# Patient Record
Sex: Male | Born: 1940 | Race: Black or African American | Hispanic: No | Marital: Married | Smoking: Former smoker
Health system: Southern US, Community
[De-identification: ages and names within clinical notes are randomized; demographics above are authoritative.]

## PROBLEM LIST (undated history)

## (undated) ENCOUNTER — Emergency Department (HOSPITAL_COMMUNITY): Admission: EM | Payer: PPO | Source: Home / Self Care

## (undated) DIAGNOSIS — I10 Essential (primary) hypertension: Secondary | ICD-10-CM

## (undated) DIAGNOSIS — E119 Type 2 diabetes mellitus without complications: Secondary | ICD-10-CM

## (undated) DIAGNOSIS — I671 Cerebral aneurysm, nonruptured: Secondary | ICD-10-CM

## (undated) DIAGNOSIS — I509 Heart failure, unspecified: Secondary | ICD-10-CM

## (undated) DIAGNOSIS — F329 Major depressive disorder, single episode, unspecified: Secondary | ICD-10-CM

## (undated) DIAGNOSIS — IMO0002 Reserved for concepts with insufficient information to code with codable children: Secondary | ICD-10-CM

## (undated) DIAGNOSIS — G459 Transient cerebral ischemic attack, unspecified: Secondary | ICD-10-CM

## (undated) DIAGNOSIS — I639 Cerebral infarction, unspecified: Secondary | ICD-10-CM

## (undated) DIAGNOSIS — M1990 Unspecified osteoarthritis, unspecified site: Secondary | ICD-10-CM

## (undated) DIAGNOSIS — G4730 Sleep apnea, unspecified: Secondary | ICD-10-CM

## (undated) DIAGNOSIS — E785 Hyperlipidemia, unspecified: Secondary | ICD-10-CM

## (undated) DIAGNOSIS — F32A Depression, unspecified: Secondary | ICD-10-CM

## (undated) HISTORY — DX: Reserved for concepts with insufficient information to code with codable children: IMO0002

## (undated) HISTORY — DX: Major depressive disorder, single episode, unspecified: F32.9

## (undated) HISTORY — DX: Depression, unspecified: F32.A

## (undated) HISTORY — DX: Cerebral aneurysm, nonruptured: I67.1

## (undated) HISTORY — DX: Hyperlipidemia, unspecified: E78.5

## (undated) HISTORY — DX: Heart failure, unspecified: I50.9

## (undated) HISTORY — PX: KNEE ARTHROSCOPY: SUR90

## (undated) HISTORY — PX: OTHER SURGICAL HISTORY: SHX169

## (undated) HISTORY — PX: SHOULDER ARTHROSCOPY W/ ROTATOR CUFF REPAIR: SHX2400

## (undated) HISTORY — DX: Transient cerebral ischemic attack, unspecified: G45.9

## (undated) HISTORY — DX: Unspecified osteoarthritis, unspecified site: M19.90

---

## 2001-04-22 ENCOUNTER — Ambulatory Visit (HOSPITAL_COMMUNITY): Admission: RE | Admit: 2001-04-22 | Discharge: 2001-04-22 | Payer: Self-pay | Admitting: Internal Medicine

## 2002-11-17 ENCOUNTER — Ambulatory Visit (HOSPITAL_COMMUNITY): Admission: RE | Admit: 2002-11-17 | Discharge: 2002-11-17 | Payer: Self-pay | Admitting: Internal Medicine

## 2005-04-13 ENCOUNTER — Emergency Department (HOSPITAL_COMMUNITY): Admission: EM | Admit: 2005-04-13 | Discharge: 2005-04-13 | Payer: Self-pay | Admitting: Emergency Medicine

## 2005-06-07 ENCOUNTER — Ambulatory Visit: Payer: Self-pay | Admitting: Internal Medicine

## 2005-06-08 ENCOUNTER — Ambulatory Visit: Payer: Self-pay | Admitting: Cardiology

## 2005-06-11 ENCOUNTER — Ambulatory Visit: Payer: Self-pay | Admitting: Internal Medicine

## 2005-06-22 ENCOUNTER — Ambulatory Visit: Payer: Self-pay

## 2005-07-06 ENCOUNTER — Ambulatory Visit: Payer: Self-pay | Admitting: Internal Medicine

## 2005-07-13 ENCOUNTER — Ambulatory Visit: Payer: Self-pay | Admitting: Cardiology

## 2005-07-20 ENCOUNTER — Ambulatory Visit: Payer: Self-pay | Admitting: Pulmonary Disease

## 2005-07-27 ENCOUNTER — Encounter: Admission: RE | Admit: 2005-07-27 | Discharge: 2005-07-27 | Payer: Self-pay | Admitting: Internal Medicine

## 2005-07-29 ENCOUNTER — Ambulatory Visit (HOSPITAL_BASED_OUTPATIENT_CLINIC_OR_DEPARTMENT_OTHER): Admission: RE | Admit: 2005-07-29 | Discharge: 2005-07-29 | Payer: Self-pay | Admitting: Pulmonary Disease

## 2005-07-31 ENCOUNTER — Ambulatory Visit: Payer: Self-pay | Admitting: Pulmonary Disease

## 2005-08-03 ENCOUNTER — Ambulatory Visit: Payer: Self-pay | Admitting: Internal Medicine

## 2005-08-06 ENCOUNTER — Ambulatory Visit: Payer: Self-pay | Admitting: Pulmonary Disease

## 2005-08-14 ENCOUNTER — Ambulatory Visit: Payer: Self-pay | Admitting: Internal Medicine

## 2005-08-14 ENCOUNTER — Ambulatory Visit (HOSPITAL_COMMUNITY): Admission: RE | Admit: 2005-08-14 | Discharge: 2005-08-14 | Payer: Self-pay | Admitting: Internal Medicine

## 2005-08-17 ENCOUNTER — Ambulatory Visit (HOSPITAL_BASED_OUTPATIENT_CLINIC_OR_DEPARTMENT_OTHER): Admission: RE | Admit: 2005-08-17 | Discharge: 2005-08-17 | Payer: Self-pay | Admitting: Pulmonary Disease

## 2005-09-21 ENCOUNTER — Ambulatory Visit: Payer: Self-pay | Admitting: Internal Medicine

## 2005-09-24 ENCOUNTER — Ambulatory Visit: Payer: Self-pay | Admitting: Pulmonary Disease

## 2005-10-19 ENCOUNTER — Ambulatory Visit: Payer: Self-pay | Admitting: Internal Medicine

## 2005-11-09 ENCOUNTER — Ambulatory Visit: Payer: Self-pay | Admitting: Pulmonary Disease

## 2005-11-09 ENCOUNTER — Ambulatory Visit: Payer: Self-pay | Admitting: Internal Medicine

## 2005-11-16 ENCOUNTER — Ambulatory Visit: Payer: Self-pay | Admitting: Internal Medicine

## 2006-01-02 ENCOUNTER — Ambulatory Visit: Payer: Self-pay | Admitting: Endocrinology

## 2006-01-04 ENCOUNTER — Ambulatory Visit (HOSPITAL_COMMUNITY): Admission: RE | Admit: 2006-01-04 | Discharge: 2006-01-04 | Payer: Self-pay | Admitting: Endocrinology

## 2006-01-10 ENCOUNTER — Emergency Department (HOSPITAL_COMMUNITY): Admission: EM | Admit: 2006-01-10 | Discharge: 2006-01-11 | Payer: Self-pay | Admitting: Emergency Medicine

## 2006-01-22 ENCOUNTER — Ambulatory Visit: Payer: Self-pay | Admitting: Internal Medicine

## 2006-02-12 ENCOUNTER — Ambulatory Visit: Payer: Self-pay | Admitting: Internal Medicine

## 2006-02-22 ENCOUNTER — Ambulatory Visit: Payer: Self-pay | Admitting: Pulmonary Disease

## 2006-04-16 ENCOUNTER — Ambulatory Visit: Payer: Self-pay | Admitting: Internal Medicine

## 2006-04-16 ENCOUNTER — Inpatient Hospital Stay (HOSPITAL_COMMUNITY): Admission: AD | Admit: 2006-04-16 | Discharge: 2006-04-19 | Payer: Self-pay | Admitting: Internal Medicine

## 2006-04-18 ENCOUNTER — Encounter (INDEPENDENT_AMBULATORY_CARE_PROVIDER_SITE_OTHER): Payer: Self-pay | Admitting: *Deleted

## 2006-04-22 ENCOUNTER — Emergency Department (HOSPITAL_COMMUNITY): Admission: EM | Admit: 2006-04-22 | Discharge: 2006-04-22 | Payer: Self-pay | Admitting: Emergency Medicine

## 2006-04-25 ENCOUNTER — Ambulatory Visit: Payer: Self-pay | Admitting: Hospitalist

## 2006-05-01 ENCOUNTER — Ambulatory Visit: Payer: Self-pay | Admitting: Internal Medicine

## 2006-05-23 ENCOUNTER — Ambulatory Visit: Payer: Self-pay | Admitting: Internal Medicine

## 2006-06-11 ENCOUNTER — Ambulatory Visit: Payer: Self-pay | Admitting: Hospitalist

## 2006-06-11 ENCOUNTER — Ambulatory Visit (HOSPITAL_COMMUNITY): Admission: RE | Admit: 2006-06-11 | Discharge: 2006-06-11 | Payer: Self-pay | Admitting: Hospitalist

## 2006-06-21 ENCOUNTER — Ambulatory Visit: Payer: Self-pay | Admitting: Hospitalist

## 2006-07-05 ENCOUNTER — Ambulatory Visit (HOSPITAL_COMMUNITY): Admission: RE | Admit: 2006-07-05 | Discharge: 2006-07-05 | Payer: Self-pay | Admitting: Internal Medicine

## 2006-07-16 ENCOUNTER — Ambulatory Visit: Payer: Self-pay | Admitting: Internal Medicine

## 2006-07-19 ENCOUNTER — Ambulatory Visit (HOSPITAL_COMMUNITY): Admission: RE | Admit: 2006-07-19 | Discharge: 2006-07-19 | Payer: Self-pay | Admitting: Internal Medicine

## 2006-07-23 ENCOUNTER — Ambulatory Visit: Payer: Self-pay | Admitting: Internal Medicine

## 2006-08-28 ENCOUNTER — Ambulatory Visit: Payer: Self-pay | Admitting: Hospitalist

## 2006-08-28 ENCOUNTER — Encounter (INDEPENDENT_AMBULATORY_CARE_PROVIDER_SITE_OTHER): Payer: Self-pay | Admitting: Internal Medicine

## 2006-08-28 LAB — CONVERTED CEMR LAB
BUN: 13 mg/dL (ref 6–23)
Calcium: 9.6 mg/dL (ref 8.4–10.5)
Creatinine, Ser: 1.08 mg/dL (ref 0.40–1.50)
Glucose, Bld: 183 mg/dL — ABNORMAL HIGH (ref 70–99)
Potassium: 4.3 meq/L (ref 3.5–5.3)

## 2006-09-02 ENCOUNTER — Encounter (INDEPENDENT_AMBULATORY_CARE_PROVIDER_SITE_OTHER): Payer: Self-pay | Admitting: Internal Medicine

## 2006-09-23 DIAGNOSIS — M545 Low back pain, unspecified: Secondary | ICD-10-CM | POA: Insufficient documentation

## 2006-09-23 DIAGNOSIS — E1149 Type 2 diabetes mellitus with other diabetic neurological complication: Secondary | ICD-10-CM | POA: Insufficient documentation

## 2006-09-23 DIAGNOSIS — I10 Essential (primary) hypertension: Secondary | ICD-10-CM

## 2006-09-30 ENCOUNTER — Ambulatory Visit: Payer: Self-pay | Admitting: Internal Medicine

## 2006-10-01 ENCOUNTER — Telehealth (INDEPENDENT_AMBULATORY_CARE_PROVIDER_SITE_OTHER): Payer: Self-pay | Admitting: Internal Medicine

## 2006-10-14 ENCOUNTER — Encounter (INDEPENDENT_AMBULATORY_CARE_PROVIDER_SITE_OTHER): Payer: Self-pay | Admitting: Internal Medicine

## 2006-10-14 ENCOUNTER — Ambulatory Visit: Payer: Self-pay | Admitting: Internal Medicine

## 2006-10-14 LAB — CONVERTED CEMR LAB
Calcium: 9.4 mg/dL (ref 8.4–10.5)
Glucose, Bld: 201 mg/dL — ABNORMAL HIGH (ref 70–99)
Potassium: 4.4 meq/L (ref 3.5–5.3)
Sodium: 146 meq/L — ABNORMAL HIGH (ref 135–145)

## 2006-10-16 DIAGNOSIS — E269 Hyperaldosteronism, unspecified: Secondary | ICD-10-CM | POA: Insufficient documentation

## 2006-10-16 DIAGNOSIS — G4733 Obstructive sleep apnea (adult) (pediatric): Secondary | ICD-10-CM

## 2006-10-28 ENCOUNTER — Ambulatory Visit: Payer: Self-pay | Admitting: Internal Medicine

## 2006-10-28 DIAGNOSIS — K219 Gastro-esophageal reflux disease without esophagitis: Secondary | ICD-10-CM

## 2006-12-02 ENCOUNTER — Encounter (INDEPENDENT_AMBULATORY_CARE_PROVIDER_SITE_OTHER): Payer: Self-pay | Admitting: Internal Medicine

## 2006-12-02 ENCOUNTER — Emergency Department (HOSPITAL_COMMUNITY): Admission: EM | Admit: 2006-12-02 | Discharge: 2006-12-02 | Payer: Self-pay | Admitting: Emergency Medicine

## 2007-01-14 ENCOUNTER — Encounter (INDEPENDENT_AMBULATORY_CARE_PROVIDER_SITE_OTHER): Payer: Self-pay | Admitting: Internal Medicine

## 2007-01-14 ENCOUNTER — Ambulatory Visit: Payer: Self-pay | Admitting: Internal Medicine

## 2007-01-14 DIAGNOSIS — R609 Edema, unspecified: Secondary | ICD-10-CM

## 2007-01-14 LAB — CONVERTED CEMR LAB
Blood Glucose, Fingerstick: 155
Hgb A1c MFr Bld: 6.9 %

## 2007-01-21 ENCOUNTER — Encounter (INDEPENDENT_AMBULATORY_CARE_PROVIDER_SITE_OTHER): Payer: Self-pay | Admitting: Internal Medicine

## 2007-01-21 LAB — CONVERTED CEMR LAB
AST: 12 units/L (ref 0–37)
Albumin: 4.2 g/dL (ref 3.5–5.2)
BUN: 20 mg/dL (ref 6–23)
Basophils Relative: 1 % (ref 0–1)
CO2: 27 meq/L (ref 19–32)
Calcium: 9.5 mg/dL (ref 8.4–10.5)
Chloride: 104 meq/L (ref 96–112)
Creatinine, Ser: 1.12 mg/dL (ref 0.40–1.50)
Hemoglobin, Urine: NEGATIVE
Hemoglobin: 13.4 g/dL (ref 13.0–17.0)
Ketones, ur: NEGATIVE mg/dL
Lymphocytes Relative: 37 % (ref 12–46)
Lymphs Abs: 2.6 10*3/uL (ref 0.7–3.3)
Monocytes Absolute: 0.6 10*3/uL (ref 0.2–0.7)
Monocytes Relative: 8 % (ref 3–11)
Neutro Abs: 3.7 10*3/uL (ref 1.7–7.7)
Neutrophils Relative %: 52 % (ref 43–77)
PSA: 2.21 ng/mL (ref 0.10–4.00)
Potassium: 4.2 meq/L (ref 3.5–5.3)
Protein, ur: NEGATIVE mg/dL
RBC: 5.5 M/uL (ref 4.22–5.81)
TSH: 2.137 microintl units/mL (ref 0.350–5.50)
Urine Glucose: NEGATIVE mg/dL
WBC: 7.1 10*3/uL (ref 4.0–10.5)
pH: 6 (ref 5.0–8.0)

## 2007-01-30 ENCOUNTER — Encounter (INDEPENDENT_AMBULATORY_CARE_PROVIDER_SITE_OTHER): Payer: Self-pay | Admitting: Internal Medicine

## 2007-01-30 ENCOUNTER — Ambulatory Visit (HOSPITAL_COMMUNITY): Admission: RE | Admit: 2007-01-30 | Discharge: 2007-01-30 | Payer: Self-pay | Admitting: Internal Medicine

## 2007-01-30 ENCOUNTER — Ambulatory Visit: Payer: Self-pay | Admitting: Internal Medicine

## 2007-01-30 DIAGNOSIS — R5383 Other fatigue: Secondary | ICD-10-CM

## 2007-01-30 DIAGNOSIS — R5381 Other malaise: Secondary | ICD-10-CM | POA: Insufficient documentation

## 2007-01-31 LAB — CONVERTED CEMR LAB
Creatinine, Urine: 185.5 mg/dL
Microalb Creat Ratio: 33.9 mg/g — ABNORMAL HIGH (ref 0.0–30.0)

## 2007-02-04 ENCOUNTER — Encounter: Admission: RE | Admit: 2007-02-04 | Discharge: 2007-02-14 | Payer: Self-pay | Admitting: Internal Medicine

## 2007-02-21 ENCOUNTER — Encounter (INDEPENDENT_AMBULATORY_CARE_PROVIDER_SITE_OTHER): Payer: Self-pay | Admitting: Internal Medicine

## 2007-02-21 ENCOUNTER — Ambulatory Visit: Payer: Self-pay | Admitting: Internal Medicine

## 2007-03-19 ENCOUNTER — Telehealth (INDEPENDENT_AMBULATORY_CARE_PROVIDER_SITE_OTHER): Payer: Self-pay | Admitting: Pharmacy Technician

## 2007-03-28 ENCOUNTER — Ambulatory Visit: Payer: Self-pay | Admitting: Internal Medicine

## 2007-03-28 DIAGNOSIS — B353 Tinea pedis: Secondary | ICD-10-CM | POA: Insufficient documentation

## 2007-03-28 LAB — CONVERTED CEMR LAB
HDL: 33 mg/dL
LDL Cholesterol: 73 mg/dL
Triglyceride fasting, serum: 133 mg/dL

## 2007-04-08 ENCOUNTER — Encounter: Payer: Self-pay | Admitting: Licensed Clinical Social Worker

## 2007-05-06 ENCOUNTER — Telehealth (INDEPENDENT_AMBULATORY_CARE_PROVIDER_SITE_OTHER): Payer: Self-pay | Admitting: Pharmacy Technician

## 2007-05-29 ENCOUNTER — Ambulatory Visit: Payer: Self-pay | Admitting: Internal Medicine

## 2007-06-06 ENCOUNTER — Ambulatory Visit (HOSPITAL_COMMUNITY): Admission: RE | Admit: 2007-06-06 | Discharge: 2007-06-06 | Payer: Self-pay | Admitting: Internal Medicine

## 2007-06-06 ENCOUNTER — Encounter (INDEPENDENT_AMBULATORY_CARE_PROVIDER_SITE_OTHER): Payer: Self-pay | Admitting: Internal Medicine

## 2007-06-06 ENCOUNTER — Ambulatory Visit: Payer: Self-pay | Admitting: Vascular Surgery

## 2007-06-13 ENCOUNTER — Ambulatory Visit: Payer: Self-pay | Admitting: Internal Medicine

## 2007-06-13 ENCOUNTER — Encounter (INDEPENDENT_AMBULATORY_CARE_PROVIDER_SITE_OTHER): Payer: Self-pay | Admitting: *Deleted

## 2007-06-13 LAB — CONVERTED CEMR LAB
BUN: 15 mg/dL (ref 6–23)
CO2: 29 meq/L (ref 19–32)
Chloride: 104 meq/L (ref 96–112)
Glucose, Bld: 188 mg/dL — ABNORMAL HIGH (ref 70–99)
Potassium: 4.2 meq/L (ref 3.5–5.3)
Sodium: 143 meq/L (ref 135–145)

## 2007-06-20 ENCOUNTER — Ambulatory Visit: Payer: Self-pay | Admitting: Internal Medicine

## 2007-06-23 ENCOUNTER — Telehealth (INDEPENDENT_AMBULATORY_CARE_PROVIDER_SITE_OTHER): Payer: Self-pay | Admitting: Pharmacy Technician

## 2007-07-07 ENCOUNTER — Telehealth (INDEPENDENT_AMBULATORY_CARE_PROVIDER_SITE_OTHER): Payer: Self-pay | Admitting: Pharmacy Technician

## 2007-07-11 ENCOUNTER — Encounter (INDEPENDENT_AMBULATORY_CARE_PROVIDER_SITE_OTHER): Payer: Self-pay | Admitting: Internal Medicine

## 2007-07-11 ENCOUNTER — Telehealth (INDEPENDENT_AMBULATORY_CARE_PROVIDER_SITE_OTHER): Payer: Self-pay | Admitting: Internal Medicine

## 2007-07-11 ENCOUNTER — Ambulatory Visit: Payer: Self-pay | Admitting: Internal Medicine

## 2007-07-11 LAB — CONVERTED CEMR LAB
BUN: 19 mg/dL (ref 6–23)
CO2: 31 meq/L (ref 19–32)
Calcium: 9.1 mg/dL (ref 8.4–10.5)
Creatinine, Ser: 1.31 mg/dL (ref 0.40–1.50)
Glucose, Bld: 164 mg/dL — ABNORMAL HIGH (ref 70–99)

## 2007-07-18 ENCOUNTER — Ambulatory Visit: Payer: Self-pay | Admitting: *Deleted

## 2007-07-18 ENCOUNTER — Encounter (INDEPENDENT_AMBULATORY_CARE_PROVIDER_SITE_OTHER): Payer: Self-pay | Admitting: Infectious Diseases

## 2007-07-18 DIAGNOSIS — R42 Dizziness and giddiness: Secondary | ICD-10-CM

## 2007-07-19 ENCOUNTER — Encounter: Payer: Self-pay | Admitting: *Deleted

## 2007-07-24 ENCOUNTER — Encounter (INDEPENDENT_AMBULATORY_CARE_PROVIDER_SITE_OTHER): Payer: Self-pay | Admitting: Infectious Diseases

## 2007-07-24 LAB — CONVERTED CEMR LAB
BUN: 24 mg/dL — ABNORMAL HIGH (ref 6–23)
Basophils Absolute: 0 10*3/uL (ref 0.0–0.1)
Basophils Relative: 1 % (ref 0–1)
Eosinophils Absolute: 0.2 10*3/uL (ref 0.0–0.7)
MCHC: 29.3 g/dL — ABNORMAL LOW (ref 30.0–36.0)
MCV: 86.1 fL (ref 78.0–100.0)
Monocytes Relative: 8 % (ref 3–11)
Neutrophils Relative %: 55 % (ref 43–77)
Platelets: 244 10*3/uL (ref 150–400)
Potassium: 4.8 meq/L (ref 3.5–5.3)
RBC: 5.75 M/uL (ref 4.22–5.81)
RDW: 15.4 % — ABNORMAL HIGH (ref 11.5–14.0)

## 2007-07-25 ENCOUNTER — Encounter: Admission: RE | Admit: 2007-07-25 | Discharge: 2007-07-25 | Payer: Self-pay | Admitting: Infectious Diseases

## 2007-07-25 ENCOUNTER — Ambulatory Visit: Payer: Self-pay | Admitting: Infectious Diseases

## 2007-07-25 ENCOUNTER — Encounter (INDEPENDENT_AMBULATORY_CARE_PROVIDER_SITE_OTHER): Payer: Self-pay | Admitting: Internal Medicine

## 2007-07-25 ENCOUNTER — Ambulatory Visit: Payer: Self-pay | Admitting: Cardiology

## 2007-07-25 ENCOUNTER — Inpatient Hospital Stay (HOSPITAL_COMMUNITY): Admission: RE | Admit: 2007-07-25 | Discharge: 2007-07-28 | Payer: Self-pay | Admitting: Infectious Diseases

## 2007-07-25 ENCOUNTER — Ambulatory Visit: Payer: Self-pay | Admitting: Internal Medicine

## 2007-07-28 ENCOUNTER — Ambulatory Visit (HOSPITAL_COMMUNITY): Admission: RE | Admit: 2007-07-28 | Discharge: 2007-07-28 | Payer: Self-pay | Admitting: Infectious Diseases

## 2007-07-28 ENCOUNTER — Encounter (INDEPENDENT_AMBULATORY_CARE_PROVIDER_SITE_OTHER): Payer: Self-pay | Admitting: Internal Medicine

## 2007-08-11 ENCOUNTER — Encounter: Admission: RE | Admit: 2007-08-11 | Discharge: 2007-09-09 | Payer: Self-pay | Admitting: Internal Medicine

## 2007-08-13 ENCOUNTER — Ambulatory Visit: Payer: Self-pay | Admitting: Infectious Diseases

## 2007-08-13 LAB — CONVERTED CEMR LAB: Blood Glucose, Fingerstick: 330

## 2007-08-22 ENCOUNTER — Encounter (INDEPENDENT_AMBULATORY_CARE_PROVIDER_SITE_OTHER): Payer: Self-pay | Admitting: Infectious Diseases

## 2007-08-22 ENCOUNTER — Ambulatory Visit: Payer: Self-pay | Admitting: *Deleted

## 2007-08-27 LAB — CONVERTED CEMR LAB
CO2: 30 meq/L (ref 19–32)
Calcium: 9.6 mg/dL (ref 8.4–10.5)
Sodium: 141 meq/L (ref 135–145)

## 2007-09-05 ENCOUNTER — Encounter (INDEPENDENT_AMBULATORY_CARE_PROVIDER_SITE_OTHER): Payer: Self-pay | Admitting: Internal Medicine

## 2007-09-05 ENCOUNTER — Ambulatory Visit: Payer: Self-pay | Admitting: Internal Medicine

## 2007-09-07 LAB — CONVERTED CEMR LAB
Microalb Creat Ratio: 8.1 mg/g (ref 0.0–30.0)
Microalb, Ur: 0.72 mg/dL (ref 0.00–1.89)

## 2007-09-09 LAB — CONVERTED CEMR LAB: OCCULT 1: NEGATIVE

## 2007-09-17 ENCOUNTER — Telehealth (INDEPENDENT_AMBULATORY_CARE_PROVIDER_SITE_OTHER): Payer: Self-pay | Admitting: Internal Medicine

## 2007-09-19 ENCOUNTER — Encounter (INDEPENDENT_AMBULATORY_CARE_PROVIDER_SITE_OTHER): Payer: Self-pay | Admitting: Internal Medicine

## 2007-09-19 ENCOUNTER — Ambulatory Visit: Payer: Self-pay | Admitting: Hospitalist

## 2007-09-20 LAB — CONVERTED CEMR LAB
BUN: 25 mg/dL — ABNORMAL HIGH (ref 6–23)
CO2: 27 meq/L (ref 19–32)
Chloride: 104 meq/L (ref 96–112)
Creatinine, Ser: 1.5 mg/dL (ref 0.40–1.50)

## 2007-10-16 DIAGNOSIS — I639 Cerebral infarction, unspecified: Secondary | ICD-10-CM

## 2007-10-16 HISTORY — DX: Cerebral infarction, unspecified: I63.9

## 2007-11-03 ENCOUNTER — Encounter (INDEPENDENT_AMBULATORY_CARE_PROVIDER_SITE_OTHER): Payer: Self-pay | Admitting: Internal Medicine

## 2007-11-06 ENCOUNTER — Telehealth (INDEPENDENT_AMBULATORY_CARE_PROVIDER_SITE_OTHER): Payer: Self-pay | Admitting: Pharmacy Technician

## 2007-11-10 ENCOUNTER — Telehealth (INDEPENDENT_AMBULATORY_CARE_PROVIDER_SITE_OTHER): Payer: Self-pay | Admitting: Internal Medicine

## 2007-12-09 ENCOUNTER — Telehealth (INDEPENDENT_AMBULATORY_CARE_PROVIDER_SITE_OTHER): Payer: Self-pay | Admitting: Internal Medicine

## 2008-01-02 ENCOUNTER — Encounter (INDEPENDENT_AMBULATORY_CARE_PROVIDER_SITE_OTHER): Payer: Self-pay | Admitting: Internal Medicine

## 2008-01-09 ENCOUNTER — Encounter (INDEPENDENT_AMBULATORY_CARE_PROVIDER_SITE_OTHER): Payer: Self-pay | Admitting: Internal Medicine

## 2008-01-09 ENCOUNTER — Ambulatory Visit: Payer: Self-pay | Admitting: Hospitalist

## 2008-01-16 ENCOUNTER — Telehealth (INDEPENDENT_AMBULATORY_CARE_PROVIDER_SITE_OTHER): Payer: Self-pay | Admitting: Internal Medicine

## 2008-01-31 ENCOUNTER — Emergency Department (HOSPITAL_COMMUNITY): Admission: EM | Admit: 2008-01-31 | Discharge: 2008-02-01 | Payer: Self-pay | Admitting: Emergency Medicine

## 2008-02-10 ENCOUNTER — Ambulatory Visit: Payer: Self-pay | Admitting: Internal Medicine

## 2008-02-10 ENCOUNTER — Encounter (INDEPENDENT_AMBULATORY_CARE_PROVIDER_SITE_OTHER): Payer: Self-pay | Admitting: Internal Medicine

## 2008-02-10 LAB — CONVERTED CEMR LAB: Blood Glucose, Fingerstick: 119

## 2008-02-12 DIAGNOSIS — E785 Hyperlipidemia, unspecified: Secondary | ICD-10-CM

## 2008-02-12 DIAGNOSIS — E1169 Type 2 diabetes mellitus with other specified complication: Secondary | ICD-10-CM

## 2008-02-12 LAB — CONVERTED CEMR LAB
ALT: 20 U/L
AST: 13 U/L
Albumin: 4.1 g/dL
Alkaline Phosphatase: 61 U/L
BUN: 18 mg/dL
CO2: 28 meq/L
Calcium: 9.5 mg/dL
Chloride: 107 meq/L
Cholesterol: 181 mg/dL
Creatinine, Ser: 1.33 mg/dL
Glucose, Bld: 114 mg/dL — ABNORMAL HIGH
HCT: 43.1 %
HDL: 32 mg/dL — ABNORMAL LOW
Hemoglobin: 12.8 g/dL — ABNORMAL LOW
LDL Cholesterol: 125 mg/dL — ABNORMAL HIGH
MCHC: 29.7 g/dL — ABNORMAL LOW
MCV: 86.9 fL
Platelets: 259 10*3/uL
Potassium: 4.9 meq/L
RBC: 4.96 M/uL
RDW: 15.3 %
Sodium: 146 meq/L — ABNORMAL HIGH
Total Bilirubin: 0.5 mg/dL
Total CHOL/HDL Ratio: 5.7
Total Protein: 7.4 g/dL
Triglycerides: 122 mg/dL
VLDL: 24 mg/dL
WBC: 6.9 10*3/uL

## 2008-02-13 ENCOUNTER — Telehealth (INDEPENDENT_AMBULATORY_CARE_PROVIDER_SITE_OTHER): Payer: Self-pay | Admitting: *Deleted

## 2008-02-13 ENCOUNTER — Encounter (INDEPENDENT_AMBULATORY_CARE_PROVIDER_SITE_OTHER): Payer: Self-pay | Admitting: Internal Medicine

## 2008-03-02 ENCOUNTER — Telehealth (INDEPENDENT_AMBULATORY_CARE_PROVIDER_SITE_OTHER): Payer: Self-pay | Admitting: Internal Medicine

## 2008-03-12 ENCOUNTER — Ambulatory Visit: Payer: Self-pay | Admitting: *Deleted

## 2008-03-12 ENCOUNTER — Encounter (INDEPENDENT_AMBULATORY_CARE_PROVIDER_SITE_OTHER): Payer: Self-pay | Admitting: Internal Medicine

## 2008-03-20 ENCOUNTER — Ambulatory Visit: Payer: Self-pay | Admitting: Hospitalist

## 2008-03-20 ENCOUNTER — Inpatient Hospital Stay (HOSPITAL_COMMUNITY): Admission: EM | Admit: 2008-03-20 | Discharge: 2008-03-25 | Payer: Self-pay | Admitting: Emergency Medicine

## 2008-03-22 ENCOUNTER — Encounter (INDEPENDENT_AMBULATORY_CARE_PROVIDER_SITE_OTHER): Payer: Self-pay | Admitting: Emergency Medicine

## 2008-03-22 ENCOUNTER — Encounter (INDEPENDENT_AMBULATORY_CARE_PROVIDER_SITE_OTHER): Payer: Self-pay | Admitting: Internal Medicine

## 2008-03-22 ENCOUNTER — Ambulatory Visit: Payer: Self-pay | Admitting: Surgery

## 2008-03-22 ENCOUNTER — Encounter (INDEPENDENT_AMBULATORY_CARE_PROVIDER_SITE_OTHER): Payer: Self-pay | Admitting: Hospitalist

## 2008-04-01 ENCOUNTER — Encounter: Payer: Self-pay | Admitting: Internal Medicine

## 2008-04-01 ENCOUNTER — Ambulatory Visit: Payer: Self-pay | Admitting: Internal Medicine

## 2008-04-01 LAB — CONVERTED CEMR LAB
BUN: 24 mg/dL — ABNORMAL HIGH (ref 6–23)
Chloride: 103 meq/L (ref 96–112)
Hgb A1c MFr Bld: 7.7 %
Potassium: 4.3 meq/L (ref 3.5–5.3)

## 2008-04-02 ENCOUNTER — Encounter (INDEPENDENT_AMBULATORY_CARE_PROVIDER_SITE_OTHER): Payer: Self-pay | Admitting: Internal Medicine

## 2008-04-06 ENCOUNTER — Encounter (INDEPENDENT_AMBULATORY_CARE_PROVIDER_SITE_OTHER): Payer: Self-pay | Admitting: Internal Medicine

## 2008-04-23 ENCOUNTER — Ambulatory Visit (HOSPITAL_COMMUNITY): Admission: RE | Admit: 2008-04-23 | Discharge: 2008-04-23 | Payer: Self-pay | Admitting: Neurosurgery

## 2008-05-12 ENCOUNTER — Encounter (INDEPENDENT_AMBULATORY_CARE_PROVIDER_SITE_OTHER): Payer: Self-pay | Admitting: Internal Medicine

## 2008-05-15 DIAGNOSIS — I671 Cerebral aneurysm, nonruptured: Secondary | ICD-10-CM

## 2008-05-15 HISTORY — PX: CRANIOTOMY: SHX93

## 2008-05-15 HISTORY — DX: Cerebral aneurysm, nonruptured: I67.1

## 2008-05-27 ENCOUNTER — Inpatient Hospital Stay (HOSPITAL_COMMUNITY): Admission: RE | Admit: 2008-05-27 | Discharge: 2008-06-03 | Payer: Self-pay | Admitting: Neurosurgery

## 2008-06-02 ENCOUNTER — Ambulatory Visit: Payer: Self-pay | Admitting: Physical Medicine & Rehabilitation

## 2008-06-03 ENCOUNTER — Inpatient Hospital Stay (HOSPITAL_COMMUNITY)
Admission: RE | Admit: 2008-06-03 | Discharge: 2008-06-12 | Payer: Self-pay | Admitting: Physical Medicine & Rehabilitation

## 2008-06-12 ENCOUNTER — Inpatient Hospital Stay (HOSPITAL_COMMUNITY)
Admission: EM | Admit: 2008-06-12 | Discharge: 2008-06-18 | Payer: Self-pay | Admitting: Physical Medicine & Rehabilitation

## 2008-06-12 ENCOUNTER — Ambulatory Visit: Payer: Self-pay | Admitting: Infectious Diseases

## 2008-06-22 ENCOUNTER — Inpatient Hospital Stay (HOSPITAL_COMMUNITY): Admission: EM | Admit: 2008-06-22 | Discharge: 2008-06-25 | Payer: Self-pay | Admitting: Emergency Medicine

## 2008-08-02 ENCOUNTER — Encounter (INDEPENDENT_AMBULATORY_CARE_PROVIDER_SITE_OTHER): Payer: Self-pay | Admitting: Internal Medicine

## 2008-08-02 ENCOUNTER — Encounter: Admission: RE | Admit: 2008-08-02 | Discharge: 2008-08-02 | Payer: Self-pay | Admitting: Neurosurgery

## 2008-08-18 ENCOUNTER — Encounter (INDEPENDENT_AMBULATORY_CARE_PROVIDER_SITE_OTHER): Payer: Self-pay | Admitting: Internal Medicine

## 2008-08-18 ENCOUNTER — Ambulatory Visit: Payer: Self-pay | Admitting: Infectious Diseases

## 2008-08-18 DIAGNOSIS — I6350 Cerebral infarction due to unspecified occlusion or stenosis of unspecified cerebral artery: Secondary | ICD-10-CM | POA: Insufficient documentation

## 2008-08-18 LAB — CONVERTED CEMR LAB: Blood Glucose, Fingerstick: 332

## 2008-08-19 LAB — CONVERTED CEMR LAB
BUN: 18 mg/dL (ref 6–23)
Basophils Absolute: 0.1 10*3/uL (ref 0.0–0.1)
CO2: 29 meq/L (ref 19–32)
Calcium: 9.3 mg/dL (ref 8.4–10.5)
Creatinine, Ser: 1.12 mg/dL (ref 0.40–1.50)
Eosinophils Relative: 3 % (ref 0–5)
Glucose, Bld: 355 mg/dL — ABNORMAL HIGH (ref 70–99)
HCT: 41 % (ref 39.0–52.0)
Hemoglobin: 12.3 g/dL — ABNORMAL LOW (ref 13.0–17.0)
Lymphocytes Relative: 29 % (ref 12–46)
Lymphs Abs: 2.3 10*3/uL (ref 0.7–4.0)
Monocytes Absolute: 0.6 10*3/uL (ref 0.1–1.0)
Monocytes Relative: 8 % (ref 3–12)
RBC: 4.91 M/uL (ref 4.22–5.81)
RDW: 15.7 % — ABNORMAL HIGH (ref 11.5–15.5)
Total Bilirubin: 0.5 mg/dL (ref 0.3–1.2)
Vit D, 1,25-Dihydroxy: 12 — ABNORMAL LOW (ref 30–89)

## 2008-09-01 ENCOUNTER — Ambulatory Visit: Payer: Self-pay | Admitting: *Deleted

## 2008-09-01 ENCOUNTER — Encounter (INDEPENDENT_AMBULATORY_CARE_PROVIDER_SITE_OTHER): Payer: Self-pay | Admitting: Internal Medicine

## 2008-09-17 ENCOUNTER — Encounter (INDEPENDENT_AMBULATORY_CARE_PROVIDER_SITE_OTHER): Payer: Self-pay | Admitting: Internal Medicine

## 2008-10-22 ENCOUNTER — Encounter (INDEPENDENT_AMBULATORY_CARE_PROVIDER_SITE_OTHER): Payer: Self-pay | Admitting: Internal Medicine

## 2008-11-09 ENCOUNTER — Ambulatory Visit: Payer: Self-pay | Admitting: Infectious Disease

## 2008-11-09 ENCOUNTER — Encounter (INDEPENDENT_AMBULATORY_CARE_PROVIDER_SITE_OTHER): Payer: Self-pay | Admitting: Internal Medicine

## 2008-11-09 DIAGNOSIS — L989 Disorder of the skin and subcutaneous tissue, unspecified: Secondary | ICD-10-CM | POA: Insufficient documentation

## 2008-11-09 DIAGNOSIS — K5900 Constipation, unspecified: Secondary | ICD-10-CM | POA: Insufficient documentation

## 2008-11-09 LAB — CONVERTED CEMR LAB: Hgb A1c MFr Bld: 9.7 %

## 2008-11-18 ENCOUNTER — Encounter (INDEPENDENT_AMBULATORY_CARE_PROVIDER_SITE_OTHER): Payer: Self-pay | Admitting: Internal Medicine

## 2008-11-29 ENCOUNTER — Encounter (INDEPENDENT_AMBULATORY_CARE_PROVIDER_SITE_OTHER): Payer: Self-pay | Admitting: Internal Medicine

## 2008-11-29 ENCOUNTER — Ambulatory Visit: Payer: Self-pay | Admitting: *Deleted

## 2008-11-29 LAB — CONVERTED CEMR LAB
Chloride: 107 meq/L (ref 96–112)
Potassium: 4.3 meq/L (ref 3.5–5.3)

## 2008-12-28 ENCOUNTER — Ambulatory Visit: Payer: Self-pay | Admitting: Internal Medicine

## 2008-12-28 ENCOUNTER — Encounter (INDEPENDENT_AMBULATORY_CARE_PROVIDER_SITE_OTHER): Payer: Self-pay | Admitting: Internal Medicine

## 2008-12-28 LAB — CONVERTED CEMR LAB: Blood Glucose, Fingerstick: 199

## 2008-12-29 LAB — CONVERTED CEMR LAB
ALT: 13 units/L (ref 0–53)
Albumin: 4 g/dL (ref 3.5–5.2)
Alkaline Phosphatase: 58 units/L (ref 39–117)
CO2: 29 meq/L (ref 19–32)
Cholesterol: 112 mg/dL (ref 0–200)
Glucose, Bld: 140 mg/dL — ABNORMAL HIGH (ref 70–99)
LDL Cholesterol: 58 mg/dL (ref 0–99)
Potassium: 4.7 meq/L (ref 3.5–5.3)
Sodium: 143 meq/L (ref 135–145)
Total Protein: 7.2 g/dL (ref 6.0–8.3)
Triglycerides: 99 mg/dL (ref <150)
VLDL: 20 mg/dL (ref 0–40)

## 2008-12-31 ENCOUNTER — Encounter (INDEPENDENT_AMBULATORY_CARE_PROVIDER_SITE_OTHER): Payer: Self-pay | Admitting: Internal Medicine

## 2009-01-11 ENCOUNTER — Inpatient Hospital Stay (HOSPITAL_COMMUNITY): Admission: EM | Admit: 2009-01-11 | Discharge: 2009-01-13 | Payer: Self-pay | Admitting: Emergency Medicine

## 2009-01-11 ENCOUNTER — Ambulatory Visit: Payer: Self-pay | Admitting: Cardiovascular Disease

## 2009-01-11 ENCOUNTER — Ambulatory Visit: Payer: Self-pay | Admitting: Internal Medicine

## 2009-01-11 ENCOUNTER — Encounter: Payer: Self-pay | Admitting: Internal Medicine

## 2009-01-11 DIAGNOSIS — R079 Chest pain, unspecified: Secondary | ICD-10-CM

## 2009-01-13 ENCOUNTER — Encounter (INDEPENDENT_AMBULATORY_CARE_PROVIDER_SITE_OTHER): Payer: Self-pay | Admitting: *Deleted

## 2009-02-01 ENCOUNTER — Ambulatory Visit: Payer: Self-pay | Admitting: Infectious Disease

## 2009-02-01 ENCOUNTER — Encounter (INDEPENDENT_AMBULATORY_CARE_PROVIDER_SITE_OTHER): Payer: Self-pay | Admitting: Internal Medicine

## 2009-02-01 LAB — CONVERTED CEMR LAB
BUN: 21 mg/dL (ref 6–23)
CO2: 29 meq/L (ref 19–32)
GC Probe Amp, Urine: NEGATIVE
Glucose, Bld: 89 mg/dL (ref 70–99)
Hgb A1c MFr Bld: 7.4 %
Potassium: 4.9 meq/L (ref 3.5–5.3)
Sodium: 145 meq/L (ref 135–145)

## 2009-02-02 ENCOUNTER — Encounter (INDEPENDENT_AMBULATORY_CARE_PROVIDER_SITE_OTHER): Payer: Self-pay | Admitting: Internal Medicine

## 2009-03-01 ENCOUNTER — Telehealth (INDEPENDENT_AMBULATORY_CARE_PROVIDER_SITE_OTHER): Payer: Self-pay | Admitting: Internal Medicine

## 2009-03-02 ENCOUNTER — Encounter (INDEPENDENT_AMBULATORY_CARE_PROVIDER_SITE_OTHER): Payer: Self-pay | Admitting: Internal Medicine

## 2009-03-03 ENCOUNTER — Encounter (INDEPENDENT_AMBULATORY_CARE_PROVIDER_SITE_OTHER): Payer: Self-pay | Admitting: Internal Medicine

## 2009-03-10 ENCOUNTER — Ambulatory Visit: Payer: Self-pay | Admitting: *Deleted

## 2009-03-10 ENCOUNTER — Inpatient Hospital Stay (HOSPITAL_COMMUNITY): Admission: RE | Admit: 2009-03-10 | Discharge: 2009-03-11 | Payer: Self-pay | Admitting: *Deleted

## 2009-03-10 ENCOUNTER — Encounter (INDEPENDENT_AMBULATORY_CARE_PROVIDER_SITE_OTHER): Payer: Self-pay | Admitting: Internal Medicine

## 2009-03-10 ENCOUNTER — Ambulatory Visit: Payer: Self-pay | Admitting: Cardiology

## 2009-03-10 ENCOUNTER — Ambulatory Visit: Payer: Self-pay | Admitting: Internal Medicine

## 2009-03-10 LAB — CONVERTED CEMR LAB
ALT: 21 units/L (ref 0–53)
AST: 19 units/L (ref 0–37)
Alkaline Phosphatase: 72 units/L (ref 39–117)
Basophils Absolute: 0 10*3/uL (ref 0.0–0.1)
Blood Glucose, Fingerstick: 200
Eosinophils Relative: 3 % (ref 0–5)
GFR calc non Af Amer: 56 mL/min — ABNORMAL LOW (ref >60)
HCT: 40.5 % (ref 39.0–52.0)
Hemoglobin: 12.9 g/dL — ABNORMAL LOW (ref 13.0–17.0)
Lymphocytes Relative: 25 % (ref 12–46)
MCHC: 31.8 g/dL (ref 30.0–36.0)
MCV: 80.8 fL (ref 78.0–100.0)
Monocytes Absolute: 0.7 10*3/uL (ref 0.1–1.0)
RDW: 17 % — ABNORMAL HIGH (ref 11.5–15.5)
Sodium: 146 meq/L — ABNORMAL HIGH (ref 135–145)
Total Bilirubin: 1 mg/dL (ref 0.3–1.2)
Total Protein: 6.8 g/dL (ref 6.0–8.3)

## 2009-03-11 ENCOUNTER — Encounter: Payer: Self-pay | Admitting: Internal Medicine

## 2009-03-11 ENCOUNTER — Encounter (INDEPENDENT_AMBULATORY_CARE_PROVIDER_SITE_OTHER): Payer: Self-pay | Admitting: *Deleted

## 2009-03-11 ENCOUNTER — Encounter (INDEPENDENT_AMBULATORY_CARE_PROVIDER_SITE_OTHER): Payer: Self-pay | Admitting: Internal Medicine

## 2009-03-11 ENCOUNTER — Ambulatory Visit: Payer: Self-pay | Admitting: *Deleted

## 2009-03-21 ENCOUNTER — Ambulatory Visit: Payer: Self-pay | Admitting: Internal Medicine

## 2009-03-21 ENCOUNTER — Encounter (INDEPENDENT_AMBULATORY_CARE_PROVIDER_SITE_OTHER): Payer: Self-pay | Admitting: Internal Medicine

## 2009-03-21 LAB — CONVERTED CEMR LAB
BUN: 20 mg/dL (ref 6–23)
Blood Glucose, Fingerstick: 75
Chloride: 103 meq/L (ref 96–112)
GFR calc non Af Amer: 53 mL/min — ABNORMAL LOW (ref >60)
Hgb A1c MFr Bld: 8.4 %
Potassium: 4.4 meq/L (ref 3.5–5.3)

## 2009-04-05 ENCOUNTER — Ambulatory Visit: Payer: Self-pay | Admitting: Internal Medicine

## 2009-04-05 ENCOUNTER — Encounter (INDEPENDENT_AMBULATORY_CARE_PROVIDER_SITE_OTHER): Payer: Self-pay | Admitting: Internal Medicine

## 2009-04-05 LAB — CONVERTED CEMR LAB: Blood Glucose, Fingerstick: 252

## 2009-04-06 LAB — CONVERTED CEMR LAB
Chloride: 103 meq/L (ref 96–112)
GFR calc non Af Amer: 49 mL/min — ABNORMAL LOW (ref >60)
Potassium: 4.5 meq/L (ref 3.5–5.3)

## 2009-04-19 ENCOUNTER — Encounter: Payer: Self-pay | Admitting: Internal Medicine

## 2009-04-19 ENCOUNTER — Ambulatory Visit: Payer: Self-pay | Admitting: Internal Medicine

## 2009-04-19 LAB — CONVERTED CEMR LAB: Hgb A1c MFr Bld: 8.7 %

## 2009-04-20 LAB — CONVERTED CEMR LAB
ALT: 26 units/L (ref 0–53)
AST: 15 units/L (ref 0–37)
Albumin: 3.9 g/dL (ref 3.5–5.2)
Calcium: 9.9 mg/dL (ref 8.4–10.5)
Chloride: 104 meq/L (ref 96–112)
Free T4: 1.06 ng/dL (ref 0.80–1.80)
Potassium: 4.7 meq/L (ref 3.5–5.3)
TSH: 1.257 microintl units/mL (ref 0.350–4.500)

## 2009-05-04 ENCOUNTER — Telehealth: Payer: Self-pay | Admitting: Internal Medicine

## 2009-06-27 ENCOUNTER — Ambulatory Visit: Payer: Self-pay | Admitting: Infectious Diseases

## 2009-06-27 LAB — CONVERTED CEMR LAB: PSA: 2.95 ng/mL (ref 0.10–4.00)

## 2009-07-08 ENCOUNTER — Encounter: Payer: Self-pay | Admitting: Internal Medicine

## 2009-07-23 ENCOUNTER — Ambulatory Visit: Payer: Self-pay | Admitting: Internal Medicine

## 2009-07-23 ENCOUNTER — Encounter: Payer: Self-pay | Admitting: Internal Medicine

## 2009-07-23 ENCOUNTER — Inpatient Hospital Stay (HOSPITAL_COMMUNITY): Admission: EM | Admit: 2009-07-23 | Discharge: 2009-07-26 | Payer: Self-pay | Admitting: Emergency Medicine

## 2009-08-09 ENCOUNTER — Ambulatory Visit: Payer: Self-pay | Admitting: Internal Medicine

## 2009-08-09 LAB — CONVERTED CEMR LAB: Blood Glucose, Fingerstick: 182

## 2009-08-10 ENCOUNTER — Encounter (INDEPENDENT_AMBULATORY_CARE_PROVIDER_SITE_OTHER): Payer: Self-pay | Admitting: Internal Medicine

## 2009-08-10 LAB — CONVERTED CEMR LAB
Albumin: 4.1 g/dL (ref 3.5–5.2)
CO2: 34 meq/L — ABNORMAL HIGH (ref 19–32)
Creatinine, Ser: 1.44 mg/dL (ref 0.40–1.50)
Glucose, Bld: 179 mg/dL — ABNORMAL HIGH (ref 70–99)
Sodium: 143 meq/L (ref 135–145)
Total Bilirubin: 0.6 mg/dL (ref 0.3–1.2)

## 2009-09-06 ENCOUNTER — Encounter (INDEPENDENT_AMBULATORY_CARE_PROVIDER_SITE_OTHER): Payer: Self-pay | Admitting: Dermatology

## 2009-09-06 ENCOUNTER — Ambulatory Visit: Payer: Self-pay | Admitting: Internal Medicine

## 2009-09-27 ENCOUNTER — Encounter: Payer: Self-pay | Admitting: Internal Medicine

## 2009-09-27 ENCOUNTER — Ambulatory Visit: Payer: Self-pay | Admitting: Infectious Diseases

## 2009-09-27 LAB — CONVERTED CEMR LAB: Pro B Natriuretic peptide (BNP): <30 pg/mL (ref 0.0–100.0)

## 2009-10-06 ENCOUNTER — Encounter: Payer: Self-pay | Admitting: Internal Medicine

## 2009-10-06 ENCOUNTER — Ambulatory Visit: Payer: Self-pay | Admitting: Internal Medicine

## 2009-10-20 ENCOUNTER — Encounter (INDEPENDENT_AMBULATORY_CARE_PROVIDER_SITE_OTHER): Payer: Self-pay | Admitting: Dermatology

## 2009-10-20 ENCOUNTER — Ambulatory Visit: Payer: Self-pay | Admitting: Internal Medicine

## 2009-10-20 LAB — CONVERTED CEMR LAB: Blood Glucose, Fingerstick: 187

## 2009-10-21 LAB — CONVERTED CEMR LAB
BUN: 28 mg/dL — ABNORMAL HIGH (ref 6–23)
CO2: 36 meq/L — ABNORMAL HIGH (ref 19–32)
Chloride: 100 meq/L (ref 96–112)
Potassium: 4.2 meq/L (ref 3.5–5.3)

## 2009-10-28 ENCOUNTER — Ambulatory Visit: Payer: Self-pay | Admitting: Internal Medicine

## 2009-10-29 ENCOUNTER — Encounter (INDEPENDENT_AMBULATORY_CARE_PROVIDER_SITE_OTHER): Payer: Self-pay | Admitting: Dermatology

## 2009-10-30 LAB — CONVERTED CEMR LAB
BUN: 26 mg/dL — ABNORMAL HIGH (ref 6–23)
CO2: 29 meq/L (ref 19–32)
Glucose, Bld: 102 mg/dL — ABNORMAL HIGH (ref 70–99)
Potassium: 4.1 meq/L (ref 3.5–5.3)
Sodium: 148 meq/L — ABNORMAL HIGH (ref 135–145)

## 2009-11-03 ENCOUNTER — Emergency Department (HOSPITAL_COMMUNITY): Admission: EM | Admit: 2009-11-03 | Discharge: 2009-11-03 | Payer: Self-pay | Admitting: Emergency Medicine

## 2009-11-21 ENCOUNTER — Telehealth (INDEPENDENT_AMBULATORY_CARE_PROVIDER_SITE_OTHER): Payer: Self-pay | Admitting: Dermatology

## 2009-12-06 ENCOUNTER — Telehealth: Payer: Self-pay | Admitting: Internal Medicine

## 2009-12-16 ENCOUNTER — Ambulatory Visit: Payer: Self-pay | Admitting: Internal Medicine

## 2009-12-18 LAB — CONVERTED CEMR LAB
BUN: 23 mg/dL (ref 6–23)
CO2: 38 meq/L — ABNORMAL HIGH (ref 19–32)
Chloride: 100 meq/L (ref 96–112)
Glucose, Bld: 89 mg/dL (ref 70–99)
Potassium: 4.3 meq/L (ref 3.5–5.3)

## 2009-12-30 ENCOUNTER — Encounter: Payer: Self-pay | Admitting: Internal Medicine

## 2010-01-06 ENCOUNTER — Ambulatory Visit (HOSPITAL_COMMUNITY): Admission: RE | Admit: 2010-01-06 | Discharge: 2010-01-06 | Payer: Self-pay | Admitting: Internal Medicine

## 2010-01-06 ENCOUNTER — Ambulatory Visit: Payer: Self-pay | Admitting: Internal Medicine

## 2010-01-06 DIAGNOSIS — M542 Cervicalgia: Secondary | ICD-10-CM | POA: Insufficient documentation

## 2010-01-06 LAB — CONVERTED CEMR LAB
BUN: 23 mg/dL (ref 6–23)
CO2: 28 meq/L (ref 19–32)
Calcium: 9 mg/dL (ref 8.4–10.5)
Creatinine, Ser: 1.26 mg/dL (ref 0.40–1.50)
Glucose, Bld: 178 mg/dL — ABNORMAL HIGH (ref 70–99)

## 2010-01-13 ENCOUNTER — Ambulatory Visit: Payer: Self-pay | Admitting: Internal Medicine

## 2010-01-13 DIAGNOSIS — L03119 Cellulitis of unspecified part of limb: Secondary | ICD-10-CM

## 2010-01-13 DIAGNOSIS — L02419 Cutaneous abscess of limb, unspecified: Secondary | ICD-10-CM

## 2010-01-15 LAB — CONVERTED CEMR LAB
BUN: 25 mg/dL — ABNORMAL HIGH (ref 6–23)
Calcium: 9.2 mg/dL (ref 8.4–10.5)
Chloride: 97 meq/L (ref 96–112)
Creatinine, Ser: 1.53 mg/dL — ABNORMAL HIGH (ref 0.40–1.50)

## 2010-01-20 ENCOUNTER — Ambulatory Visit: Payer: Self-pay | Admitting: Internal Medicine

## 2010-02-16 ENCOUNTER — Ambulatory Visit: Payer: Self-pay | Admitting: Internal Medicine

## 2010-02-17 LAB — CONVERTED CEMR LAB
BUN: 35 mg/dL — ABNORMAL HIGH (ref 6–23)
Potassium: 4.3 meq/L (ref 3.5–5.3)
Sodium: 143 meq/L (ref 135–145)

## 2010-02-22 IMAGING — CR DG CHEST 1V PORT
1 series · 1 of 1 positions shown · non-contrast
Comparison: 03/20/2008

CLINICAL DATA: Central line placement

PORTABLE CHEST - 1 VIEW

[view not recorded]
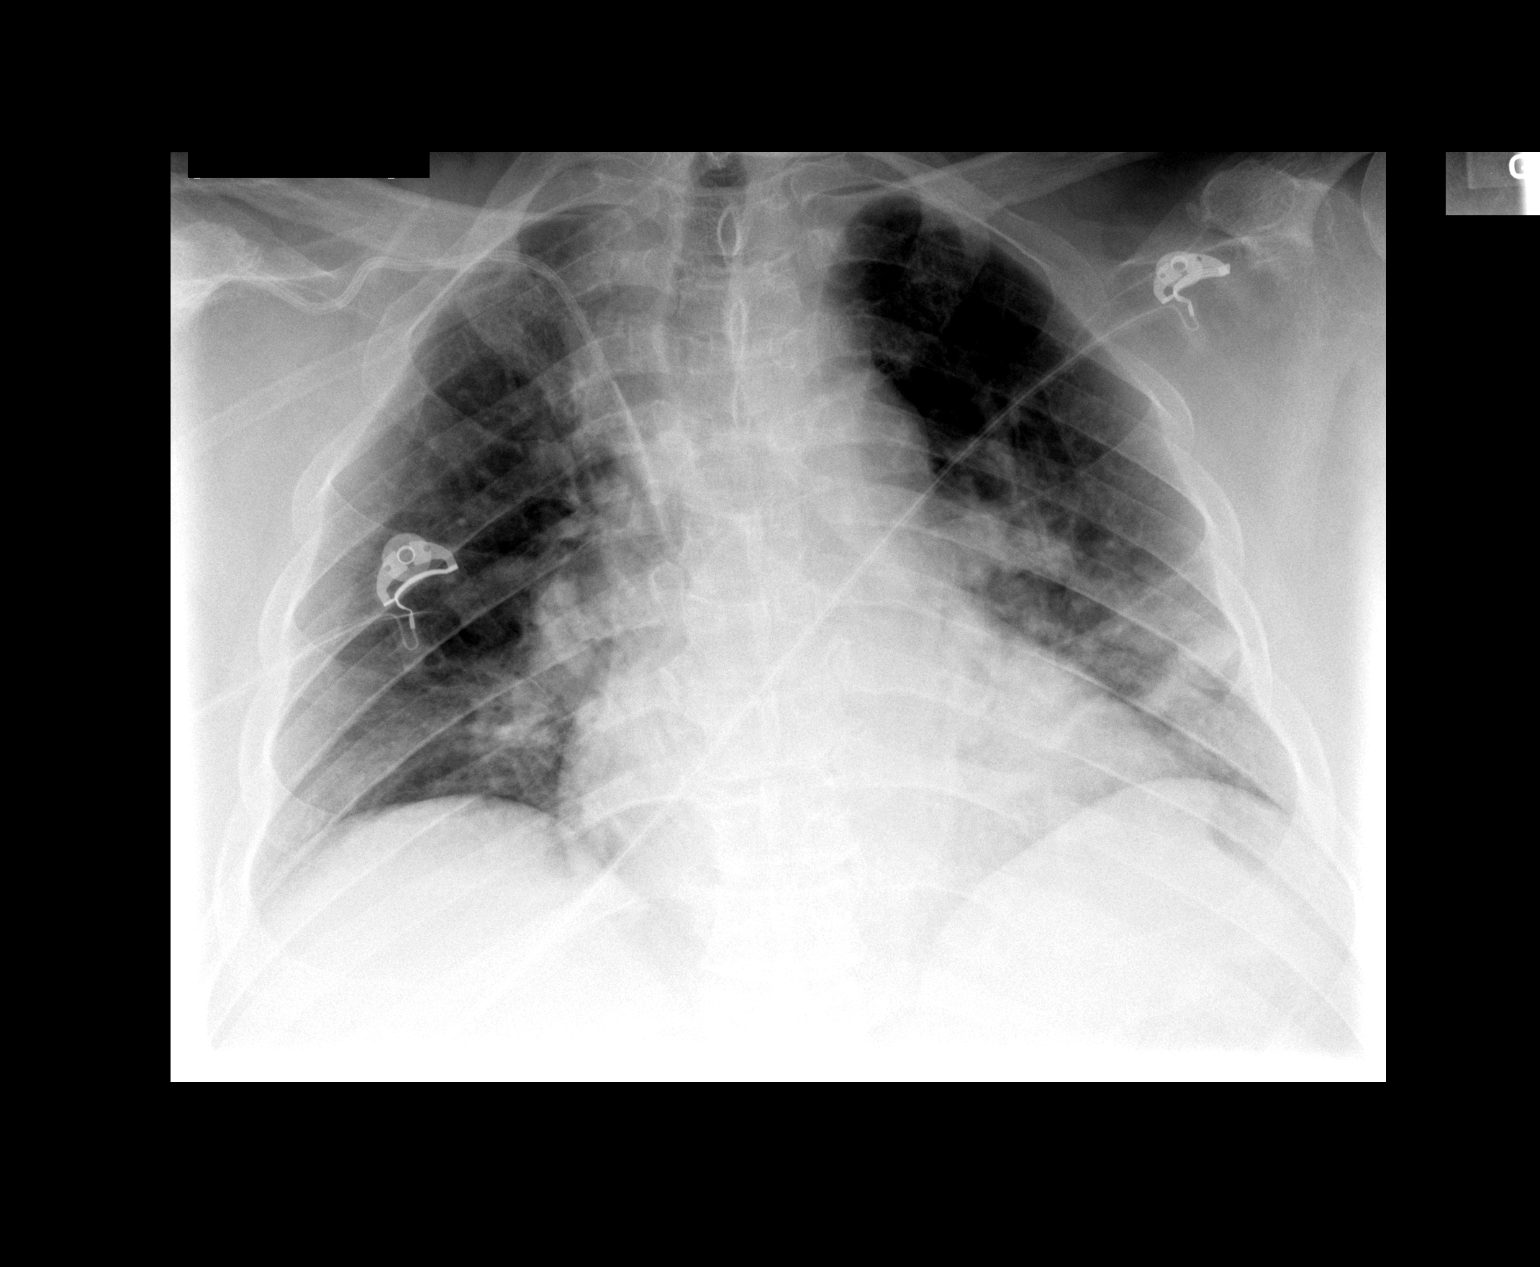

[1 of 1 positions shown; findings below may reference images not displayed]

FINDINGS: Central venous catheter been inserted via right
subclavian vein approach.  The catheter tip is in the mid SVC.  No
pneumothorax.  Chronic bilateral peribronchial thickening.
Moderate pulmonary vascular congestion.  Subsegmental atelectasis
left lower lung zone.
IMPRESSION: Specifically the CVC is in the mid SVC region.  No pneumothorax.

## 2010-02-27 ENCOUNTER — Telehealth: Payer: Self-pay | Admitting: Internal Medicine

## 2010-06-14 ENCOUNTER — Ambulatory Visit: Payer: Self-pay | Admitting: Internal Medicine

## 2010-06-14 DIAGNOSIS — IMO0002 Reserved for concepts with insufficient information to code with codable children: Secondary | ICD-10-CM

## 2010-06-14 HISTORY — DX: Reserved for concepts with insufficient information to code with codable children: IMO0002

## 2010-06-14 LAB — CONVERTED CEMR LAB
CO2: 33 meq/L — ABNORMAL HIGH (ref 19–32)
Calcium: 9.6 mg/dL (ref 8.4–10.5)
Eosinophils Relative: 3 % (ref 0–5)
Glucose, Bld: 284 mg/dL — ABNORMAL HIGH (ref 70–99)
HCT: 46.4 % (ref 39.0–52.0)
Hgb A1c MFr Bld: 8.1 %
LDL Cholesterol: 67 mg/dL (ref 0–99)
Lymphocytes Relative: 37 % (ref 12–46)
Lymphs Abs: 2.1 10*3/uL (ref 0.7–4.0)
Platelets: 175 10*3/uL (ref 150–400)
Potassium: 3.8 meq/L (ref 3.5–5.3)
Sodium: 144 meq/L (ref 135–145)
Total CHOL/HDL Ratio: 3.3
VLDL: 19 mg/dL (ref 0–40)
WBC: 5.8 10*3/uL (ref 4.0–10.5)

## 2010-07-12 ENCOUNTER — Ambulatory Visit: Payer: Self-pay | Admitting: Infectious Diseases

## 2010-07-12 ENCOUNTER — Inpatient Hospital Stay (HOSPITAL_COMMUNITY): Admission: EM | Admit: 2010-07-12 | Discharge: 2010-07-15 | Payer: Self-pay | Admitting: Emergency Medicine

## 2010-07-15 ENCOUNTER — Encounter: Payer: Self-pay | Admitting: Internal Medicine

## 2010-07-15 HISTORY — PX: CARDIAC CATHETERIZATION: SHX172

## 2010-07-22 ENCOUNTER — Encounter: Payer: Self-pay | Admitting: Internal Medicine

## 2010-08-04 ENCOUNTER — Ambulatory Visit: Payer: Self-pay | Admitting: Internal Medicine

## 2010-09-06 ENCOUNTER — Encounter: Payer: Self-pay | Admitting: Infectious Diseases

## 2010-10-11 ENCOUNTER — Telehealth: Payer: Self-pay | Admitting: Internal Medicine

## 2010-10-26 ENCOUNTER — Encounter: Payer: Self-pay | Admitting: Internal Medicine

## 2010-11-05 ENCOUNTER — Encounter: Payer: Self-pay | Admitting: Infectious Diseases

## 2010-11-14 NOTE — Progress Notes (Signed)
Summary: needs prescription for diabetes supplies/dmr  Phone Note Outgoing Call   Call placed by: Jamison Neighbor RD,CDE,  December 06, 2009 9:07 AM Summary of Call: follow-up to late December diabetes visit for elevated blood sugars/a1c. insulin regimen without change. spoke with wife who feels that his blood sugars are better  low 100s before dinner and most meals and that he doe snot need a change in insulin regimen. Healso is not intereted in chaing to the [employer]. Note that he now has Medicare and needs prescription for them to coer testing supplies- have asked mrs Nodal to have [place] test before nad 2 hours after meals for 2-3 days prior to next visit. she asks prescriptions to be sent to [facility] on [address]     New/Updated Medications: INSULIN SYRINGE 31G X 5/16" 1 ML  MISC (INSULIN SYRINGE-NEEDLE U-100) use to inject insulin three times  daily LANCETS   MISC (LANCETS) use to test blood sugar three to four times daily RELION ULTIMA TEST   STRP (GLUCOSE BLOOD) use to test blood sugar three to four times daily Prescriptions: RELION ULTIMA TEST   STRP (GLUCOSE BLOOD) use to test blood sugar three to four times daily  #150 x 10   Entered by:   Jamison Neighbor RD,CDE   Authorized by:   Clerance Lav MD   Signed by:   Clerance Lav MD on 12/06/2009   Method used:   Electronically to        [facility] [address] [phone]* (retail)       [address]       [place], [place]  [address]       Ph: [phone]       Fax: [phone]   RxID:   [id number] LANCETS   MISC (LANCETS) use to test blood sugar three to four times daily  #150 x 10   Entered by:   Jamison Neighbor RD,CDE   Authorized by:   Clerance Lav MD   Signed by:   Clerance Lav MD on 12/06/2009   Method used:   Electronically to        [facility] [address] [phone]* (retail)       [address]       [place], [place]  [address]       Ph: [phone]       Fax: [phone]   RxID:   [id number] INSULIN SYRINGE 31G X  5/16" 1 ML  MISC (INSULIN SYRINGE-NEEDLE U-100) use to inject insulin three times  daily  #100 x 11   Entered by:   Jamison Neighbor RD,CDE   Authorized by:   Clerance Lav MD   Signed by:   Clerance Lav MD on 12/06/2009   Method used:   Electronically to        [facility] [phone]* (retail)       [address]       [place], [place]  [address]       Ph: [phone]       Fax: [phone]   RxID:   [id number]
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
## 2010-11-14 NOTE — Progress Notes (Signed)
Summary: p[hone/gg  Phone Note Call from Patient   Caller: Spouse Summary of Call: Received call from pt's wife stating she needs a referral to a Physical Therapist - Alvira Monday at  [facility] [facility] [address] [phone] ) to wrap her husbands legs.  Please advise     Fax # for Zella Ball is [phone]  Pt # [phone] Initial call taken by: Merrie Roof RN,  November 21, 2009 11:29 AM  Follow-up for Phone Call        I have placed order. Please fax whatever is needed to make referral.      Appended Document: p[hone/gg referral faxed to Physical Therapist

## 2010-11-14 NOTE — Assessment & Plan Note (Signed)
Summary: 1WK F/U/SHAH/VS   Vital Signs:  Patient profile:   70 year old male Height:      65 inches (165.10 cm) Weight:      262.2 pounds (120.05 kg) BMI:     44.11 Temp:     97.9 degrees F (36.61 degrees C) oral Pulse rate:   90 / minute BP sitting:   165 / 61  (right arm) Cuff size:   large  Vitals Entered By: Theotis Barrio NT II (October 28, 2009 4:15 PM) CC: FOLLOW UP ON SWELLING OF LEGS   /  WALKS WITH THE ASSIST OF A CANE Is Patient Diabetic? Yes Did you bring your meter with you today? Yes Pain Assessment Patient in pain? no      Nutritional Status BMI of > 30 = obese CBG Result 124  Have you ever been in a relationship where you felt threatened, hurt or afraid?No   Does patient need assistance? Functional Status Self care Ambulation Normal Comments FOLLOW UP  ON SWELLING OF LEGS  /  WALKS WITH THE ASSIST OF A CANE   Primary Care Provider:  Clerance Lav MD  CC:  FOLLOW UP ON SWELLING OF LEGS   /  WALKS WITH THE ASSIST OF A CANE.  History of Present Illness: 70 yo man who presents for 1 week fu of LE edema:  Patient was seen 09/27/09 by Dr. Sherryll Burger with some SOB and worsening LE edema. At time he had a normal BNP and had his dose of Lasix increased from 80/day to 120. Then seen 10-20-2009 by me and I increased Lasix to 160/day and encouraged leg elevation and even ace bandage wraps if possible.  Of note, he has a history of chest pain in the past several months that has been evaluated fully.  He  had a heart cath 07/2009 [results showed Minimal nonobstructive coronary artery disease with 40% ostial narrowing in the circumflex artery, minimal irregularities in the LAD, and a normal right coronary artery with normal LV function]. He says that after this cath he started to have LE swelling after that.  He also had an echo in May 2010 that showed: The cavity size was normal. Wall thickness was      increased in a pattern of mild LVH. The estimated ejection  fraction was 65%. Wall motion was normal; there were no regional      wall motion abnormalities. Of note, he has had several normal echos in the past, some showing mild diastolic dysfunction.   He is not having any chest pain.  He has compression stockings at home but has not been using them. He says that he has the biggest size but that he cannot fit them over his leg with the increase in swelling.   has been elevating his legs for 3-4 hours during the day. He lies flat at night. He has seen no improvement in his swelling.   No pain in his legs. He is ambulating "OK" but does have some chronic knee pain.   Has some increased SOB with walking recently. Can walk from the exam room to the waiting room without getting SOB but says that a few months ago he could do much better than this.   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) 10)  Dizziness  (ICD-780.4) 11)  Claudication  (ICD-443.9) 12)  Tinea Pedis  (ICD-110.4) 13)  Loss, Hearing Nos  (ICD-389.9) 14)  Spondylosis, Lumbosacral  (ICD-721.3) 15)  Fatigue  (ICD-780.79) 16)  Abnormal Electrocardiogram  (ICD-794.31) 17)  Dependent Edema, Legs, Bilateral  (ICD-782.3) 18)  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 Take 1 Tablet By Mouth Three Times A Day 2)  Aspirin 81 Mg Chew (Aspirin) .... Take On Tablet By Mouth Once Daily 3)  Lantus 100 Unit/ml Soln (Insulin Glargine) .... Administer 56 Units in The Morning With Breakfast 4)  Eplerenone 50 Mg Tabs (Eplerenone) .... Take One Tab By Mouth Twice Daily 5)  Coreg 12.5 Mg Tabs (Carvedilol) .... Take 1 Pill By Mouth Two Times A Day. 6)  Insulin Syringe 31g X 5/16" 1 Ml  Misc (Insulin Syringe-Needle U-100) .... Use To Inject Insulin Twice Daily 7)  Lancets   Misc (Lancets) .... Use To Test Blood Glucose Twice Daily 8)  Relion Ultima Test   Strp (Glucose Blood) .... Use To Test Blood Glucose Twice Daily 9)  Simvastatin 40 Mg Tabs (Simvastatin) .... Take 1 Tablet By Mouth Once A Day 10)  Novolog 100 Unit/ml Soln (Insulin Aspart) .... Inject Using Moderate Supplemental Scale 15 Minutes Before Meals.(101-150=2 Units,151-200=3units,201-250=6units,251-300=9units,301-350=12units 11)  Terazosin Hcl 5 Mg Caps (Terazosin Hcl) .... Take 1 Tab By Mouth At Bedtime 12)  Oyster Shell Calcium/d 500-400 Mg-Unit Tabs (Calcium-Vitamin D) .... Take 1 Tablet By Mouth Once A Day 13)  Senokot S 8.6-50 Mg Tabs (Sennosides-Docusate Sodium) .... Take One Tab By Mouth Once Daily, and If No Bm in 48 Hours, Can Increase To Two Times A Day, Until Bm 14)  Lisinopril 20 Mg Tabs (Lisinopril) .... Take 1 Tablet By Mouth Once A Day 15)  Furosemide 80 Mg Tabs (Furosemide) .... Take 2 Pills By Mouth Daily. 16)  Norvasc 10 Mg Tabs (Amlodipine Besylate) .... Take 1 Pill By Mouth Daily.  Allergies (verified): 1)  ! * Avandia 2)  ! * Actos  Review of Systems        The patient complains of weight gain, dyspnea on exertion, and peripheral edema.  The patient denies anorexia, fever, weight loss, vision loss, decreased hearing, hoarseness, chest pain, syncope, prolonged cough, headaches, hemoptysis, abdominal pain, melena, hematochezia, severe indigestion/heartburn, hematuria, incontinence, genital sores, muscle weakness, transient blindness, difficulty walking, depression, abnormal bleeding, and enlarged lymph nodes.    Physical Exam  General:  alert, well-developed, well-nourished, and well-hydrated.  Obese. Head:  normocephalic, atraumatic, and no abnormalities observed.   Eyes:  vision grossly  intact, pupils equal, pupils round, and pupils reactive to light.   Ears:  R ear normal and L ear normal.   Nose:  no external deformity.   Lungs:  normal respiratory effort, no accessory muscle use,. No wheezes. Mild bibasilar crackles that were not present on exam 1 week ago. Heart:  normal rate, regular rhythm, no murmur, no gallop, and no rub.   Abdomen:  soft, non-tender, and normal bowel sounds.  Obese. No hepatosplenomegaly appreciated.  Extremities:  Patient has +++++Le edema on both sides. He also has some small excoriations in the skin where he has scratched  the skin. These are not draining or inflammed looking.  Neurologic:  alert & oriented X3.   Skin:   He  has some small excoriations in the skin where he has scratched the skin. These are not draining or inflammed looking.  Psych:  Oriented X3, memory intact for recent and remote, normally interactive, good eye contact, not anxious appearing, and not depressed appearing.     Impression & Recommendations:  Problem # 1:  DEPENDENT EDEMA, LEGS, BILATERAL (ICD-782.3) I have discussed this case with Dr. Josem Kaufmann. It appears that simply increasing the dose of Lasix is not working. He is elevating his legs, but he still has the edema whenever he gets up and walks around. I have decreased his Lasix back to his  normal dose of 80/day. Given his findings of edema, uncontrolled HTN, crackles in his lungs, and SOB, one could be concerned for a cardiac etiology, but his heart has been evaluated fully recently. He does have some diastolic dysfunction which might cause his findings, and I have increased his coreg from 12.5-->25. I also think that non-compliance with CPAP could explain these findings, and he does tell me that he sleeps at least 3 hours a day without wearing the CPAP. He does wear it when he makes it to bed, but he often sleeps several hours before this time without wearing it. I have stressed CPAP compliance as hypoxia and pulmonary vasoconstriction could lead to a constellation of findings such as his.  I also think that obesity and venous insufficiency are of central important in this case. He needs to lose weight, and he also needs to get his edema reduced to the point where he can wear his compression stockings on a daily basis. This may take weekly leg wrappings, but I am not sure where this can be performed in [place]. For now I have encouraged him to keep his legs elevated as much as possible since he spends much of his day at home and really can keep them elevated. If he can get the swelling reduced enough to wear his compression stockings he needs to wear them daily.   His updated medication list for this problem includes:    Furosemide 80 Mg Tabs (Furosemide) .Marland Kitchen... Take 1 tablet by mouth once a day  Orders: T-Basic Metabolic Panel [id number])  Problem # 2:  SLEEP APNEA, OBSTRUCTIVE, MILD (ICD-327.23) See discussion above. I have stressed CPAP compliance.   Problem # 3:  HYPERTENSION (ICD-401.9) This patient's BP is still too high. This might be explained by CPAP non-compliance. He also have some mild diastolic dysfunction on past echos, and I have increased his Coreg from 12.5-->25. Will check again at followup.  The following medications were removed from the medication list:     Coreg 12.5 Mg Tabs (Carvedilol) .Marland Kitchen... Take 1 pill by mouth two times a day. His updated medication list for this  problem includes:    Eplerenone 50 Mg Tabs (Eplerenone) .Marland Kitchen... Take one tab by mouth twice daily    Terazosin Hcl 5 Mg Caps (Terazosin hcl) .Marland Kitchen... Take 1 tab by mouth at bedtime    Lisinopril 20 Mg Tabs (Lisinopril) .Marland Kitchen... Take 1 tablet by mouth once a day    Furosemide 80 Mg Tabs (Furosemide) .Marland Kitchen... Take 1 tablet by mouth once a day    Norvasc 10 Mg Tabs (Amlodipine besylate) .Marland Kitchen... Take 1 pill by mouth daily.    Carvedilol 25 Mg Tabs (Carvedilol) .Marland Kitchen... Please take one tab by mouth twice daily.  Orders: T-Basic Metabolic Panel [id number])  Problem # 4:  CONSTIPATION (ICD-564.00) This patient says that he had a BM yesterday but that at times he can go 2-3 days without having one. I have encouraged using OTC miralax.  His updated medication list for this problem includes:    Senokot S 8.6-50 Mg Tabs (Sennosides-docusate sodium) .Marland Kitchen... Take one tab by mouth once daily, and if no bm in 48 hours, can increase to two times a day, until bm    Miralax Powd (Polyethylene glycol 3350) .Marland Kitchen... Please use as package directs as needed for constipation.  Complete Medication List: 1)  Neurontin 600 Mg Tabs (Gabapentin) .... Take 1 tablet by mouth three times a day 2)  Aspirin 81 Mg Chew (Aspirin) .... Take on tablet by mouth once daily 3)  Lantus 100 Unit/ml Soln (Insulin glargine) .... Administer 56 units in the morning with breakfast 4)  Eplerenone 50 Mg Tabs (Eplerenone) .... Take one tab by mouth twice daily 5)  Insulin Syringe 31g X 5/16" 1 Ml Misc (Insulin syringe-needle u-100) .... Use to inject insulin twice daily 6)  Lancets Misc (Lancets) .... Use to test blood glucose twice daily 7)  Relion Ultima Test Strp (Glucose blood) .... Use to test blood glucose twice daily 8)  Simvastatin 40 Mg Tabs (Simvastatin) .... Take 1 tablet by mouth once a day 9)  Novolog 100 Unit/ml Soln (Insulin  aspart) .... Inject using moderate supplemental scale 15 minutes before meals.(101-150=2 units,151-200=3units,201-250=6units,251-300=9units,301-350=12units 10)  Terazosin Hcl 5 Mg Caps (Terazosin hcl) .... Take 1 tab by mouth at bedtime 11)  Oyster Shell Calcium/d 500-400 Mg-unit Tabs (Calcium-vitamin d) .... Take 1 tablet by mouth once a day 12)  Senokot S 8.6-50 Mg Tabs (Sennosides-docusate sodium) .... Take one tab by mouth once daily, and if no bm in 48 hours, can increase to two times a day, until bm 13)  Lisinopril 20 Mg Tabs (Lisinopril) .... Take 1 tablet by mouth once a day 14)  Furosemide 80 Mg Tabs (Furosemide) .... Take 1 tablet by mouth once a day 15)  Norvasc 10 Mg Tabs (Amlodipine besylate) .... Take 1 pill by mouth daily. 16)  Miralax Powd (Polyethylene glycol 3350) .... Please use as package directs as needed for constipation. 17)  Carvedilol 25 Mg Tabs (Carvedilol) .... Please take one tab by mouth twice daily.  Patient Instructions: 1)  Please return to clinic in 3-4 weeks to monitor your symptoms. 2)  Please wear your CPAP any and every time you sleep. 3)  Please note that i have reduced your Lasix back to 80MG  once daily. 4)  I have also increased your carvedilol to 25MG  twice daily.  Process Orders Check Orders Results:     [facility]: Check successful Tests Sent for requisitioning (October 29, 2009 7:48 AM):     10/28/2009: [facility] -- T-Basic Metabolic Panel [id number] (signed)  Prescriptions: CARVEDILOL 25 MG TABS (CARVEDILOL) Please take one tab by mouth twice daily.  #62 x 3   Entered and Authorized by:   Aris Lot MD   Signed by:   Aris Lot MD on 10/28/2009   Method used:   Print then Give to Patient   RxID:   [id number]  Process Orders Check Orders Results:     [facility]: Check successful Tests Sent for requisitioning (October 29, 2009 7:48 AM):     10/28/2009: [facility] -- T-Basic Metabolic Panel [phone] (signed)    Prevention & Chronic Care Immunizations   Influenza vaccine: Fluvax MCR  (08/09/2009)    Tetanus booster: Not documented   Td booster deferral: Refused  (06/27/2009)    Pneumococcal vaccine: Not documented   Pneumococcal vaccine due: 06/15/2013    H. zoster vaccine: Not documented  Colorectal Screening   Hemoccult: Not documented   Hemoccult action/deferral: Ordered  (06/27/2009)    Colonoscopy: Not documented   Colonoscopy action/deferral: Deferred  (06/27/2009)  Other Screening   PSA: 2.95  (06/27/2009)   PSA action/deferral: Discussed-PSA requested  (06/27/2009)   Smoking status: quit  (10/28/2009)  Diabetes Mellitus   HgbA1C: 8.2  (09/27/2009)   HgbA1C action/deferral: Ordered  (06/27/2009)    Eye exam: called Dr. Hans Eden office for clarification:  Mild non-proliferative diabetic retinopathy.     (09/17/2008)   Eye exam due: 09/2009    Foot exam: yes  (03/10/2009)   High risk foot: No  (01/09/2008)   Foot care education: Not documented   Foot exam due: 06/15/2008    Urine microalbumin/creatinine ratio: 8.3  (08/10/2009)  Lipids   Total Cholesterol: 128  (04/19/2009)   LDL: 67  (04/19/2009)   LDL Direct: Not documented   HDL: 34  (04/19/2009)   Triglycerides: 136  (04/19/2009)    SGOT (AST): 18  (08/10/2009)   SGPT (ALT): 27  (08/10/2009)   Alkaline phosphatase: 60  (08/10/2009)   Total bilirubin: 0.6  (08/10/2009)  Hypertension   Last Blood Pressure: 165 / 61  (10/28/2009)   Serum creatinine: 1.51  (10/20/2009)   Serum potassium 4.2  (10/20/2009)  Self-Management Support :   Personal Goals (by the next clinic visit) :     Personal A1C goal: 7  (06/27/2009)     Personal blood pressure goal: 140/90  (06/27/2009)     Personal LDL goal: 70  (08/09/2009)    Patient will work on the following items until the next clinic visit to reach self-care goals:     Medications and  monitoring: take my medicines every day, check my blood sugar, bring all of my medications to every visit, examine my feet every day  (10/28/2009)     Eating: drink diet soda or water instead of juice or soda, eat more vegetables, use fresh or frozen vegetables, eat foods that are low in salt, eat baked foods instead of fried foods, eat fruit for snacks and desserts, limit or avoid alcohol  (10/28/2009)     Activity: take a 30 minute walk every day  (09/27/2009)    Diabetes self-management support: Written self-care plan  (09/27/2009)   Last diabetes self-management training by diabetes educator: 10/06/2009   Last medical nutrition therapy: 03/12/2008    Hypertension self-management support: Written self-care plan  (09/27/2009)    Lipid self-management support: Written self-care plan  (09/27/2009)
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
## 2010-11-14 NOTE — Letter (Signed)
Summary: TWO WEEK GLUCOSE  TWO WEEK GLUCOSE   Imported By: Margie Billet 08/16/2010 15:20:24  _____________________________________________________________________  External Attachment:    Type:   Image     Comment:   External Document
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
## 2010-11-14 NOTE — Assessment & Plan Note (Signed)
Summary: 2WK F/U/SHAH/VS   Vital Signs:  Patient profile:   70 year old male Height:      65 inches (165.10 cm) Weight:      264.1 pounds (117.77 kg) BMI:     43.27 Temp:     97.6 degrees F (36.44 degrees C) oral Pulse rate:   73 / minute BP sitting:   152 / 70  (left arm) Cuff size:   large  Vitals Entered By: Theotis Barrio NT II (October 20, 2009 9:30 AM)  Nutrition Counseling: Patient's BMI is greater than 25 and therefore counseled on weight management options. CC: MEDICATION REFILL /CBG ;   / check left leg Is Patient Diabetic? Yes Did you bring your meter with you today? MENo Pain Assessment Patient in pain? yes     Location: LEFT LEG Intensity:  67 Type: aching Onset of pain  CHROINIC CBG Result 187  Have you ever been in a relationship where you felt threatened, hurt or afraid?No   Does patient need assistance? Functional Status Self care Ambulation Normal, Wheelchair Comments MEDICATION REFILL  / CHECK LEFT LEG.   Primary Care Provider:  Clerance Lav MD  CC:  MEDICATION REFILL /CBG ;   / check left leg.  History of Present Illness: 70 yo man with PMH as listed below who presents for 2 week fu of LE edema:  Patient was seen 09/27/09 by Dr. Sherryll Burger with some SOB and worsening LE edema. At time he had a normal BNP and had his dose of Lasix increased from 80/day to 120/day. He says that the edema in unchanged although he is less SOB.   Of note, he has a history of chest pain in the past several months that has been evaluated fully.  He  had a heart cath 07/2009 [results showed Minimal nonobstructive coronary artery disease with 40% ostial narrowing in the circumflex artery, minimal irregularities in the LAD, and a normal right coronary artery with normal LV function]. He says that after this cath he started to have LE swelling after that.  He also had an echo in May 2010 that showed: The cavity size was normal. Wall thickness was      increased in a pattern of  mild LVH. The estimated ejection      fraction was 65%. Wall motion was normal; there were no regional      wall motion abnormalities. Of note, he has had several normal echos in the past.   He is not having any chest pain. His complaint is really the LE edema.   He says that he is up moving around a lot and that he does not elevate his legs much but that when he wakes up in the morning after lying flat the swelling has gone down some but returns when he gets up.   He has compression stockings at home but has not been using them. He says that he has the biggest size but that he cannot fit them over his leg with the increase in swelling.   His wife tells me that he does not elevate his legs and that he eve sleep with his legs down.    Preventive Screening-Counseling & Management  Alcohol-Tobacco     Alcohol drinks/day: 0     Smoking Status: quit     Year Quit: 1980  Caffeine-Diet-Exercise     Does Patient Exercise: no     Type of exercise: IN HOUSE BIKE     Times/week: 2  Problems Prior to Update: 1)  Dyspnea On Exertion  (ICD-786.09) 2)  Organic Impotence  (ICD-607.84) 3)  Sexual Activity, High Risk  (ICD-V69.2) 4)  Chest Pain, Acute  (ICD-786.50) 5)  Skin Lesion  (ICD-709.9) 6)  Constipation  (ICD-564.00) 7)  Vitamin D Deficiency  (ICD-268.9) 8)  Memory Loss  (ICD-780.93) 9)  Cva  (ICD-434.91) 10)  Dizziness  (ICD-780.4) 11)  Claudication  (ICD-443.9) 12)  Tinea Pedis  (ICD-110.4) 13)  Loss, Hearing Nos  (ICD-389.9) 14)  Spondylosis, Lumbosacral  (ICD-721.3) 15)  Fatigue  (ICD-780.79) 16)  Abnormal Electrocardiogram  (ICD-794.31) 17)  Dependent Edema, Legs, Bilateral  (ICD-782.3) 18)  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 * Avandia 2)  ! * Actos  Review of Systems       see HPI regarding edema. His only other complaint is some small excoriations on his LEs where he has knicked his skin.   Physical Exam  General:  alert, well-developed, well-nourished, and well-hydrated.   Head:  normocephalic and atraumatic.   Ears:  R ear normal and L ear normal.   Nose:  no external deformity.   Neck:  supple.   Lungs:  normal respiratory effort, no accessory muscle use, normal breath sounds, no crackles, and no wheezes.   Heart:  normal rate, regular rhythm, no murmur, and no gallop.   Abdomen:  soft, non-tender, and normal bowel sounds.   Extremities:  Patient has +++++Le edema on both sides. He also has some small excoriations in the skin where he has scratched  the skin. These are not draining or inflammed looking.  Neurologic:  alert & oriented X3.   Skin:   He  has some small excoriations in the skin where he has scratched the skin. These are not draining or inflammed looking.  Psych:  Oriented X3, memory intact for recent and remote, normally interactive, good eye contact, not anxious appearing, and not depressed appearing.     Impression & Recommendations:  Problem # 1:  DEPENDENT EDEMA, LEGS, BILATERAL (ICD-782.3) Given that this patient has had his heart evaluated with cath as well as echo that were for the most part normal over the last few months and that he is having no active cardiac complaints, I think that his LE edema is due to venous isufficiency rather than his heart. His BNP was normal at last visit a few weeks ago. Of note, his renal function has remained stable over the last several months.  I have discussed that the goal needs to be to get the edema down enough to  where he can wear the compression stockings daily. For now his legs are too big to fit into them. I tell him that the most effective way to do this would be to send him to get his legs wrapped weekly for a few weeks until they are small enough to fit into the stockings. For now he will elect to have his Lasix increased, keep his legs elevated as much as possible, and put his compression stockings  on if at all possible. We also discussed that he can do some home wrappings from the feet to the knees with ACE bandages if he is willing. I also mentioned that weight loss would most likely greatly help his condition. I will check Bmet today and bring him back in 1 week to evaluate symptoms as well as Bmet again as I have increased Lasix dose to 80MG  two times a day.   **Bmet shows that his renal function is essentially at baseline. Will need to monitor closely. He has a fu with me 10-28-2009 at which time another Bmet will be checked.   His updated medication list for this problem includes:    Furosemide 80 Mg Tabs (Furosemide) .Marland Kitchen... Take 2 pills by mouth daily.  Orders: T-Basic Metabolic Panel [phone])  Problem # 2:  HYPERTENSION (ICD-401.9) BP elevated today. Am increased Lasix dose for LE edema so will make no other changes to BP regimen today.  His updated medication list for this problem includes:    Eplerenone 50 Mg Tabs (Eplerenone) .Marland Kitchen... Take one tab by mouth twice daily    Coreg 12.5 Mg Tabs (Carvedilol) .Marland Kitchen... Take 1 pill by mouth two times a day.    Terazosin Hcl 5 Mg Caps (Terazosin hcl) .Marland Kitchen... Take 1 tab by mouth at bedtime    Lisinopril 20 Mg Tabs (Lisinopril) .Marland Kitchen... Take 1 tablet by mouth once a day    Furosemide 80 Mg Tabs (Furosemide) .Marland Kitchen... Take 2 pills by mouth daily.    Norvasc 10 Mg Tabs (Amlodipine besylate) .Marland Kitchen... Take 1 pill by mouth daily.  Complete Medication List: 1)  Neurontin 600 Mg Tabs (Gabapentin) .... Take 1 tablet by mouth three times a day 2)  Aspirin 81 Mg Chew  (Aspirin) .... Take on tablet by mouth once daily 3)  Lantus 100 Unit/ml Soln (Insulin glargine) .... Administer 56 units in the morning with breakfast 4)  Eplerenone 50 Mg Tabs (Eplerenone) .... Take one tab by mouth twice daily 5)  Coreg 12.5 Mg Tabs (Carvedilol) .... Take 1 pill by mouth two times a day. 6)  Insulin Syringe 31g X 5/16" 1 Ml Misc (Insulin syringe-needle u-100) .... Use to inject insulin twice daily 7)  Lancets Misc (Lancets) .... Use to test blood glucose twice daily 8)  Relion Ultima Test Strp (Glucose blood) .... Use to test blood glucose twice daily 9)  Simvastatin 40 Mg Tabs (Simvastatin) .... Take 1 tablet by mouth once a day 10)  Novolog 100 Unit/ml Soln (Insulin aspart) .... Inject using moderate supplemental scale 15 minutes before meals.(101-150=2 units,151-200=3units,201-250=6units,251-300=9units,301-350=12units 11)  Terazosin Hcl 5 Mg Caps (Terazosin hcl) .... Take 1 tab by mouth at bedtime 12)  Oyster Shell Calcium/d 500-400 Mg-unit Tabs (Calcium-vitamin d) .... Take 1 tablet by mouth once a day 13)  Senokot S 8.6-50 Mg Tabs (Sennosides-docusate sodium) .... Take one tab by mouth once daily, and if no bm in 48 hours, can increase to two times a day, until bm 14)  Lisinopril 20 Mg Tabs (Lisinopril) .... Take 1 tablet by mouth once a day 15)  Furosemide 80 Mg Tabs (Furosemide) .... Take 2 pills by mouth daily. 16)  Norvasc 10 Mg Tabs (Amlodipine besylate) .... Take 1 pill by mouth daily.  Other Orders: Capillary Blood Glucose/CBG ([id number])  Patient Instructions: 1)  Please schedule a followup appointment in 1 week to recheck your swelling and your labs.  2)  I will call you with your lab results.  3)  Please note that  I have increased your Lasix dose to 2 pills daily. Please keep your legs elevated and you may even try to wrap them with ACE bandages for a few hours a day. Please try to sleep with your legs elevated up. If at all possible please put your compression  stockings on.  Prescriptions: FUROSEMIDE 80 MG TABS (FUROSEMIDE) take 2 pills by mouth daily.  #60 x 1   Entered and Authorized by:   Aris Lot MD   Signed by:   Aris Lot MD on 10/20/2009   Method used:   Faxed to ...       [facility] [address] [phone]* (retail)       [address]       [place], [place]  [address]       Ph: [phone]       Fax: [phone]   RxID:   [id number]   Process Orders Check Orders Results:     [facility]: Check successful Tests Sent for requisitioning (October 21, 2009 9:57 AM):     10/20/2009: [facility] -- T-Basic Metabolic Panel [phone] (signed)
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
## 2010-11-14 NOTE — Progress Notes (Signed)
Summary: REfill/gh  Phone Note Refill Request Message from:  Fax from Pharmacy on Feb 27, 2010 5:14 PM  Refills Requested: Medication #1:  FUROSEMIDE 80 MG TABS Take 1 tablet by once daily   Last Refilled: 01/06/2010  Medication #2:  NORVASC 10 MG TABS take 1 pill by mouth daily.   Last Refilled: 01/12/2010  Method Requested: Electronic Initial call taken by: Angelina Ok RN,  Feb 27, 2010 5:15 PM  Follow-up for Phone Call       Follow-up by: Clerance Lav MD,  Mar 02, 2010 9:31 AM    Prescriptions: NORVASC 10 MG TABS (AMLODIPINE BESYLATE) take 1 pill by mouth daily.  #30 x 6   Entered and Authorized by:   Clerance Lav MD   Signed by:   Clerance Lav MD on 03/02/2010   Method used:   Electronically to        [facility] [phone]* (retail)       [address]       [place], [place]  [address]       Ph: [phone]       Fax: [phone]   RxID:   [id number] FUROSEMIDE 80 MG TABS (FUROSEMIDE) Take 1 tablet by once daily  #30 x 6   Entered and Authorized by:   Clerance Lav MD   Signed by:   Clerance Lav MD on 03/02/2010   Method used:   Electronically to        [facility] [phone]* (retail)       [address]       [place], [place]  [address]       Ph: [phone]       Fax: [phone]   RxID:   [id number]

## 2010-11-14 NOTE — Letter (Signed)
Summary: [facility]  [facility]   Imported By: Florinda Marker 01/04/2010 14:46:06  _____________________________________________________________________  External Attachment:    Type:   Image     Comment:   External Document  Appended Document: [facility] pt did not go for rehab appt.

## 2010-11-14 NOTE — Miscellaneous (Signed)
Summary: [facility]: Orders  [facility]: Orders   Imported By: Florinda Marker 09/13/2010 16:33:59  _____________________________________________________________________  External Attachment:    Type:   Image     Comment:   External Document
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
## 2010-11-16 NOTE — Consult Note (Signed)
Summary: [facility]   [facility]   Imported By: Margie Billet 11/01/2010 11:20:42  _____________________________________________________________________  External Attachment:    Type:   Image     Comment:   External Document  Appended Document: [facility]   Prior Medications: NEURONTIN 600 MG  TABS (GABAPENTIN) Take 1 tablet by mouth three times a day LANTUS 100 UNIT/ML SOLN (INSULIN GLARGINE) Administer 60 units in the evening with dinner EPLERENONE 50 MG TABS (EPLERENONE) Take one tab by mouth twice daily INSULIN SYRINGE 31G X 5/16" 1 ML  MISC (INSULIN SYRINGE-NEEDLE U-100) use to inject insulin three times  daily LANCETS   MISC (LANCETS) use to test blood sugar three to four times daily RELION ULTIMA TEST   STRP (GLUCOSE BLOOD) use to test blood sugar three to four times daily SIMVASTATIN 40 MG TABS (SIMVASTATIN) Take 1 tablet by mouth once a day TERAZOSIN HCL 5 MG CAPS (TERAZOSIN HCL) Take 1 tab by mouth at bedtime OYSTER SHELL CALCIUM/D 500-400 MG-UNIT TABS (CALCIUM-VITAMIN D) Take 1 tablet by mouth once a day SENOKOT S 8.6-50 MG TABS (SENNOSIDES-DOCUSATE SODIUM) Take one tab by mouth once daily, and if no bm in 48 hours, can increase to two times a day, until bm LISINOPRIL 20 MG TABS (LISINOPRIL) Take 1 tablet by mouth once a day FUROSEMIDE 80 MG TABS (FUROSEMIDE) Take 1 tablet by once daily NORVASC 10 MG TABS (AMLODIPINE BESYLATE) take 1 pill by mouth daily. MIRALAX  POWD (POLYETHYLENE GLYCOL 3350) Please use as package directs as needed for constipation. COREG 12.5 MG TABS (CARVEDILOL) Take 1 tablet by mouth two times a day TRAMADOL HCL 50 MG TABS (TRAMADOL HCL) Take two tablets every six to eight hours as needed Current Allergies: ! * AVANDIA ! * ACTOS   Clinical Lists Changes  Medications: Removed medication of ASPIRIN 81 MG CHEW (ASPIRIN) Take on tablet by mouth once daily Added new medication of PLAVIX 75 MG TABS (CLOPIDOGREL  BISULFATE) Take 1 tablet by mouth once a day

## 2010-11-16 NOTE — Progress Notes (Signed)
Summary: Refill/gh  Phone Note Refill Request Message from:  Fax from Pharmacy on October 11, 2010 9:47 AM  Refills Requested: Medication #1:  LISINOPRIL 20 MG TABS Take 1 tablet by mouth once a day   Last Refilled: 09/06/2010  Method Requested: Electronic Initial call taken by: Angelina Ok RN,  October 11, 2010 9:47 AM  Follow-up for Phone Call       Follow-up by: Clerance Lav MD,  October 12, 2010 9:08 AM    Prescriptions: LISINOPRIL 20 MG TABS (LISINOPRIL) Take 1 tablet by mouth once a day  #31 x 2   Entered and Authorized by:   Clerance Lav MD   Signed by:   Clerance Lav MD on 10/12/2010   Method used:   Electronically to        [facility] [phone]* (retail)       [address]       [place], [place]  [address]       Ph: [phone]       Fax: [phone]   RxID:   [id number]

## 2010-12-08 ENCOUNTER — Other Ambulatory Visit: Payer: Self-pay | Admitting: *Deleted

## 2010-12-08 MED ORDER — TERAZOSIN HCL 5 MG PO CAPS: 5.0000 mg | ORAL_CAPSULE | Freq: Every day | ORAL | Status: DC

## 2010-12-28 LAB — CBC
HCT: 41.4 % (ref 39.0–52.0)
HCT: 44.8 % (ref 39.0–52.0)
HCT: 47.2 % (ref 39.0–52.0)
Hemoglobin: 13.1 g/dL (ref 13.0–17.0)
Hemoglobin: 13.8 g/dL (ref 13.0–17.0)
MCH: 25.8 pg — ABNORMAL LOW (ref 26.0–34.0)
MCH: 26.3 pg (ref 26.0–34.0)
MCHC: 31.6 g/dL (ref 30.0–36.0)
MCV: 83.1 fL (ref 78.0–100.0)
MCV: 83.8 fL (ref 78.0–100.0)
Platelets: 127 10*3/uL — ABNORMAL LOW (ref 150–400)
Platelets: 142 10*3/uL — ABNORMAL LOW (ref 150–400)
RBC: 4.98 MIL/uL (ref 4.22–5.81)
RBC: 5.11 MIL/uL (ref 4.22–5.81)
RBC: 5.69 MIL/uL (ref 4.22–5.81)
RDW: 15.3 % (ref 11.5–15.5)
RDW: 15.5 % (ref 11.5–15.5)
WBC: 5.3 10*3/uL (ref 4.0–10.5)
WBC: 5.3 10*3/uL (ref 4.0–10.5)
WBC: 6.1 10*3/uL (ref 4.0–10.5)

## 2010-12-28 LAB — CARDIAC PANEL(CRET KIN+CKTOT+MB+TROPI)
CK, MB: 2 ng/mL (ref 0.3–4.0)
CK, MB: 2.5 ng/mL (ref 0.3–4.0)
Relative Index: 1.1 (ref 0.0–2.5)
Total CK: 177 U/L (ref 7–232)
Troponin I: 0.01 ng/mL (ref 0.00–0.06)
Troponin I: 0.02 ng/mL (ref 0.00–0.06)

## 2010-12-28 LAB — COMPREHENSIVE METABOLIC PANEL
AST: 31 U/L (ref 0–37)
BUN: 23 mg/dL (ref 6–23)
CO2: 29 mEq/L (ref 19–32)
Calcium: 9 mg/dL (ref 8.4–10.5)
Chloride: 106 mEq/L (ref 96–112)
Creatinine, Ser: 1.19 mg/dL (ref 0.4–1.5)
GFR calc Af Amer: >60 mL/min (ref >60)
GFR calc non Af Amer: >60 mL/min (ref >60)
Glucose, Bld: 157 mg/dL — ABNORMAL HIGH (ref 70–99)
Total Bilirubin: 1.3 mg/dL — ABNORMAL HIGH (ref 0.3–1.2)

## 2010-12-28 LAB — DIFFERENTIAL
Basophils Absolute: 0.1 10*3/uL (ref 0.0–0.1)
Eosinophils Absolute: 0.3 10*3/uL (ref 0.0–0.7)
Eosinophils Relative: 5 % (ref 0–5)
Lymphocytes Relative: 34 % (ref 12–46)
Lymphs Abs: 1.8 10*3/uL (ref 0.7–4.0)
Neutrophils Relative %: 49 % (ref 43–77)

## 2010-12-28 LAB — POCT CARDIAC MARKERS
Myoglobin, poc: 94.9 ng/mL (ref 12–200)
Troponin i, poc: <0.05 ng/mL (ref 0.00–0.09)

## 2010-12-28 LAB — BASIC METABOLIC PANEL
BUN: 22 mg/dL (ref 6–23)
BUN: 33 mg/dL — ABNORMAL HIGH (ref 6–23)
Calcium: 9.3 mg/dL (ref 8.4–10.5)
Chloride: 105 mEq/L (ref 96–112)
Creatinine, Ser: 1.41 mg/dL (ref 0.4–1.5)
GFR calc Af Amer: >60 mL/min (ref >60)
GFR calc Af Amer: >60 mL/min (ref >60)
GFR calc non Af Amer: 51 mL/min — ABNORMAL LOW (ref >60)
GFR calc non Af Amer: 52 mL/min — ABNORMAL LOW (ref >60)
Glucose, Bld: 105 mg/dL — ABNORMAL HIGH (ref 70–99)
Potassium: 4.2 mEq/L (ref 3.5–5.1)
Potassium: 4.3 mEq/L (ref 3.5–5.1)
Sodium: 140 mEq/L (ref 135–145)

## 2010-12-28 LAB — GLUCOSE, CAPILLARY
Glucose-Capillary: 115 mg/dL — ABNORMAL HIGH (ref 70–99)
Glucose-Capillary: 153 mg/dL — ABNORMAL HIGH (ref 70–99)
Glucose-Capillary: 170 mg/dL — ABNORMAL HIGH (ref 70–99)
Glucose-Capillary: 56 mg/dL — ABNORMAL LOW (ref 70–99)
Glucose-Capillary: 68 mg/dL — ABNORMAL LOW (ref 70–99)
Glucose-Capillary: 72 mg/dL (ref 70–99)

## 2010-12-28 LAB — LIPID PANEL
Cholesterol: 117 mg/dL (ref 0–200)
Triglycerides: 87 mg/dL (ref <150)

## 2010-12-28 LAB — TSH: TSH: 1.548 u[IU]/mL (ref 0.350–4.500)

## 2010-12-31 LAB — GLUCOSE, CAPILLARY: Glucose-Capillary: 124 mg/dL — ABNORMAL HIGH (ref 70–99)

## 2011-01-07 ENCOUNTER — Emergency Department (HOSPITAL_COMMUNITY)
Admission: EM | Admit: 2011-01-07 | Discharge: 2011-01-07 | Disposition: A | Discharge status: Home / Self Care | Payer: Medicare Other | Attending: Emergency Medicine | Admitting: Emergency Medicine

## 2011-01-07 DIAGNOSIS — I4460 Unspecified fascicular block: Secondary | ICD-10-CM | POA: Insufficient documentation

## 2011-01-07 DIAGNOSIS — Z8673 Personal history of transient ischemic attack (TIA), and cerebral infarction without residual deficits: Secondary | ICD-10-CM | POA: Insufficient documentation

## 2011-01-07 DIAGNOSIS — E1169 Type 2 diabetes mellitus with other specified complication: Secondary | ICD-10-CM | POA: Insufficient documentation

## 2011-01-07 DIAGNOSIS — Z794 Long term (current) use of insulin: Secondary | ICD-10-CM | POA: Insufficient documentation

## 2011-01-07 DIAGNOSIS — I509 Heart failure, unspecified: Secondary | ICD-10-CM | POA: Insufficient documentation

## 2011-01-07 DIAGNOSIS — I10 Essential (primary) hypertension: Secondary | ICD-10-CM | POA: Insufficient documentation

## 2011-01-07 DIAGNOSIS — R42 Dizziness and giddiness: Secondary | ICD-10-CM | POA: Insufficient documentation

## 2011-01-07 LAB — GLUCOSE, CAPILLARY
Glucose-Capillary: 108 mg/dL — ABNORMAL HIGH (ref 70–99)
Glucose-Capillary: 87 mg/dL (ref 70–99)
Glucose-Capillary: 130 mg/dL — ABNORMAL HIGH (ref 70–99)

## 2011-01-18 ENCOUNTER — Encounter: Payer: Self-pay | Admitting: Internal Medicine

## 2011-01-18 LAB — GLUCOSE, CAPILLARY
Glucose-Capillary: 104 mg/dL — ABNORMAL HIGH (ref 70–99)
Glucose-Capillary: 107 mg/dL — ABNORMAL HIGH (ref 70–99)
Glucose-Capillary: 113 mg/dL — ABNORMAL HIGH (ref 70–99)
Glucose-Capillary: 135 mg/dL — ABNORMAL HIGH (ref 70–99)
Glucose-Capillary: 138 mg/dL — ABNORMAL HIGH (ref 70–99)
Glucose-Capillary: 176 mg/dL — ABNORMAL HIGH (ref 70–99)
Glucose-Capillary: 199 mg/dL — ABNORMAL HIGH (ref 70–99)
Glucose-Capillary: 83 mg/dL (ref 70–99)

## 2011-01-18 LAB — COMPREHENSIVE METABOLIC PANEL
ALT: 27 U/L (ref 0–53)
Alkaline Phosphatase: 54 U/L (ref 39–117)
BUN: 23 mg/dL (ref 6–23)
CO2: 29 mEq/L (ref 19–32)
Chloride: 109 mEq/L (ref 96–112)
Glucose, Bld: 309 mg/dL — ABNORMAL HIGH (ref 70–99)
Potassium: 4.4 mEq/L (ref 3.5–5.1)
Sodium: 144 mEq/L (ref 135–145)
Total Bilirubin: 0.8 mg/dL (ref 0.3–1.2)

## 2011-01-18 LAB — CBC
HCT: 42.8 % (ref 39.0–52.0)
HCT: 42.8 % (ref 39.0–52.0)
HCT: 43.2 % (ref 39.0–52.0)
Hemoglobin: 13 g/dL (ref 13.0–17.0)
Hemoglobin: 13.8 g/dL (ref 13.0–17.0)
Hemoglobin: 14 g/dL (ref 13.0–17.0)
MCHC: 32 g/dL (ref 30.0–36.0)
MCHC: 32.4 g/dL (ref 30.0–36.0)
MCHC: 32.5 g/dL (ref 30.0–36.0)
MCV: 81.9 fL (ref 78.0–100.0)
MCV: 82 fL (ref 78.0–100.0)
MCV: 83 fL (ref 78.0–100.0)
Platelets: 172 10*3/uL (ref 150–400)
RBC: 4.84 MIL/uL (ref 4.22–5.81)
RBC: 5.1 MIL/uL (ref 4.22–5.81)
RBC: 5.23 MIL/uL (ref 4.22–5.81)
RBC: 5.23 MIL/uL (ref 4.22–5.81)
RBC: 5.26 MIL/uL (ref 4.22–5.81)
WBC: 6.3 10*3/uL (ref 4.0–10.5)
WBC: 6.4 10*3/uL (ref 4.0–10.5)

## 2011-01-18 LAB — BASIC METABOLIC PANEL
BUN: 21 mg/dL (ref 6–23)
BUN: 24 mg/dL — ABNORMAL HIGH (ref 6–23)
CO2: 32 mEq/L (ref 19–32)
CO2: 33 mEq/L — ABNORMAL HIGH (ref 19–32)
Calcium: 8.8 mg/dL (ref 8.4–10.5)
Calcium: 8.8 mg/dL (ref 8.4–10.5)
Calcium: 9.3 mg/dL (ref 8.4–10.5)
Chloride: 103 mEq/L (ref 96–112)
Chloride: 105 mEq/L (ref 96–112)
Creatinine, Ser: 1.36 mg/dL (ref 0.4–1.5)
Creatinine, Ser: 1.37 mg/dL (ref 0.4–1.5)
Creatinine, Ser: 1.43 mg/dL (ref 0.4–1.5)
GFR calc Af Amer: 60 mL/min — ABNORMAL LOW (ref >60)
GFR calc Af Amer: >60 mL/min (ref >60)
GFR calc Af Amer: >60 mL/min (ref >60)
GFR calc non Af Amer: 52 mL/min — ABNORMAL LOW (ref >60)
Potassium: 4.1 mEq/L (ref 3.5–5.1)
Sodium: 138 mEq/L (ref 135–145)
Sodium: 139 mEq/L (ref 135–145)

## 2011-01-18 LAB — POCT CARDIAC MARKERS
CKMB, poc: 2.4 ng/mL (ref 1.0–8.0)
Myoglobin, poc: 231 ng/mL (ref 12–200)
Troponin i, poc: <0.05 ng/mL (ref 0.00–0.09)
Troponin i, poc: <0.05 ng/mL (ref 0.00–0.09)

## 2011-01-18 LAB — DIFFERENTIAL
Basophils Relative: 0 % (ref 0–1)
Eosinophils Absolute: 0.2 10*3/uL (ref 0.0–0.7)
Eosinophils Relative: 3 % (ref 0–5)
Monocytes Relative: 8 % (ref 3–12)
Neutrophils Relative %: 64 % (ref 43–77)

## 2011-01-18 LAB — PROTIME-INR: INR: 1.06 (ref 0.00–1.49)

## 2011-01-18 LAB — CARDIAC PANEL(CRET KIN+CKTOT+MB+TROPI)
CK, MB: 3.4 ng/mL (ref 0.3–4.0)
Relative Index: 0.5 (ref 0.0–2.5)
Troponin I: 0.02 ng/mL (ref 0.00–0.06)

## 2011-01-18 LAB — LIPID PANEL
Cholesterol: 113 mg/dL (ref 0–200)
LDL Cholesterol: 43 mg/dL (ref 0–99)

## 2011-01-18 LAB — CK TOTAL AND CKMB (NOT AT ARMC): CK, MB: 3.5 ng/mL (ref 0.3–4.0)

## 2011-01-19 LAB — GLUCOSE, CAPILLARY: Glucose-Capillary: 277 mg/dL — ABNORMAL HIGH (ref 70–99)

## 2011-01-21 LAB — GLUCOSE, CAPILLARY: Glucose-Capillary: 296 mg/dL — ABNORMAL HIGH (ref 70–99)

## 2011-01-22 LAB — GLUCOSE, CAPILLARY
Glucose-Capillary: 252 mg/dL — ABNORMAL HIGH (ref 70–99)
Glucose-Capillary: 75 mg/dL (ref 70–99)

## 2011-01-23 LAB — GLUCOSE, CAPILLARY: Glucose-Capillary: 142 mg/dL — ABNORMAL HIGH (ref 70–99)

## 2011-01-23 LAB — URINALYSIS, MICROSCOPIC ONLY
Bilirubin Urine: NEGATIVE
Nitrite: NEGATIVE
Specific Gravity, Urine: 1.017 (ref 1.005–1.030)
Urobilinogen, UA: 0.2 mg/dL (ref 0.0–1.0)

## 2011-01-23 LAB — DRUGS OF ABUSE SCREEN W/O ALC, ROUTINE URINE
Benzodiazepines.: NEGATIVE
Creatinine,U: 146.7 mg/dL
Marijuana Metabolite: NEGATIVE
Opiate Screen, Urine: NEGATIVE
Propoxyphene: NEGATIVE

## 2011-01-23 LAB — PROTIME-INR
INR: 1.1 (ref 0.00–1.49)
Prothrombin Time: 14.1 seconds (ref 11.6–15.2)

## 2011-01-23 LAB — CARDIAC PANEL(CRET KIN+CKTOT+MB+TROPI)
CK, MB: 3.2 ng/mL (ref 0.3–4.0)
Relative Index: 1 (ref 0.0–2.5)
Relative Index: 1 (ref 0.0–2.5)
Relative Index: 1 (ref 0.0–2.5)
Total CK: 332 U/L — ABNORMAL HIGH (ref 7–232)

## 2011-01-23 LAB — APTT: aPTT: 24 seconds (ref 24–37)

## 2011-01-23 LAB — MAGNESIUM: Magnesium: 2.2 mg/dL (ref 1.5–2.5)

## 2011-01-24 LAB — CARDIAC PANEL(CRET KIN+CKTOT+MB+TROPI)
CK, MB: 2.4 ng/mL (ref 0.3–4.0)
Total CK: 767 U/L — ABNORMAL HIGH (ref 7–232)
Troponin I: <0.01 ng/mL (ref 0.00–0.06)

## 2011-01-24 LAB — BASIC METABOLIC PANEL
BUN: 14 mg/dL (ref 6–23)
Calcium: 8.6 mg/dL (ref 8.4–10.5)
Creatinine, Ser: 1.18 mg/dL (ref 0.4–1.5)
GFR calc non Af Amer: >60 mL/min (ref >60)
Potassium: 4.4 mEq/L (ref 3.5–5.1)

## 2011-01-24 LAB — CBC
HCT: 38.5 % — ABNORMAL LOW (ref 39.0–52.0)
Platelets: 175 10*3/uL (ref 150–400)
WBC: 5.8 10*3/uL (ref 4.0–10.5)

## 2011-01-24 LAB — GLUCOSE, CAPILLARY
Glucose-Capillary: 105 mg/dL — ABNORMAL HIGH (ref 70–99)
Glucose-Capillary: 72 mg/dL (ref 70–99)

## 2011-01-25 LAB — COMPREHENSIVE METABOLIC PANEL
ALT: 16 U/L (ref 0–53)
AST: 21 U/L (ref 0–37)
Albumin: 3.5 g/dL (ref 3.5–5.2)
Alkaline Phosphatase: 62 U/L (ref 39–117)
GFR calc Af Amer: >60 mL/min (ref >60)
Glucose, Bld: 223 mg/dL — ABNORMAL HIGH (ref 70–99)
Potassium: 4.3 mEq/L (ref 3.5–5.1)
Sodium: 139 mEq/L (ref 135–145)
Total Protein: 6.5 g/dL (ref 6.0–8.3)

## 2011-01-25 LAB — POCT I-STAT, CHEM 8
Chloride: 108 mEq/L (ref 96–112)
Creatinine, Ser: 1.3 mg/dL (ref 0.4–1.5)
Glucose, Bld: 120 mg/dL — ABNORMAL HIGH (ref 70–99)
HCT: 43 % (ref 39.0–52.0)
Potassium: 4.3 mEq/L (ref 3.5–5.1)
Sodium: 144 mEq/L (ref 135–145)

## 2011-01-25 LAB — DIFFERENTIAL
Eosinophils Absolute: 0.2 10*3/uL (ref 0.0–0.7)
Eosinophils Relative: 3 % (ref 0–5)
Lymphocytes Relative: 33 % (ref 12–46)
Lymphs Abs: 2.1 10*3/uL (ref 0.7–4.0)
Monocytes Relative: 9 % (ref 3–12)

## 2011-01-25 LAB — URINALYSIS, ROUTINE W REFLEX MICROSCOPIC
Bilirubin Urine: NEGATIVE
Hgb urine dipstick: NEGATIVE
Ketones, ur: NEGATIVE mg/dL
Nitrite: NEGATIVE
Protein, ur: NEGATIVE mg/dL
Specific Gravity, Urine: 1.039 — ABNORMAL HIGH (ref 1.005–1.030)
Urobilinogen, UA: 0.2 mg/dL (ref 0.0–1.0)

## 2011-01-25 LAB — RAPID URINE DRUG SCREEN, HOSP PERFORMED
Barbiturates: NOT DETECTED
Opiates: NOT DETECTED

## 2011-01-25 LAB — POCT CARDIAC MARKERS
CKMB, poc: 1.4 ng/mL (ref 1.0–8.0)
Myoglobin, poc: 127 ng/mL (ref 12–200)
Troponin i, poc: <0.05 ng/mL (ref 0.00–0.09)

## 2011-01-25 LAB — CBC
HCT: 39.8 % (ref 39.0–52.0)
MCHC: 32.3 g/dL (ref 30.0–36.0)
MCHC: 32.4 g/dL (ref 30.0–36.0)
MCV: 79.5 fL (ref 78.0–100.0)
MCV: 80 fL (ref 78.0–100.0)
Platelets: 158 10*3/uL (ref 150–400)
Platelets: 175 10*3/uL (ref 150–400)
RDW: 18.8 % — ABNORMAL HIGH (ref 11.5–15.5)
RDW: 19.1 % — ABNORMAL HIGH (ref 11.5–15.5)

## 2011-01-25 LAB — GLUCOSE, CAPILLARY
Glucose-Capillary: 135 mg/dL — ABNORMAL HIGH (ref 70–99)
Glucose-Capillary: 169 mg/dL — ABNORMAL HIGH (ref 70–99)
Glucose-Capillary: 199 mg/dL — ABNORMAL HIGH (ref 70–99)
Glucose-Capillary: 81 mg/dL (ref 70–99)
Glucose-Capillary: 98 mg/dL (ref 70–99)

## 2011-01-25 LAB — CARDIAC PANEL(CRET KIN+CKTOT+MB+TROPI)
CK, MB: 2.7 ng/mL (ref 0.3–4.0)
CK, MB: 3 ng/mL (ref 0.3–4.0)
Relative Index: 0.3 (ref 0.0–2.5)
Relative Index: 0.4 (ref 0.0–2.5)

## 2011-01-25 LAB — BASIC METABOLIC PANEL
BUN: 13 mg/dL (ref 6–23)
CO2: 31 mEq/L (ref 19–32)
Chloride: 107 mEq/L (ref 96–112)
Creatinine, Ser: 1.11 mg/dL (ref 0.4–1.5)
Glucose, Bld: 95 mg/dL (ref 70–99)

## 2011-01-25 LAB — BRAIN NATRIURETIC PEPTIDE: Pro B Natriuretic peptide (BNP): <30 pg/mL (ref 0.0–100.0)

## 2011-01-25 LAB — CK TOTAL AND CKMB (NOT AT ARMC)
Relative Index: 0.7 (ref 0.0–2.5)
Total CK: 331 U/L — ABNORMAL HIGH (ref 7–232)

## 2011-01-25 LAB — PROTIME-INR: INR: 1.2 (ref 0.00–1.49)

## 2011-01-26 ENCOUNTER — Other Ambulatory Visit: Payer: Self-pay | Admitting: Internal Medicine

## 2011-02-27 NOTE — Discharge Summary (Signed)
[name], [name]             ACCOUNT NO.:  [id number]   MEDICAL RECORD NO.:  [id number]          PATIENT TYPE:  INP   LOCATION:  3728                         FACILITY:  [facility]   PHYSICIAN:  Fransisco Hertz, M.D.  DATE OF BIRTH:  1940/12/05   DATE OF ADMISSION:  06/22/2008  DATE OF DISCHARGE:  06/25/2008                               DISCHARGE SUMMARY   DISCHARGE DIAGNOSES:  1. Mental status changes in the setting of fever and leukocytosis.  2. Right basal ganglion stroke.  3. 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  4. Type 2 diabetes.  5. Obstructive sleep apnea on CPAP.  6. History of bilateral cerebrovascular accident.  7. Hyperaldosteronism.  8. Mild congestive heart failure.  9. Diastolic dysfunction.  10.Hyperlipidemia.  11.Cerebral neuropathy.  12.Benign prostatic hypertrophy.   DISCHARGE MEDICATIONS:  1. NovoLog sliding scale.  2. Neurontin 600 mg 3 times daily.  3. Lantus insulin 15 units at 10, 3 and 9.  4. Spironolactone 25 mg twice daily.  5. Zocor 20 mg at nighttime.  6. Albuterol.  7. Protonix 20 mg 1 time daily.  8. Lisinopril 20 mg by mouth once daily.  9. Norvasc 5 mg p.o. daily.  10.Metoprolol 100 mg p.o. twice daily.   DISPOSITION FOLLOWUP:  The patient is being discharged to [facility]  placement.   DISCHARGE CONDITION:  Stable.  The patient will be seen at [facility] by Dr. Noel Gerold July 21, 2008 at 1:50 p.m.   PROCEDURE:  1. Chest x-ray; Impression:  Some interval decrease in left infrahilar      atelectasis or infiltrate.  2. CT head of the head without contrast; Impression:  No significant      acute abnormality.  3. Lumbar puncture with result of colorless CSF, clear clarity,      supernatant not indicated.  White blood cell count 14, red blood      cell count 13, segmented neutrophils 2, lymphocytes 90,      monocyte/macrophages in spinal fluid 8, eosinophils 0, other  cells      0.  Spinal fluid glucose 4, total protein 55.  Second chest x-ray      showed minimal bibasilar atelectasis and infiltrate seen.  No      consolidation is evident.   CONSULTATIONS:  Pharmacology.   HISTORY OF PRESENT ILLNESS:  A 70 year old man with history of diabetes,  multiple CVAs with recent right basal ganglion infarct and subarachnoid  hemorrhage on May 22, 2008 and recently discharged to [facility].  He is present with his wife.  The story is that he had bouts of  being incoherent and confused, believing he was somewhere else.  This  occurred while he was in the hospital as well, with these changes being  intermittent.  This morning he fell out of bed, and was agitated and  confused, he has also been awake.  He has not been coughing and he did  not have a Foley or a cath placement.  He had a temperature  of 101 when  he arrived to [facility] and had been having temperature there.  He  currently denies a headache and states he feels okay.  He believes he  had a bug bite on his right thigh recently.   PHYSICAL EXAMINATION:  VITAL SIGNS:  Temperature 100.1, blood pressure  131/65, pulse 92, oxygen saturation 95 on room air.  GENERAL:  Mildly uncomfortable but in no acute distress.  EYES:  EOMI, vision grossly intact.  NECK:  No JVD.  No carotid bruits.  RESPIRATION:  Mild crackles, right lower lung, otherwise clear to  auscultation, bilateral with good air entry.  CARDIOVASCULAR:  S1 and S2 positive.  No murmurs, rubs or gallops.  Regular, rate and rhythm.  GI:  Bowel sounds positive, soft and depressible.  No tenderness.  No  distention.  EXTREMITIES:  No edema.  GU:  No suprapubic pain or CVA tenderness.  SKIN:  Warm and dry.  LYMPH:  No lymphadenopathy.  NEURO:  Alert, active, oriented x4.  Cranial nerves II-III-XII intact.  PSYCH:  Appropriate.   ADMISSION LABS:  White blood cells 17.2, red blood cells 3.99,  hemoglobin 10.6, hematocrit 32.3, MCV  81.8, MCHC 32.8, RDW 16.4,  platelet count 346.  Neutrophils 80, lymphocytes 9, monocytes 10,  eosinophils 0, basophil 1, neutrophil absolute 13.8, lymphocyte absolute  1.5, monocyte absolute 1.7, eosinophil absolute 0.1, basophil absolute  0.1.  Glucose 159, sodium 140, potassium 4.4, chloride 106, CO2 25, BUN  20, creatinine 1.4, GFR 60, total bilirubin 0.8, alkaline phosphatase  88, AST 17, ALT 23, total protein 6.9, albumin blood 2.7, calcium 8.9,  creatinine kinase 129.  CK-MB 1.2, relative index 0.9, troponin 0.01,  troponin 1POC less than 0.05, CK-MB COC 1.4, myoglobin POC 358, thyroid  stimulating hormone 1.085.  Urinalysis:  Urine color yellow, appearance  cloudy, specific gravity 1.028, pH 6.0, glucose negative, bilirubin  small, ketones negative, small blood, protein 100, urobilinogen 1.0,  nitrate negative, leukocytes small, squamous epithelium, few hyaline  casts, white blood cells 3 to 6, bacteria few, other - mucus present.  VDRL in CSF nonreactive.  HIV HSV-1 DNA not detected. HSV-2 DNA not  detected. No organism growth on culture of cerebrospinal fluid.  Legionella antigen urine negative.  Blood culture no growth.  Urine  culture no growth.   HOSPITAL COURSE:  1. Acute mental status  in the presence of fever and leukocytes:      Acute mental status changes seemed to wax and wane during the first      day of admission.  On the seond day of admission, the patient was      alert, active and oriented x3.  We had multiple conversations and      the patient seemed to be coherent.  CT of the head was done to rule      out any new strokes.  CT of the head did not show any acute      abnormalities.  Not clear if presentation of acute mental status is      due to chronic changes due to multiple strokes.  Cardiac enzymes      were negative x3.  2. Fever and leukocytosis:  After giving the patient Tylenol, the      patient was afebrile throughout his course in the hospital.   Chest      x-ray showed possibilities of infiltrates.  Patient was given      antibiotics for the first couple of days in the hospital  and then      taken off antibiotics after infections were ruled out.  All studies      to rule out any negatives or any fever were inconclusive to      etiology of the fever.  The patient's course was stable throughout      his hospital stay.  He was closely monitored with telemetry and      there were no acute abnormalities found.  3. Hypertension:  Throughout the course of the hospital stay, the      patient's systolic blood pressure was at goal of 140 to 160 due to      the recent CVA.  His Metoprolol was held because of a new      bifascicular block, but continued upon discharge after all studies      have shown that beta-blocker usage would not cause any harm to the      patient.  4. Diabetes:  The patient was on NovoLog and Lantus.  The patient's      glucose levels were somewhat elevated throughout hospital stay.      Insulin was titrated at the lower glucose levels.  The patient will      be discharged on NovoLog and Lantus and he will see his primary      care physician for any further titration of insulin.  5. Hyperaldosteronism:  The patient was continued throughout hospital      stay with Spironolactone.  6. Recent CVA:  Due to his recent right basal ganglion, no      anticoagulation was used in hospital stay.   DISCHARGE LABS/VITALS:  Glucose 188. Vitals:  Temperature 98.6, pulse  78, respirations 18, blood pressure 167/82.  Oxygen saturation 97% on  room air.      Danne Harbor, MD  Electronically Signed      Fransisco Hertz, M.D.  Electronically Signed    RV/MEDQ  D:  06/25/2008  T:  06/25/2008  Job:  [id number]   cc:   Fransisco Hertz, M.D.

## 2011-02-27 NOTE — Consult Note (Signed)
[name], [name]             ACCOUNT NO.:  [id number]   MEDICAL RECORD NO.:  [id number]          PATIENT TYPE:  INP   LOCATION:  2915                         FACILITY:  [facility]   PHYSICIAN:  Melvyn Novas, M.D.  DATE OF BIRTH:  April 05, 1941   DATE OF CONSULTATION:  06/14/2008  DATE OF DISCHARGE:                                 CONSULTATION   REASON FOR CONSULTATION:  Stroke.   HISTORY OF PRESENT ILLNESS:  This is a 70 year old left American male  who recently underwent a right craniotomy clipping of right MCA  aneurysm.  Upon date of which he was to be discharged, the 27 of August,  the patient had a change in mental status, he is now bradycardic and  hypotensive. The CT scan was immediately taken which showed that he had  a new hypoattenuation of the right basal ganglia consistent with new  infarct.  The patient was then brought to the critical care unit and neurology  stroke service was consulted.  The patient, in addition to having a new infarct on CT, showed new  scattered small subarachnoid hemorrhages throughout the high right  frontal and parietal convexities.  Upon interview, the patient was  sitting in a chair.  He was alert but desoriented, appeared fidgety ,  restless and non- sense talkative.   PAST MEDICAL HISTORY:  1. Hypertension.  2. Diabetes.  3. Hyperlipidemia.  4. Past cerebrovascular accident.  5. OSA.  6. Decreased hearing.  7. Right rotator cuff surgery.  8. Left knee arthroscopy.   MEDICATIONS:  The patient is on Neurontin and aspirin, Lantus, Tiazac,  Aldactone, Lasix, Zocor, lisinopril, and metoprolol.   ALLERGIES:  AVANDIA AND ACTOS.   FAMILY HISTORY:  Positive for diabetes and CVA.   SOCIAL HISTORY:  The patient does not smoke nor drink.   REVIEW OF SYSTEMS:  A complete 12 point review of systems was covered  with the patient.  The patient states positive for weakness, cough,  shortness of breath, tremor, joint pains and stiffness,  arthralgia,  hypertension, diabetes, decreased hearing.   PHYSICAL EXAM:  This is a pleasant 67-year african American male.  Blood  pressure 168/84, pulse 109, respiratory rate 12.  ACTIVITY:  The patient is up as tolerated .  MENTAL STATUS:  The patient is alert.  He is oriented to time and place.  However, the patient was very confused with naming.  He has anomia with  multiple objects.  The patient was not able to name a pen, but he was  able to name what a pen is used for. He was unable to name a watch  calling the watch a pen.  When asked what you do with a watch, the  patient states that you do it.  The patient could not name a sink or  what you do with a sink.  The patient was unable to name a coin.  He was able to carry out two-  step  motor commands.  However, throughout carrying out the command, the  patient was extremely fidgety and could not sit still.    The patient was  unable to carry out serial seven calculation. Was  unable to any add a quarter a dime, and a nickel and a penny, but was  initially able to recall three objects.  However, 15 minutes later, the patient was unable to recall those same  three objects.   The patient had very little attention span.  CRANIAL NERVES:  Pupils are equal, round, reactive to light and  accommodating for approximately 5 to 3 mm bilaterally.  Conjugate gaze.  Extraocular muscles intact.  Visual fields were intact.  Face had a  slight droop on the right. Tongue was midline.  Uvula was midline.  Sensation- the patient states that there is decreased sensation over the  right portion of his forehead.  However, he did have a significant  amount of fluid underneath the dermis secondary to surgery which could  have obscured his sensation.  Shoulder shrug and head tilt are within normal limits.  COORDINATION:  Finger-nose- he was dysmetric on the left, normal on the  right.  Heel-to-shin was normal bilaterally.  Fine finger movements,  left  was slower than right.  The patient did order right around left.  GAIT:  The patient has tendency to fall to the right. He was unable to  hold posture. With eyes closed he tended to fall backwards and had  significant shuffling gait.  It took two people to hold the patient up  as the patient would fall without assistance.  Motor overall strength  however is 5/5 with good bulk and tone. With bilateral hand held out  straight, the patient did show a small tremor.  Deep tendon reflexes  bilateral downgoing toes,  1+ in the Achilles and 2+ in the patella, 2+  triceps and 1+ right brachial radialis,  1+ biceps.  Drift- The patient  has no drift bilaterally with the upper extremity and lower extremities.  Sensation- The patient has decreased sensation of the left leg. However,  he was equal to pin prick, light touch, vibration over bilateral hands,  bilateral upper extremities, chest and shoulder. As stated, V2 and V3  were equal bilaterally over face. However, V1 was decreased over the  right side of his face secondary to the fluid retention underneath the  dermis.  PULMONARY:  Clear to auscultation bilaterally.  No wheezing or rhonchi.  CARDIOVASCULAR:  S1-S2, regular rate and rhythm.  He did have a flow  murmur secondary to being tachycardiac.  NECK:  Negative bruits,  positively supple.   LABORATORY DATA:  Blood glucose was 114.  Sodium 148, potassium of 4.4,  chloride 109, bicarb 32, BUN 19, creatinine is 1.43.  White blood cell  count was 10.2. Hemoglobin is 10.1, hematocrit 31.5, platelets 200.  RDW  is 17.2.   IMAGING:  CT scan  of the head showed a new hypoattenuating in the right  basal ganglia section consistent with new ischaemic infarct.   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  Aphasia, non fluent versus delirium.   RECOMMENDATIONS:  1. No aspirin, clopidogrel or Aggrenox for  the next 4 to 6 weeks and      do not start any these medications until after a CT scan to show no      new subarachnoid hemorrhage sleep.  2. Keep systolic blood pressure between 160 and 180.  3. Physical therapy, occupational therapy, speech therapy and  respiratory therapy.  4. Very tight control of lipids and blood glucose as it has been      proven tight control over these parameters will prove a better      outcome.  At this point in time, the stroke team will follow this      patient.     ______________________________  Maryruth Bun, PA      Melvyn Novas, M.D.  Electronically Signed    DS/MEDQ  D:  06/14/2008  T:  06/14/2008  Job:  [id number]   cc:   Mick Sell, MD

## 2011-02-27 NOTE — Discharge Summary (Signed)
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  He has a history of cerebrovascular  disease and a long history of hypertension, diabetes, both for over 30  years.  He had a TIA in June and was found to have this aneurysm.  We  discussed the natural history of unruptured cerebral aneurysms with the  patient and because of concerns of difficulties with control of his  blood pressure, he felt that he would like to go ahead with surgical  obliteration of the aneurysm and the patient was admitted for craniotomy  and clipping of the aneurysm.  Further details of his history included  in his admission history and physical examination.   His exam at the time of admission showed a heavyset black male in no  acute distress.  General examination was, otherwise, unremarkable.  Neurologic examination showed he was awake and alert.  He was oriented  and follow commands.  He had good strength and sensation.   HOSPITAL COURSE:  The patient was admitted, underwent a right pterional  craniotomy and clipping of the aneurysm.  Postoperatively, he was awake  and alert with some mild disorientation.  He is restarted on a diet, and  his lines were discontinued.  The patient had been started on some  sedation and became less responsive.  The sedation was discontinued and  his sensorium cleared.  Physical therapy and occupational therapy  consultations were obtained and we requested a physical medicine  rehabilitation  consultation.  He continued to steadily improve each day  and he was felt to be a good candidate for inpatient rehabilitation, and  therefore, the patient was transferred to the inpatient rehabilitation  unit on the seventh postoperative day.  His incision was healing well.  He had been making good progress with therapies and it is felt to be a  good time to advance his therapies further.   DISCHARGE DIAGNOSES:  Right middle cerebral artery aneurysm, status post  clipping via right pterional craniotomy, longstanding history of  hypertension and diabetes, as well as a history of cerebrovascular  disease.      Hewitt Shorts, M.D.  Electronically Signed     RWN/MEDQ  D:  07/08/2008  T:  07/08/2008  Job:  [id number]

## 2011-02-27 NOTE — H&P (Signed)
NAMEJAHIR, [name]             ACCOUNT NO.:  [id number]   MEDICAL RECORD NO.:  [id number]          PATIENT TYPE:  INP   LOCATION:  3110                         FACILITY:  [facility]   PHYSICIAN:  Hewitt Shorts, M.D.DATE OF BIRTH:  03-Nov-1940   DATE OF ADMISSION:  05/27/2008  DATE OF DISCHARGE:                              HISTORY & PHYSICAL   HISTORY OF PRESENT ILLNESS:  The patient is a 70 year old right-handed  black male who is found to have an incidental asymptomatic unruptured  right middle cerebral artery aneurysm.  He has a history of  cerebrovascular disease as well as a long history of hypertension and  diabetes, both for over 30 years.  He experienced a TIA in June and  underwent evaluation with MRI and MRA.  It is suspected that he might  have a cerebral aneurysm and was evaluated further with a CT angiogram,  which confirmed the presence of a right middle cerebral artery aneurysm  and we subsequently consulted on the patient, obtained a 4-vessel  cerebral arteriogram which showed a solitary right middle cerebral  artery aneurysm and the patient is admitted now for craniotomy for  clipping of the aneurysm.   PAST MEDICAL HISTORY:  1. Long history of hypertension for over 30 years.  2. Long history of diabetes for over 30 years.  3. History of a stroke in 2007.  4. No history of myocardial infarction, cancer, peptic ulcer disease,      or lung disease.   PREVIOUS SURGERIES:  Right rotator cuff surgery 30 years ago, and left  knee arthroscopy 20 years ago.   He denies allergy to medications.   CURRENT MEDICATIONS:  1. Neurontin 600 mg t.i.d.  2. Aspirin 81 mg daily.  3. Lantus 40 units with breakfast and 40 units at bedtime.  4. Colace 100 mg b.i.d.  5. Tiazac (diltiazem ER 360 mg daily).  6. Spironolactone 25 mg b.i.d.  7. Simethicone 1 tablet q.i.d. p.r.n. for gas.  8. Metoprolol 100 mg b.i.d.  9. Lisinopril 40 mg daily.  10.Zocor 20 mg daily.  11.Lasix 40  mg daily.   FAMILY HISTORY:  Parents have passed away, mother from a stroke, father  from pneumonia.  His family history is well of hypertension and  diabetes.   SOCIAL HISTORY:  The patient is married and retired.  He sold and  serviced Scientist, research (life sciences).  He smoked beginning at age 70, but quit  around age 4.  He does not drink alcoholic beverages.  He denies  history of substance abuse.   REVIEW OF SYSTEMS:  Notable for those described in the history of  present illness and past medical history, was otherwise unremarkable.   PHYSICAL EXAMINATION:  GENERAL:  The patient is a heavy set black male  in no acute distress.  VITAL SIGNS:  Temperature 98.5, pulse 52, respiratory rate 20, blood  pressure 171/74, height 5 feet 6 inches, and weight 113 kg.  LUNGS:  Clear to auscultation.  He has symmetric respiratory excursion.  HEART:  Regular rate and rhythm.  Normal S1 and S2.  There is no murmur.  EXTREMITIES:  No clubbing, cyanosis, or edema.  NEUROLOGIC:  The patient is awake, alert, fully oriented.  His speech is  fluent, he has good comprehension.  Cranial nerves show pupils are  equal, round, and reactive to light and are about 3 mm bilaterally.  Extraocular movements are intact.  Facial movement is symmetrical.  Motor examination shows no drift in the upper extremities and strength  is 5/5 in the upper and lower extremities.  Sensation is intact to  pinprick to the upper and lower extremities.  Reflexes are absent in the  biceps, brachioradialis, triceps, minimal in the quadriceps, and absent  in the gastrocnemius.  They are symmetrical bilaterally.  Toes are  downgoing bilaterally.  He has a normal gait and stance.   IMPRESSION:  A 70 year old man with a significant comorbidities of long-  standing hypertension, diabetes for over 30 years each.  His  hypertension and diabetes have been somewhat poorly controlled.  He has  been found to have an incidental asymptomatic unruptured  right middle  cerebral artery aneurysm.   PLAN:  I spoke with the patient and his wife at length about the nature  of his lesion and its natural history of spontaenous rupture and the  associated morbidity and mortality that can be associated with that.  We  have discussed options for treatment including endovascular treatment as  well as craniotomy for clipping of the aneurysm.  We discussed the  nature of surgery and its risk including risk of infection, bleeding,  stroke including paralysis, coma, and death, and anesthetic risk of  myocardial infarction, stroke, pneumonia, and death.   After discussing all this, the patient would like to go head with  craniotomy for clipping of the aneurysm and is admitted for such.      Hewitt Shorts, M.D.  Electronically Signed     RWN/MEDQ  D:  05/27/2008  T:  05/28/2008  Job:  [id number]

## 2011-02-27 NOTE — Op Note (Signed)
NAMEKEANEN, DOHSE             ACCOUNT NO.:  [id number]   MEDICAL RECORD NO.:  [id number]          PATIENT TYPE:  INP   LOCATION:  3110                         FACILITY:  [facility]   PHYSICIAN:  Hewitt Shorts, M.D.DATE OF BIRTH:  06-19-1941   DATE OF PROCEDURE:  05/27/2008  DATE OF DISCHARGE:                               OPERATIVE REPORT   PREOPERATIVE DIAGNOSIS:  Right middle cerebral artery aneurysm.   POSTOPERATIVE DIAGNOSIS:  Right middle cerebral artery aneurysm.   PROCEDURE:  Right pterional craniotomy and clipping of aneurysm with  microdissection.   SURGEON:  Hewitt Shorts, MD   ASSISTANT:  1. Nelia Shi. Webb Silversmith, RN  2. Coletta Memos, MD   ANESTHESIA:  General endotracheal.   INDICATIONS:  The patient is a 70 year old man who as a part of workup  for a suspected TIA earlier this year was found to have a right middle  cerebral artery aneurysm.  The lesion is felt to be unruptured and  asymptomatic; however, he has significant history of hypertension which  at times is not well controlled as well as a longstanding history of  diabetes and decision was made to proceed with craniotomy and clipping  of the aneurysm.   PROCEDURE:  The patient was brought to the operating room, placed under  general endotracheal anesthesia.  The patient was placed on 3-pin  [place] head holder and a roll was placed beneath the right shoulder  and the head was gently turned to the left.  The right frontotemporal  scalp was shaved and then prepped with Betadine soap and solution and  draped in sterile fashion.  A curvilinear right pterional incision was  made.  The line of the incision was infiltrated with local anesthetic  without epinephrine.  Raney clips were applied to the scalp edges to  maintain hemostasis.  The scalp was refracted anteriorly and inferiorly.  The temporalis fascia was incised superiorly and anteriorly, and then  the temporalis muscle was reflected  posteroinferiorly and the pterional  region exposed.  Three burr holes were made using the X-Max drill.  The  dura was dissected from the overlying scalp and then the burr holes were  connected with the craniotome attachment and the bone flap elevated.  The edges of the bone were waxed as needed to maintain hemostasis and  then with magnification, we dissected the dura from the wing of the  sphenoid and we were able to progressively remove this sphenoid to  improve our subsequent intracranial exposure.  There is a moderate tear  of the dura from the craniotome because of its adherence to the skull  and later dural graft was placed over that dural defect.  The dura was  further opened, so that we can hinge the dura towards the pterion and  the right frontal and temporal lobes were exposed and identified.  The  operating microscope was draped and the Budde halo self-retaining  retractor system was setup.  The operating microscope was brought into  the field to provide additional navigation, illumination, and  visualization and the dissection was performed using microdissection and  microsurgical technique.  We identified the right sylvian fissure and  carefully with microdissection gained to open the arachnoid and  dissected into the sylvian fissure and we were able to open it from a  distal to proximal extent.  We then identified 2 large middle cerebral  artery branches and we followed those down into the depth of the fissure  and exposed the aneurysm.  We were able to continue the dissection  freeing up arachnoid adhesions between the aneurysm and the frontal lobe  and eventually we were able to dissect down and expose the neck of the  aneurysm and the 2 branching arteries and then subsequently we were able  to identify the apparent middle cerebral artery.  The aneurysm itself  was significantly arthrosclerotic with small, soft tics coming off it,  but the major portion of the aneurysm was  filled with atheroma.  We  could see blood swirling within the soft portion of the aneurysm and we  then spent a good bit of time planning the clipping of the aneurysm.  We  tried a number of different clips including a right angle 7.5-mm clip as  well as 2 gently curved 9-mm clips.  In the end, the 7.5-mm right angle  clip was applied across the neck of the aneurysm.  We used the  intracranial Doppler to confirm flow in the distal vessels and to  confirm the lack of flow within the dome of the aneurysm.  We then  punctured the dome of the aneurysm with a 25-gauge butterfly.  We were  able to aspirate the remaining blood contents, the aneurysm collapsed  and did not refill.  It was felt that the aneurysm had been obliterated  by the clipping and that distal flow had been maintained.  We then  irrigated the intracranial cavity.  The cottonoid patties which had been  applied throughout the procedure to protect the brain surfaces, were  gently irrigated and then lifted off the brain surface.  Good hemostasis  was established and confirmed and then we began to proceed with closure.  The dura was closed with feasible interrupted 4-0 Nurolon sutures,  however there was a dural defect left by the craniotome during the  initial exposure.  We used a piece of Duraguard which was laid over that  defect.  The intracranial cavity was filled with saline and then the  bone flap was replaced and secured to the skull with Lorenz cranial  plates.  We used 2 square plates and 1 burr hole cover.  They were  secured with 4-mm self-drilling screws.  We then approximated the  temporalis fascia, both across its superior and anterior limbs of the  fascial opening with 2-0 Vicryl sutures and then the galea  was closed  with interrupted inverted 2-0 undyed Vicryl sutures and the skin was  closed with surgical staples.  Wound was dressed with Adaptic and  sterile gauze.  The head was wrapped with a Kling and a  Kerlix.  Procedure was tolerated well.  The estimated blood loss was 350 mL.  Sponge and needle were correct.  Following surgery,  the patient was  reversed from the anesthetic, extubated, and transferred to the recovery  room for further care where he remained somnolent, but began to follow  commands.      Hewitt Shorts, M.D.  Electronically Signed     RWN/MEDQ  D:  05/27/2008  T:  05/28/2008  Job:  [id number]

## 2011-02-27 NOTE — Discharge Summary (Signed)
NAMEORVA, GWALTNEY             ACCOUNT NO.:  [id number]   MEDICAL RECORD NO.:  [id number]          PATIENT TYPE:  INP   LOCATION:  4705                         FACILITY:  [facility]   PHYSICIAN:  Alvester Morin, M.D.  DATE OF BIRTH:  12-26-1940   DATE OF ADMISSION:  07/25/2007  DATE OF DISCHARGE:  07/28/2007                               DISCHARGE SUMMARY   DISCHARGE DIAGNOSES:  1. Gait instability most likely secondary to chronic microvascular      ischemic changes, extensive, including in the brain and cerebellum.  2. Hypertension uncontrolled.  3. 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  4. Obstructive sleep apnea.  5. Diabetes mellitus type 2 with peripheral neuropathy.  6. Hyperaldosteronism primary.  7. Obesity.   DISCHARGE MEDICATIONS:  1. Neurontin 600 mg p.o. t.i.d.  2. Aspirin 81 mg p.o. daily.  3. Lantus 80 mg subcu q.h.s.  4. Colace 100 mg p.o. b.i.d.  5. Diltiazem ER 350 mg p.o. daily.  6. Eplerenone 50 mg p.o. b.i.d.  7. Simethicone q.i.d. p.r.n.   DISCHARGE DISPOSITION:  The patient was discharged to home, will be  ambulating with a single prong cane as instructed by the physical  therapist.  He will also prepare himself a tub seat and will go to the  [facility] outpatient rehab center for vestibular rehabilitation.  His  first appointment will be on October 27 at 10:45 in the morning.  He  will then follow up with his primary care physician, Dr. Silvestre Mesi in the  Outpatient Clinic on October 29 at 9:45 a.m.  No changes were made in  his medications during hospitalization.   PROCEDURES:  1. MRI/MRA of the head and neck:  This showed premature atrophy with      extensive chronic microvascular ischemic change affecting the brain      stem and cerebellum as well as periventricular and subcortical      white matter.  No acute stroke, and it also showed a tiny foci of      chronic hemorrhage in both thalami, likely hypertensive related.    There are also two separate aneurysms at the right MCA      trifurcation, one measuring 3 mm and the other measuring 4 mm, both      unruptured.  There was no significant proximal stenosis.  There was      also only MRA bilateral tandem stenosis of the junction of the      right and left cervical ICAs.  However, carotid Doppler was      negative for carotid stenosis.  2. Myoview.  There was a calculated EF of 54% with end-diastolic      volume of 102 mm and end-systolic volume of 47 ml, and there was a      fixed thinning of the inferior wall extending to the apex which was      consistent with either diaphragmatic attenuation artifact versus      __________ of the inferior wall.   ADMITTING HISTORY AND PHYSICAL:  Mr. Fish is a 70 year old African  American man with a past medical  history significant for the above-  listed problems who was admitted from the outpatient clinic on October  10 for complaint of dizziness when going from lying to standing.  The  history was obtained with him and his wife and was very variable, but it  seemed that the dizziness was mostly present when walking for at least  one year and may have been increased for the past few weeks.  He says it  does not feel like the room is standing, and does not exactly feel  lightheaded, and he feels like he is drunk.  He occasionally stumbles  when walking, although he denies any falls.  He denies chest pain,  headache, change in vision or loss of consciousness.  He was also  evaluated for the same problem one week prior in the outpatient clinic  for dizziness at which time his Hytrin was discontinued, and his Tiazac  was increased.   PHYSICAL EXAMINATION:  VITAL SIGNS:  Temperature 98.4, blood pressure  178/79, heart rate 90, respiratory 12, saturation of oxygen 91% on room  air.  GENERAL:  Was in no acute distress sitting in bed.  HEENT:  Pupils equal and reactive to light and accommodation with no  icterus or  conjunctival injection.  His oropharynx was clear, pink and  moist.  NECK:  Supple without lymphadenopathy or thyromegaly.  There was  no carotid bruits.  LUNGS:  He had distant breath sounds, but it was clear to auscultation  bilaterally.  HEART:  Distant heart sounds secondary to body habitus, but grossly  normal S1 and S2, unable to appreciate any murmurs, rubs or gallops.  ABDOMEN:  Soft, nontender, nondistended.  He had positive bowel sounds  and was obese.  He had trace edema of bilateral lower extremities.  NEUROLOGICAL:  He was alert and oriented x3.  His cranial nerves II-XII  were intact.  Strength was 5/5 in all extremities.  His sensation was  intact to light touch and pinprick.  He had a somewhat unsteady gait  with a broad-based gait.  His deep tendon reflexes were symmetric.  Finger-to-nose and rapid alternating movements were normal.  He had no  facial droop.  He did have slow speech but no slurring.   LABORATORY DATA:  WBC 6, hemoglobin 14.4, platelets 221.  Sodium 140,  potassium 4.6, chloride 100, CO2 35, BUN 19, creatinine 1.39, glucose  263, calcium 9.6.  Cardiac enzymes were negative.   HOSPITAL COURSE:  #1 - DIZZINESS/UNSTEADY GAIT.  It was very difficult  to differentiate if this was an acute versus subacute versus chronic  problems.  As stated above, the history was very variable.  He was  evaluated by occupational therapy who felt that he would benefit from  vestibular rehabilitation and was also evaluated by physical therapy who  felt that he did have a broad-based gait, but was otherwise fairly  stable, especially if ambulating with a cane.  We evaluated his symptoms  with the vitamin B12 level that was normal at 481 and RPR that was  nonreactive.  His hemoglobin A1c was elevated at 9.9, and he did have  some documented peripheral neuropathy in the outpatient clinic, although  this was not very significant on are limited exam in the hospital.  A  TSH was  normal at 1.719, and he was not orthostatic.  Radiology:  We did  get an MRI and MRA of the head and neck, which, as stated above, he did  have premature atrophy with  extensive chronic microvascular ischemic  changes, including the brain stem and cerebellum which could very well  be the cause of his symptoms.  He will be discharged to home with his  wife, and will be ambulating with a cane most times.  Will go to the  [facility] outpatient rehab center for vestibular rehabilitation, and  will follow up with his primary care physician, Dr. Silvestre Mesi at the end of  the month.  He was made aware of the diagnosis and of the treatment for  this which is good control of his diabetes, hypertension and  cholesterol.  These are outpatient issues that should be addressed by  his PCP.   #2 - EKG CHANGES.  During the hospitalization we got an EKG on Mr.  Saine which showed a new onset right bundle branch block which was  different when compared to previous one in the outpatient clinic.  He  denied any chest pain, shortness of breath, but given his risk factors,  we did repeat another EKG and cycled cardiac enzymes.  His enzymes were  normal throughout, but his EKG reverted back to normal with no right  bundle branch block.  We called Cardiology for their opinion given his  risk factors, and they did a Myoview of his heart which showed possible  inferior wall ischemia.  diaphragmatic attenuation artifact.  Otherwise  the Myoview was fairly normal with a EF of 54%. They opted not to pursue  further with any imaging catheterization.  The patient will continue his  aspirin at home.   #3 - DIABETES MELLITUS TYPE 2.  This is still not well controlled and  will be addressed as an outpatient.  In the meanwhile, he will continue  on 80 units q.h.s. of Lantus insulin.   #4 - HYPERTENSION.  His blood pressure during hospitalization stayed  elevated around the 160s to the 180s systolic.  He is currently on   hydrochlorothiazide, Tiazac and Inspra which is a Eplerenone.  This  regimen can be addressed as an outpatient, but I would consider adding  an ACE inhibitor or angiotensin receptor blocker as he is diabetic, if  this has not been tried in the past.  He will follow up with his PCP for  this issue.   DISCHARGE LABORATORIES AND VITALS:  Temperature 97.1, heart rate 85,  respiratory rate 20, blood pressure 178/97, saturation 92% on room air.  WBC 6.3, hemoglobin 14.2, platelet count 212.  Sodium 141, potassium 3.4  which was repleted before discharge, chloride 103, CO2 30, glucose 66,  BUN 21, creatinine 1.2, calcium 9.2.      Dellia Beckwith, M.D.  Electronically Signed      Alvester Morin, M.D.  Electronically Signed    VD/MEDQ  D:  07/28/2007  T:  07/29/2007  Job:  [id number]   cc:   Ronda Fairly, M.D.  [facility]

## 2011-02-27 NOTE — Discharge Summary (Signed)
NAMEBRITTAIN, HOSIE             ACCOUNT NO.:  [id number]   MEDICAL RECORD NO.:  [id number]          PATIENT TYPE:  INP   LOCATION:  3037                         FACILITY:  [facility]   PHYSICIAN:  Eliseo Gum, M.D.   DATE OF BIRTH:  May 04, 1941   DATE OF ADMISSION:  03/20/2008  DATE OF DISCHARGE:  03/25/2008                               DISCHARGE SUMMARY   DISCHARGE DIAGNOSES:  1. 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  2. Spinal stenosis at C3-C4 level and C4-C5 level.  3. Uncontrolled hypertension.  4. Type 2 diabetes.  5. Hyperlipidemia.  6. Obstructive sleep apnea on CPAP.  7. Gastroesophageal reflux disease.  8. Peripheral vascular disease.  9. Benign prostatic hypertrophy.  10.History of hyperaldosteronism.  11.Congestive heart failure with mild diastolic dysfunction.  12.History of bilateral thalamic cerebrovascular accidents.   DISCHARGE MEDICATIONS:  1. Lasix 40 mg p.o. daily.  2. Aspirin 81 mg p.o. daily.  3. Spironolactone 25 mg p.o. b.i.d.  4. Lantus 40 units subcu b.i.d.  5. Metoprolol 50 mg p.o. b.i.d.  6. Neurontin 600 mg p.o. t.i.d.  7. Cardizem 360 mg p.o. daily.  8. Zocor 20 mg p.o. nightly.  9. Lisinopril 40 mg p.o. daily.   DISPOSITION AND FOLLOWUP:  1. The patient is to follow up at the [facility]      with Dr. Onalee Hua.  The most prominent issue is to follow up at the      outpatient clinic will be to further improve blood pressure control      and diabetes control.  We also need to make sure at that time that      the patient has secured a followup appointment with Dr. Newell Coral.  2. The patient is also to follow up in 2 weeks with Dr. Newell Coral to      discuss potential surgical treatment of his right MCA aneurysm.   PROCEDURES:  Procedures performed during this hospitalization include;  1. CT head on March 20, 2008, consistent with no acute intracranial  abnormality, stable mild chronic vessel disease, and an old left      thalamic lacunar infarct.  2. MRI.  MRI of the head and neck on March 20, 2008, with no acute      intracranial findings but 2 separate right MCA trifurcation      aneurysms 3-mm and 5-mm, unchanged in size from October 2008.  3. The patient had a CT angiogram of the head on March 24, 2008,      consistent with an 8.5-mm right MCA bifurcation aneurysm.      Otherwise, unremarkable CTA of the circle of Willis.  4. The patient had a 2-D echocardiogram on March 22, 2008, with normal      left ventricular sytolic function and EF of 65%-70%.  No left      ventricular wall motion abnormalities.  Mildly increased left      ventricular wall thickness.   HISTORY OF PRESENT ILLNESS:  For full details, please refer to the  chart.  But in brief, [name] is a 70 year old African American man  who presented with left-sided face, arm, and leg numbness.  He had  fallen asleep around at 2:30 a.m. the night before, awoke around 5:30  with the numbness, and was seen in the ED around 10 in the morning.  His  wife reports some mild slurring of speech and slight left facial droop  soon after the patient awoke.  By the time he was seen in the ED, his  speech had returned to normal and he was only experiencing slight  numbness of his left foot.   LABORATORY DATA:  Upon admission, sodium 141, potassium 3.8, chloride  106, bicarb 27, BUN 19, creatinine 1.5 with a glucose 128.  WBC 7.9,  hemoglobin 13.1, hematocrit of 41.1, and platelets of 196.  Point of  care cardiac enzymes were negative.  EKG showed sinus rhythm with  nonspecific ST-T changes.   HOSPITAL COURSE:  1. His left-sided face, arm, and leg numbness.  Based on his many      significant risk factors for vascular disease including      uncontrolled hypertension, type 2 diabetes, hyperlipidemia, and      prior history of stroke with rare importance of posterolateral CVA,      he  had a CT head that showed no acute intracranial processes and an      MRI of the head and neck which showed no signs of acute ishemic      stroke.  However, the MRA of his head and neck revealed few small      right MCA aneurysms and it was felt that the recent left-sided      numbness could have been a TIA as a result of the emboli from these      aneurysms.  Neurosurgery suggested obtaining a CT angiogram of the      head to confirm the presence of these aneurysms.  This confirmed an      8.5-mm right MCA aneurysm.  Dr. Newell Coral from neurosurgery has been      contacted regarding these findings, and he has recommended      outpatient followup in his office.  2. Uncontrolled hypertension.  Initially with a blood pressure of      208/86.  His systolics throughout the hospitalization remained in      the 160s.  His home antihypertensives were continued and his      lisinopril dose was increased to 40 daily.  A trial of      hydrochlorothiazide was also started but it was subsequently      stopped secondary to rise in creatinine and finally Lasix 40 mg was      added to his regimen.  3. Reminder of his problems were not active throughout this      hospitalization.   DISCHARGE VITAL SIGNS:  Temperature 98.7, heart rate 78, blood pressure  systolic 137-190/diastolic is 04-54, and O2 sats of 95% on room air.   LABORATORY ON DAY OF DISCHARGE:  Sodium 142, potassium 3.9, chloride  104, bicarb 31, glucose 115, BUN 17, and creatinine 1.24.  WBC 7.6,  hemoglobin 12.9, and platelets of 229.      Peggye Pitt, M.D.   Electronically Signed     ______________________________  Eliseo Gum, M.D.    EH/MEDQ  D:  04/05/2008  T:  04/06/2008  Job:  [id number]   cc:   Hewitt Shorts, M.D.  Mariea Stable, MD  Christiane Ha  Noel Gerold, M.D.

## 2011-02-27 NOTE — Consult Note (Signed)
NAMEMARIUSZ, [name]             ACCOUNT NO.:  [id number]   MEDICAL RECORD NO.:  [id number]          PATIENT TYPE:  INP   LOCATION:  4703                         FACILITY:  [facility]   PHYSICIAN:  Verne Carrow, MDDATE OF BIRTH:  1940-12-13   DATE OF CONSULTATION:  01/12/2009  DATE OF DISCHARGE:                                 CONSULTATION   CONSULTING PHYSICIAN:  Steele Berg Phifer, MD   REASON FOR CONSULTATION:  Chest pain.   HISTORY OF PRESENT ILLNESS:  [name] is a pleasant 71 year old  African American male with multiple medical problems including diabetes  mellitus, hypertension, hyperlipidemia, obstructive sleep apnea, BPH,  primary hyperaldosteronism, prior aneurysm status post clipping in  August 2009, and bilateral CVAs in August 2009 following the aneurysm  repair, who presented to the emergency department in the early morning  hours of January 11, 2009, with complaints of 10/10 substernal chest  discomfort.  He describes this as an aching-type pain that did not  radiate. however, was associated with shortness of breath and  diaphoresis and was not relieved until he was given a sublingual  nitroglycerin in the emergency department.  He has had no recurrence of  his pain here in the hospital.  His CK level has been elevated; however,  his CK-MB fraction is not elevated and his troponin has been negative  x3.  His EKG showed no evidence of ischemia.   PAST MEDICAL HISTORY:  1. Diabetes mellitus.  2. Hypertension.  3. Primary hyperaldosteronism.  4. BPH.  5. Obstructive sleep apnea.  6. Hyperlipidemia.  7. Bilateral CVA in August 2009.  8. Questionable peripheral vascular disease.  9. Aneurysm status post clipping in August 2009.  10.Rotator cuff repair.  11.Knee surgery.   ALLERGIES:  1. ACTOS.  2. AVANDIA.   CURRENT MEDICATIONS:  1. Aspirin 81 mg once daily.  2. Coreg 12.5 mg twice daily.  3. Clonidine 0.2 mg twice daily.  4. Neurontin 600 mg 3 times  daily.  5. Lantus as directed.  6. Lisinopril 40 mg once daily.  7. Protonix 40 mg once daily.  8. Zocor 40 mg once daily.  9. Aldactone 25 mg twice daily.  10.Terazosin 5 mg once daily.  11.Senna once daily.   SOCIAL HISTORY:  The patient is married and lives here in [place].  He is a former smoker, but stopped smoking 25-30 years ago.  He denies  use of alcohol or illicit drugs.   FAMILY HISTORY:  The patient tells me he has no family history of  coronary artery disease.  His mother apparently died from a stroke.   REVIEW OF SYSTEMS:  As stated in the history present illness and is  otherwise negative.  The only complaint the patient currently has is  that of lower back pain.   The patient did have a prior ischemic workup in October 2008 at which  time, a Myoview scan was performed here at [facility] and  showed an ejection fraction of 54% with inferior wall thinning and no  evidence of ischemia.   PHYSICAL EXAMINATION:  VITAL SIGNS:  Temperature 97.7, pulse  70 and  regular, respirations 12 and unlabored, blood pressure 155/80, and  saturating 95% on room air.  GENERAL:  He is a pleasant, middle-aged, African American male in no  acute distress.  He is alert and oriented x3.  SKIN:  Warm and dry.  NEURO:  No focal deficits.  PSYCHIATRIC:  Mood and affect are appropriate.  HEENT:  Normal.  NECK:  No JVD.  No carotid bruits.  No thyromegaly.  No lymphadenopathy.  LUNGS:  Clear to auscultation bilaterally without wheezes, rhonchi, or  crackles noted.  CARDIOVASCULAR:  Regular rate and rhythm.  ABDOMEN:  Soft and nontender.  Bowel sounds are present.  EXTREMITIES:  Bilateral 1+ lower extremity edema that is nonpitting.   DIAGNOSTIC STUDIES:  1. Laboratory values show a potassium of 3.9, creatinine 1.1,      hemoglobin 11.7, hematocrit 36.1, platelets 158.  BNP less than 30.      Hemoglobin A1c 8.7.  D-dimer 0.68.  Most recent cardiac enzyme      profile with CK  of 97, CK-MB 3.0, and troponin less than 0.01.  2. CT angiogram of the chest negative for pulmonary embolism.  3. A 12-lead EKG shows sinus bradycardia with right bundle-branch      block and old inferior infarction.   ASSESSMENT AND PLAN:  This is a pleasant 70 year old African American  male with multiple cardiac risk factors including diabetes mellitus,  hypertension, hyperlipidemia, obesity, sedentary lifestyle, and former  tobacco abuse, who is admitted with complaints of chest pain.  He is  ruled out for myocardial infarction with 3 negative troponins.  His CK  is elevated, however, this does not appear to be from a cardiac  etiology.  The CK-MB fraction is normal.  His EKG has shown no ischemic  changes.  He has had no recurrent pains while here in the hospital.  Chest pain.  The patient has a high likelihood of coronary artery  disease but given the recent clipping of a brain aneurysm within the  last 12 months, I would prefer to have Neurosurgery evaluate him prior  to left heart catheterization in case we need to perform an angioplasty  and use Plavix, heparin, and Angiomax.  This will put this patient at a  significantly increased bleeding risk with his recent neurosurgical  procedure.  To risk stratify him, I would recommend with starting with  an adenosine Myoview stress test tomorrow.  If his stress test shows  ischemia, then we could confer with Neurosurgery and then proceed with a  left heart catheterization with possible angioplasty.  If there is no evidence of ischemia, then I would not recommend a  further cardiac workup, we would just recommend aggressive risk factor  modification.  I have spoken to Dr. Harvie Junior about this plan.  I will go  ahead and make the patient n.p.o. at midnight and will order a stress  test.  I will continue to follow with you.      Verne Carrow, MD  Electronically Signed     CM/MEDQ  D:  01/12/2009  T:  01/13/2009  Job:   [id number]

## 2011-02-27 NOTE — H&P (Signed)
[name], [name]             ACCOUNT NO.:  [id number]   MEDICAL RECORD NO.:  [id number]          PATIENT TYPE:  IPS   LOCATION:  4031                         FACILITY:  [facility]   PHYSICIAN:  Ellwood Dense, M.D.   DATE OF BIRTH:  [birth year]   DATE OF ADMISSION:  06/03/2008  DATE OF DISCHARGE:                              HISTORY & PHYSICAL   PRIMARY CARE PHYSICIAN:  [facility].   NEUROSURGEON:  Hewitt Shorts, M.D.   HISTORY OF PRESENT ILLNESS:  Mr. Corrales is a 70 year old African  American male with history of prior stroke along with insulin-dependent  diabetes mellitus and hypertension.   The patient was admitted on May 27, 2008, with a history of a recent  TIA in June 2009.  MRI studies showed an incidental finding of a right  middle cerebral artery aneurysm.  The patient was admitted for planned  surgery and underwent a right craniotomy for clipping of the aneurysm  with microdissection on May 27, 2008, by Dr. Newell Coral.  His staples  were removed on June 02, 2008.  He has had some occasional confusion,  with requirement for bilateral wrist restraints.  Morphine drip was  discontinued on May 31, 2008, secondary to confusion.  He completed a  Decadron taper.  Blood sugars have been monitored with Lantus insulin  use.  Blood pressure has been controlled with Cardizem, Aldactone,  Lopressor, lisinopril and Lasix.   The patient was evaluated by the rehabilitation physicians and felt to  be an appropriate candidate for inpatient rehabilitation.   REVIEW OF SYSTEMS:  Positive for lumbago.   PAST MEDICAL HISTORY:  1. History of prior stroke.  2. Insulin-dependent diabetes mellitus.  3. Hypertension.  4. Dyslipidemia.  5. Obstructive sleep apnea, with use of CPAP at home.  6. History of hearing loss.  7. History of right rotator cuff surgery.  8. Prior left knee arthroscopy.   FAMILY HISTORY:  Positive for stroke and diabetes  mellitus.   SOCIAL HISTORY:  The patient is married and retired.  His wife can  assist as needed.  He lives in a one-level home with 3 steps to enter.  He has remote tobacco usage history and denies alcohol usage.   FUNCTIONAL HISTORY PRIOR TO ADMISSION:  Independent using a cane.   ALLERGIES:  AVANDIA /ACTOS.   MEDICATIONS PRIOR TO ADMISSION:  1. Neurontin 600 mg t.i.d.  2. Aspirin 81 mg daily.  3. Lantus insulin 40 units subcu b.i.d.  4. Tiazac 360 mg daily.  5. Aldactone 25 mg b.i.d.  6. Lasix 40 mg daily.  7. Zocor 20 mg nightly.  8. Lisinopril 40 mg daily.  9. Metoprolol 100 mg b.i.d.   LABORATORY:  Recent hemoglobin was 10.8 with hematocrit of 33.0,  platelet count of 159,000 and white count of 16.5 on steroids.  Recent  sodium is 141, potassium 4.0, chloride 104, bicarbonate 32, BUN 24, and  creatinine 1.21.  Recent CBGs have been 126, 134, and 152 with steroid  taper.  The patient's recent hemoglobin A1c was elevated at 9.3.   PHYSICAL EXAMINATION:  GENERAL:  Reasonably  well-appearing, middle-aged  adult male seated up in a chair in mild to no acute discomfort.  VITAL SIGNS:  Blood pressure 128/70, with a pulse of 80, respiratory  rate 18, and temperature 98.0.  HEENT:  Well-healing cranial wound with partial scalp shaving on the  right side, with staples removed without drainage.  CARDIOVASCULAR:  Regular rate and rhythm, S1 and S2 without murmurs.  ABDOMEN:  Soft, obese, and nontender with positive bowel sounds.  LUNGS:  Clear to auscultation bilaterally with decreased breath sounds  at the bases.  NEUROLOGIC:  Moderately alert and oriented x1 with inability to name  place or time.  Extraocular muscles were questionably intact.  Tongue  was in the midline.  Facial symmetry was intact.  EXTREMITIES:  Bilateral upper extremity exam showed 3+ to 4- over 5  strength throughout.  Bulk and tone were normal.  Reflexes were 2+ and  symmetrical.  Sensation was intact to  light touch throughout the  bilateral upper extremities.  Lower extremity exam showed 3+ to 4- over  5 strength throughout.  Bulk and tone were normal.  Reflexes were 2+ and  symmetrical.  Sensation was intact to light touch throughout the  bilateral lower extremities.   DIAGNOSES:  1. Status post right middle cerebral artery aneurysm, with craniotomy      and clipping on May 27, 2008 by Dr. Newell Coral.  2. History of prior stroke/transient ischemic attack.  3. Hypertension, on multiple medications.  4. Insulin-dependent diabetes mellitus.  5. Dyslipidemia, on Zocor.  6. Obstructive sleep apnea, with no current continuous positive airway      pressure.   Presently, the patient has deficits in ADLs, transfers, and ambulation  along with higher-level cognition related to the above-noted aneurysm  and clipping.   The patient will be admitted to receive collaborative/ interdisciplinary  care between the physiatrist, rehab nursing staff, and therapy team.  The patient's level of medical complexity and substantial therapy needs  in context of medical necessity cannot be provided at a lesser intensity  of care.   PLAN:  1. The physiatrist will provide 24-hour management of medical needs as      well as oversight of the therapy plan/treatment and provide      guidance as appropriate regarding the interaction of the two.      Twenty-four-hour rehab nursing will assist in management of bowel      and bladder and medication administration and help integrate      therapy concepts, techniques, and education.  2. Physical Therapy will assess and treat for range of motion,      strengthening, bed mobility, transfers, pre-gait training, gait      training, and equipment evaluation.  3. Occupational Therapy will assess and treat for range of motion,      strengthening, ADLs, cognitive/perceptual training, splinting, and      equipment evaluation.  4. Rehab nursing for skin care and wound care  and bowel and bladder      training as necessary.  5. Case Management Social Work will assess and treat for psychosocial      issues and discharge planning.  6. Speech Therapy will evaluate for higher-level cognition along with      evaluation of swallow and oral motor exercises along with vital      stimulation as necessary.  7. Team conferences will be held weekly to establish goals, assess      progress, and determine barriers to discharge.   ESTIMATED LENGTH  OF STAY:  Eighteen to twenty-five days.   GOALS:  Standby assist to min assist ADLs, transfers, and ambulation.   PROGNOSIS:  Fair.           ______________________________  Ellwood Dense, M.D.     DC/MEDQ  D:  06/03/2008  T:  06/04/2008  Job:  [id number]

## 2011-02-27 NOTE — Discharge Summary (Signed)
NAMEEASTER, SCHINKE             ACCOUNT NO.:  [id number]   MEDICAL RECORD NO.:  [id number]          PATIENT TYPE:  INP   LOCATION:  4703                         FACILITY:  [facility]   PHYSICIAN:  Alvester Morin, M.D.  DATE OF BIRTH:  1941/02/14   DATE OF ADMISSION:  01/11/2009  DATE OF DISCHARGE:  01/13/2009                               DISCHARGE SUMMARY   DISCHARGE DIAGNOSES:  1. Chest pain in a patient with multiple coronary risk factors,      evaluated by Cardiology, Myoview done, which was negative for      ischemia and cardiac enzymes negative x3, negative chest CTA.  2. History of bilateral cerebrovascular accidents with prior clipping      of middle cerebral artery aneurysm.  3. Diabetes mellitus, type 2, with a hemoglobin A1c of 8.7, blood      sugars were well controlled during this hospitalization.  4. Hypertension, with a discharge blood pressure of 150/68.  5. Hyperlipidemia.  6. Benign prostatic hypertrophy.  7. Primary hyperaldosteronism  8. Mild diastolic dysfunction.  9. Chronic lower extremity edema.  10.Obstructive sleep apnea.  11.Peripheral neuropathy.   DISCHARGE MEDICATIONS:  1. P.o. Neurontin 600 mg t.i.d.  2. Subcutaneous Lantus 52 units q.a.m.  3. P.o. spironolactone 25 mg b.i.d.  4. P.o. Coreg 12.5 mg b.i.d.  5. P.o. lisinopril 40 mg daily.  6. P.o. terazosin 5 mg nightly.  7. P.o. Catapres 0.3 mg b.i.d., please note that this is an increase      in dosage from his home medication, which was 0.2 mg b.i.d.  8. Senokot p.r.n.  9. P.o. aspirin 81 mg daily.   CONDITION AT DISCHARGE:  The patient was stable and chest pain free.  The patient had a Myoview, which showed no evidence of myocardial  ischemia.  The patient had 3 sets of cardiac enzymes, all of which were  negative for myocardial ischemia and the patient had been evaluated by  the cardiologist, Dr. Clifton James.  I spoke with the [facility] nurse  practitioner, and she said that the patient was  suitable for discharge  following the negative myoview.  The patient will follow up with Dr.  Valetta Close of the Outpatient Clinic on February 01, 2009, at 1:50 p.m.  At the followup visit, please assess the patient for recurrent chest  pain.  It is also very important to risk reduce the patient for cardiac  disease.  Please try to maximize the patient's hypertension and diabetic  control.   PROCEDURES:  1. Adenosine Myoview on January 13, 2009, with the following impression:      Normal examination without evidence of pharmacologically induced      myocardial ischemia.  The calculated left ventricular ejection      fraction was 65%.  2. Chest x-ray, heart size upper limits of normal, no congestive heart      failure or active disease.  3. A CT angiogram of the chest with the impression as follows:      Technically adequate study without pulmonary embolus.   CONSULTATIONS:  Verne Carrow, MD of [facility].   ADMISSION  HISTORY AND PHYSICAL:  The patient is a 70 year old gentleman  with multiple chronic medical problems including difficulty to manage  type 2 diabetes, refractory hypertension, and in August 2009 clipping of  an MCA aneurysm with a resulting CVA and minimal deficits.  He comes  into the ED overnight complaining of 10/10 pounding substernal chest  pain with no radiation that awoke him up from sleep.  He was diaphoretic  and short of breath.  No nausea, vomiting, and diarrhea and no preceding  illness.  Pain is relieved with 1 dose of nitroglycerin in the ED.  No  reflux symptoms.  No pleuritic chest pain.  No recent travel or sick  contacts.   PHYSICAL EXAMINATION:  ADMISSION VITAL SIGNS:  Temperature of 97.6,  pulse of 72, blood pressure of 170/83.  GENERAL:  Alert and well developed, pleasant, on the stretcher, in no  apparent distress with his wife by his side.  HEENT:  Head, normocephalic and atraumatic.  Mouth, fair dentition.  LUNGS:  Normal  respiratory effort.  Normal breath sounds.  No crackles.  No wheezes.  HEART:  Normal rate, regular rhythm.  No murmur or distant heart sounds.  No murmurs, rubs, or gallops.  ABDOMEN:  Soft, nontender, normal bowel sounds.  No distension.  No  masses.  MUSCULOSKELETAL:  He moves all extremities.  EXTREMITIES:  A 2+ pitting edema in the bilateral lower extremities.  NEUROLOGIC:  Alert and oriented x3.  Cranial nerves II through XII  intact.  SKIN:  Turgor normal.  Color normal.  No rashes.  PSYCH:  Oriented x3.  Memory intact.   ADMISSION LABORATORY DATA:  Total CK of 331, CK-MB of 2.3, troponin of  less than 0.01.  UA negative.  Magnesium 2.4.  White blood cell count  6.3, hemoglobin 12.8, platelets of 175.   Initial BMET was as follows, sodium 141, potassium 3.9, chloride of 107,  bicarb 31, glucose 95, BUN of 13, creatinine of 1.11.   Initial D-dimer 0.68.   HOSPITAL COURSE:  1. Acute onset of chest pain:  The patient has multiple coronary risk      factors including uncontrolled diabetes, uncontrolled hypertension,      hyperlipidemia, and tobacco abuse history.  The patient had an      elevated D-dimer on admission of 0.68, and for this reason, a chest      CTA was performed, which was negative for pulmonary emboli.  The      patient had 3 sets of cardiac enzymes, all of which were negative      for myocardial ischemia.  The patient had an EKG that was negative      for any acute ischemic changes, although there were signs of old      inferior Q waves.  Dr. Clifton James of [facility] was consulted      and evaluated the patient.  He decided upon an adenosine Myoview to      risk stratify the patient.  The results of the adenosine Myoview      are as above, notable for no evidence of myocardial ischemia.      Given that the patient's chest pain was short-lived, he had ruled      out for an acute coronary event and had no signs of ischemia on      either EKG or myocardial  Myoview, and the patient had ruled out for      pulmonary emboli, he was fit for discharge.  Again,  the patient has      multiple coronary risk factors and needs to be medically managed as      best as possible to limit the patient's risk for future coronary or      cerebrovascular events.  The patient will follow up with Dr. Noel Gerold      in the outpatient setting for further medical management.  2. Hypertension:  The patient initially presented with a blood      pressure of 170/83 and remained elevated throughout the course of      his hospitalization with a peak of 189/74.  His blood pressure on      discharge was 150/68, and his home dose of clonidine was increased      from 0.2 mg b.i.d. to 0.3 mg b.i.d.  The rest of his medications      were kept the same, and he was discharged on these medications      including Coreg, lisinopril, Aldactone.  3. Diabetes mellitus, type 2:  The patient has a long-standing history      of diabetes, and his initial A1c was 8.7.  Since the patient was      made n.p.o., his home dose of Lantus was decreased from 52 units to      40 units nightly, and his blood sugars remained very well      controlled during his hospitalization.  He was returned to his home      dose of Lantus 52 units and will follow up in the Outpatient      Clinic.  4. Prior cerebrovascular accidents along with right middle cerebral      artery aneurysm clipping:  Given the question of whether the      patient could receive anticoagulants, we attempted to contact Dr.      Newell Coral of Neurosurgery who performed the patient's clipping.  An      associate of his, Dr. Channing Mutters, was spoken with and said that it is safe      to use any of these anticoagulants if the patient needed to undergo      heart catheterization.  However, given the patient's negative      Myoview, catheterization was not indicated.  The patient did not      have any focal neurologic signs or symptoms during his       hospitalization.  5. Mildly elevated CK:  The patient presented initially with a CK of      about 300 and it rose to a peak of about 900 and was trending down      during his hospitalization with a discharge CK of 758.  His other      cardiac enzymes were all negative including repeated negative      troponins, and given that his CK was never above 1000 and was      trending down, the possibility of rhabdomyolysis or other tissue      damage was very low.  He was informed to remain well hydrated and      to follow up in the clinic if he has any pain or discomfort in his      muscles.   DISCHARGE LABORATORY DATA:  Sodium 139, potassium 4.4, chloride of 103,  bicarb of 33, glucose 97, BUN of 14, creatinine of 1.18.  White blood  cell count 5.8, hemoglobin of 12.6, platelets of 175.   PENDING LABORATORY DATA:  There were no pending labs at this  time.      Linward Foster, MD  Electronically Signed      Alvester Morin, M.D.  Electronically Signed    LW/MEDQ  D:  01/13/2009  T:  01/14/2009  Job:  [id number]   cc:   Valetta Close, M.D.  Verne Carrow, MD

## 2011-02-27 NOTE — Discharge Summary (Signed)
[name], [name]             ACCOUNT NO.:  [id number]   MEDICAL RECORD NO.:  [id number]          PATIENT TYPE:  INP   LOCATION:  2019                         FACILITY:  [facility]   PHYSICIAN:  Fransisco Hertz, M.D.  DATE OF BIRTH:  1941-04-26   DATE OF ADMISSION:  06/12/2008  DATE OF DISCHARGE:  06/18/2008                               DISCHARGE SUMMARY   DISCHARGE DIAGNOSES:  1. Right ischemic basal ganglia stroke.  2. Right middle cerebral artery aneurysm, status post craniotomy and      clipping on August 13th.  3. Type 2 diabetes mellitus.  4. Obstructive sleep apnea, on CPAP.  5. History of bilateral thalamic cerebrovascular accidents.  6. Hyperaldosteronism.  7. Mild congestive heart failure.  8. Diastolic dysfunction.  9. Hyperlipidemia.  10.Peripheral neuropathy.  11.Benign prostatic hypertrophy.   DISCHARGE MEDICATIONS:  1. NovoLog sliding scale.  2. Neurontin 600 mg 3 times daily.  3. Lantus insulin 15 units subcutaneously at night.  4. Aldactone 25 mg twice daily.  5. Zocor 20 mg at nighttime.  6. Albuterol.  7. Protonix 40 mg 2 times daily.  8. Lisinopril 20 mg by mouth once daily.  9. Norvasc 5 mg by mouth once daily.  10.Metoprolol 100 mg twice daily.   DISPOSITION/FOLLOWUP:  Patient is being discharged to SNF placement.   CONDITION ON DISCHARGE:  Right ischemic basal ganglia stroke.  Patient  should continue aspirin in two weeks after CT of the head is done.   PROCEDURES:  1. CT of the head without contrast was done on June 13, 2008.  Impression:  1. New small amount of scattered subarachnoid blood over the high      right frontoparietal convexities.  2. New hypoattenuation of the right basal ganglia, consistent with new      infarct.  3. Postoperative changes of right frontal craniotomy, appeared stable.   CONSULTATIONS:  1. Neurology.  2. Speech therapy.  3. Physical therapy/occupational therapy.   HISTORY OF PRESENT ILLNESS:  Patient was on  inpatient rehab services  when he started to have increased mental status changes and hypotension,  bradycardia, acutely lethargic, hypoxic.  At approximately 3:45 p.m.,  teaching service was called for acute management.  Upon arrival of the  teaching staff, the patient was less hypotensive and more arousable.  The patient had not received any medications just prior to the event but  was on high doses of Diltiazem and metoprolol.  At this time, the  patient was admitted to the step-down unit.   LABORATORY ON ADMISSION:  The pH 7.316, pO2 83.3, pCO2 51.4, bicarbonate  25.5, TCO2 27.1, oxygen saturation 95.9. WBC 11.1, Hgb 10.9, Hct 34.4,  Plat 222, PT 13.6, INR 1.0, PTT 22, Na 138, K 6.1, Cl 105, Co2 28, Glu  192, BUN 54, Cr 3.17   HOSPITAL COURSE:  1. Acute mental status changes/bradycardia/hypotension/hypoxia: Most      likely due to new Right ischemic basoganglia infarct by CT of the      head.  Blood pressure medications were held as well as Neurontin,      Vicodin, spironolactone, and  Zocor.  Myocardial infarction was      ruled out with serial cardiac enzymes X 3, which were negative.      Infections where ruled out; Chest xray which did not show any      infiltrates, and blood cultures did not grow any organisms,      Urinalysis did not show any signs of infection. Electrolyte      abnormalities were also ruled out for causes of AMS with Bmet which      showed an increase in K which was corrected, and coagulation      studies where done.  Neurology, speech pathology, physical therapy,      and occupational therapy were consulted.  The Neurologist      recommended to hold off on Aggrenox treatment at this point.      Patient responded well to medical management. The patient's      systolic blood pressure was kept within 160-180.  The patient's      neurological status improved slowly day by day.  By the time of      discharge, the patient's physical dexterity had improved.   Strength      was 5/5.  He was alert, active, and oriented x3, but he continued      to have right facial droop, and decreased attention span. Patient      was clinically stable at the time of discharge. Will continue to      monitor on an outpatient basis.  2. Type 2 diabetes:  The patient's blood glucose ranged from 97-211.      The first days of admission, the patient was controlled with Lantus      47 units but the patient decreased eating, and Lantus was titrated      to maintain glucose levels to 90-130 fasting and less than 200      after eating.  As the patient progressed in hospital stay and      started to eat more, Lantus was titrated as needed.  3. Uncontrolled hypertension:  Patient's systolic blood pressure was      kept from 160-180 during the course of his hospital stay.  He was      started on Diltiazem 100 mg daily, which helped to keep systolic      blood pressures in ranges desired.  4. Anemia:  Patient was admitted with hemoglobin of 10. Hemoglobin did      not decrease further during admission. Patient did not have any      active bleeding. Would continue to monitor on an outpatient basis.  5. Hypernatremia:  On the second day of admission, patient showed to      have a sodium level of 147.  Hypernatremia resolved after slow      hydration with normal saline at 150 cc/hr.  By the third day of      admission, the patient's hypernatremia had resolved.   DISCHARGE LABS/VITALS:  Temperature 97.3, pulse 93, respirations 18,  blood pressure 160/70.  O2 saturation 93% on room air.   Glucose 219, sodium 139, potassium 3.9, chloride 107, CO2 26, BUN 15,  creatinine 1.37.  GFR 52.  Her non-American GFR for African American was  greater than 60.  Total bilirubin 0.7, alkaline phosphatase 70, AST 12,  ALT 18, total protein 6.4.  Albumin blood 2.9, calcium 9.   Pending labs:  Anemia panel.      Henry Harbor, MD  Electronically Signed  Fransisco Hertz,  M.D.  Electronically Signed    RV/MEDQ  D:  06/18/2008  T:  06/18/2008  Job:  [id number]

## 2011-02-27 NOTE — Discharge Summary (Signed)
NAMEELOHIM, BRUNE             ACCOUNT NO.:  [id number]   MEDICAL RECORD NO.:  [id number]          PATIENT TYPE:  INP   LOCATION:  2019                         FACILITY:  [facility]   PHYSICIAN:  Fransisco Hertz, M.D.  DATE OF BIRTH:  Jun 22, 1941   DATE OF ADMISSION:  06/12/2008  DATE OF DISCHARGE:                               DISCHARGE SUMMARY   INCOMPLETE OR CANCELED DICTATION   DISCHARGE DIAGNOSES:  1. AMS, hypotension, bradycardia.  2. Right basal ganglia stroke.  3. Hypertension.  4. Diabetes type 2.  5. Obstructive sleep apnea on CPAP.  6. Recent cerebrovascular accident.  7. Hyperaldosteronism.  8. Mild congestive heart failure, diastolic.  9. Hyperlipidemia.  10.Peripheral neuropathy.  11.Benign prostatic hypertrophy.  12.Acute renal failure.      Danne Harbor, MD       Fransisco Hertz, M.D.     RV/MEDQ  D:  06/18/2008  T:  06/18/2008  Job:  [id number]

## 2011-02-27 NOTE — Discharge Summary (Signed)
[name], [name]             ACCOUNT NO.:  [id number]   MEDICAL RECORD NO.:  [id number]          PATIENT TYPE:  INP   LOCATION:  4702                         FACILITY:  [facility]   PHYSICIAN:  Alvester Morin, M.D.  DATE OF BIRTH:  Nov 25, 1940   DATE OF ADMISSION:  03/10/2009  DATE OF DISCHARGE:  03/11/2009                               DISCHARGE SUMMARY   DISCHARGE DIAGNOSES:  1. Chronic lower extremity edema.  2. Hypertension.  3. Primary hyperaldosteronism.  4. Type 2 diabetes.  5. Obstructive sleep apnea.  6. Hyperlipidemia.  7. Peripheral neuropathy.   DISCHARGE MEDICATIONS:  1. Eplerenone 50 mg p.o. b.i.d.  2. Neurontin 600 mg p.o. t.i.d.  3. Aspirin 81 mg p.o. daily.  4. Lantus 52 units subcutaneously in the morning.  5. Coreg 25 mg p.o. b.i.d.  6. Simvastatin 40 mg p.o. daily.  7. Terazosin 5 mg p.o. daily.  8. Calcium vitamin D 500-400 mg p.o. tablet once daily.  9. Clonidine 0.3 mg p.o. b.i.d.  10.Lasix 40 mg p.o. daily.   DISPOSITION AND FOLLOWUP:  The patient will be discharged to [facility] in stable and improved condition.  He is to follow up  with Dr. Valetta Close in the [facility] on June 7 at  1:50 p.m. at which time he will need a basic metabolic panel to assess  his electrolyte status as well as a blood pressure check to assess  whether the recent increase in eplerenone has helped combat his chronic  hypertension.  Please feel free to adjust his eplerenone and Lasix dose  as indicated.   PROCEDURES PERFORMED:  1. Chest x-ray on Mar 10, 2009, shows no active disease.  2. Lower extremity Dopplers performed on Mar 11, 2009, shows no      evidence of acute DVT on preliminary report.   CONSULTATIONS:  None.   BRIEF ADMITTING HISTORY AND PHYSICAL:  The patient is a 70 year old  African American male with history of hypertension, primary  hyperaldosteronism, diabetes, hyperlipidemia, and chronic lower  extremity  swelling who presents to the [facility] with  complaint of increased weight gain and lower extremity swelling.  The  patient believes his lower extremity edema is worse than normal.  The  patient was recently started on Lasix and changed from spironolactone to  eplerenone because of painful gynecomastia.  The patient also describes  increased shortness of breath on exertion.  The patient states he feels  wounded after performing significant exertion such as weed digging  around his yard, but he does, however, denies chest pain, diaphoresis,  PND, and orthopnea.  Of mention, the patient was admitted to the [facility] on March 30 for chest pain and the workup revealed  it to be noncardiac chest pain.  Part of that workup included a Myoview,  which was entirely normal as well as a CT angiogram, which was normal.  The patient also had his cardiac enzymes cycled x3 and they were all  found to be within normal limit.   PHYSICAL EXAMINATION:  VITAL SIGNS:  Temperature  97.9, blood pressure  182/82, pulse 87, oxygen saturation 94% on room air.  GENERAL:  The patient is in no acute distress.  HEENT:  Eyes:  Pupils equal, round, and reactive to light.  Extraocular  movements intact.  Mouth:  Clear oropharynx.  LUNGS:  Faint bibasilar crackles.  The patient is able to speak in full  sentences; however, he desaturated to 89% on room air with ambulation in  the hallway in the [facility].  HEART:  Regular rate and rhythm.  No murmurs, rubs, or gallops.  ABDOMEN:  Obese, soft, nontender, nondistended.  Positive bowel sounds.  EXTREMITIES:  Bilateral 2+ pitting lower extremity edema.  No cords  palpated.  No evidence of lower extremity trauma.  Good distal pulses.  NEUROLOGIC:  Nonfocal.  PSYCH:  Appropriate.   LABORATORY DATA:  Sodium 146, potassium 4.0, chloride 105, bicarb 36,  BUN 17, creatinine 1.29, glucose 298, white blood cell count 6.6,  hemoglobin  12.9, platelets 192.  BNP of 79.  Initial set of cardiac  enzymes is negative.   HOSPITAL COURSE:  1. Chronic lower extremity swelling.  The patient was admitted to      [facility] evaluation of his lower extremity      edema.  In the [facility] outpatient clinic, there was some concern      for a 12-pound weight gain within the last month.  When examining      the clinic record, the patient's weight on day of admission was 248      pounds where as 1 month prior was 236 pounds; however, when      examining his weight in March, February, and January 2010, his      weights were in line with that of his weight at admission.  In      March 2010, weight was 245; in February 2010, weight was 248; in      January 2010, weight was 245 and these corresponded nicely to his      weight of 248.  On examining the patient's clinical chart, there is      mention of chronic lower extremity edema, although the patient did      have lower extremity edema noted on time of exam.  This was      something that was not new clinically, however, the primary team      attempted to diurese the patient overnight and also obtained a      lower extremity Doppler to evaluate for DVT.  The preliminary      report on the lower extremity Doppler was negative for any acute      clot.  In order to help combat the patient's hypertension, the      primary team felt appropriate to increase dose of eplerenone from      25 mg p.o. daily to 50 mg p.o. daily.  He will maintain his      additional blood pressure regimens as well as his Lasix 40 mg      daily.  He will need to be evaluated in the clinic for further      titration of this medication.  2. Dyspnea.  It was reported the patient had oxygen desaturation from      the mid 90s now to 89% on room air in the clinic with ambulation.      He was admitted and placed on O2 nasal cannula and maintained good  saturations throughout his entire  hospitalization.  As mentioned, a      lower extremity Doppler was obtained to evaluate for any evidence      of deep vein thrombosis, but the preliminary report was negative as      well.  The patient was not tachycardic throughout his      hospitalization.  He was not tachypneic throughout his entire      hospitalization.  Of mention, the patient had a Myoview on January 13, 2009, which showed no ischemia, ejection fraction of 65%.  In light      of this new Myoview, no further cardiac workup was done; however,      the patient did have cardiac enzymes drawn, which were found to be      within normal limits and did have serial EKGs, which showed no new      changes.  In light of his stable respiratory status throughout his      entire hospitalization, he will not be discharged home on any new      respiratory medications, only the increase in eplerenone.   DISCHARGE VITAL SIGNS:  Temperature 98.0, blood pressure 144/84, pulse  67, respirations 20, saturating 96% on room air.   DISCHARGE LABORATORY DATA:  Troponin 0.02.  White blood cell count 6.6,  hemoglobin 12.9, platelets 192.  Sodium 146, potassium 4.0, chloride  105, bicarb 36, BUN 17, creatinine 1.29, and glucose 298.   The patient was discharged home in stable and improved condition.      Genia Del, MD  Electronically Signed      Alvester Morin, M.D.  Electronically Signed    ZF/MEDQ  D:  03/14/2009  T:  03/14/2009  Job:  [id number]   cc:   Valetta Close, M.D.

## 2011-02-27 NOTE — Procedures (Signed)
EEG NUMBER:   CLINICAL HISTORY:  A 70 year old male admitted for loss of  consciousness, fever, acute mental status, hypertension, previous stroke  in June, and MCA aneurysm in August 2009, and also had diabetes,  bilateral thalamic CVA, hypoaldosteronemia, diastolic cardiac function,  and peripheral neuropathy.   CURRENT MEDICATION:  Tylenol, vancomycin, Zosyn, Lantus, Protonix,  Aldactone, Zocor, NovoLog, doxycycline, Zovirax, Haldol.   TECHNICAL COMMENT:  An 18-channel EEG was performed based on standard  international 10-20 system.  Total recording time 25.7 minutes, 17th  channel dedicated to EKG demonstrated normal sinus rhythm of 19 Hz.   This is a technically difficult study due to frequent motion artifact,  and electrode artifact.   Upon awakening, the posterior background activity was diffusely low  amplitude, 9 Hz reactive to eye opening and closure.  There was no  epileptiform discharge recorded.  Photic stimulation and  hyperventilation were not performed.  The patient was not able to  achieve sleep during recording.   CONCLUSION:  This is a normal yet technically difficult study due to  artifact.  There was no epileptiform discharge recorded.      Levert Feinstein, MD  Electronically Signed     [id number]  D:  06/23/2008 21:06:05  T:  06/24/2008 03:47:10  Job #:  [id number]

## 2011-02-27 NOTE — Discharge Summary (Signed)
[name], [name]             ACCOUNT NO.:  [id number]   MEDICAL RECORD NO.:  [id number]          PATIENT TYPE:  IPS   LOCATION:  4031                         FACILITY:  [facility]   PHYSICIAN:  Ellwood Dense, M.D.   DATE OF BIRTH:  Jan 19, 1941   DATE OF ADMISSION:  06/03/2008  DATE OF DISCHARGE:  06/12/2008                               DISCHARGE SUMMARY   DISPOSITION:  To Acute Care Services secondary to hypotension.   DISCHARGE DIAGNOSES:  1. 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  2. History of prior stroke with transient ischemic attack.  3. Hypertension.  4. Insulin-dependent diabetes mellitus.  5. Hyperlipidemia.  6. Obstructive sleep apnea.   This 70 year old African American male with history of stroke along with  insulin-dependent diabetes mellitus admitted on May 27, 2008, with  history of recent transient ischemic attack in June 2009.  MRI showed  incidental findings of a right middle cerebral artery aneurysm.  He was  admitted, planned for surgical and underwent right craniotomy, clipping  of aneurysm on May 27, 2008, per Dr. Newell Coral.  He had occasional  confusion with requirement for bilateral wrist restraints.  Morphine  drip was discontinued on May 31, 2008, secondary to confusion.  He  completed a Decadron taper.  Blood pressures were monitored on Cardizem,  Aldactone, Lopressor, lisinopril, and Lasix.  He was admitted for  comprehensive rehab program.   PAST MEDICAL HISTORY:  See discharge diagnoses.   SOCIAL HISTORY:  He is married, retired.  Wife can assist as needed.  They live in a one-level home with 3 steps to entry.  He has a remote  tobacco use.  Denies alcohol.   FUNCTIONAL HISTORY PRIOR TO ADMISSION:  Independent.   MEDICATIONS PRIOR TO ADMISSION:  1. Neurontin 600 mg three times daily.  2. Aspirin 81 mg daily.  3. Lantus insulin 40 units twice daily.  4. Tiazac 360 mg  daily.  5. Aldactone 25 mg twice daily.  6. Lasix 40 mg daily.  7. Zocor 20 mg at night time.  8. Lisinopril 40 mg daily.  9. Metoprolol 100 mg twice daily.   ALLERGIES:  AVANDIA and ACTOS.   PHYSICAL EXAMINATION:  GENERAL:  Well-appearing black male, in no acute  distress.  Well-healed craniotomy site.  CARDIOVASCULAR:  Regular rate and rhythm.  ABDOMEN:  Soft and nontender.  Good bowel sounds.  LUNGS:  Clear to auscultation.  The patient was moderately alert and  oriented x1 with inability to name place and time.   HOSPITAL COURSE:  The patient was admitted to Inpatient Rehab Services  with therapies initiated on a 3-hour daily basis consisting of physical  therapy, occupational therapy, speech therapy, and a 24-hour  rehabilitation nursing.  The following issues were addressed during the  patient's rehabilitation stay.  Pertaining to Mr. Lafalce's right  middle cerebral artery aneurysm clipping, surgical site healing nicely.  No signs of infection.  He did have restraints in place for safety  secondary to decreased awareness of deficits.  Latest CT of the head on  June 10, 2008, showed decreasing size of right subdural collection.  He did have a mild increase with subcutaneous scalp collection, which  Dr. Newell Coral was made aware of and monitored.  He was attending his  therapies as advised with planned discharge home on __________.  Family  teaching was to be initiated.  His diet was slowly advanced.  Blood  pressures were monitored closely.  Noted on June 12, 2008, the patient  with some increased altered mental status changes and hypotension.  Around 5:20 p.m., with systolic 85 and diastolic of 45.  Rapid Response  Team was consulted.  Blood pressure meds were held.  He was placed on  gentle hydration.   Follow up chemistries showed a potassium of 6.6.  No visible homolysis.  BUN 54 and creatinine of 3.17.  Gentle hydration was ongoing.  His  potassium steadily improved  to 4.3 and monitored.  Creatinine improved  to 2.07, as he had been transferred off rehab to Acute Care Service for  ongoing monitor per hospital staff.  He was becoming more alert.  He  would be readmitted back to the Inpatient Rehab Services once medically  stable and to resume Inpatient Rehab Services with ultimate goal to be  discharged to home.       Mariam Dollar, P.A.    ______________________________  Ellwood Dense, M.D.    DA/MEDQ  D:  06/14/2008  T:  06/14/2008  Job:  [id number]   cc:   Hewitt Shorts, M.D.  Ellwood Dense, M.D.

## 2011-02-27 NOTE — Consult Note (Signed)
[name], [name]             ACCOUNT NO.:  [id number]   MEDICAL RECORD NO.:  [id number]          PATIENT TYPE:  INP   LOCATION:  3037                         FACILITY:  [facility]   PHYSICIAN:  Darnelle Bos, MDDATE OF BIRTH:  07-Jun-1941   DATE OF CONSULTATION:  03/20/2008  DATE OF DISCHARGE:                                 CONSULTATION   REASON FOR CONSULTATION:  TIA, with abnormal MRI showing right MCA  aneurysm.   HISTORY AND FINDINGS:  [name] is a 70 year old right-handed  African American male who came in to the hospital today because of left-  sided numbness.   [name] woke up in the morning with left-sided face, arm, and leg  numbness.  He says that he had some neck pain intermittently since  yesterday and today.  He denies any weakness, but the family says that  he had some drooping of upper and lower face with difficulty of speech.  Later when he arrived at the hospital, he noted some dizziness and  unsteadiness in his gait.  He denies any chest pain, shortness of  breath, or palpitation.  Denies any arm or leg weakness.  Denies any  bowel or bladder problems.  Denies any vertigo.  On arrival in the [facility] Emergency Room, he symptoms almost disappeared, but he still  currently says that he has some intermittent areas of numbness in the  left side especially on the face.   He denies any symptomatology yesterday night.  He has had some  intermittent episodes of elevated blood pressure, but the last time he  checked was a week ago when the systolic within 150s.  Of note when the  patient arrived to the hospital in the emergency room, his blood  pressure systolic was in the 200s.   PAST MEDICAL HISTORY:  1. Diabetes mellitus.  2. Hypertension.  3. History of bilateral thalamic hemorrhagic CVA, slightly very      hypertensive.  4. Hyperlipidemia.  5. Congestive heart failure with mild systolic dysfunction.  6. History of lumbosacral degenerative  disk disease with intermittent      low back pain.  7. Obstructive sleep apnea on CPAP.  8. Gastroesophageal reflux disease.  9. Cataract.  10.History of claudication.  11.History of hearing loss.  12.History of dizziness.   PAST SURGICAL HISTORY:  Right rotator cuff and left knee surgery.   SOCIAL HISTORY:  The patient is currently retired.  Does not smoke or  drink any alcohol or use any drug.   ALLERGIES:  AVANDIA and ACTOS.   MEDICATIONS AT HOME:  He is on;  1. Aspirin 81 mg once a day.  2. Colace 100 mg twice a day.  3. Glucophage 500 mg twice a day.  4. Inspra 50 mg twice a day.  5. Lantus 40 units twice a day.  6. Lisinopril 20 mg once a day.  7. Metoprolol 50 mg twice a day.  8. Neurontin 600 mg 3 times a day.  9. Simethicone 40 mg 4 times a day as required.  10.Tiazac 360 mg once a day.  11.Zocor 20 mg once  a day.   FAMILY HISTORY:  History of cerebrovascular disease, diabetes, and  hypertension.   REVIEW OF SYSTEMS:  All systems were reviewed. The pertinent positives  include intermittent numbness in the hands and feet, intermittent  unsteadiness, and gait without any vertigo.  Rest of the review of  systems was unremarkable.   PHYSICAL EXAMINATION:  VITAL SIGNS:  His latest vitals showed blood  pressure of 196/93, temperature 98, pulse 68, respirations 16, and he is  saturating 95% on room air.  Again on arrival in to the hospital, his  blood pressure was 208/86 with the pulse of 78.  GENERAL:  On examination, [name] is a pleasant gentleman, in no  acute distress.  HEENT:  Normocephalic and atraumatic.  NECK:  Supple.  No carotid bruits.  CARDIOVASCULAR:  Regular rhythmic rate.  LUNGS:  Clear to air entry.  EXTREMITIES:  Edema 2+, no cyanosis or clubbing.  NEUROLOGIC:  The patient is awake, alert, and oriented.  No dysarthria  or aphasia.  Normal naming and reputation.   Cranial nerves II through XII were intact except for mild left droop,  but no  eye closure or weakness.  No jaw closure or weakness.  No facial  asymmetry other than the mild droop, which the family thinks is new.  Facial sensory evaluation revealed mild decrease pinprick in V1, V2, and  V3 distribution without any change to temperature upon vibration.  He  has decreased hearing with the use of hearing aids.  Tongue was midline  without any weakness or atrophy.  Bilateral elevation was asymmetric.  Neck, normal bulk and tone.  Evaluation of neck muscles was limited due  to pain, but no weakness was appreciated.  Strength was 5/5 proximally  and distally in arms and the legs.  Neck sensory evaluation revealed  normal sensation to pinprick except for patchy decrease in pinprick on  the left side in the mid forearm and the thorax region and some patches  in the left leg, which were nondermatomal in distribution.  Vibration  was mildly decreased and pinprick was mildly decreased in stocking  distribution.  Normal position.  Normal finger-to-nose testing and knee-  to-heel testing.  Gait was mildly hesitant, but no ataxia.  The patient  sways, but does not fall making it Romberg's negative.   The MRI of the brain was reviewed.  No acute infarct was seen, but he  does have evidence of old bilateral thalamic hemorrhages, chronic  hemorrhages, probably hypertensive, also has evidence of moderate-to-  severe atrophy and advanced chronic microvascular periventricular white  matter changes.  On the MRA, two aneurysms were noted in the right MCA  measuring at 4 mm and 3 mm and these were noted to be unchanged from the  exam in October 2008.  MRA of the neck was unremarkable.   LABORATORY DATA:  Labs that were performed today were reviewed.  CK 180,  CK-MB 2, and troponin 0.01.  CMP showed a sodium 139, potassium 3.9,  chloride 104, bicarb 29, glucose 205, BUN 15, and creatinine 1.2.  Liver  function test was within normal limits.  INR was 0.9 and PTT was 26.  CBC showed  hemoglobin 13.1, hematocrit 41, white count 7.9, and  platelets 196.   IMPRESSION:  Left-sided numbness including face, arm, and leg involving  upper and lower face, which was transient, which predominantly resolved,  but he does notes some persistent symptoms with negative MRI.  The  etiology of this could  be hypertensive related versus transient ischemic  attack versus a high cervical spine disease, which can sometimes cause  symptoms including the face.  The MRI being negative makes it unlikely  to be a stroke.  The question was raised whether these can be related to  the middle cerebral artery aneurysms and maybe an embolic phenomenon  from the aneurysm.  If it is hemisensory deficit middle, it will  localized to the thalamus, which is predominantly supplied by the  posterior circulation making it unlikely that this is from the middle  cerebral artery aneurysm.  Again, these aneurysms are extremely small,  which would make it unlikely to cause any symptoms too.  So, at this  time, the most likely cause of all these episodes is hypertensive with  worsening of symptomatology due to prior cerebrovascular accidents in  bilateral thalamus.   So, our recommendation will include:  1. Continuing the aspirin for secondary stroke prophylaxis.  2. Continuing risk factor management as he is noted to be hypertensive      and better control of the blood pressure along with his diabetes.  3. Regarding the aneurysms, I think at this time the best approach      would be conservative with observation as the suspicion that these      aneurysms responsible for the symptoms are low, but if he has      recurrent symptoms, then may be consider re-evaluating him.  4. We can also consider MRI of the cervical spine as he does have      history of degenerative disk disease, especially in his back and he      has been complaining of intermittent neck pain and there is some      report that the symptoms got  worse with movement of the neck.      Again, as mentioned above high cervical spine lesions can sometimes      affect the trigeminal nucleus and COG sensory deficits including      the face though it is very rare.  5. The plan was explained to the patient.  Neurology service will      follow this patient in the morning to see if any further      recommendations are needed.      Darnelle Bos, MD  Electronically Signed     RB/MEDQ  D:  03/20/2008  T:  03/21/2008  Job:  [id number]

## 2011-02-27 NOTE — Consult Note (Signed)
NAMEQUAVION, BOULE             ACCOUNT NO.:  [id number]   MEDICAL RECORD NO.:  [id number]          PATIENT TYPE:  INP   LOCATION:  4705                         FACILITY:  [facility]   PHYSICIAN:  Luis Abed, MD, FACCDATE OF BIRTH:  1941/07/29   DATE OF CONSULTATION:  07/26/2007  DATE OF DISCHARGE:                                 CONSULTATION   Mr. Ander is admitted to [facility] with some dizziness and he is  having a complete workup.  It is noted that his EKG is abnormal and we  are asked to see him.  The patient has been evaluated in the past for an  abnormal EKG.  In 2006 he had inferolateral ST changes.  At that time he  had an echo showing LVH.  He had normal systolic LV function.  He also  had a Myoview scan which showed no significant ischemia.  Historically,  the patient has hypertension.  He also has severe obstructive sleep  apnea.  He is followed by Dr. Craige Cotta.  He also has restrictive pulmonary  findings that may be related to his obesity.  Additional problems  include diabetes.   This admission, the first EKG shows right bundle branch block.  This is  a change from the past.  However, further EKG shows that he has an  intermittent right bundle branch block.  His EKG when his QRS is narrow  looks very similar to his prior EKG, with inferolateral ST changes.  He  also has Q-waves in leads 3 and F and these have been seen in the past.   The patient also has some shortness of breath and he has had this over  time.   The patient's dizziness may be worse at this time, but he has had this  problem chronically.  I doubt a cardiac basis for this at this time.   PAST MEDICAL HISTORY:  Allergies:  No known drug allergies.  Medications:  1. Neurontin 600 3 times a day.  2. Aspirin 81.  3. Lantus 80 at night.  4. Colace.  5. Long-acting Diltiazem 360 mg.  6. Simethicone  7. ?Tableranone?   SOCIAL HISTORY:  The patient is married.  He lives in [place].  He  smoked  in the past and quit 20 or 30 years ago.  He does not drink  alcohol or use other drugs.   FAMILY HISTORY:  There is history of vascular disease but no history of  MI's at a young age.   REVIEW OF SYSTEMS:  Today he is stable in the room.  He has had some  fatigue but feels okay today.  He has had only rare shortness of breath.  He is not having any GI or GU symptoms.  He complains of dizziness and  this is outlined in the workup by the other teams.  Otherwise, his  review of systems is negative.   PHYSICAL EXAMINATION:  Blood pressure is 160/70 with a pulse of 82.  Temperature is 97.9.  O2 saturation on room air is 94%.  The patient is  oriented to person, time and place.  Affect is normal.  He is  significantly overweight.  HEENT:  Reveals no xanthelasma.  He has normal extraocular motion.  NECK:  There are no carotid bruits.  There is no jugular venous  distention.  LUNGS:  Clear.  Respiratory effort is not labored.  CARDIAC:  Reveals S1 with S2.  There are no clicks or significant  murmurs.  ABDOMEN:  Obese.  He does have normal bowel sounds.  EXTREMITIES:  The patient does have trace or 1+ peripheral edema.   EKGs reveal that 1 tracing shows right bundle branch block.  The other  tracing shows tracing with a narrow QRS similar to the past, as outlined  above.  In this, there are inferolateral ST changes that is compatible  with his hypertensive disease.  He also has Q-waves in leads 3 and F.  This is not well explained but has been seen in the past when his LF  function was normal.   LABORATORY DATA:  Hemoglobin is 14, BUN is 20, with a creatinine of  1.35.  His troponins this admission have been normal.  TSH is normal.  At this time I do not see the chest x-ray report.   IMPRESSION:  1. History of hypertensive cardiac disease.  2. Severe obstructive sleep apnea.  Also history of restrictive      pulmonary findings.  The patient has been on C-PAP and he has been       followed at times by the pulmonary doctors in the [place] team.  It      may be helpful to have early followup with them.  The last note      available to me from them is in January of 2007.  3. Diabetes.  4. Intermittent right bundle branch block.  There is no reason to work      this up any further.  5. History of moderate left ventricular hypertrophy.  Hypertension      control of course will be quite important.  6. Dizziness.  The current evaluation is ongoing.  I do not think that      this is on a cardiac basis.  7. Abnormal EKG.  The current EKG reveals a narrow QRS which is      similar to the tracing in 2007.  There are inferolateral ST changes      compatible with left ventricular hypertrophy.  Decreased R-waves in      leads 3 and F have been seen in the past.  8. Question of mild volume overload.  There is mild edema at this      time.  I do not have a chest x-ray report at this time.  I wonder      if mild diuresis might help him overall.  I have not pushed for      this as I do not want to do anything that could increase the      possibility of dizziness.   SUGGESTIONS:  1. 2D echo.  This has already been ordered.  If wall motion remains      normal and if his Myoview shows no ischemia, I would not work up      his cardiac status any further.  2. Adenosine Myoview at [facility], I have ordered.  3. Consider Lasix to see if his shortness of breath improves at all      with mild diuresis, if he remains stable from this relative to his      dizziness.  4. Optimize C-PAP and consider further C-PAP compliance and pulmonary      followup.  5. Find the patient's chest x-ray.   We will be happy to follow the patient with you.  The patient has seen  Dr. Simona Huh as an outpatient and he can be followed by Dr. Diona Browner  if other cardiac followup is needed.      Luis Abed, MD, [facility]  Electronically Signed     JDK/MEDQ  D:  07/26/2007  T:  07/26/2007  Job:  [id number]    cc:   Alvester Morin, M.D.  Jonelle Sidle, MD  Coralyn Helling, MD

## 2011-03-02 NOTE — Discharge Summary (Signed)
[name], [name]             ACCOUNT NO.:  [id number]   MEDICAL RECORD NO.:  [id number]          PATIENT TYPE:  INP   LOCATION:  4704                         FACILITY:  [facility]   PHYSICIAN:  Madaline Guthrie, M.D.    DATE OF BIRTH:  05/23/1941   DATE OF ADMISSION:  04/16/2006  DATE OF DISCHARGE:  04/19/2006                                 DISCHARGE SUMMARY   DATE OF ADMISSION:  April 16, 2006   DATE OF DISCHARGE:  April 19, 2006   DISCHARGE DIAGNOSES:  1. Severe undercontrolled hypertension with no evidence for hypertensive      crisis.  2. Obstructive sleep apnea.  3. Type 2 diabetes mellitus.  4. Diabetic neuropathy.  5. Congestive heart failure with mild diastolic dysfunction, ejection      fraction 65% to 70% with left atrial dilation.   DISCHARGE MEDICATIONS:  1. Clonidine 0.3 mg b.i.d.  2. Lisinopril 40 mg daily.  3. Metoprolol 50 mg b.i.d.  4. Neurontin 300 mg t.i.d.  5. Metformin 850 mg b.i.d.  6. Lantus 100 units nightly.  7. Zocor 40 mg daily.  8. Aspirin 81 mg daily.  9. CPAP machine with 20 cm of water A-flex mode nightly.   HOSPITAL FOLLOWUP APPOINTMENTS:  Dr. Sherlon Handing on April 25, 2006, at 2 p.m.   CONSULTATIONS:  None.   PROCEDURES:  2D echocardiogram demonstrating mild diastolic dysfunction with  ejection fraction 65% to 70% with LV thickness and left atrial dilation.   HISTORY OF PRESENTING ILLNESS:  Mr.  [name] is a 70 year old African  American gentleman with diabetes x20 years, hypertension for 15 years, and  sleep apnea for 9 months, who presented to the outpatient clinic with labile  blood pressures, the last one recorded at 244/110 two days prior to  admission.  He denied chest pain, abdominal pain, urinary symptoms, but did  endorse a headache the day of admission, which had essentially resolved.  He  denied any visual changes, no dizziness, no syncope, no weakness, no slurred  speech, but did endorse shortness of breath on exertion for 2 months  with  chronic bilateral lower extremity edema.  Of note, the patient was on a  horrendous amount of medications to include.   HOME MEDICATIONS:  1. Clonidine 0.3 mg b.i.d.  2. Carvedilol 25 mg b.i.d.  3. Atacand 32 mg daily.  4. Amlodipine 10 mg daily.  5. Hydralazine 25 mg t.i.d.  6. Lasix 40 mg b.i.d.  7. Lisinopril 20 mg daily.  8. Aspirin 81 mg daily.  9. Metformin 850 mg t.i.d.  10.Starlix 120 mg q.a.c.  11.Lantus 70 units daily.  12.Crestor 10 mg daily.  13.Gabapentin 300 mg t.i.d.  14.Lamisil 250 mg as needed.  15.Nasonex 2 sprays q. nostril.  16.Benicar HCT 1 tablet daily.   ADMISSION PHYSICAL EXAM:  VITAL SIGNS:  Temperature 98.2, blood pressure  187/83.  Repeat manual blood pressures demonstrated in the right arm 175/93,  and on the left arm 160/84.  Pulse 74, respirations 91% on room air with a  weight of 245 pounds.  GENERAL:  The patient was obese, alert, oriented,  in no acute distress.  HEENT:  Essentially unremarkable.  There was no adenopathy or thyromegaly.  LUNGS:  Clear to auscultation bilaterally with no appreciable rhonchi or  wheezes.  CARDIOVASCULAR:  Showed a regular rate and rhythm with a grade 2/6 systolic  ejection murmur.  ABDOMEN:  Soft, nontender, nondistended, with no hepatosplenomegaly.  EXTREMITIES:  Showed 2+ bilateral edema to the knees with some crepitus, but  good pulses distally.   The rest of exam essentially unremarkable.   HOSPITAL COURSE:  Problem #1:  Severely uncontrolled hypertension.  Given  the tremendous amount of medications this patient has had, there is a  question whether noncompliance is an issue, although the patient's wife  states that she was giving him medications appropriately.  The patient, in  the past, has had workup to include MRI/MRA, which did not demonstrate any  evidence for renal artery stenosis, but had not gone through a complete  endocrine workup.  The patient was gradually started on clonidine, Coreg,   valsartan, and hydralazine, and had tremendous decrease to his blood  pressure.  Over the course of the next few days, his wife brought in  medications, which she states she was giving the patient and was noted to  have a difference in terms of medications we had listed versus what they had  at home.   In short, we did not feel the patient needed an angiotension receptor  blocker and an ACE-inhibitor, and favored the less expensive of the two.  Throughout this hospitalization, the patient was eventually titrated, such  that good blood pressure was obtained with clonidine and lisinopril and  metoprolol.  The patient's headache was resolved after discontinuing  hydralazine.  During this admission, repeat echocardiogram was obtained to  evaluate for any wall motion abnormality.  When this was not noted a  curbside consult was made to [place] [facility] to evaluate the patient's last stress  test, which was performed as an outpatient.   In short, the patient had a stress test performed on June 22, 2005,  which demonstrated normal perfusion and an LV function of 56%, which was  negative for ischemia.  Followup cardiology appointment on July 13, 2005, demonstrated basic risk factor modification for the patient and  suspect for the patient's T wave inversions to his inferior leads in the  inferolateral leads, which was on a resting EKG, to be likely repolarization  due to LVH and hypertension.  They did confirm that he did have diastolic  dysfunction at this time.  At no time did the patient have any ST-T wave  changes during this admission, nor did he complain of any chest pain.  Eventually the patient was discharged in stable condition with followup  appointments in the outpatient clinic with no need for repeat stress testing  or catheterization at this time.  Problem #2:  Obstructive sleep apnea.  The patient did not tolerate CPAP  settings at home.  He was retried on CPAP during this  admission and  thoroughly enjoyed the settings which were provided for him during this  admission.  As such, he as advised to continue at these settings as an  outpatient with followup in pulmonology and the [facility].   Problem #3:  Neuropathy.  The patient was continued on Neurontin during this  admission and adequately responded to this.   Problem #4:  Diabetes.  Hemoglobin A1c was 0.1% on this admission.  Eventually, he was taken off Starlix, continued on Lantus, and  advised to  continue taking metformin as an outpatient and will likely require the use  of sulfonylurea, but basically had excellent control of his blood sugar  after adequate titration of his Lantus.   DISCHARGE CONDITION:  Stable.   DISCHARGE LABORATORY STUDIES PERTINENT TO ADMISSION:  Hemoglobin A1c 9.1%,  creatinine 1.4, BUN 18 (baseline creatinine of 1.2 to 1.3 per patient),  hemoglobin 13.7, hematocrit 42.6.  Lipid profile, total cholesterol 159, LDL  102, triglycerides 137, HDL 30.  Protein creatinine ratio was 0.05 which  essentially was unremarkable.      Coralie Carpen, M.D.  Electronically Signed      Madaline Guthrie, M.D.  Electronically Signed    FR/MEDQ  D:  04/24/2006  T:  04/24/2006  Job:  [id number]

## 2011-03-02 NOTE — Procedures (Signed)
NAMEPHILLP, [name]             ACCOUNT NO.:  [id number]   MEDICAL RECORD NO.:  [id number]          PATIENT TYPE:  OUT   LOCATION:  SLEEP CENTER                 FACILITY:  [facility]   PHYSICIAN:  Coralyn Helling, M.D.      DATE OF BIRTH:  Dec 12, 1940   DATE OF STUDY:  08/17/2005                              NOCTURNAL POLYSOMNOGRAM   INDICATION FOR THE STUDY:  This is an individual who has excessive daytime  sleepiness and he has undergone an overnight polysomnogram July 29, 2005,  which showed evidence for a severe obstructive sleep apnea with an  apnea/hypopnea index of 86.4 and oxygen saturation nadir of 66%.   EPWORTH SLEEPINESS SCORE:  13   MEDICATIONS:  Aspirin, Starlix, clonidine, metformin, Coreg, Lantus,  Benicar, and Norvasc.   SLEEP ARCHITECTURE:  Total recording time was 441.5 minutes. Total sleep  time was 281.5 minutes for a sleep efficiency of 64%. The patient was  observed in REM sleep but no slow wave sleep was observed. Sleep latency was  40 minutes, which was prolonged. REM latency was 82 minutes, which was  within normal limits. The patient was observed in both the supine and  nonsupine position.   RESPIRATORY DATA:  The patient was titrated from CPAP pressure setting of 5  to 15 cmH2O. At lower pressure settings he had persistent episodes of apneas  which were obstructive in nature. However, at higher pressure settings, he  had both persistence of both apneas which were both central and obstructive  in nature, and significant sleep disruption as evidenced by increase in  arousals. Additionally, he had persistence of his hypoxemia.   CARDIAC DATA:  He had normal sinus rhythm.   MOVEMENT/PARASOMNIA:  The periodic limb movement index was 0.6.   IMPRESSIONS/RECOMMENDATIONS:  This is a suboptimal CPAP titration study.  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  I  would recommend starting the patient on CPAP of 13 cmH2O and monitoring his  clinical response, and then based on analysis of this, he could be  recommended for auto CPAP titration or in-lab repeat CPAP titration as well  as doing overnight oximetry. Additionally, if it is determined that he  should require higher pressure settings, then BiPAP titration may need to be  considered as well.     Coralyn Helling, M.D.  Diplomat, Biomedical engineer of Sleep Medicine  Electronically Signed    VS/MEDQ  D:  08/28/2005 15:59:32  T:  08/29/2005 08:54:08  Job:  [id number]

## 2011-03-02 NOTE — Procedures (Signed)
[name], [name]             ACCOUNT NO.:  [id number]   MEDICAL RECORD NO.:  [id number]          PATIENT TYPE:  OUT   LOCATION:  SLEEP CENTER                 FACILITY:  [facility]   PHYSICIAN:  Coralyn Helling, M.D.      DATE OF BIRTH:  December 20, 1940   DATE OF STUDY:  07/29/2005                              NOCTURNAL POLYSOMNOGRAM   PROCEDURE:  Nocturnal polysomnogram.   OPERATOR:  Coralyn Helling, M.D.   INDICATIONS FOR PROCEDURE:  This is an overnight polysomnogram conducted on  a patient who has daytime fatigue, snoring and witnessed apnea.  His Epworth  score is 18/24.   MEDICATIONS:  1.  Starlix.  2.  Aspirin.  3.  Clonidine.  4.  Metformin.  5.  Lasix.  6.  Coreg.  7.  Lantus insulin.  8.  Crestor.  9.  Benicar.  10. Norvasc.   SLEEP ARCHITECTURE:  Total test time was 415 minutes.  Total sleep time was  203 minutes.  Sleep efficiency was 49%.  The patient was observed in all  stages of sleep.  Sleep onset was 109.5 minutes, which was prolonged.  REM  latency was 92 minutes after sleep onset.  The patient was observed in  predominantly the non-supine position.   RESPIRATORY DATA:  The apnea/hypopnea index was 86.8.  The majority of these  events were obstructive in nature.  The apnea/hypopnea index in REM was  102.9; however, there was a reduced amount of REM and therefore this value  may be artificially elevated.   OXYGEN DATA:  The patient spent 67.4 minutes of test time with an oxygen  saturation between 91%and 100%, 333.5 minutes, with an oxygen saturation  between 81% and 90%, and 13.2 minutes with an oxygen saturation between 71%  and 80%.  The oxygen saturation Nadir was 66%.  The mean oxygen saturation  during sleep was 85%.   CARDIAC DATA:  The patient had a normal sinus rhythm with sinus bradycardia.  His heart rate ranged from 44 to 100 beats per minute while awake and 50 to  92 beats per minute in non-REM sleep and 58 to 94 beats per minute in REM  sleep.   Movement parasomnia.  The periocular movement index was 2.7 and the  periocular movement  index with arousal was 0.3.   IMPRESSION:  This study demonstrates severe obstructive sleep apnea as shown  by an apnea/hypopnea index of 86.4 with an oxygen saturation Nadir of 66%.  The patient did not meet split night protocol, due to the prolonged sleep  onset.   RECOMMENDATIONS:  The patient should be counseled in regards to diet,  exercise and weight reduction.  Additionally, he should be counseled with  regards to avoiding alcohol and sedatives and driving precautions should be  discussed with the patient.  He additionally should be referred back to the  sleep lab for a CPAP titration study.      Coralyn Helling, M.D.  Diplomat, Biomedical engineer of Sleep Medicine  Electronically Signed     VS/MEDQ  D:  07/31/2005 11:46:46  T:  07/31/2005 13:12:04  Job:  [id number]

## 2011-03-06 ENCOUNTER — Other Ambulatory Visit (INDEPENDENT_AMBULATORY_CARE_PROVIDER_SITE_OTHER): Payer: Medicare Other | Admitting: Internal Medicine

## 2011-03-06 DIAGNOSIS — I10 Essential (primary) hypertension: Secondary | ICD-10-CM

## 2011-03-07 NOTE — Telephone Encounter (Signed)
I cannot see that he is on lisinopril.

## 2011-03-08 NOTE — Telephone Encounter (Signed)
I will send Dr Sherryll Burger a message.

## 2011-03-08 NOTE — Telephone Encounter (Signed)
His med list in EPIC seems to be incomplete.  On the other hand, his problem list has every problem known to medical science.  Please ask Dr. Sherryll Burger to update both lists.  Patient may need labs.

## 2011-03-16 ENCOUNTER — Encounter: Payer: Self-pay | Admitting: Internal Medicine

## 2011-03-16 ENCOUNTER — Ambulatory Visit (INDEPENDENT_AMBULATORY_CARE_PROVIDER_SITE_OTHER): Payer: Medicare Other | Admitting: Internal Medicine

## 2011-03-16 DIAGNOSIS — K5900 Constipation, unspecified: Secondary | ICD-10-CM

## 2011-03-16 DIAGNOSIS — R609 Edema, unspecified: Secondary | ICD-10-CM

## 2011-03-16 DIAGNOSIS — E785 Hyperlipidemia, unspecified: Secondary | ICD-10-CM

## 2011-03-16 DIAGNOSIS — E119 Type 2 diabetes mellitus without complications: Secondary | ICD-10-CM

## 2011-03-16 DIAGNOSIS — I6350 Cerebral infarction due to unspecified occlusion or stenosis of unspecified cerebral artery: Secondary | ICD-10-CM

## 2011-03-16 DIAGNOSIS — I10 Essential (primary) hypertension: Secondary | ICD-10-CM

## 2011-03-16 LAB — POCT GLYCOSYLATED HEMOGLOBIN (HGB A1C): Hemoglobin A1C: 6.7

## 2011-03-16 LAB — GLUCOSE, CAPILLARY: Glucose-Capillary: 93 mg/dL (ref 70–99)

## 2011-03-16 NOTE — Patient Instructions (Signed)
Return in 2 weeks. Reduce the dose of insulin to 45 units for now. Call us if any sugars <70. Have regular, small amount frequent meals through out the day.  Measure your sugars early before breakfast, at lunch and dinner.

## 2011-03-17 LAB — BASIC METABOLIC PANEL WITH GFR
Chloride: 103 mEq/L (ref 96–112)
Creat: 1.35 mg/dL (ref 0.50–1.35)
GFR, Est African American: >60 mL/min (ref >60)
GFR, Est Non African American: 53 mL/min — ABNORMAL LOW (ref >60)
Potassium: 4.2 mEq/L (ref 3.5–5.3)

## 2011-03-20 MED ORDER — CLOPIDOGREL BISULFATE 75 MG PO TABS: 75.0000 mg | ORAL_TABLET | Freq: Every day | ORAL | Status: DC

## 2011-03-20 NOTE — Assessment & Plan Note (Signed)
LDL at target, continue zocor

## 2011-03-20 NOTE — Assessment & Plan Note (Signed)
Decreased his lantus insulin to 45 units from 60. His sliding scale has been discontinued in past, due to hypoglycemia. I am concerned about worsening of renal function, leading to longer/sustained insulin effect. I will recheck BMET. Asked them to keep log and call if any hypoglycemia is evident. We talked at length about how to control diet and avoid hypoglycemia.

## 2011-03-20 NOTE — Progress Notes (Signed)
  Subjective:    Patient ID: [name], male    DOB: June 19, 1941, 70 y.o.   MRN: [id number]  HPI 70 years old male with PMH of DM, HTN, hyperaldosteronism, CVA, presents for follow up. Patient is here with his wife as usual. Wife reports that he was found down in April at home on Sunday when she returned from [place]. The paramedics were called and her CBG was only 30. Patient recalls not eating lunch that day. They have brought glucometer with them which records only last seven days and we are unable to download full statement. Also the time and date were not set, so it is hard to interpret. But again there are episodes of CBG <70 in last week.   Patient reports feeling fine. His diet intake is not regular, and his schedule is erratic. He is retired and stays home but often changes his schedule and skips meals while other times eats bags of potato chips and fried chicken according to wife.    Review of Systems  Constitutional: Negative for fever, chills, activity change and appetite change.  HENT: Negative for nosebleeds, facial swelling, neck pain and tinnitus.   Eyes: Negative for pain, discharge and visual disturbance.  Respiratory: Negative for cough, chest tightness and shortness of breath.   Cardiovascular: Negative for chest pain and palpitations.  Gastrointestinal: Negative for nausea, vomiting, abdominal pain, blood in stool and abdominal distention.  Skin: Negative for rash.  Neurological: Negative for dizziness, seizures, weakness and headaches.  Psychiatric/Behavioral: Negative for suicidal ideas, confusion and agitation.       Objective:   Physical Exam  Constitutional: He is oriented to person, place, and time. He appears well-developed and well-nourished.  HENT:  Head: Normocephalic and atraumatic.  Right Ear: External ear normal.  Left Ear: External ear normal.  Eyes: Conjunctivae and EOM are normal. Pupils are equal, round, and reactive to light. Right eye exhibits  no discharge. Left eye exhibits no discharge.  Neck: Normal range of motion. Neck supple. No thyromegaly present.  Cardiovascular: Normal rate and regular rhythm.   No murmur heard. Pulmonary/Chest: Effort normal and breath sounds normal. No respiratory distress. He has no wheezes. He has no rales.  Abdominal: Soft. Bowel sounds are normal. He exhibits no distension and no mass. There is no tenderness. There is no rebound and no guarding.  Musculoskeletal: Normal range of motion.       Right knee: He exhibits swelling.       Left knee: He exhibits swelling and effusion.       Lumbar back: He exhibits tenderness.       Uses caine to mobilize  Neurological: He is alert and oriented to person, place, and time. He has normal reflexes. No cranial nerve deficit. Coordination normal.  Skin: No rash noted. He is not diaphoretic. No erythema.  Psychiatric: He has a normal mood and affect. His behavior is normal. Judgment and thought content normal.          Assessment & Plan:

## 2011-03-20 NOTE — Assessment & Plan Note (Addendum)
No evidence of new event. I will continue to monitor. He is on plavix

## 2011-03-20 NOTE — Assessment & Plan Note (Signed)
Stable. No change in medicines.

## 2011-03-20 NOTE — Assessment & Plan Note (Signed)
BP elevated, mainly systolic, he is already on coreg, Norvasc, lasix, lisinopril and terazosin. I will tolerate this for now and not add more medications.

## 2011-03-27 ENCOUNTER — Other Ambulatory Visit: Payer: Self-pay | Admitting: Internal Medicine

## 2011-04-02 ENCOUNTER — Ambulatory Visit (INDEPENDENT_AMBULATORY_CARE_PROVIDER_SITE_OTHER): Payer: Medicare Other | Admitting: Internal Medicine

## 2011-04-02 ENCOUNTER — Other Ambulatory Visit: Payer: Self-pay | Admitting: Internal Medicine

## 2011-04-02 ENCOUNTER — Encounter: Payer: Self-pay | Admitting: Internal Medicine

## 2011-04-02 DIAGNOSIS — I6350 Cerebral infarction due to unspecified occlusion or stenosis of unspecified cerebral artery: Secondary | ICD-10-CM

## 2011-04-02 DIAGNOSIS — R609 Edema, unspecified: Secondary | ICD-10-CM

## 2011-04-02 DIAGNOSIS — K5900 Constipation, unspecified: Secondary | ICD-10-CM

## 2011-04-02 DIAGNOSIS — E119 Type 2 diabetes mellitus without complications: Secondary | ICD-10-CM

## 2011-04-02 DIAGNOSIS — I10 Essential (primary) hypertension: Secondary | ICD-10-CM

## 2011-04-02 DIAGNOSIS — G5601 Carpal tunnel syndrome, right upper limb: Secondary | ICD-10-CM | POA: Insufficient documentation

## 2011-04-02 DIAGNOSIS — G5600 Carpal tunnel syndrome, unspecified upper limb: Secondary | ICD-10-CM

## 2011-04-02 DIAGNOSIS — E785 Hyperlipidemia, unspecified: Secondary | ICD-10-CM

## 2011-04-02 MED ORDER — POLYETHYLENE GLYCOL 3350 17 G PO PACK: 17.0000 g | PACK | Freq: Every day | ORAL | Status: DC

## 2011-04-02 MED ORDER — LISINOPRIL 20 MG PO TABS: 20.0000 mg | ORAL_TABLET | Freq: Every day | ORAL | Status: DC

## 2011-04-02 MED ORDER — SENNOSIDES 8.6 MG PO TABS: 1.0000 | ORAL_TABLET | Freq: Every day | ORAL | Status: DC

## 2011-04-02 MED ORDER — CLOPIDOGREL BISULFATE 75 MG PO TABS: 75.0000 mg | ORAL_TABLET | Freq: Every day | ORAL | Status: DC

## 2011-04-02 MED ORDER — TERAZOSIN HCL 5 MG PO CAPS: 5.0000 mg | ORAL_CAPSULE | Freq: Every day | ORAL | Status: DC

## 2011-04-02 MED ORDER — CARVEDILOL 25 MG PO TABS: 25.0000 mg | ORAL_TABLET | Freq: Two times a day (BID) | ORAL | Status: DC

## 2011-04-02 MED ORDER — FUROSEMIDE 80 MG PO TABS: 80.0000 mg | ORAL_TABLET | Freq: Every day | ORAL | Status: DC

## 2011-04-02 MED ORDER — SIMVASTATIN 40 MG PO TABS: 40.0000 mg | ORAL_TABLET | Freq: Every day | ORAL | Status: DC

## 2011-04-02 MED ORDER — GABAPENTIN 600 MG PO TABS: 600.0000 mg | ORAL_TABLET | Freq: Three times a day (TID) | ORAL | Status: DC

## 2011-04-02 MED ORDER — AMLODIPINE BESYLATE 10 MG PO TABS: 10.0000 mg | ORAL_TABLET | Freq: Every day | ORAL | Status: DC

## 2011-04-02 MED ORDER — EPLERENONE 50 MG PO TABS: 50.0000 mg | ORAL_TABLET | Freq: Two times a day (BID) | ORAL | Status: DC

## 2011-04-02 MED ORDER — INSULIN GLARGINE 100 UNIT/ML ~~LOC~~ SOLN: 60.0000 [IU] | Freq: Every day | SUBCUTANEOUS | Status: DC

## 2011-04-02 NOTE — Progress Notes (Signed)
  Subjective:    Patient ID: [name], male    DOB: 1941-02-08, 70 y.o.   MRN: [id number]  Diabetes Pertinent negatives for hypoglycemia include no confusion, dizziness, headaches or seizures. Pertinent negatives for diabetes include no chest pain and no weakness.   71 years old male with PMH of DM, HTN, hyperaldosteronism, CVA, presents for follow up. Patient is here with his wife as usual. Patient was last seen in clinic with episodes of hypoglycemia and loss of consciousness. I had decreased his lantus dose by 15 units. He has done well on new dose and no episodes of CBG <70 in last week. He does have increasing swelling of the legs.   He still skips meals while other times eats bags of potato chips and fried chicken according to wife.    Review of Systems  Constitutional: Negative for fever, chills, activity change and appetite change.  HENT: Negative for nosebleeds, facial swelling, neck pain and tinnitus.   Eyes: Negative for pain, discharge and visual disturbance.  Respiratory: Negative for cough, chest tightness and shortness of breath.   Cardiovascular: Negative for chest pain and palpitations.  Gastrointestinal: Negative for nausea, vomiting, abdominal pain, blood in stool and abdominal distention.  Skin: Negative for rash.  Neurological: Negative for dizziness, seizures, weakness and headaches.  Psychiatric/Behavioral: Negative for suicidal ideas, confusion and agitation.       Objective:   Physical Exam  Constitutional: He is oriented to person, place, and time. He appears well-developed and well-nourished.  HENT:  Head: Normocephalic and atraumatic.  Right Ear: External ear normal.  Left Ear: External ear normal.  Eyes: Conjunctivae and EOM are normal. Pupils are equal, round, and reactive to light. Right eye exhibits no discharge. Left eye exhibits no discharge.  Neck: Normal range of motion. Neck supple. No thyromegaly present.  Cardiovascular: Normal rate and  regular rhythm.   No murmur heard. Pulmonary/Chest: Effort normal and breath sounds normal. No respiratory distress. He has no wheezes. He has no rales.  Abdominal: Soft. Bowel sounds are normal. He exhibits no distension and no mass. There is no tenderness. There is no rebound and no guarding.  Musculoskeletal: Normal range of motion.       Right knee: He exhibits swelling.       Left knee: He exhibits swelling and effusion.       Lumbar back: He exhibits tenderness.       Uses caine to mobilize  Neurological: He is alert and oriented to person, place, and time. He has normal reflexes. No cranial nerve deficit. Coordination normal.  Skin: No rash noted. He is not diaphoretic. No erythema.  Psychiatric: He has a normal mood and affect. His behavior is normal. Judgment and thought content normal.          Assessment & Plan:

## 2011-04-02 NOTE — Assessment & Plan Note (Signed)
Once again emphasized the need of elevating his leg while watching TV and staying at home. He does not appear to have any ulcers in his late right now. In past he has required wrapping at times. No further therapy needed for now. But may have to go up on lasix if his swelling persists.

## 2011-04-02 NOTE — Progress Notes (Signed)
Podiatry appt scheduled at [facility] [phone] on [id number] for 04/11/11 at 9am.  Pt given appt card in hand.

## 2011-04-02 NOTE — Assessment & Plan Note (Signed)
Report symptoms of pain, tingling and numbness in right medial 2 fingers and a half of the third finger. No thenar or dystrophy. Likely secondary to volume overload. Conservative management as of right now.

## 2011-04-02 NOTE — Assessment & Plan Note (Signed)
His blood pressure continues to be well controlled no changes in her medications.

## 2011-04-02 NOTE — Patient Instructions (Signed)
Return in one month. Continue to monitor your sugars No changes in your medications.

## 2011-04-02 NOTE — Assessment & Plan Note (Signed)
He presents his sugar ranging from 90-200. Given that he recent hypoglycemic event, i will not change his medication for right now.

## 2011-05-01 DIAGNOSIS — Z1211 Encounter for screening for malignant neoplasm of colon: Secondary | ICD-10-CM

## 2011-05-03 ENCOUNTER — Other Ambulatory Visit (INDEPENDENT_AMBULATORY_CARE_PROVIDER_SITE_OTHER): Payer: Medicare Other

## 2011-05-03 DIAGNOSIS — Z1211 Encounter for screening for malignant neoplasm of colon: Secondary | ICD-10-CM

## 2011-05-03 LAB — HEMOCCULT GUIAC POC 1CARD (OFFICE)
Card #2 Fecal Occult Blod, POC: NEGATIVE
Card #3 Fecal Occult Blood, POC: NEGATIVE
Fecal Occult Blood, POC: NEGATIVE

## 2011-06-20 ENCOUNTER — Encounter: Payer: Self-pay | Admitting: Internal Medicine

## 2011-06-20 ENCOUNTER — Ambulatory Visit (INDEPENDENT_AMBULATORY_CARE_PROVIDER_SITE_OTHER): Payer: Medicare Other | Admitting: Internal Medicine

## 2011-06-20 DIAGNOSIS — Z23 Encounter for immunization: Secondary | ICD-10-CM

## 2011-06-20 DIAGNOSIS — G5601 Carpal tunnel syndrome, right upper limb: Secondary | ICD-10-CM

## 2011-06-20 DIAGNOSIS — R609 Edema, unspecified: Secondary | ICD-10-CM

## 2011-06-20 DIAGNOSIS — I10 Essential (primary) hypertension: Secondary | ICD-10-CM

## 2011-06-20 DIAGNOSIS — K5900 Constipation, unspecified: Secondary | ICD-10-CM

## 2011-06-20 DIAGNOSIS — G5600 Carpal tunnel syndrome, unspecified upper limb: Secondary | ICD-10-CM

## 2011-06-20 DIAGNOSIS — Z0000 Encounter for general adult medical examination without abnormal findings: Secondary | ICD-10-CM | POA: Insufficient documentation

## 2011-06-20 DIAGNOSIS — E119 Type 2 diabetes mellitus without complications: Secondary | ICD-10-CM

## 2011-06-20 MED ORDER — GABAPENTIN 600 MG PO TABS: 600.0000 mg | ORAL_TABLET | Freq: Three times a day (TID) | ORAL | Status: DC

## 2011-06-20 MED ORDER — POLYETHYLENE GLYCOL 3350 17 G PO PACK: 17.0000 g | PACK | Freq: Every day | ORAL | Status: DC

## 2011-06-20 MED ORDER — INSULIN GLARGINE 100 UNIT/ML ~~LOC~~ SOLN: 45.0000 [IU] | Freq: Every day | SUBCUTANEOUS | Status: DC

## 2011-06-20 MED ORDER — FUROSEMIDE 40 MG PO TABS: ORAL_TABLET | ORAL | Status: DC

## 2011-06-20 MED ORDER — SENNOSIDES 8.6 MG PO TABS: 1.0000 | ORAL_TABLET | Freq: Every day | ORAL | Status: DC

## 2011-06-20 MED ORDER — ZOSTER VACCINE LIVE 19400 UNT/0.65ML ~~LOC~~ SOLR: 0.6500 mL | Freq: Once | SUBCUTANEOUS | Status: DC

## 2011-06-20 NOTE — Patient Instructions (Signed)
Your blood pressure was high today, probably because you did not take your blood pressure pills this morning, and maybe because your wife hasn't been available to help organize your blood pressure medications for you -please have your daughter or another family member help you organize your medications -it is important to take your blood pressure medications daily -we are increasing your Lasix to 80 mg in the morning, 40 mg at night, which will also help with your leg swelling  Today you received a flu shot, your tetanus vaccine, and a prescription for the shingles vaccine  Please return for a follow-up visit in 1-2 weeks, so we can evaluate your leg swelling, and re-check your blood pressure.

## 2011-06-20 NOTE — Assessment & Plan Note (Signed)
Patient notes that his LE edema is at baseline, though it is 2+ pitting bilaterally.  Patient reports some success with compression stockings in the past, and believes he still has a pair at home -patient encouraged to wear compression stockings, and given a prescription for a new pair if he cannot find his old pair -patient encouraged to elevate his legs when sitting at home during the day -lasix increased from 80 qday to 80 qam plus 40 qpm, may continue to increase as needed for symptomatic relief, though will proceed cautiously given baseline Cr around 1.4

## 2011-06-20 NOTE — Assessment & Plan Note (Signed)
Hb A1c = 8.6 today, unchanged from prior, with no further episodes of hypoglycemia -continue Lantus 45 U qday

## 2011-06-20 NOTE — Assessment & Plan Note (Signed)
-  flu shot administered -patient given prescription for zostavax -tdap vaccine administered -patient had FOBT negative in 04/2011, declines colonoscopy at this time.

## 2011-06-20 NOTE — Assessment & Plan Note (Signed)
Previously well-managed with a wrist splint, which patient believes he still has a home.  Symptoms have started to recur for the past few weeks -patient encouraged to wear a wrist splint, particularly at night.  Patient given a prescription for a new wrist splint, and informed to ensure that the splint had a hard support device incorporated into it

## 2011-06-20 NOTE — Assessment & Plan Note (Signed)
BP elevated today, but likely due to not taking BP pills this morning, and not properly taking his medications for the last few weeks while his wife has been sick.  His wife is now back at the house, and will resume managing his medications for him -in the setting of elevated BP and LE edema, lasix was increased from 80 qday to 80 qam, 40 qpm -patient to return to clinic in about 1 week for BP re-check, and to re-check bmet, given lasix increase in the setting of CRI

## 2011-06-20 NOTE — Progress Notes (Signed)
HPI The patient is a 70 yo man with a history of DM, HL, depression, OSA, HTN, prior CVA, and BPH, presenting for a follow-up appointment.  The patient's BP was 180/70 today.  The patient notes that he has not taken his BP medications this morning.  He also notes that his wife, who usually helps him manage his daily medications, has been sick for the past few weeks, and his daughter believes he hasn't been taking all of his medications as prescribed during this time period.  However, the patient's wife just returned from the hospital, and will henceforth be able to help him manage his medications again.  The patient has a history of chronic LE edema, which the patient notes is at its baseline today.  He notes increased swelling when he is on his feet during the day, and a resolution in swelling when elevated at night.  The patient's weight is relatively unchanged from his prior visit.  The patient has a history of diabetes, with Hb A1C today of 6.7, which is unchanged from prior.  He Lantus dose was decreased at a previous appointment to 45 units daily, due to episodes of hypoglycemia, but the patient notes no such episodes over the last few months on his new lantus dose.  His average CBG's at home are reportedly around 150, with a range from 90-250.  The patient has a history of carpal tunnel syndrome, previously well-treated with a wrist splint, with a recurrence of right hand "tingling" over the last few weeks.  He notes no loss of sensation or motor function in his right hand.  ROS: General: no fevers, chills, changes in weight, changes in appetite Skin: no rash HEENT: no blurry vision, hearing changes, sore throat Pulm: no dyspnea, coughing, wheezing CV: no chest pain, palpitations, shortness of breath Abd: no abdominal pain, nausea/vomiting, diarrhea/constipation GU: no dysuria, hematuria, polyuria Ext: no arthralgias, myalgias Neuro: no weakness, numbness, or tingling  Filed Vitals:   06/20/11 1347  BP: 180/70  Pulse: 59  Temp: 98.8 F (37.1 C)    PEX General: alert, cooperative, and in no apparent distress HEENT: pupils equal round and reactive to light, vision grossly intact, oropharynx clear and non-erythematous  Neck: supple, no lymphadenopathy, no JVD Lungs: clear to ascultation bilaterally, normal work of respiration, no wheezes, rales, ronchi Heart: regular rate and rhythm, no murmurs, gallops, or rubs Abdomen: soft, non-tender, non-distended, normal bowel sounds Extremities: 2+ pitting edema in bilateral lower extremities, 2+ DP/PT pulses bilaterally Neurologic: alert & oriented X3, cranial nerves II-XII intact, strength 5/5 throughout, sensation intact to light touch  Assessment/Plan

## 2011-06-21 NOTE — Progress Notes (Signed)
I agree with Dr. Brown's assessment and plan. 

## 2011-06-28 ENCOUNTER — Encounter: Payer: Self-pay | Admitting: Internal Medicine

## 2011-06-28 ENCOUNTER — Other Ambulatory Visit: Payer: Self-pay | Admitting: Internal Medicine

## 2011-06-28 ENCOUNTER — Ambulatory Visit (INDEPENDENT_AMBULATORY_CARE_PROVIDER_SITE_OTHER): Payer: Medicare Other | Admitting: Internal Medicine

## 2011-06-28 VITALS — BP 162/66 | HR 58 | Temp 97.3°F | Ht 67.0 in | Wt 227.3 lb

## 2011-06-28 DIAGNOSIS — E119 Type 2 diabetes mellitus without complications: Secondary | ICD-10-CM

## 2011-06-28 DIAGNOSIS — I10 Essential (primary) hypertension: Secondary | ICD-10-CM

## 2011-06-28 LAB — GLUCOSE, CAPILLARY

## 2011-06-28 NOTE — Assessment & Plan Note (Signed)
The patient returns today for a blood pressure check, after an elevated blood pressure at his last visit, likely due to medication non-compliance since his wife (who usually manages his medications) was sick.  His BP is lower today, despite not taking his medications this morning, and he has less LE edema, with a weight loss of several pounds. -we will continue the increased dose of lasix for another 2 weeks, then see the patient back for another BP check visit.  At that time, we will likely revert back to his prior lasix dose. -a bmet was not obtained today, since the results would likely be inaccurate given his poor PO intake today.

## 2011-06-28 NOTE — Assessment & Plan Note (Signed)
Patient's blood sugar was low today, after fasting all day before his 1:30 pm appointment.  The patient was given orange juice and crackers, and his CBG appropriately rose.  The patient was told to come to his next appointment not fasting, and having taken all of his daily medications.

## 2011-06-28 NOTE — Progress Notes (Signed)
Blood Pressure Check  HPI The patient is a 70 yo man, history of DM, HL, depression, OSA, HTN, prior CVA, presenting for a 1-week follow-up blood pressure check.  The patient's BP was elevated at his last visit to 180/70, and he noted LE edema.  The patient attributed this to his wife being sick for the past few weeks, and not present around the house to help him manage his medications, as is their usual routine.  The patient's wife is with him today, and states that she is now back in the house, and has him back on his regularly scheduled medications.  The patient's lasix was increased last week from 80 qday to 80 qam and 40 qpm.  He thought he had to fast for today's afternoon appointment, and has not had anything to eat all day, and has also not taken any of his medications today.  Despite this, his BP is somewhat improved from prior, and his weight has decreased, with subjectively less LE edema than he had at his last visit.  ROS:  General: no fevers, chills, changes in weight, changes in appetite  Skin: no rash  HEENT: no blurry vision, hearing changes, sore throat  Pulm: no dyspnea, coughing, wheezing  CV: no chest pain, palpitations, shortness of breath  Abd: no abdominal pain, nausea/vomiting, diarrhea/constipation  GU: no dysuria, hematuria, polyuria  Ext: no arthralgias, myalgias  Neuro: no weakness, numbness, or tingling   Filed Vitals:   06/28/11 1333  BP: 162/66  Pulse: 58  Temp: 97.3 F (36.3 C)     PEX  General: alert, cooperative, and in no apparent distress HEENT: pupils equal round and reactive to light, vision grossly intact, oropharynx clear and non-erythematous  Neck: supple, no lymphadenopathy, no JVD Lungs: clear to ascultation bilaterally, normal work of respiration, no wheezes, rales, ronchi Heart: regular rate and rhythm, no murmurs, gallops, or rubs Abdomen: soft, non-tender, non-distended, normal bowel sounds  Extremities: 2+ pitting edema in bilateral  lower extremities, though mildly decreased from last week, 2+ DP/PT pulses bilaterally Neurologic: alert & oriented X3, cranial nerves II-XII intact, strength 5/5 throughout, sensation intact to light touch   Assessment/Plan

## 2011-06-28 NOTE — Patient Instructions (Addendum)
Your blood pressure is still elevated today, but this is likely because you did not take your blood pressure medications this morning -we are checking some blood work for your kidneys today, since we adjusted your lasix dose at your last visit  Please return for another blood pressure check in 2 weeks, at which point we will re-adjust your Lasix medication.

## 2011-06-28 NOTE — Progress Notes (Signed)
CBG was 42 when pt arrived;c/o dizziness per wife. Wife states pt has been fasting since last night. Glucose 15 oral gel given along w/OJ. MD awared. Re-checked CBG- up to 91; graham crackers given.

## 2011-06-28 NOTE — Progress Notes (Signed)
I agree with Dr. Brown's assessment and plan. 

## 2011-07-12 LAB — BASIC METABOLIC PANEL
BUN: 17
BUN: 28 — ABNORMAL HIGH
CO2: 32
CO2: 31
Calcium: 9
Calcium: 9.1
Calcium: 9.6
Chloride: 109
Creatinine, Ser: 1.26
Creatinine, Ser: 1.49
GFR calc Af Amer: >60
GFR calc Af Amer: >60
GFR calc Af Amer: 57 — ABNORMAL LOW
GFR calc non Af Amer: 51 — ABNORMAL LOW
GFR calc non Af Amer: 59 — ABNORMAL LOW
GFR calc non Af Amer: 47 — ABNORMAL LOW
Glucose, Bld: 48 — ABNORMAL LOW
Glucose, Bld: 59 — ABNORMAL LOW
Potassium: 3.9
Potassium: 4.1
Sodium: 142
Sodium: 142
Sodium: 147 — ABNORMAL HIGH

## 2011-07-12 LAB — POCT I-STAT, CHEM 8
Creatinine, Ser: 1.5
Glucose, Bld: 128 — ABNORMAL HIGH
HCT: 44
Hemoglobin: 15
TCO2: 27

## 2011-07-12 LAB — CBC
HCT: 40
HCT: 40.1
Hemoglobin: 12.9 — ABNORMAL LOW
Hemoglobin: 13
Hemoglobin: 13.1
Hemoglobin: 13.2
MCHC: 31.7
MCHC: 32.8
MCV: 82
MCV: 81.7
Platelets: 201
Platelets: 218
RBC: 5.01
RBC: 4.91
RDW: 15.9 — ABNORMAL HIGH
RDW: 16.3 — ABNORMAL HIGH
RDW: 16.2 — ABNORMAL HIGH
WBC: 7.6
WBC: 6.6

## 2011-07-12 LAB — COMPREHENSIVE METABOLIC PANEL
ALT: 25
Albumin: 3.4 — ABNORMAL LOW
Alkaline Phosphatase: 59
BUN: 15
GFR calc Af Amer: >60
Potassium: 3.9
Sodium: 139
Total Protein: 6.6

## 2011-07-12 LAB — CARDIAC PANEL(CRET KIN+CKTOT+MB+TROPI)
CK, MB: 2
Relative Index: 1.1
Total CK: 240 — ABNORMAL HIGH
Total CK: 253 — ABNORMAL HIGH
Troponin I: 0.01

## 2011-07-12 LAB — POCT CARDIAC MARKERS
CKMB, poc: <1 — ABNORMAL LOW
Myoglobin, poc: 88.8
Troponin i, poc: <0.05

## 2011-07-12 LAB — HEMOGLOBIN A1C
Hgb A1c MFr Bld: 8.1 — ABNORMAL HIGH
Mean Plasma Glucose: 211

## 2011-07-12 LAB — PROTIME-INR
INR: 1.1
Prothrombin Time: 14

## 2011-07-13 ENCOUNTER — Encounter: Payer: Medicare Other | Admitting: Internal Medicine

## 2011-07-13 LAB — BASIC METABOLIC PANEL
BUN: 23
BUN: 24 — ABNORMAL HIGH
CO2: 25
CO2: 33 — ABNORMAL HIGH
Calcium: 8.2 — ABNORMAL LOW
Calcium: 9.4
Chloride: 106
Chloride: 110
Creatinine, Ser: 1.32
Creatinine, Ser: 1.34
GFR calc Af Amer: >60
GFR calc Af Amer: >60
GFR calc non Af Amer: 54 — ABNORMAL LOW
GFR calc non Af Amer: 54 — ABNORMAL LOW
Glucose, Bld: 218 — ABNORMAL HIGH
Glucose, Bld: 252 — ABNORMAL HIGH
Potassium: 4.2
Potassium: 4.3
Sodium: 141
Sodium: 144

## 2011-07-13 LAB — GLUCOSE, CAPILLARY
Glucose-Capillary: 143 — ABNORMAL HIGH
Glucose-Capillary: 148 — ABNORMAL HIGH
Glucose-Capillary: 152 — ABNORMAL HIGH
Glucose-Capillary: 181 — ABNORMAL HIGH
Glucose-Capillary: 198 — ABNORMAL HIGH
Glucose-Capillary: 211 — ABNORMAL HIGH
Glucose-Capillary: 211 — ABNORMAL HIGH
Glucose-Capillary: 232 — ABNORMAL HIGH
Glucose-Capillary: 238 — ABNORMAL HIGH
Glucose-Capillary: 273 — ABNORMAL HIGH

## 2011-07-13 LAB — CBC
HCT: 33.4 — ABNORMAL LOW
HCT: 41.2
Hemoglobin: 10.8 — ABNORMAL LOW
Hemoglobin: 13
MCHC: 31.4
MCHC: 32.3
MCV: 82.9
MCV: 83.3
Platelets: 159
Platelets: 180
RBC: 4.03 — ABNORMAL LOW
RBC: 4.95
RDW: 16.5 — ABNORMAL HIGH
RDW: 16.6 — ABNORMAL HIGH
WBC: 16.5 — ABNORMAL HIGH
WBC: 7

## 2011-07-13 LAB — CROSSMATCH
ABO/RH(D): A POS
Antibody Screen: NEGATIVE

## 2011-07-13 LAB — POCT I-STAT GLUCOSE
Glucose, Bld: 126 — ABNORMAL HIGH
Glucose, Bld: 126 — ABNORMAL HIGH
Glucose, Bld: 152 — ABNORMAL HIGH
Glucose, Bld: 173 — ABNORMAL HIGH
Operator id: 190281
Operator id: 190281
Operator id: 190281
Operator id: 190281

## 2011-07-13 LAB — POCT I-STAT 4, (NA,K, GLUC, HGB,HCT)
Glucose, Bld: 134 — ABNORMAL HIGH
HCT: 35 — ABNORMAL LOW
Hemoglobin: 11.9 — ABNORMAL LOW
Potassium: 4.1
Sodium: 144

## 2011-07-13 LAB — ABO/RH: ABO/RH(D): A POS

## 2011-07-13 LAB — HEMOGLOBIN A1C
Hgb A1c MFr Bld: 9.3 — ABNORMAL HIGH
Mean Plasma Glucose: 254

## 2011-07-18 LAB — GLUCOSE, CAPILLARY
Glucose-Capillary: 147 — ABNORMAL HIGH
Glucose-Capillary: 171 — ABNORMAL HIGH
Glucose-Capillary: 175 — ABNORMAL HIGH
Glucose-Capillary: 179 — ABNORMAL HIGH
Glucose-Capillary: 180 — ABNORMAL HIGH
Glucose-Capillary: 197 — ABNORMAL HIGH
Glucose-Capillary: 202 — ABNORMAL HIGH
Glucose-Capillary: 203 — ABNORMAL HIGH
Glucose-Capillary: 209 — ABNORMAL HIGH
Glucose-Capillary: 211 — ABNORMAL HIGH
Glucose-Capillary: 211 — ABNORMAL HIGH
Glucose-Capillary: 221 — ABNORMAL HIGH
Glucose-Capillary: 234 — ABNORMAL HIGH
Glucose-Capillary: 249 — ABNORMAL HIGH
Glucose-Capillary: 276 — ABNORMAL HIGH

## 2011-07-18 LAB — BASIC METABOLIC PANEL
CO2: 26
Calcium: 8.8
Creatinine, Ser: 1.18
GFR calc Af Amer: >60
GFR calc non Af Amer: >60
Potassium: 4.6
Sodium: 139

## 2011-07-18 LAB — CBC
HCT: 32.3 — ABNORMAL LOW
Hemoglobin: 10 — ABNORMAL LOW
Hemoglobin: 10.4 — ABNORMAL LOW
Hemoglobin: 9.7 — ABNORMAL LOW
MCHC: 32
MCHC: 32.1
MCHC: 32.8
MCHC: 32.8
MCHC: 32.9
MCV: 81.1
MCV: 81.7
MCV: 83.7
Platelets: 212
Platelets: 341
Platelets: 346
RBC: 3.48 — ABNORMAL LOW
RBC: 3.72 — ABNORMAL LOW
RBC: 3.99 — ABNORMAL LOW
RBC: 4 — ABNORMAL LOW
RDW: 16.6 — ABNORMAL HIGH
RDW: 16.7 — ABNORMAL HIGH
WBC: 10.5
WBC: 11.1 — ABNORMAL HIGH
WBC: 15.7 — ABNORMAL HIGH
WBC: 17.2 — ABNORMAL HIGH

## 2011-07-18 LAB — COMPREHENSIVE METABOLIC PANEL
ALT: 18
ALT: 24
ALT: 25
AST: 12
AST: 17
Albumin: 2.9 — ABNORMAL LOW
BUN: 20
CO2: 25
CO2: 26
CO2: 29
Calcium: 8.8
Calcium: 8.9
Calcium: 8.9
Calcium: 9
Chloride: 106
Creatinine, Ser: 1.26
Creatinine, Ser: 1.4
Creatinine, Ser: 1.53 — ABNORMAL HIGH
GFR calc Af Amer: >60
GFR calc Af Amer: >60
GFR calc non Af Amer: 46 — ABNORMAL LOW
GFR calc non Af Amer: 51 — ABNORMAL LOW
GFR calc non Af Amer: 52 — ABNORMAL LOW
GFR calc non Af Amer: 57 — ABNORMAL LOW
Glucose, Bld: 137 — ABNORMAL HIGH
Glucose, Bld: 210 — ABNORMAL HIGH
Sodium: 137
Sodium: 139
Total Bilirubin: 0.8
Total Bilirubin: 1
Total Protein: 6.1

## 2011-07-18 LAB — LEGIONELLA ANTIGEN, URINE

## 2011-07-18 LAB — URINE CULTURE
Colony Count: NO GROWTH
Culture: NO GROWTH

## 2011-07-18 LAB — URINALYSIS, ROUTINE W REFLEX MICROSCOPIC
Bilirubin Urine: NEGATIVE
Nitrite: NEGATIVE
Protein, ur: 100 — AB
Specific Gravity, Urine: 1.022
Specific Gravity, Urine: 1.028
Urobilinogen, UA: 1
pH: 7

## 2011-07-18 LAB — CULTURE, BLOOD (ROUTINE X 2): Culture: NO GROWTH

## 2011-07-18 LAB — CSF CELL COUNT WITH DIFFERENTIAL
Eosinophils, CSF: 0
Lymphs, CSF: 90 — ABNORMAL HIGH
Monocyte-Macrophage-Spinal Fluid: 8 — ABNORMAL LOW
RBC Count, CSF: 13 — ABNORMAL HIGH
Segmented Neutrophils-CSF: 2
WBC, CSF: 14 — ABNORMAL HIGH

## 2011-07-18 LAB — TROPONIN I: Troponin I: 0.01

## 2011-07-18 LAB — DIFFERENTIAL
Basophils Absolute: 0.1
Basophils Absolute: 0.1
Eosinophils Relative: 0
Lymphocytes Relative: 10 — ABNORMAL LOW
Lymphocytes Relative: 9 — ABNORMAL LOW
Lymphs Abs: 1.5
Lymphs Abs: 1.5
Neutro Abs: 12.8 — ABNORMAL HIGH
Neutro Abs: 13.8 — ABNORMAL HIGH

## 2011-07-18 LAB — IRON AND TIBC: UIBC: 197

## 2011-07-18 LAB — CSF CULTURE W GRAM STAIN

## 2011-07-18 LAB — CARDIAC PANEL(CRET KIN+CKTOT+MB+TROPI)
CK, MB: 2.4
Relative Index: 1.5
Relative Index: 1.9
Total CK: 125
Troponin I: 0.03
Troponin I: 0.03

## 2011-07-18 LAB — PROTEIN AND GLUCOSE, CSF
Glucose, CSF: 94 — ABNORMAL HIGH
Total  Protein, CSF: 55 — ABNORMAL HIGH

## 2011-07-18 LAB — TSH: TSH: 1.085

## 2011-07-18 LAB — METHYLMALONIC ACID, SERUM: Methylmalonic Acid, Quantitative: 102 nmol/L (ref 87–318)

## 2011-07-18 LAB — POCT CARDIAC MARKERS: Troponin i, poc: <0.05

## 2011-07-18 LAB — URINE MICROSCOPIC-ADD ON

## 2011-07-18 LAB — CK TOTAL AND CKMB (NOT AT ARMC)
CK, MB: 1.2
Relative Index: 0.9
Total CK: 129

## 2011-07-18 LAB — FERRITIN: Ferritin: 113 (ref 22–322)

## 2011-07-18 LAB — HSV PCR: HSV 2 , PCR: NOT DETECTED

## 2011-07-18 LAB — RETICULOCYTES
RBC.: 4.15 — ABNORMAL LOW
Retic Ct Pct: 1.9

## 2011-07-18 LAB — FOLATE: Folate: 16

## 2011-07-26 LAB — BASIC METABOLIC PANEL
BUN: 19
BUN: 21
Calcium: 9.2
Calcium: 9.4
Calcium: 9.6
Chloride: 103
Creatinine, Ser: 1.22
Creatinine, Ser: 1.35
Creatinine, Ser: 1.39
GFR calc Af Amer: >60
GFR calc Af Amer: >60
GFR calc non Af Amer: 51 — ABNORMAL LOW
GFR calc non Af Amer: 53 — ABNORMAL LOW
GFR calc non Af Amer: 60 — ABNORMAL LOW
Glucose, Bld: 213 — ABNORMAL HIGH
Potassium: 4.6
Sodium: 142

## 2011-07-26 LAB — CARDIAC PANEL(CRET KIN+CKTOT+MB+TROPI)
CK, MB: 2.2
CK, MB: 2.4
Total CK: 159
Total CK: 171
Troponin I: 0.03

## 2011-07-26 LAB — DIFFERENTIAL
Basophils Relative: 1
Eosinophils Absolute: 0.1
Neutrophils Relative %: 50

## 2011-07-26 LAB — CBC
Hemoglobin: 14.1
Hemoglobin: 14.2
MCHC: 31.1
MCV: 80.8
Platelets: 221
RDW: 15.8 — ABNORMAL HIGH
RDW: 16.4 — ABNORMAL HIGH
WBC: 6

## 2011-07-26 LAB — LIPID PANEL
LDL Cholesterol: 129 — ABNORMAL HIGH
VLDL: 40

## 2011-07-26 LAB — HEMOGLOBIN A1C: Hgb A1c MFr Bld: 9.9 — ABNORMAL HIGH

## 2011-07-26 LAB — TROPONIN I: Troponin I: 0.03

## 2011-07-26 LAB — FOLATE RBC: RBC Folate: 823 — ABNORMAL HIGH

## 2011-07-26 LAB — CK TOTAL AND CKMB (NOT AT ARMC)
CK, MB: 2.4
Relative Index: 1.5
Total CK: 159

## 2011-07-26 LAB — RPR: RPR Ser Ql: NONREACTIVE

## 2011-10-05 ENCOUNTER — Encounter: Payer: Self-pay | Admitting: Internal Medicine

## 2011-12-24 ENCOUNTER — Other Ambulatory Visit: Payer: Self-pay | Admitting: *Deleted

## 2011-12-25 MED ORDER — TERAZOSIN HCL 5 MG PO CAPS: 5.0000 mg | ORAL_CAPSULE | Freq: Every day | ORAL | Status: DC

## 2011-12-25 MED ORDER — EPLERENONE 50 MG PO TABS: 50.0000 mg | ORAL_TABLET | Freq: Two times a day (BID) | ORAL | Status: DC

## 2011-12-28 ENCOUNTER — Ambulatory Visit: Payer: Medicare Other | Admitting: Physical Therapy

## 2012-01-04 ENCOUNTER — Ambulatory Visit: Payer: Medicare Other | Attending: Neurology | Admitting: Physical Therapy

## 2012-01-04 ENCOUNTER — Ambulatory Visit: Payer: Medicare Other | Admitting: Physical Therapy

## 2012-01-04 DIAGNOSIS — R279 Unspecified lack of coordination: Secondary | ICD-10-CM | POA: Insufficient documentation

## 2012-01-04 DIAGNOSIS — I69998 Other sequelae following unspecified cerebrovascular disease: Secondary | ICD-10-CM | POA: Insufficient documentation

## 2012-01-04 DIAGNOSIS — M25519 Pain in unspecified shoulder: Secondary | ICD-10-CM | POA: Insufficient documentation

## 2012-01-04 DIAGNOSIS — Z5189 Encounter for other specified aftercare: Secondary | ICD-10-CM | POA: Insufficient documentation

## 2012-01-04 DIAGNOSIS — M6281 Muscle weakness (generalized): Secondary | ICD-10-CM | POA: Insufficient documentation

## 2012-01-04 DIAGNOSIS — R269 Unspecified abnormalities of gait and mobility: Secondary | ICD-10-CM | POA: Insufficient documentation

## 2012-01-09 ENCOUNTER — Ambulatory Visit: Payer: Medicare Other | Admitting: Occupational Therapy

## 2012-01-09 ENCOUNTER — Ambulatory Visit: Payer: Medicare Other | Admitting: Physical Therapy

## 2012-01-14 ENCOUNTER — Ambulatory Visit: Payer: Medicare Other | Admitting: Physical Therapy

## 2012-01-14 ENCOUNTER — Ambulatory Visit: Payer: Medicare Other | Attending: Neurology | Admitting: Occupational Therapy

## 2012-01-14 DIAGNOSIS — R279 Unspecified lack of coordination: Secondary | ICD-10-CM | POA: Insufficient documentation

## 2012-01-14 DIAGNOSIS — I69998 Other sequelae following unspecified cerebrovascular disease: Secondary | ICD-10-CM | POA: Insufficient documentation

## 2012-01-14 DIAGNOSIS — R269 Unspecified abnormalities of gait and mobility: Secondary | ICD-10-CM | POA: Insufficient documentation

## 2012-01-14 DIAGNOSIS — M6281 Muscle weakness (generalized): Secondary | ICD-10-CM | POA: Insufficient documentation

## 2012-01-14 DIAGNOSIS — Z5189 Encounter for other specified aftercare: Secondary | ICD-10-CM | POA: Insufficient documentation

## 2012-01-14 DIAGNOSIS — M25519 Pain in unspecified shoulder: Secondary | ICD-10-CM | POA: Insufficient documentation

## 2012-01-16 ENCOUNTER — Ambulatory Visit: Payer: Medicare Other | Admitting: Physical Therapy

## 2012-01-16 ENCOUNTER — Encounter: Payer: Medicare Other | Admitting: Occupational Therapy

## 2012-01-23 ENCOUNTER — Encounter: Payer: Medicare Other | Admitting: Occupational Therapy

## 2012-01-23 ENCOUNTER — Ambulatory Visit: Payer: Medicare Other | Admitting: Physical Therapy

## 2012-01-25 ENCOUNTER — Ambulatory Visit: Payer: Medicare Other | Admitting: Occupational Therapy

## 2012-01-25 ENCOUNTER — Ambulatory Visit: Payer: Medicare Other | Admitting: Physical Therapy

## 2012-01-30 ENCOUNTER — Ambulatory Visit: Payer: Medicare Other | Admitting: Physical Therapy

## 2012-01-30 ENCOUNTER — Encounter: Payer: Medicare Other | Admitting: Occupational Therapy

## 2012-02-01 ENCOUNTER — Ambulatory Visit: Payer: Medicare Other | Admitting: Physical Therapy

## 2012-02-01 ENCOUNTER — Ambulatory Visit: Payer: Medicare Other | Admitting: Occupational Therapy

## 2012-02-06 ENCOUNTER — Encounter: Payer: Medicare Other | Admitting: Occupational Therapy

## 2012-02-06 ENCOUNTER — Ambulatory Visit: Payer: Medicare Other | Admitting: Physical Therapy

## 2012-02-08 ENCOUNTER — Ambulatory Visit: Payer: Medicare Other | Admitting: Occupational Therapy

## 2012-02-08 ENCOUNTER — Ambulatory Visit: Payer: Medicare Other | Admitting: Physical Therapy

## 2012-02-11 ENCOUNTER — Other Ambulatory Visit: Payer: Self-pay | Admitting: Internal Medicine

## 2012-02-15 ENCOUNTER — Ambulatory Visit: Payer: Medicare Other | Attending: Neurology | Admitting: Physical Therapy

## 2012-02-15 ENCOUNTER — Telehealth: Payer: Self-pay | Admitting: *Deleted

## 2012-02-15 DIAGNOSIS — M25519 Pain in unspecified shoulder: Secondary | ICD-10-CM | POA: Insufficient documentation

## 2012-02-15 DIAGNOSIS — Z5189 Encounter for other specified aftercare: Secondary | ICD-10-CM | POA: Insufficient documentation

## 2012-02-15 DIAGNOSIS — I69998 Other sequelae following unspecified cerebrovascular disease: Secondary | ICD-10-CM | POA: Insufficient documentation

## 2012-02-15 DIAGNOSIS — M6281 Muscle weakness (generalized): Secondary | ICD-10-CM | POA: Insufficient documentation

## 2012-02-15 DIAGNOSIS — R279 Unspecified lack of coordination: Secondary | ICD-10-CM | POA: Insufficient documentation

## 2012-02-15 DIAGNOSIS — R269 Unspecified abnormalities of gait and mobility: Secondary | ICD-10-CM | POA: Insufficient documentation

## 2012-02-15 NOTE — Telephone Encounter (Signed)
Thank you for this note.  I'm happy to evaluate on 5/22.  Sounds like he needs additional anti-HTN.

## 2012-02-15 NOTE — Telephone Encounter (Signed)
[name] from neurorehab called to report hypertension reading at rehab today - no change in [name] complaints or behavior - states that [name] has differing levels of balance on days of rehab in past and today was unsteady but no worse than has been on other encounters. 1st reading 170/60, 2nd reading 195/74. Call to [name]- states [name] "feels like usual" - denies missing med dose, running out of medication, h/a or any other discomfort (except knees sore)-  [name] has appt on May 22 with his PCP Dr. Manson Passey Message forwarded to attending physician in clinic to determine if [name] needs to be seen today or before 5/22. Dorie Rank, RN, 02/15/2012, 10:14A

## 2012-02-15 NOTE — Telephone Encounter (Signed)
Dr. Rogelia Boga advises pt should call if he notes any other symptoms such as h/a, change is mentation, weakness ( especially asymmetric) but can wait til see Dr. Manson Passey on 5/22 if no other concerns occur. Pt so informed and verbalizes understanding of need to call for change in condition. Msg routed to Dr. Manson Passey for his info also. Dorie Rank, RN, 02/15/2012, 10:22A

## 2012-02-15 NOTE — Telephone Encounter (Signed)
Office readings 150-170 consistently. These BP's were probably not at rest. Agree with office visit with PCP.

## 2012-02-16 ENCOUNTER — Emergency Department (HOSPITAL_COMMUNITY): Payer: Medicare Other

## 2012-02-16 ENCOUNTER — Other Ambulatory Visit: Payer: Self-pay

## 2012-02-16 ENCOUNTER — Emergency Department (HOSPITAL_COMMUNITY)
Admission: EM | Admit: 2012-02-16 | Discharge: 2012-02-16 | Disposition: A | Discharge status: Home / Self Care | Payer: Medicare Other | Attending: Emergency Medicine | Admitting: Emergency Medicine

## 2012-02-16 ENCOUNTER — Encounter (HOSPITAL_COMMUNITY): Payer: Self-pay | Admitting: *Deleted

## 2012-02-16 DIAGNOSIS — I4510 Unspecified right bundle-branch block: Secondary | ICD-10-CM | POA: Insufficient documentation

## 2012-02-16 DIAGNOSIS — R079 Chest pain, unspecified: Secondary | ICD-10-CM | POA: Insufficient documentation

## 2012-02-16 DIAGNOSIS — E162 Hypoglycemia, unspecified: Secondary | ICD-10-CM

## 2012-02-16 DIAGNOSIS — Z794 Long term (current) use of insulin: Secondary | ICD-10-CM | POA: Insufficient documentation

## 2012-02-16 DIAGNOSIS — R4182 Altered mental status, unspecified: Secondary | ICD-10-CM | POA: Insufficient documentation

## 2012-02-16 DIAGNOSIS — E1169 Type 2 diabetes mellitus with other specified complication: Secondary | ICD-10-CM | POA: Insufficient documentation

## 2012-02-16 DIAGNOSIS — Z8673 Personal history of transient ischemic attack (TIA), and cerebral infarction without residual deficits: Secondary | ICD-10-CM | POA: Insufficient documentation

## 2012-02-16 DIAGNOSIS — Z87891 Personal history of nicotine dependence: Secondary | ICD-10-CM | POA: Insufficient documentation

## 2012-02-16 DIAGNOSIS — I10 Essential (primary) hypertension: Secondary | ICD-10-CM | POA: Insufficient documentation

## 2012-02-16 HISTORY — DX: Essential (primary) hypertension: I10

## 2012-02-16 HISTORY — DX: Cerebral infarction, unspecified: I63.9

## 2012-02-16 LAB — CBC
MCV: 83 fL (ref 78.0–100.0)
Platelets: 184 10*3/uL (ref 150–400)
RBC: 5.07 MIL/uL (ref 4.22–5.81)
WBC: 7.9 10*3/uL (ref 4.0–10.5)

## 2012-02-16 LAB — COMPREHENSIVE METABOLIC PANEL
ALT: 18 U/L (ref 0–53)
Alkaline Phosphatase: 67 U/L (ref 39–117)
CO2: 31 mEq/L (ref 19–32)
Chloride: 106 mEq/L (ref 96–112)
GFR calc Af Amer: 86 mL/min — ABNORMAL LOW (ref >90)
GFR calc non Af Amer: 74 mL/min — ABNORMAL LOW (ref >90)
Glucose, Bld: 81 mg/dL (ref 70–99)
Potassium: 4.1 mEq/L (ref 3.5–5.1)
Sodium: 145 mEq/L (ref 135–145)

## 2012-02-16 LAB — DIFFERENTIAL
Eosinophils Relative: 0 % (ref 0–5)
Lymphocytes Relative: 13 % (ref 12–46)
Lymphs Abs: 1 10*3/uL (ref 0.7–4.0)
Neutrophils Relative %: 78 % — ABNORMAL HIGH (ref 43–77)

## 2012-02-16 NOTE — ED Notes (Signed)
CBG 94 mg/dl checked by glucometer in the ED

## 2012-02-16 NOTE — ED Provider Notes (Signed)
History     CSN: [id number]  Arrival date & time 02/16/12  1140   First MD Initiated Contact with Patient 02/16/12 1153      Chief Complaint  Patient presents with  . Hypoglycemia     HPI The patient presents after being found on the floor by his wife just prior to arrival.  Patient recalls all events prior to his wife finding him.  He states that after awakening this morning, approximately 5 hours ago he went into the kitchen counter medication, and felt weak, lowering himself to the floor.  He states that he was on the floor, weak for approximately 2 hours before being found by his wife.  During that time he denies any confusion, disorientation, vomiting, diarrhea.  He does note that he had weakness on his left side.  The wife notes that upon her discovering the patient he was weak on the left, with no facial asymmetry and slow to respond.  She checked his blood sugar and found a value of 26.  Troponin food, and after some time the weakness improved substantially, though not entirely.  Per EMS report the patient's glucose was increased on their arrival.  And the patient was unremarkable in route to the facility.  On arrival the patient notes that he is back to "normal".  He denies a focal pain, persistency of the left-sided weakness, confusion, nausea, vomiting, diarrhea.  Notably, the patient has a history of stroke on the right side, with left-sided deficits at that time.  He has been compliant with his medication, including his insulin. Past Medical History  Diagnosis Date  . Stroke   . Diabetes mellitus   . Hypertension     History reviewed. No pertinent past surgical history.  History reviewed. No pertinent family history.  History  Substance Use Topics  . Smoking status: Former Games developer  . Smokeless tobacco: Not on file  . Alcohol Use: No      Review of Systems  Constitutional:       Per HPI, otherwise negative  HENT:       Per HPI, otherwise negative  Eyes: Negative.     Respiratory:       Per HPI, otherwise negative  Cardiovascular:       Per HPI, otherwise negative  Gastrointestinal: Negative for vomiting.  Genitourinary: Negative.   Musculoskeletal:       Per HPI, otherwise negative  Skin: Negative.   Neurological: Negative for syncope.    Allergies  Pioglitazone and Rosiglitazone maleate  Home Medications   Current Outpatient Rx  Name Route Sig Dispense Refill  . AMLODIPINE BESYLATE 10 MG PO TABS Oral Take 10 mg by mouth daily.    Marland Kitchen CARVEDILOL 25 MG PO TABS Oral Take 12.5 mg by mouth 2 (two) times daily with a meal.     . CITALOPRAM HYDROBROMIDE 10 MG PO TABS Oral Take 10 mg by mouth at bedtime.    . CLOPIDOGREL BISULFATE 75 MG PO TABS Oral Take 75 mg by mouth daily.    Marland Kitchen DIVALPROEX SODIUM ER 500 MG PO TB24 Oral Take 500 mg by mouth daily.    . EPLERENONE 50 MG PO TABS Oral Take 50 mg by mouth 2 (two) times daily.    . FUROSEMIDE 80 MG PO TABS Oral Take 80 mg by mouth daily.    Marland Kitchen GABAPENTIN 600 MG PO TABS Oral Take 600 mg by mouth 3 (three) times daily.    . INSULIN GLARGINE 100 UNIT/ML [place]  SOLN Subcutaneous Inject 45 Units into the skin at bedtime.     Marland Kitchen LISINOPRIL 20 MG PO TABS Oral Take 20 mg by mouth daily.    . SENNA 8.6 MG PO TABS Oral Take 1 tablet by mouth daily.    Marland Kitchen SIMVASTATIN 40 MG PO TABS Oral Take 40 mg by mouth at bedtime.    . TERAZOSIN HCL 5 MG PO CAPS Oral Take 5 mg by mouth at bedtime.      BP 187/68  Pulse 70  Temp(Src) 98.3 F (36.8 C) (Oral)  Resp 14  SpO2 95%  Physical Exam  Nursing note and vitals reviewed. Constitutional: He is oriented to person, place, and time. He appears well-developed. No distress.  HENT:  Head: Normocephalic and atraumatic.  Eyes: Conjunctivae and EOM are normal.  Cardiovascular: Normal rate and regular rhythm.   Pulmonary/Chest: Effort normal. No stridor. No respiratory distress.  Abdominal: He exhibits no distension.  Musculoskeletal: He exhibits no edema.  Neurological: He is  alert and oriented to person, place, and time.       Sym strength in both UE/LE (5/5)  Skin: Skin is warm and dry.  Psychiatric: He has a normal mood and affect.    ED Course  Procedures (including critical care time)   Labs Reviewed  GLUCOSE, CAPILLARY  CBC  DIFFERENTIAL  COMPREHENSIVE METABOLIC PANEL  URINALYSIS, ROUTINE W REFLEX MICROSCOPIC   Dg Chest 2 View  02/16/2012  *RADIOLOGY REPORT*  Clinical Data: Chest pain.  CHEST - 2 VIEW  Comparison: 07/12/2010.  Findings: The cardiac silhouette, mediastinal and hilar contours are within normal limits and stable.  The lungs are clear.  No pleural effusion or pneumothorax.  The bony thorax is intact.  IMPRESSION: No acute cardiopulmonary findings.  The  Original Report Authenticated By: P. Loralie Champagne, M.D.   Ct Head Wo Contrast  02/16/2012  *RADIOLOGY REPORT*  Clinical Data: Acute mental status changes.  Hypoglycemia.  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  CT HEAD WITHOUT CONTRAST 02/16/2012:  Technique:  Contiguous axial images were obtained from the base of the skull through the vertex without contrast.  Comparison: MRI brain 07/14/2010.  Unenhanced cranial CT 07/12/2010, 08/02/2008.  Findings: Metallic beam hardening streak artifact from the right MCA bifurcation aneurysm clip.  Old stroke in the anterior right basal ganglia, unchanged.  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 Ventricular system normal in size and appearance for age.  No mass lesion.  No midline shift.  No acute hemorrhage or hematoma.  No extra-axial fluid collections.  No evidence of acute infarction. No significant interval change.  Prior right frontotemporal craniotomy. Visualized paranasal sinuses, mastoid air cells, and middle ear cavities well-aerated. Extensive bilateral carotid siphon and left tibial artery atherosclerosis.  IMPRESSION:  1.  No acute intracranial abnormality. 2.  Old stroke in the  anterior right basal ganglia. 3.  Prior right MCA bifurcation aneurysm clipping. 4.  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  Original Report Authenticated By: Arnell Sieving, M.D.     No diagnosis found.  Cardiac: 70 sinus rhythm normal Pulse oximetry 99% room air normal  Date: 02/16/2012  Rate: 66  Rhythm: normal sinus rhythm  QRS Axis: normal  Intervals: PR prolonged  ST/T Wave abnormalities: nonspecific T wave changes  Conduction Disutrbances:right bundle branch block  Narrative Interpretation:   Old EKG Reviewed: unchanged  ABNORMAL _UNCHANGED  MDM  This elderly male with multiple medical problems now presents following an episode of hypoglycemia.  Although the patient described left-sided weakness, this likely represents an episode of diaskesis, as on my exam he has no focal neurologic deficits.  Throughout the emergency room stay the patient was hemodynamically stable, did not develop any new complaints.  The patient's radiographic studies, labs were reassuring.  Given this observation period without any decompensation, the patient is appropriate for discharge with continued management by his primary care physician.     Gerhard Munch, MD 02/16/12 1452

## 2012-02-16 NOTE — ED Notes (Signed)
Patient transported to X-ray 

## 2012-02-16 NOTE — Discharge Instructions (Signed)

## 2012-02-16 NOTE — ED Notes (Signed)
To ED for eval after pt was found on the floor this am. Wife found pt on floor this am with blood sugar 26. Pt states he slid to the floor in the kitchen. Wife states she gave pt applesauce with sugar and once pt got off floor he was dragging his left arm and leg. Hx of stroke with left side weakness, but not as weak as he was this am. Pt states he feels fine now.

## 2012-02-21 ENCOUNTER — Encounter: Payer: Self-pay | Admitting: Internal Medicine

## 2012-02-21 ENCOUNTER — Ambulatory Visit (INDEPENDENT_AMBULATORY_CARE_PROVIDER_SITE_OTHER): Payer: Medicare Other | Admitting: Internal Medicine

## 2012-02-21 VITALS — BP 135/60 | HR 66 | Temp 97.8°F | Ht 67.0 in | Wt 226.5 lb

## 2012-02-21 DIAGNOSIS — E785 Hyperlipidemia, unspecified: Secondary | ICD-10-CM

## 2012-02-21 DIAGNOSIS — E119 Type 2 diabetes mellitus without complications: Secondary | ICD-10-CM

## 2012-02-21 DIAGNOSIS — I10 Essential (primary) hypertension: Secondary | ICD-10-CM

## 2012-02-21 MED ORDER — INSULIN GLARGINE 100 UNIT/ML ~~LOC~~ SOLN: 35.0000 [IU] | Freq: Every day | SUBCUTANEOUS | Status: DC

## 2012-02-21 NOTE — Assessment & Plan Note (Signed)
Lab Results  Component Value Date   HGBA1C 6.2 02/21/2012   HGBA1C 8.1 06/14/2010   CREATININE 1.01 02/16/2012   CREATININE 1.35 03/16/2011   MICROALBUR 0.50 08/10/2009   MICRALBCREAT 8.3 08/10/2009   CHOL  Value: 117        ATP III CLASSIFICATION:  <200     mg/dL   Desirable  478-295  mg/dL   Borderline High  >=621    mg/dL   High        12/20/6576   HDL 33* 07/13/2010   TRIG 87 07/13/2010    Last eye exam and foot exam: No results found for this basename: HMDIABEYEEXA, HMDIABFOOTEX    Assessment: Diabetes control: controlled Progress toward goals: improved Barriers to meeting goals: no barriers identified  Plan: Diabetes treatment: continue current medications Decrease lantus to 35 units Refer to: none Instruction/counseling given: reminded to bring blood glucose meter & log to each visit and reminded to bring medications to each visit

## 2012-02-21 NOTE — Progress Notes (Signed)
Subjective:     Patient ID: [name], male   DOB: August 04, 1941, 71 y.o.   MRN: [id number]  HPI Mr. Goodpasture is a 71 year old man with history of CVA, HTN, DM II, and HLD who presents for ER follow up/  The patient was seen in the ER 02/16/2012 for episode of hypoglycemia where his blood sugar was in 20s. He was dizzy and lightheaded and found by his wife before he was taken to the ER. Recently his blood sugars have been low, and has lost weight in the last few months, but still taking the same amount of insulin.   Patient has no other complaints or concerns today. He denies chest pain, cough, sob, headache, N/V, changes in abdominal and urinary character.   Review of Systems  All other systems reviewed and are negative.       Objective:   Physical Exam  Constitutional: He is oriented to person, place, and time. He appears well-developed.  HENT:  Head: Normocephalic.  Eyes: Pupils are equal, round, and reactive to light.  Neck: Normal range of motion.  Cardiovascular: Normal rate and regular rhythm.   Pulmonary/Chest: Effort normal and breath sounds normal. He has no rales.  Musculoskeletal:       Patient walks with cane due to residual weakness from stroke  Neurological: He is alert and oriented to person, place, and time.

## 2012-02-21 NOTE — Patient Instructions (Signed)
Please follow up with Dr. Manson Passey as scheduled. Please decrease lantus to 35 units. Please record blood sugars at least twice a day until you see Dr. Manson Passey.

## 2012-02-21 NOTE — Assessment & Plan Note (Signed)
Lab Results  Component Value Date   NA 145 02/16/2012   K 4.1 02/16/2012   CL 106 02/16/2012   CO2 31 02/16/2012   BUN 19 02/16/2012   CREATININE 1.01 02/16/2012   CREATININE 1.35 03/16/2011    BP Readings from Last 3 Encounters:  02/21/12 135/60  02/16/12 187/68  06/28/11 162/66    Assessment: Hypertension control:  controlled  Progress toward goals:  at goal Barriers to meeting goals:  no barriers identified  Plan: Hypertension treatment:  continue current medications

## 2012-02-21 NOTE — Assessment & Plan Note (Signed)
Lab Results  Component Value Date   CHOL  Value: 117        ATP III CLASSIFICATION:  <200     mg/dL   Desirable  161-096  mg/dL   Borderline High  >=045    mg/dL   High        01/21/8118   HDL 33* 07/13/2010   LDLCALC  Value: 67        Total Cholesterol/HDL:CHD Risk Coronary Heart Disease Risk Table                     Men   Women  1/2 Average Risk   3.4   3.3  Average Risk       5.0   4.4  2 X Average Risk   9.6   7.1  3 X Average Risk  23.4   11.0        Use the calculated Patient Ratio above and the CHD Risk Table to determine the patient's CHD Risk.        ATP III CLASSIFICATION (LDL):  <100     mg/dL   Optimal  147-829  mg/dL   Near or Above                    Optimal  130-159  mg/dL   Borderline  562-130  mg/dL   High  >865     mg/dL   Very High 7/84/6962   TRIG 87 07/13/2010   CHOLHDL 3.5 07/13/2010   Patient needs lipids to be checked. Lab was instructed to collect, but not sure if they actually did and now patient has already left. If not collected, will make sure to get lipid panel at next visit.

## 2012-02-22 ENCOUNTER — Ambulatory Visit: Payer: Medicare Other | Admitting: Physical Therapy

## 2012-02-29 ENCOUNTER — Ambulatory Visit: Payer: Medicare Other | Admitting: Physical Therapy

## 2012-03-05 ENCOUNTER — Encounter: Payer: Self-pay | Admitting: Internal Medicine

## 2012-03-05 ENCOUNTER — Ambulatory Visit (INDEPENDENT_AMBULATORY_CARE_PROVIDER_SITE_OTHER): Payer: Medicare Other | Admitting: Internal Medicine

## 2012-03-05 VITALS — BP 131/61 | HR 58 | Temp 98.0°F | Ht 67.0 in | Wt 222.4 lb

## 2012-03-05 DIAGNOSIS — I6350 Cerebral infarction due to unspecified occlusion or stenosis of unspecified cerebral artery: Secondary | ICD-10-CM

## 2012-03-05 DIAGNOSIS — I10 Essential (primary) hypertension: Secondary | ICD-10-CM

## 2012-03-05 DIAGNOSIS — E119 Type 2 diabetes mellitus without complications: Secondary | ICD-10-CM

## 2012-03-05 DIAGNOSIS — E785 Hyperlipidemia, unspecified: Secondary | ICD-10-CM

## 2012-03-05 LAB — GLUCOSE, CAPILLARY: Glucose-Capillary: 124 mg/dL — ABNORMAL HIGH (ref 70–99)

## 2012-03-05 NOTE — Assessment & Plan Note (Signed)
The patient presents after an episode of hypoglycemia on 5/4, and after decreasing his lantus from 45 units to 35 units on 5/9 at his last office visit.  Since that time, his glucometer shows well-controlled CBG's, without episodes of hypoglycemia since 5/11.  We discussed the importance of only using lantus once/day, and taking it at the same time each day.  He has no CRI to suggest decreased clearance of insulin. -continue lantus 35 units -continue home CBG monitoring -A1C = 6.2 today.  Will tolerate a higher A1C to prevent further hypoglycemic episodes -return in 3 months

## 2012-03-05 NOTE — Assessment & Plan Note (Signed)
Well-controlled, continue current regimen 

## 2012-03-05 NOTE — Progress Notes (Signed)
HPI The patient is a 71 yo man, history of prior CVA, DM, HTN, and HL, presenting for a routine follow-up.  Before starting the patient's visit, his wife pulled me aside to share some confidential information.  Please see the "documentation" encounter for the patient's wife from today (03/05/12) for further information if needed.  The patient was recently seen in the ED on 02/16/12 for an episode of hypoglycemia, with CBG = 26.  The patient had a follow-up visit in clinic on 02/21/12, where the patient's Lantus was decreased from 45 units daily to 35 units daily.  The patient's glucometer report notes several hypoglycemic episodes prior to 5/9, and hypoglycemia on 5/10 and 5/11, but subsequently with no further episodes of hypoglycemia.  During these episodes, the patient notes no symptoms of tremulousness, confusion, or weakness.  The patient also notes chronic left shoulder pain.  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  He now notes a 58-month history of mild "soreness" in his left shoulder when reaching for high objects.  ROS: General: no fevers, chills, changes in weight, changes in appetite Skin: no rash HEENT: no blurry vision, hearing changes, sore throat Pulm: no dyspnea, coughing, wheezing CV: no chest pain, palpitations, shortness of breath Abd: no abdominal pain, nausea/vomiting, diarrhea/constipation GU: no dysuria, hematuria, polyuria Ext: no arthralgias, myalgias Neuro: no weakness, numbness, or tingling  Filed Vitals:   03/05/12 1551  BP: 131/61  Pulse: 58  Temp: 98 F (36.7 C)    PEX General: alert, cooperative, and in no apparent distress HEENT: pupils equal round and reactive to light, vision grossly intact, oropharynx clear and non-erythematous  Neck: supple, no lymphadenopathy Lungs: clear to ascultation bilaterally, normal work of respiration, no wheezes, rales, ronchi Heart: regular rate and rhythm, no  murmurs, gallops, or rubs Abdomen: soft, non-tender, non-distended, normal bowel sounds Extremities: LUE with limited active left shoulder abduction, but with full passive left shoulder ROM.  Mild pain produced upon full left shoulder abduction and flexion, with mildly positive Neer and Hawkins tests for impingement. Neurologic: alert & oriented X3, cranial nerves II-XII intact, strength 4/5 in LUE and LLE, otherwise 5/5 throughout, sensation grossly intact to light touch  Assessment/Plan

## 2012-03-05 NOTE — Patient Instructions (Addendum)
For your Diabetes, your blood sugar levels have been well-controlled since changing to the lower dose of lantus.  Continue taking Lantus 35 units per day.  For your shoulder pain, you may use heat or ice packs for relief of symptoms.  You may also use over-the-counter ibuprofen as an anti-inflammatory.  We are checking your cholesterol today, and will call you if the results are abnormal.  Please return for a follow-up visit in 3 months.

## 2012-03-05 NOTE — Assessment & Plan Note (Signed)
Last LDL = 67 in 2011.  History of CVA. -recheck lipid panel today

## 2012-03-06 LAB — LIPID PANEL
HDL: 36 mg/dL — ABNORMAL LOW (ref >39)
LDL Cholesterol: 109 mg/dL — ABNORMAL HIGH (ref 0–99)
Total CHOL/HDL Ratio: 4.7 Ratio
VLDL: 25 mg/dL (ref 0–40)

## 2012-03-07 ENCOUNTER — Ambulatory Visit: Payer: Medicare Other | Admitting: Physical Therapy

## 2012-03-16 ENCOUNTER — Other Ambulatory Visit: Payer: Self-pay | Admitting: Internal Medicine

## 2012-04-28 NOTE — Addendum Note (Signed)
Addended by: Bufford Spikes on: 04/28/2012 01:15 PM   Modules accepted: Orders

## 2012-05-04 DIAGNOSIS — Y998 Other external cause status: Secondary | ICD-10-CM

## 2012-05-04 DIAGNOSIS — Z8673 Personal history of transient ischemic attack (TIA), and cerebral infarction without residual deficits: Secondary | ICD-10-CM

## 2012-05-04 DIAGNOSIS — E870 Hyperosmolality and hypernatremia: Secondary | ICD-10-CM | POA: Diagnosis present

## 2012-05-04 DIAGNOSIS — G4733 Obstructive sleep apnea (adult) (pediatric): Secondary | ICD-10-CM | POA: Diagnosis present

## 2012-05-04 DIAGNOSIS — I10 Essential (primary) hypertension: Secondary | ICD-10-CM | POA: Diagnosis present

## 2012-05-04 DIAGNOSIS — Y846 Urinary catheterization as the cause of abnormal reaction of the patient, or of later complication, without mention of misadventure at the time of the procedure: Secondary | ICD-10-CM | POA: Diagnosis present

## 2012-05-04 DIAGNOSIS — IMO0002 Reserved for concepts with insufficient information to code with codable children: Secondary | ICD-10-CM | POA: Diagnosis not present

## 2012-05-04 DIAGNOSIS — R5383 Other fatigue: Principal | ICD-10-CM | POA: Diagnosis present

## 2012-05-04 DIAGNOSIS — E119 Type 2 diabetes mellitus without complications: Secondary | ICD-10-CM | POA: Diagnosis present

## 2012-05-04 DIAGNOSIS — R296 Repeated falls: Secondary | ICD-10-CM | POA: Diagnosis present

## 2012-05-04 DIAGNOSIS — Z9181 History of falling: Secondary | ICD-10-CM

## 2012-05-04 DIAGNOSIS — T43205A Adverse effect of unspecified antidepressants, initial encounter: Secondary | ICD-10-CM | POA: Diagnosis present

## 2012-05-04 DIAGNOSIS — K219 Gastro-esophageal reflux disease without esophagitis: Secondary | ICD-10-CM | POA: Diagnosis present

## 2012-05-04 DIAGNOSIS — R5381 Other malaise: Principal | ICD-10-CM | POA: Diagnosis present

## 2012-05-04 DIAGNOSIS — Y9210 Unspecified residential institution as the place of occurrence of the external cause: Secondary | ICD-10-CM | POA: Diagnosis present

## 2012-05-05 ENCOUNTER — Encounter (HOSPITAL_COMMUNITY): Payer: Self-pay | Admitting: *Deleted

## 2012-05-05 ENCOUNTER — Inpatient Hospital Stay (HOSPITAL_COMMUNITY): Payer: Medicare Other

## 2012-05-05 ENCOUNTER — Emergency Department (HOSPITAL_COMMUNITY): Payer: Medicare Other

## 2012-05-05 ENCOUNTER — Inpatient Hospital Stay (HOSPITAL_COMMUNITY)
Admission: EM | Admit: 2012-05-05 | Discharge: 2012-05-10 | DRG: 948 | Disposition: A | Discharge status: Skilled Nursing Facility | Payer: Medicare Other | Attending: Internal Medicine | Admitting: Internal Medicine

## 2012-05-05 DIAGNOSIS — S3730XA Unspecified injury of urethra, initial encounter: Secondary | ICD-10-CM | POA: Clinically undetermined

## 2012-05-05 DIAGNOSIS — R296 Repeated falls: Secondary | ICD-10-CM

## 2012-05-05 DIAGNOSIS — E1149 Type 2 diabetes mellitus with other diabetic neurological complication: Secondary | ICD-10-CM | POA: Diagnosis present

## 2012-05-05 DIAGNOSIS — F329 Major depressive disorder, single episode, unspecified: Secondary | ICD-10-CM

## 2012-05-05 DIAGNOSIS — R4182 Altered mental status, unspecified: Secondary | ICD-10-CM | POA: Diagnosis present

## 2012-05-05 DIAGNOSIS — E870 Hyperosmolality and hypernatremia: Secondary | ICD-10-CM | POA: Diagnosis present

## 2012-05-05 DIAGNOSIS — E119 Type 2 diabetes mellitus without complications: Secondary | ICD-10-CM

## 2012-05-05 DIAGNOSIS — R609 Edema, unspecified: Secondary | ICD-10-CM

## 2012-05-05 DIAGNOSIS — I6350 Cerebral infarction due to unspecified occlusion or stenosis of unspecified cerebral artery: Secondary | ICD-10-CM | POA: Diagnosis present

## 2012-05-05 DIAGNOSIS — R531 Weakness: Secondary | ICD-10-CM | POA: Diagnosis present

## 2012-05-05 DIAGNOSIS — I10 Essential (primary) hypertension: Secondary | ICD-10-CM | POA: Diagnosis present

## 2012-05-05 HISTORY — DX: Sleep apnea, unspecified: G47.30

## 2012-05-05 LAB — COMPREHENSIVE METABOLIC PANEL
Alkaline Phosphatase: 54 U/L (ref 39–117)
BUN: 18 mg/dL (ref 6–23)
CO2: 32 mEq/L (ref 19–32)
Chloride: 107 mEq/L (ref 96–112)
GFR calc Af Amer: 78 mL/min — ABNORMAL LOW (ref >90)
GFR calc non Af Amer: 67 mL/min — ABNORMAL LOW (ref >90)
Glucose, Bld: 95 mg/dL (ref 70–99)
Potassium: 3.5 mEq/L (ref 3.5–5.1)
Total Bilirubin: 0.8 mg/dL (ref 0.3–1.2)
Total Protein: 6.3 g/dL (ref 6.0–8.3)

## 2012-05-05 LAB — BASIC METABOLIC PANEL
CO2: 36 mEq/L — ABNORMAL HIGH (ref 19–32)
Calcium: 8.6 mg/dL (ref 8.4–10.5)
GFR calc Af Amer: 60 mL/min — ABNORMAL LOW (ref >90)
GFR calc non Af Amer: 52 mL/min — ABNORMAL LOW (ref >90)
Potassium: 3.9 mEq/L (ref 3.5–5.1)
Sodium: 146 mEq/L — ABNORMAL HIGH (ref 135–145)

## 2012-05-05 LAB — RETICULOCYTES
RBC.: 4.76 MIL/uL (ref 4.22–5.81)
Retic Count, Absolute: 52.4 10*3/uL (ref 19.0–186.0)

## 2012-05-05 LAB — TROPONIN I: Troponin I: <0.3 ng/mL (ref <0.30)

## 2012-05-05 LAB — COMPREHENSIVE METABOLIC PANEL WITH GFR
ALT: 42 U/L (ref 0–53)
AST: 40 U/L — ABNORMAL HIGH (ref 0–37)
Albumin: 3 g/dL — ABNORMAL LOW (ref 3.5–5.2)
Calcium: 9 mg/dL (ref 8.4–10.5)
Creatinine, Ser: 1.09 mg/dL (ref 0.50–1.35)
Sodium: 147 meq/L — ABNORMAL HIGH (ref 135–145)

## 2012-05-05 LAB — CBC WITH DIFFERENTIAL/PLATELET
Basophils Absolute: 0 K/uL (ref 0.0–0.1)
Basophils Relative: 0 % (ref 0–1)
Eosinophils Absolute: 0.1 K/uL (ref 0.0–0.7)
Eosinophils Relative: 1 % (ref 0–5)
HCT: 41.7 % (ref 39.0–52.0)
Hemoglobin: 12.9 g/dL — ABNORMAL LOW (ref 13.0–17.0)
Lymphocytes Relative: 26 % (ref 12–46)
Lymphs Abs: 2.1 10*3/uL (ref 0.7–4.0)
MCH: 26.1 pg (ref 26.0–34.0)
MCHC: 30.9 g/dL (ref 30.0–36.0)
MCV: 84.2 fL (ref 78.0–100.0)
Monocytes Absolute: 1 10*3/uL (ref 0.1–1.0)
Monocytes Relative: 13 % — ABNORMAL HIGH (ref 3–12)
Neutro Abs: 4.7 10*3/uL (ref 1.7–7.7)
Neutrophils Relative %: 59 % (ref 43–77)
Platelets: 147 10*3/uL — ABNORMAL LOW (ref 150–400)
RBC: 4.95 MIL/uL (ref 4.22–5.81)
RDW: 17.1 % — ABNORMAL HIGH (ref 11.5–15.5)
WBC: 7.8 K/uL (ref 4.0–10.5)

## 2012-05-05 LAB — VITAMIN B12: Vitamin B-12: 870 pg/mL (ref 211–911)

## 2012-05-05 LAB — PRO B NATRIURETIC PEPTIDE: Pro B Natriuretic peptide (BNP): 361.4 pg/mL — ABNORMAL HIGH (ref 0–125)

## 2012-05-05 LAB — TSH: TSH: 1.305 u[IU]/mL (ref 0.350–4.500)

## 2012-05-05 LAB — IRON AND TIBC
Iron: 31 ug/dL — ABNORMAL LOW (ref 42–135)
Saturation Ratios: 17 % — ABNORMAL LOW (ref 20–55)
UIBC: 150 ug/dL (ref 125–400)

## 2012-05-05 LAB — GLUCOSE, CAPILLARY
Glucose-Capillary: 187 mg/dL — ABNORMAL HIGH (ref 70–99)
Glucose-Capillary: 186 mg/dL — ABNORMAL HIGH (ref 70–99)

## 2012-05-05 LAB — FOLATE: Folate: >20 ng/mL

## 2012-05-05 MED ORDER — ACETAMINOPHEN 650 MG RE SUPP: 650.0000 mg | Freq: Four times a day (QID) | RECTAL | Status: DC | PRN

## 2012-05-05 MED ORDER — HEPARIN SODIUM (PORCINE) 5000 UNIT/ML IJ SOLN
5000.0000 [IU] | Freq: Three times a day (TID) | INTRAMUSCULAR | Status: DC
  Administered 2012-05-05 – 2012-05-10 (×13): 5000 [IU] via SUBCUTANEOUS
  Filled 2012-05-05 (×18): qty 1

## 2012-05-05 MED ORDER — INSULIN GLARGINE 100 UNIT/ML ~~LOC~~ SOLN: 35.0000 [IU] | Freq: Every day | SUBCUTANEOUS | Status: DC

## 2012-05-05 MED ORDER — CLOPIDOGREL BISULFATE 75 MG PO TABS
75.0000 mg | ORAL_TABLET | Freq: Every day | ORAL | Status: DC
  Administered 2012-05-05 – 2012-05-10 (×6): 75 mg via ORAL
  Filled 2012-05-05 (×7): qty 1

## 2012-05-05 MED ORDER — TRAZODONE HCL 50 MG PO TABS
50.0000 mg | ORAL_TABLET | Freq: Every evening | ORAL | Status: DC | PRN
  Filled 2012-05-05: qty 1

## 2012-05-05 MED ORDER — INSULIN GLARGINE 100 UNIT/ML ~~LOC~~ SOLN
20.0000 [IU] | Freq: Every day | SUBCUTANEOUS | Status: DC
  Administered 2012-05-05 – 2012-05-09 (×5): 20 [IU] via SUBCUTANEOUS

## 2012-05-05 MED ORDER — ONDANSETRON HCL 4 MG PO TABS: 4.0000 mg | ORAL_TABLET | Freq: Four times a day (QID) | ORAL | Status: DC | PRN

## 2012-05-05 MED ORDER — AMLODIPINE BESYLATE 10 MG PO TABS
10.0000 mg | ORAL_TABLET | Freq: Every day | ORAL | Status: DC
  Administered 2012-05-05 – 2012-05-10 (×6): 10 mg via ORAL
  Filled 2012-05-05 (×6): qty 1

## 2012-05-05 MED ORDER — DEXTROSE 5 % IV SOLN
INTRAVENOUS | Status: DC
  Administered 2012-05-05 – 2012-05-06 (×2): via INTRAVENOUS

## 2012-05-05 MED ORDER — FUROSEMIDE 80 MG PO TABS
80.0000 mg | ORAL_TABLET | Freq: Every day | ORAL | Status: DC
  Administered 2012-05-05 – 2012-05-10 (×6): 80 mg via ORAL
  Filled 2012-05-05 (×6): qty 1

## 2012-05-05 MED ORDER — SPIRONOLACTONE 25 MG PO TABS
25.0000 mg | ORAL_TABLET | Freq: Every day | ORAL | Status: DC
  Administered 2012-05-05 – 2012-05-10 (×6): 25 mg via ORAL
  Filled 2012-05-05 (×6): qty 1

## 2012-05-05 MED ORDER — ATORVASTATIN CALCIUM 20 MG PO TABS
20.0000 mg | ORAL_TABLET | Freq: Every day | ORAL | Status: DC
  Administered 2012-05-05 – 2012-05-09 (×4): 20 mg via ORAL
  Filled 2012-05-05 (×6): qty 1

## 2012-05-05 MED ORDER — ACETAMINOPHEN 325 MG PO TABS
650.0000 mg | ORAL_TABLET | Freq: Four times a day (QID) | ORAL | Status: DC | PRN
  Administered 2012-05-06 (×2): 650 mg via ORAL
  Filled 2012-05-05 (×2): qty 2

## 2012-05-05 MED ORDER — SIMVASTATIN 40 MG PO TABS: 40.0000 mg | ORAL_TABLET | Freq: Every day | ORAL | Status: DC

## 2012-05-05 MED ORDER — DOCUSATE SODIUM 100 MG PO CAPS
100.0000 mg | ORAL_CAPSULE | Freq: Two times a day (BID) | ORAL | Status: DC
  Administered 2012-05-05 – 2012-05-06 (×2): 100 mg via ORAL
  Filled 2012-05-05 (×4): qty 1

## 2012-05-05 MED ORDER — SODIUM CHLORIDE 0.9 % IJ SOLN
3.0000 mL | Freq: Two times a day (BID) | INTRAMUSCULAR | Status: DC
  Administered 2012-05-05 – 2012-05-09 (×8): 3 mL via INTRAVENOUS

## 2012-05-05 MED ORDER — ONDANSETRON HCL 4 MG/2ML IJ SOLN: 4.0000 mg | Freq: Four times a day (QID) | INTRAMUSCULAR | Status: DC | PRN

## 2012-05-05 MED ORDER — LISINOPRIL 20 MG PO TABS
20.0000 mg | ORAL_TABLET | Freq: Every day | ORAL | Status: DC
  Administered 2012-05-05 – 2012-05-10 (×6): 20 mg via ORAL
  Filled 2012-05-05 (×6): qty 1

## 2012-05-05 MED ORDER — CARVEDILOL 25 MG PO TABS
25.0000 mg | ORAL_TABLET | Freq: Every day | ORAL | Status: DC
  Administered 2012-05-05 – 2012-05-07 (×3): 25 mg via ORAL
  Filled 2012-05-05 (×3): qty 1

## 2012-05-05 NOTE — Progress Notes (Signed)
Spoke with internal medicine MD on call regarding diagnosis of patient. MD stated that we are not ruling this patient out for a stroke therefore ok to start D5W at 157ml/hr. IV fluids started per MD order. Will continue to monitor patient closely. Ramond Craver, Rn

## 2012-05-05 NOTE — ED Notes (Signed)
[id number]<AV> Expected date:<BR> Expected time:<BR> Means of arrival:<BR> Comments:<BR> Downtime PT

## 2012-05-05 NOTE — Progress Notes (Signed)
Urine sample collected and sent to lab. Pt noted to have red urine with blood dripping from penis. Pt states that a "catheter was tried to be put in 3 times last night." MD made aware. No new orders. Will continue to monitor. [name], Charity fundraiser
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
## 2012-05-06 DIAGNOSIS — R4182 Altered mental status, unspecified: Secondary | ICD-10-CM

## 2012-05-06 DIAGNOSIS — R5383 Other fatigue: Principal | ICD-10-CM

## 2012-05-06 DIAGNOSIS — E870 Hyperosmolality and hypernatremia: Secondary | ICD-10-CM

## 2012-05-06 DIAGNOSIS — E119 Type 2 diabetes mellitus without complications: Secondary | ICD-10-CM

## 2012-05-06 LAB — URINE MICROSCOPIC-ADD ON

## 2012-05-06 LAB — BASIC METABOLIC PANEL
CO2: 37 mEq/L — ABNORMAL HIGH (ref 19–32)
Calcium: 8.9 mg/dL (ref 8.4–10.5)
Chloride: 105 mEq/L (ref 96–112)
Creatinine, Ser: 1.24 mg/dL (ref 0.50–1.35)
GFR calc Af Amer: 67 mL/min — ABNORMAL LOW (ref >90)
Glucose, Bld: 124 mg/dL — ABNORMAL HIGH (ref 70–99)
Potassium: 3.9 mEq/L (ref 3.5–5.1)
Sodium: 147 mEq/L — ABNORMAL HIGH (ref 135–145)

## 2012-05-06 LAB — URINALYSIS, ROUTINE W REFLEX MICROSCOPIC
Glucose, UA: NEGATIVE mg/dL
Ketones, ur: NEGATIVE mg/dL
Nitrite: NEGATIVE
Protein, ur: NEGATIVE mg/dL
Specific Gravity, Urine: 1.025 (ref 1.005–1.030)

## 2012-05-06 LAB — GLUCOSE, CAPILLARY
Glucose-Capillary: 107 mg/dL — ABNORMAL HIGH (ref 70–99)
Glucose-Capillary: 191 mg/dL — ABNORMAL HIGH (ref 70–99)

## 2012-05-06 LAB — RPR: RPR Ser Ql: NONREACTIVE

## 2012-05-06 MED ORDER — INSULIN ASPART 100 UNIT/ML ~~LOC~~ SOLN
0.0000 [IU] | Freq: Three times a day (TID) | SUBCUTANEOUS | Status: DC
  Administered 2012-05-08 – 2012-05-09 (×4): 1 [IU] via SUBCUTANEOUS
  Administered 2012-05-09: 2 [IU] via SUBCUTANEOUS
  Administered 2012-05-10 (×2): 1 [IU] via SUBCUTANEOUS

## 2012-05-06 MED ORDER — BISACODYL 10 MG RE SUPP
10.0000 mg | Freq: Every day | RECTAL | Status: DC | PRN
  Administered 2012-05-06: 10 mg via RECTAL
  Filled 2012-05-06: qty 1

## 2012-05-06 MED ORDER — DOCUSATE SODIUM 283 MG RE ENEM
1.0000 | ENEMA | Freq: Every day | RECTAL | Status: DC | PRN
  Filled 2012-05-06: qty 1

## 2012-05-06 MED ORDER — ASPIRIN EC 81 MG PO TBEC
81.0000 mg | DELAYED_RELEASE_TABLET | Freq: Every day | ORAL | Status: DC
  Administered 2012-05-07: 81 mg via ORAL
  Filled 2012-05-06 (×3): qty 1

## 2012-05-06 MED ORDER — SENNOSIDES-DOCUSATE SODIUM 8.6-50 MG PO TABS
1.0000 | ORAL_TABLET | Freq: Two times a day (BID) | ORAL | Status: DC
  Administered 2012-05-06 – 2012-05-10 (×9): 1 via ORAL
  Filled 2012-05-06 (×9): qty 1

## 2012-05-06 NOTE — Progress Notes (Signed)
I agree with the following treatment note after reviewing documentation.   Johnston, Threasa Kinch Brynn   OTR/L Pager: [phone] Office: [phone] .   

## 2012-05-06 NOTE — Progress Notes (Signed)
Occupational Therapy Evaluation Patient Details Name: [name] MRN: [id number] DOB: 15-Mar-1941 Today's Date: 05/06/2012 Time: 6213-0865 OT Time Calculation (min): 26 min  OT Assessment / Plan / Recommendation Clinical Impression  71 y.o. pt presents with AMS.  pt moving well with only mild instability when using RW. pt. reports that wife works, but unsure amount of time.  If wife not able to care for pt at home, then may need to consider SNF. Pt. will benefit from OT to maximize independence and safety in ADLs prior to d/c. OT to follow acutely.    OT Assessment  Patient needs continued OT Services    Follow Up Recommendations  Skilled nursing facility (pt. reports wife works-unsure of assistance she can give)    Barriers to Discharge      Equipment Recommendations  None recommended by PT    Recommendations for Other Services    Frequency  Min 2X/week    Precautions / Restrictions Precautions Precautions: Fall Restrictions Weight Bearing Restrictions: No   Pertinent Vitals/Pain Vitals Monitored and Stable. No pain reported    ADL  Grooming: Performed;Wash/dry face;Supervision/safety Where Assessed - Grooming: Supported standing Lower Body Dressing: Simulated;Moderate assistance Where Assessed - Lower Body Dressing: Unsupported sitting Toilet Transfer: Simulated;Supervision/safety Toilet Transfer Method: Sit to stand Equipment Used: Rolling walker;Gait belt ADL Comments: Pt. ambulated to sink with RW and washed face while standing supported with Supervision assist. Pt. sat EOB and simulated donning socks but had difficulty reaching feet and required ModA.    OT Diagnosis: Altered mental status  OT Problem List: Impaired UE functional use;Decreased knowledge of use of DME or AE;Impaired balance (sitting and/or standing);Decreased activity tolerance;Decreased range of motion OT Treatment Interventions: Self-care/ADL training;Therapeutic exercise;Therapeutic  activities;Patient/family education;Balance training   OT Goals Acute Rehab OT Goals OT Goal Formulation: With patient Time For Goal Achievement: 05/20/12 Potential to Achieve Goals: Good ADL Goals Pt Will Perform Grooming: Standing at sink;with modified independence ADL Goal: Grooming - Progress: Goal set today Pt Will Perform Upper Body Bathing: with modified independence;Sitting at sink ADL Goal: Upper Body Bathing - Progress: Goal set today Pt Will Perform Lower Body Bathing: with modified independence;Sit to stand from chair ADL Goal: Lower Body Bathing - Progress: Goal set today Pt Will Perform Upper Body Dressing: with modified independence;Sitting, chair;Sitting, bed ADL Goal: Upper Body Dressing - Progress: Goal set today Pt Will Perform Lower Body Dressing: Sit to stand from bed;Sit to stand from chair;with modified independence ADL Goal: Lower Body Dressing - Progress: Goal set today Pt Will Transfer to Toilet: with modified independence;Ambulation;Regular height toilet ADL Goal: Toilet Transfer - Progress: Goal set today Pt Will Perform Toileting - Clothing Manipulation: with modified independence;Standing ADL Goal: Toileting - Clothing Manipulation - Progress: Goal set today Pt Will Perform Toileting - Hygiene: with modified independence;Sit to stand from 3-in-1/toilet ADL Goal: Toileting - Hygiene - Progress: Goal set today Pt Will Perform Tub/Shower Transfer: Tub transfer;with modified independence;Ambulation ADL Goal: Tub/Shower Transfer - Progress: Goal set today  Visit Information  Last OT Received On: 05/06/12 Assistance Needed: +1 PT/OT Co-Evaluation/Treatment: Yes    Subjective Data   Pt. Pleasant in session.    Prior Functioning  Vision/Perception  Home Living Lives With: Spouse Available Help at Discharge: Family;Available PRN/intermittently (wife works part time, daughter lives near) Type of Home: House Home Access: Stairs to enter Water quality scientist of Steps: 3 Entrance Stairs-Rails: Right Home Layout: One level Bathroom Shower/Tub: Forensic scientist: Standard Bathroom Accessibility: Yes How Accessible:  Accessible via walker Home Adaptive Equipment: Wheelchair - manual;Walker - rolling;Straight cane;Shower chair with back Prior Function Level of Independence: Independent with assistive device(s) (cane) Able to Take Stairs?: Yes Driving: Yes Communication Communication: No difficulties Dominant Hand: Right      Cognition  Overall Cognitive Status: Appears within functional limits for tasks assessed/performed Arousal/Alertness: Awake/alert Orientation Level: Appears intact for tasks assessed Behavior During Session: [facility] for tasks performed Cognition - Other Comments: pt oriented and cognitively functional during therapy.      Extremity/Trunk Assessment Right Upper Extremity Assessment RUE ROM/Strength/Tone: Within functional levels (shortened DIP joint of second digit) RUE Sensation: WFL - Light Touch Left Upper Extremity Assessment LUE ROM/Strength/Tone: Deficits LUE ROM/Strength/Tone Deficits: AROM approx 30 degrees in shoulder flexion (CVA in past) LUE Sensation: WFL - Light Touch Right Lower Extremity Assessment RLE ROM/Strength/Tone: Within functional levels RLE Sensation: WFL - Light Touch Left Lower Extremity Assessment LLE ROM/Strength/Tone: Within functional levels LLE Sensation: WFL - Light Touch Trunk Assessment Trunk Assessment: Normal   Mobility Bed Mobility Bed Mobility: Supine to Sit;Sitting - Scoot to Edge of Bed Supine to Sit: 6: Modified independent (Device/Increase time);With rails Sitting - Scoot to Edge of Bed: 6: Modified independent (Device/Increase time) Details for Bed Mobility Assistance: pt required increased time Transfers Transfers: Sit to Stand;Stand to Sit Sit to Stand: 5: Supervision;With upper extremity assist;From bed Stand to Sit: 5:  Supervision;With upper extremity assist;To chair/3-in-1 Details for Transfer Assistance: cues for use of UEs and to get closer to chair prior to sitting.           End of Session OT - End of Session Activity Tolerance: Patient tolerated treatment well Patient left: in chair;with call bell/phone within reach  GO     Jenell Milliner 05/06/2012, 11:31 AM

## 2012-05-06 NOTE — H&P (Signed)
Internal Medicine Teaching Service Attending Note Date: 05/06/2012  Patient name: [name]  Medical record number: [id number]  Date of birth: 10/18/1940   I have seen and evaluated [name] and discussed their care with the Residency Team. Please see Dr Donnelly Stager H&P for full details. Briefly, [name] was admitted for generalized weakness, falls, confusion, and personality changes. His wife has sig concern regarding his behavior and her safety and called the Crisis line the last time she came with her husband to see Dr Manson Passey.   PMHx, meds, allergies, soc hx, and fam hx were reviewed  Filed Vitals:   05/06/12 1004 05/06/12 1009 05/06/12 1013 05/06/12 1400  BP: 179/81 197/74 192/71 153/63  Pulse: 63 93 94 61  Temp:    98.7 F (37.1 C)  TempSrc:    Oral  Resp:    18  Height:      Weight:      SpO2: 92% 93% 93% 93%   Gen : pt was lying in bed in NAD. He is calm and able to contribute to the hx HEENT : symmetric, no facial droop, speech clear HRRR, systolic murmur LCTAB ABD benign Ext no edema, strength 5/5 Pysch : A&O, immediate recall 3/3, 5 min recall 2/3.   Labs and imaging reviewed  Assessment and Plan: I agree with the formulated Assessment and Plan with the following changes:   1. Falls - pt states his legs give out but on both Dr Donnelly Stager and my exam, his strength is nl. CT of head showed no acute changes but there was extensive ischemic disease. No infxn has been found to explain his weakness. PT has eval pt and rec 24 hour assist at home or SNF.   2. Altered behavior - there is discrepancy btw his MMSE per Dr Tonny Branch and the hx from the wife. CT would indicate a vascular cause for dementia but MMSE is too high to suggest dementia. We have asked the wife to remove all guns from the home in case pt does return home. We have requested pysch to see pt. We will need to speak further to the wife about her wishes regarding the pt's dispo plans. Currently, the  pt appears to be competent to make decisions but will defer to pysch's opinion. Will investigate reversible causes of dementia.   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    Burns Spain, MD 7/23/20133:20 PM

## 2012-05-06 NOTE — Progress Notes (Signed)
Subjective:    Interval Events:  Mr. Disney is feeling well this morning.  His only complaint is some mild pain in his left ribs from a recent fall, but this pain is improving.  He denies chest pain, headache, dyspnea, abdominal pain, dysuria and trouble with bowel movements.  His last BM, however, was 4 days ago.  He also says at [facility] a few days ago they tried to place a urinary catheter and since then his urine has been bloody.  He endorses recent weakness in his legs and some falls, but says he's not entirely sure why he is in the hospital.  He denies hearing voices, but does say he sometimes hears noises on his porch outside.  I couldn't elicit any HI, SI, or paranoia this morning.  Still, patient was okay with psychiatry seeing him today.\  Patient wants MD to call wife and is okay talking with her about all his medical issues.    Objective:    Vital Signs:   Temp:  [98 F (36.7 C)-99.4 F (37.4 C)] 99.4 F (37.4 C) (07/23 0500) Pulse Rate:  [59-95] 59  (07/23 0500) Resp:  [18-20] 20  (07/23 0500) BP: (164-202)/(60-138) 176/87 mmHg (07/23 0500) SpO2:  [90 %-96 %] 96 % (07/23 0500) Weight:  [227 lb 1.2 oz (103 kg)-227 lb 8.2 oz (103.2 kg)] 227 lb 1.2 oz (103 kg) (07/23 0500) Last BM Date: 05/05/12   24-hour weight change: Weight change: 1 lb 5.2 oz (0.6 kg)   Intake/Output:   Intake/Output Summary (Last 24 hours) at 05/06/12 0907 Last data filed at 05/05/12 1500  Gross per 24 hour  Intake    360 ml  Output    400 ml  Net    -40 ml       Physical Exam: General appearance: alert, cooperative and no distress Resp: clear to auscultation bilaterally Chest wall: left sided costochondral tenderness Cardio: regular rate and rhythm, S1, S2 normal and systolic murmur: systolic 2/6,   GI: soft, non-tender; bowel sounds normal; no masses,  no organomegaly Extremities: edema 1+    Labs: Basic Metabolic Panel:  Lab 05/05/12 0981 05/05/12 1017 05/04/12 1945  NA 146*  -- 147*  K 3.9 -- 3.5  CL 104 -- 107  CO2 36* -- 32  GLUCOSE 202* -- 95  BUN 25* -- 18  CREATININE 1.36* -- 1.09  CALCIUM 8.6 -- 9.0  MG -- 2.1 --  PHOS -- -- --   Liver Function Tests:  Lab 05/04/12 1945  AST 40*  ALT 42  ALKPHOS 54  BILITOT 0.8  PROT 6.3  ALBUMIN 3.0*   CBC:  Lab 05/04/12 1945  WBC 7.8  NEUTROABS 4.7  HGB 12.9*  HCT 41.7  MCV 84.2  PLT 147*   Cardiac Enzymes:  Lab 05/04/12 1945  CKTOTAL --  CKMB --  CKMBINDEX --  TROPONINI <0.30   CBG:  Lab 05/06/12 0712 05/05/12 2015 05/05/12 1706 05/05/12 1118 05/05/12 0736  GLUCAP 107* 186* 187* 117* 79   Imaging: Dg Chest 2 View 05/05/2012 IMPRESSION: Low volumes with bibasilar atelectasis.  Ct Head Wo Contrast 05/05/2012 IMPRESSION:  1.  No clear acute intracranial findings. 2.  Subcortical white matter hypodensity in the left frontal lobe is age indeterminate. 3.  Extensive microvascular disease and atrophy. 4.  Prior craniotomy and aneurysm clipping.   Mr Brain Wo Contrast 05/05/2012 IMPRESSION:  1.  No acute intracranial abnormality. 2.  Lacunar infarct of the left corona radiata is new  since the prior study, but is not acute. 3.  Atrophy and extensive white matter disease.  This likely reflects the sequelae of chronic microvascular ischemia. 4.  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     Medications:    Infusions:    . dextrose 100 mL/hr at 05/06/12 5621     Scheduled Medications:    . amLODipine  10 mg Oral Daily  . atorvastatin  20 mg Oral q1800  . carvedilol  25 mg Oral Daily  . clopidogrel  75 mg Oral Q breakfast  . docusate sodium  100 mg Oral BID  . furosemide  80 mg Oral Daily  . heparin  5,000 Units Subcutaneous Q8H  . insulin glargine  20 Units Subcutaneous QHS  . lisinopril  20 mg Oral Daily  . sodium chloride  3 mL Intravenous Q12H  . spironolactone  25 mg Oral Daily  . DISCONTD: insulin glargine  35 Units  Subcutaneous Daily  . DISCONTD: insulin glargine  35 Units Subcutaneous QHS  . DISCONTD: simvastatin  40 mg Oral q1800     PRN Medications: acetaminophen, acetaminophen, ondansetron (ZOFRAN) IV, ondansetron, traZODone   Assessment/ Plan:     1. Generalized weakness: Mr. Stein is a 71 year old man with the PMH listed above who presents with generalized weakness and several falls in the last few days. He states that he feels like his knees are going to buckle and then he either falls or sits down. He has no concerning signs for acute neurological problems including stroke or seizure on exam today. He has some hypernatremia which can cause weakness but no other electrolyte abnormalities to explain this. His wife states that his physical activity has been decreased for sometime with him spending a lot of time in his room watching TV and only getting up to go to the bathroom. He has several mediations that could cause him to be weak and unsteady including Neurontin, Depakote, Coreg, and Celexa. Physical exam today showed symmetric strength. His weakness is likely multifactorial.  - Maintain on telemetry  - Continue D5 saline at 100 ml/hr, recheck BMET this afternoon - Encourage oral intake  - PT and OT to evaluate - Hold sedating medications:  Depakote, gabapentin, Flexeril, citalopram, terazosin  2. Altered mental status concerning for dementia: Per the wife's report and after reviewing the last few clinic notes I have concern for dementia in this patient. He has had emotional irritability, day/night disturbance, behavioral changes, memory problems, and paranoia. He has a history of CVA and his last MRI done in 2011 showed extensive chronic small vessel changes throughout the brain. On his head CT scan today he has a new area that could be an old infarct but they could not rule out metastasis. His MMSE is not that markedly changed but his clock drawing was markedly abnormal which may indicate  further need for neuropsychiatric testing. Other differential diagnosis includes repeated hypoglycemia episodes because of decreased oral intake and continued lantus usage. He has had his insulin regimen scaled back over the last year because of recurrent hypoglycemia.  MRI shows atrophy and extensive white matter disease consistent with microvascular ischemia and a new but non-acute lacunar infarct. - Check UA,UCx for possible infectious cause  - Correct his hypernatremia  - Check orthostatic vital signs  - Hold his sedating medications as above - HIV and RPR today - Consulting psychiatry - Starting daily ASA therapy, 81mg   3. Hypernatremia: His sodium  on admission today is 147. Calculated free water deficit is about 2.5L. Differential diagnosis includes medication effect from Celexa and depakote, decreased oral intake secondary to possible dementia, diuretic overuse, or osmotic diuresis from uncontrolled hyperglycemia. His A1C has been steadily dropping over the last few checks and he was hypoglycemic today which makes it less likely that he has uncontrolled hyperglycemia. Diuretic overuse would likely cause both a loss of both sodium as well as water.  - D5 at 100 ml/hr to replace free water  - Encourage free water intake   4. Diabetes Mellitus Type II: His A1C has been steadily dropping over the last few checks and he was markedly hypoglycemic on admission today. This coupled with this hypernatremia raise the concern for decreased oral intake or medication overuse. We will watch his calorie intake and decrease his Lantus to 20 units daily to try to avoid hypoglycemia. - Continue Lantus 20U qHS - CBGs < 200  5. VTE: SCDs  6. Dispo:  Pending resolution of hypernatremia and evaluation for AMS.   Length of Stay: 1 days   Signed by:  Dorthula Rue. Earlene Plater, MD PGY-I, Internal Medicine Pager [phone]  05/06/2012, 9:07 AM

## 2012-05-06 NOTE — Evaluation (Signed)
Physical Therapy Evaluation Patient Details Name: [name] MRN: [id number] DOB: [birth year] Today's Date: 05/06/2012 Time: 7829-5621 PT Time Calculation (min): 26 min  PT Assessment / Plan / Recommendation Clinical Impression  pt presents with AMS.  pt moving well with only mild instability when using RW.  pt would benefit from HHPT with 24hr S if wife able to provide at home.  pt notes wife works, but unsure amount of time.  If wife not able to care for pt at home, then may need to consider SNF.      PT Assessment  Patient needs continued PT services    Follow Up Recommendations  Home health PT;Supervision/Assistance - 24 hour    Barriers to Discharge None      Equipment Recommendations  None recommended by PT    Recommendations for Other Services     Frequency Min 3X/week    Precautions / Restrictions Precautions Precautions: Fall Restrictions Weight Bearing Restrictions: No   Pertinent Vitals/Pain Denies pain.        Mobility  Bed Mobility Bed Mobility: Supine to Sit;Sitting - Scoot to Edge of Bed Supine to Sit: 6: Modified independent (Device/Increase time);With rails Sitting - Scoot to Edge of Bed: 6: Modified independent (Device/Increase time) Details for Bed Mobility Assistance: pt required increased time Transfers Transfers: Sit to Stand;Stand to Sit Sit to Stand: 5: Supervision;With upper extremity assist;From bed Stand to Sit: 5: Supervision;With upper extremity assist;To chair/3-in-1 Details for Transfer Assistance: cues for use of UEs and to get closer to chair prior to sitting.   Ambulation/Gait Ambulation/Gait Assistance: 4: Min guard Ambulation Distance (Feet): 150 Feet Assistive device: Rolling walker Ambulation/Gait Assistance Details: cues for positioning in RW, use of RW.   Gait Pattern: Step-through pattern;Decreased stride length Stairs: No Wheelchair Mobility Wheelchair Mobility: No    Exercises     PT Diagnosis: Difficulty walking   PT Problem List: Decreased activity tolerance;Decreased balance;Decreased mobility;Decreased knowledge of use of DME;Decreased cognition PT Treatment Interventions: DME instruction;Gait training;Stair training;Functional mobility training;Therapeutic activities;Therapeutic exercise;Balance training;Patient/family education   PT Goals Acute Rehab PT Goals PT Goal Formulation: With patient Time For Goal Achievement: 05/13/12 Potential to Achieve Goals: Good Pt will go Supine/Side to Sit: Independently PT Goal: Supine/Side to Sit - Progress: Goal set today Pt will go Sit to Supine/Side: Independently PT Goal: Sit to Supine/Side - Progress: Goal set today Pt will go Sit to Stand: with modified independence PT Goal: Sit to Stand - Progress: Goal set today Pt will go Stand to Sit: with modified independence PT Goal: Stand to Sit - Progress: Goal set today Pt will Ambulate: >150 feet;with modified independence;with rolling walker PT Goal: Ambulate - Progress: Goal set today Pt will Go Up / Down Stairs: 3-5 stairs;with supervision;with rail(s) PT Goal: Up/Down Stairs - Progress: Goal set today  Visit Information  Last PT Received On: 05/06/12 Assistance Needed: +1 PT/OT Co-Evaluation/Treatment: Yes    Subjective Data  Subjective: Sometimes my wife helps me, but sometimes I do things for myself.   Patient Stated Goal: Home   Prior Functioning  Home Living Lives With: Spouse Available Help at Discharge: Family;Available PRN/intermittently (wife works part time, daughter lives near) Type of Home: House Home Access: Stairs to enter Secretary/administrator of Steps: 3 Entrance Stairs-Rails: Right Home Layout: One level Bathroom Shower/Tub: Forensic scientist: Standard Bathroom Accessibility: Yes How Accessible: Accessible via walker Home Adaptive Equipment: Wheelchair - manual;Walker - rolling;Straight cane;Shower chair with back Prior Function Level of  Independence: Independent  with assistive device(s) (cane) Able to Take Stairs?: Yes Driving: Yes Communication Communication: No difficulties Dominant Hand: Right    Cognition  Overall Cognitive Status: Appears within functional limits for tasks assessed/performed Arousal/Alertness: Awake/alert Orientation Level: Appears intact for tasks assessed Behavior During Session: [facility] for tasks performed Cognition - Other Comments: pt oriented and cognitively functional during therapy.      Extremity/Trunk Assessment Right Lower Extremity Assessment RLE ROM/Strength/Tone: Within functional levels RLE Sensation: WFL - Light Touch Left Lower Extremity Assessment LLE ROM/Strength/Tone: Within functional levels LLE Sensation: WFL - Light Touch Trunk Assessment Trunk Assessment: Normal   Balance Balance Balance Assessed: Yes Static Standing Balance Static Standing - Balance Support: No upper extremity supported;During functional activity Static Standing - Level of Assistance: 5: Stand by assistance Static Standing - Comment/# of Minutes: pt stood at sink for face and hand washing.    End of Session PT - End of Session Equipment Utilized During Treatment: Gait belt Activity Tolerance: Patient tolerated treatment well Patient left: in chair;with call bell/phone within reach;with chair alarm set Nurse Communication: Mobility status  GP     Sunny Schlein, [place] [phone] 05/06/2012, 10:47 AM

## 2012-05-06 NOTE — Consult Note (Signed)
Patient Identification:  [name] Date of Evaluation:  05/06/2012 Reason for Consult: Altered Mental Status, Psychosis?, Homicidal ideation threats  Referring Provider:  Dr. Earlene Plater  History of Present Illness: It is reported that the patient has threatened to shoot his wife and has been very paranoid about someone being in her house. This patient is found sitting in a chair alert oriented to person, place situation and his various medical conditions. He is calm and attentive. He speaks clearly with organized logical and sequential thought. He describes his home situation: He lives with his wife has 3 grown children 9 grandchildren and one great-grandchild. He says he loves them all "to death. When asked about his threats to his wife he states that he loves her and would never do anything to harm her. He states that he has told her this over and over. As far as somebody be in a place he says that the neighbor has a Rottweiler dog, very friendly, who roams at night and sometimes causes for small pail in the back porch to rattle. The actual reason why he is in  the hospital where is due the fact his wife found him sitting in the laundry room. He states he simply felt his needs were giving way and rather than fall, he's leaned against the wall infarct with to a sitting position on the floor.  Past Psychiatric History: This patient denies any past history of psychiatric care.   Past Medical History:     Past Medical History  Diagnosis Date  . Stroke     left thalamic infarct  . Diabetes mellitus   . Hypertension   . 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 TIA (transient ischemic attack)     multiple in past, most recently in 2011  . Sleep apnea        Past Surgical History  Procedure Date  . Knee arthroscopy     lt knee  . Shoulder arthroscopy w/ rotator cuff repair     rt  shoulder  . Intracranial aneurysm repair     Allergies:  Allergies  Allergen Reactions  . Pioglitazone Other (See Comments)    Edema   . Rosiglitazone Maleate Other (See Comments)    edema    Current Medications:  Prior to Admission medications   Medication Sig Start Date End Date Taking? Authorizing Provider  amLODipine (NORVASC) 10 MG tablet Take 10 mg by mouth daily.   Yes Historical Provider, MD  carvedilol (COREG) 12.5 MG tablet Take 12.5 mg by mouth 2 (two) times daily with a meal.   Yes Historical Provider, MD  clopidogrel (PLAVIX) 75 MG tablet Take 75 mg by mouth daily.   Yes Historical Provider, MD  cyclobenzaprine (FLEXERIL) 5 MG tablet Take 5 mg by mouth 3 (three) times daily as needed. For muscle spasms   Yes Historical Provider, MD  divalproex (DEPAKOTE ER) 500 MG 24 hr tablet Take 1,000 mg by mouth daily.    Yes Historical Provider, MD  eplerenone (INSPRA) 50 MG tablet Take 50 mg by mouth 2 (two) times daily.   Yes Historical Provider, MD  furosemide (LASIX) 80 MG tablet Take 80 mg by mouth daily.   Yes Historical Provider, MD  gabapentin (NEURONTIN) 600 MG tablet Take 600 mg by mouth 3 (three) times daily.   Yes Historical Provider, MD  insulin glargine (LANTUS SOLOSTAR) 100 UNIT/ML  injection Inject 35 Units into the skin at bedtime. 02/21/12 02/20/13 Yes Melida Quitter, MD  lisinopril (PRINIVIL,ZESTRIL) 20 MG tablet TAKE ONE TABLET BY MOUTH EVERY DAY 03/16/12  Yes Burns Spain, MD  simvastatin (ZOCOR) 40 MG tablet TAKE ONE TABLET BY MOUTH EVERY DAY AT BEDTIME 03/16/12  Yes Burns Spain, MD  terazosin (HYTRIN) 5 MG capsule Take 5 mg by mouth at bedtime.   Yes Historical Provider, MD  citalopram (CELEXA) 10 MG tablet Take 20 mg by mouth Daily. Take 2 tablets daily for mood. 02/11/12   Historical Provider, MD    Social History:    reports that he has quit smoking. He does not have any smokeless tobacco history on file. He reports that he does not drink alcohol or use  illicit drugs.   Family History:    History reviewed. No pertinent family history.  Mental Status Examination/Evaluation: Objective:  Appearance: Fairly Groomed  Psychomotor Activity:  Normal  Eye Contact::  Good  Speech:  Clear and Coherent and informative  Volume:  Normal  Mood:  Euthymic  Affect:  Congruent  Thought Process:  Coherent, Relevant and Intact  Orientation:  Full  Thought Content: denies any paranoia, HI/SI  Suicidal Thoughts:  No  Homicidal Thoughts:  No  Judgement:  Fair  Insight:  Fair    DIAGNOSIS:   AXIS I   R.O early dementia,+ MRI  AXIS II  Deffered  AXIS III See medical notes.  AXIS IV economic problems, other psychosocial or environmental problems and problems with access to health care services  AXIS V 61-70 mild symptoms   Assessment/Plan:  Discussed with Dr. Earlene Plater  Pt evaluated at 2pm 05/06/12 This patient says has worked with a Systems analyst all of the nation. He has traveled to many states that he only stays 2-3 days at most. He is currently retired. His wife is working a few hours for a retired individual. He has called his children see above, living within him about a mile radius of his home. He lost them all and the lights and seeing his one great-granddaughter. He says that he and his father used to go hunting or habits are squirrels. He currently has a 12-gauge, 20-gauge and a 410-gauge shotgun. With respect to the reported threat to his wife, he states that she leaves home about 4 in the afternoon and returns about 10 PM. He told her that if he ever finds out that she is with a man his gun and shoot the man he never shoot her and he's told her that many times. When asked about the suspicion that people were in the house he said the Rottweiler who is the neighbor's dog roams around at night and he goes to see if the dog is bothering anything.     When discussing this patient with Dr. Earlene Plater, he said his team member  conducted a miniy mental status examination with this patient and the patient did reasonably well. He was unable to draw the clock correctly. This in combination with the MRI report of the evidence of white matter microvascular ischemia suggests the subtle evidence of some dementia which can be cognitively compensated such as this patient's rational explanation for what appeared to his wife as paranoia and suspicion. He denies to homicidal ideation but it is agreed that the wife should remove all guns and ammunition from the home. In the state of delusional paranoid dementia there is potential an individual could become homicidal.  This patient was easily engaged in conversation. He gives a longitudinal and situational discussion about his life that was organized logical and sequential.  He was slightly confused about the date but was able to give the year 2013. When he was asked to calculate serial sevens, he was accurate for 3 answers and incorrect on the following 3 based upon the initial error. He denies auditory or visual hallucinations. He denies suicidal or homicidal thinking.    At no time during this conversation did he evidence any symptoms of psychotic behavior or thought. If he was paranoid at home, it is likely that he is trying to conceal his suspicions or his forgetfulness during this evaluation. This is not uncommon with early dementia. RECOMMENDATION: 1.  This patient has capacity to make decisions regarding his health care. He relies on his wife to provide his medication each day to make sure that he has it. 2.   Suggest Namenda 5 mg twice daily to slow but not correct any memory loss.  3.   PT and OT know appreciated 4.   this patient will benefit from PT rehabilitation at SNF if insurance arrangements can be made this will be explored in detail 5.   No further psychiatric needs identified unless requested. Deidrick Rainey J. Ferol Luz, MD Psychiatrist  05/07/2012 12:04 AM

## 2012-05-07 LAB — BASIC METABOLIC PANEL
CO2: 33 mEq/L — ABNORMAL HIGH (ref 19–32)
Calcium: 9.4 mg/dL (ref 8.4–10.5)
Chloride: 103 mEq/L (ref 96–112)
Glucose, Bld: 155 mg/dL — ABNORMAL HIGH (ref 70–99)
Sodium: 144 mEq/L (ref 135–145)

## 2012-05-07 LAB — GLUCOSE, CAPILLARY
Glucose-Capillary: 132 mg/dL — ABNORMAL HIGH (ref 70–99)
Glucose-Capillary: 156 mg/dL — ABNORMAL HIGH (ref 70–99)
Glucose-Capillary: 155 mg/dL — ABNORMAL HIGH (ref 70–99)
Glucose-Capillary: 151 mg/dL — ABNORMAL HIGH (ref 70–99)

## 2012-05-07 LAB — URINE CULTURE: Colony Count: 3000

## 2012-05-07 MED ORDER — TERAZOSIN HCL 5 MG PO CAPS
5.0000 mg | ORAL_CAPSULE | Freq: Every day | ORAL | Status: DC
  Administered 2012-05-07 – 2012-05-09 (×3): 5 mg via ORAL
  Filled 2012-05-07 (×4): qty 1

## 2012-05-07 MED ORDER — CARVEDILOL 25 MG PO TABS
25.0000 mg | ORAL_TABLET | Freq: Two times a day (BID) | ORAL | Status: DC
  Administered 2012-05-07 – 2012-05-10 (×6): 25 mg via ORAL
  Filled 2012-05-07 (×8): qty 1

## 2012-05-07 MED ORDER — CITALOPRAM HYDROBROMIDE 10 MG PO TABS
10.0000 mg | ORAL_TABLET | Freq: Every day | ORAL | Status: DC
  Administered 2012-05-07 – 2012-05-10 (×4): 10 mg via ORAL
  Filled 2012-05-07 (×4): qty 1

## 2012-05-07 NOTE — Progress Notes (Signed)
Received patient via wh/ch.  Oriented to room, bed alarm on.  States wants to sit up to eat.  Sitting up eatting dinner tolerating well.

## 2012-05-07 NOTE — Progress Notes (Signed)
Physical Therapy Treatment Patient Details Name: [name] MRN: [id number] DOB: 02-23-1941 Today's Date: 05/07/2012 Time: 4098-1191 PT Time Calculation (min): 25 min  PT Assessment / Plan / Recommendation Comments on Treatment Session  pt presents with confusion.  pt moving well and increasing activity tolerance.  pt had been trying to get up on his own to go to bathroom when PT arrived.      Follow Up Recommendations  Home health PT;Supervision/Assistance - 24 hour    Barriers to Discharge        Equipment Recommendations  None recommended by PT    Recommendations for Other Services    Frequency Min 3X/week   Plan Discharge plan remains appropriate;Frequency remains appropriate    Precautions / Restrictions Precautions Precautions: Fall Restrictions Weight Bearing Restrictions: No   Pertinent Vitals/Pain Denies pain.      Mobility  Bed Mobility Bed Mobility: Not assessed Transfers Transfers: Sit to Stand;Stand to Sit Sit to Stand: 5: Supervision;With upper extremity assist;From chair/3-in-1 Stand to Sit: 5: Supervision;With upper extremity assist;To chair/3-in-1 Details for Transfer Assistance: demos good use of technique Ambulation/Gait Ambulation/Gait Assistance: 4: Min guard Ambulation Distance (Feet): 200 Feet Assistive device: Rolling walker Ambulation/Gait Assistance Details: cues for RW safety with turns.   Gait Pattern: Step-through pattern;Decreased stride length Stairs: No Wheelchair Mobility Wheelchair Mobility: No    Exercises     PT Diagnosis:    PT Problem List:   PT Treatment Interventions:     PT Goals Acute Rehab PT Goals Time For Goal Achievement: 05/13/12 PT Goal: Sit to Stand - Progress: Progressing toward goal PT Goal: Stand to Sit - Progress: Progressing toward goal PT Goal: Ambulate - Progress: Progressing toward goal  Visit Information  Last PT Received On: 05/07/12 Assistance Needed: +1 PT/OT Co-Evaluation/Treatment:  Yes    Subjective Data  Subjective: I was getting up to go to the bathroom.     Cognition  Overall Cognitive Status: Appears within functional limits for tasks assessed/performed Arousal/Alertness: Awake/alert Orientation Level: Appears intact for tasks assessed Behavior During Session: [facility] for tasks performed Cognition - Other Comments: pt  functioning well during therapy, but had been trying to get up on his own to go to bathroom without calling for A.      Balance  Balance Balance Assessed: Yes Static Standing Balance Static Standing - Balance Support: No upper extremity supported;During functional activity Static Standing - Level of Assistance: 5: Stand by assistance Static Standing - Comment/# of Minutes: pt able to stand to urinate at toilet.    End of Session PT - End of Session Equipment Utilized During Treatment: Gait belt Activity Tolerance: Patient tolerated treatment well Patient left: in chair;with call bell/phone within reach;with chair alarm set Nurse Communication: Mobility status   GP     Sunny Schlein, [place] [phone] 05/07/2012, 11:55 AM

## 2012-05-07 NOTE — Progress Notes (Signed)
Clinical Social Work Department BRIEF PSYCHOSOCIAL ASSESSMENT 05/07/2012  Patient:  [name], [name]     Account Number:  [id number]     Admit date:  05/04/2012  Clinical Social Worker:  Conley Simmonds  Date/Time:  05/07/2012 12:00 M  Referred by:  Physician  Date Referred:  05/07/2012 Referred for  SNF Placement   Other Referral:   Interview type:  Patient Other interview type:    PSYCHOSOCIAL DATA Living Status:  WIFE Admitted from facility:   Level of care:   Primary support name:  Denton Brick Primary support relationship to patient:  SPOUSE Degree of support available:    CURRENT CONCERNS Current Concerns  Post-Acute Placement   Other Concerns:    SOCIAL WORK ASSESSMENT / PLAN CSW met with pt at bedside to discuss d/c planning-Pt is agreeable to SNF at d/c-  Pt lives very close to [facility] care and this is first choice-  CSW contacted pt wife who works 3 jobs and she is in agreement-  CSW initiating bed search and will facilitate d/c planning   Assessment/plan status:  Psychosocial Support/Ongoing Assessment of Needs Other assessment/ plan:   Information/referral to community resources:   [employer]    PATIENT'S/FAMILY'S RESPONSE TO PLAN OF CARE: Pt agreeable to SNF-he is familiar with [facility] care and this is first choice-pt and family will also review SNF list and come up with other options as a back up (in the event that [facility] does not have a spot)  [employer], [phone]

## 2012-05-07 NOTE — Clinical Social Work Placement (Addendum)
    Clinical Social Work Department CLINICAL SOCIAL WORK PLACEMENT NOTE 05/07/2012  Patient:  [name], [name]  Account Number:  [id number] Admit date:  05/04/2012  Clinical Social Worker:  Margaree Mackintosh  Date/time:  05/07/2012 03:08 PM  Clinical Social Work is seeking post-discharge placement for this patient at the following level of care:   SKILLED NURSING   (*CSW will update this form in Epic as items are completed)   05/07/2012  Patient/family provided with [facility] Department of Clinical Social Work's list of facilities offering this level of care within the geographic area requested by the patient (or if unable, by the patient's family).  05/07/2012  Patient/family informed of their freedom to choose among providers that offer the needed level of care, that participate in Medicare, Medicaid or managed care program needed by the patient, have an available bed and are willing to accept the patient.  05/07/2012  Patient/family informed of [facility]' ownership interest in [facility], as well as of the fact that they are under no obligation to receive care at this facility.  PASARR submitted to EDS on 05/07/2012 PASARR number received from EDS on 05/10/2012  FL2 transmitted to all facilities in geographic area requested by pt/family on  05/07/2012 FL2 transmitted to all facilities within larger geographic area on   Patient informed that his/her managed care company has contracts with or will negotiate with  certain facilities, including the following:     Patient/family informed of bed offers received:  05/09/2012 Patient chooses bed at [facility] Physician recommends and patient chooses bed at  [facility]  Patient to be transferred to  on  05/10/2012 (JB) Patient to be transferred to facility by FAMILY Dorma Russell)  The following physician request were entered in Epic:   Additional Comments:

## 2012-05-07 NOTE — Progress Notes (Signed)
Internal Medicine Teaching Service Attending Note Date: 05/07/2012  Patient name: [name]  Medical record number: [id number]  Date of birth: [birth year]    This patient has been seen and discussed with the house staff. Please see their note for complete details. I concur with their findings with the following additions/corrections: Mr Whitehorn has no complaints today. He is willing to go to SNF prior to going home.   BUTCHER,ELIZABETH 05/07/2012, 12:13 PM

## 2012-05-07 NOTE — Progress Notes (Signed)
Occupational Therapy Treatment Patient Details Name: [name] MRN: [id number] DOB: [birth year] Today's Date: 05/07/2012 Time: 1100-1113 OT Time Calculation (min): 13 min  OT Assessment / Plan / Recommendation Comments on Treatment Session PT progressing with OT session- pt with cognitive deficits demonstrated with poor recall to call staff for (A) with mobility    Follow Up Recommendations  Skilled nursing facility (if family could provide 24 / 7 HHOT unknown at this time)    Barriers to Discharge       Equipment Recommendations  None recommended by PT    Recommendations for Other Services    Frequency Min 2X/week   Plan Discharge plan remains appropriate    Precautions / Restrictions Precautions Precautions: Fall Restrictions Weight Bearing Restrictions: No   Pertinent Vitals/Pain None reported    ADL  Grooming: Performed;Wash/dry hands;Set up (pt using peri care product and states its a decreaser) Where Assessed - Grooming: Supported standing Toilet Transfer: Performed;Min Pension scheme manager Method: Sit to Barista: Regular height toilet (standing over commode with (A) for clothing) Toileting - Clothing Manipulation and Hygiene: Performed;Minimal assistance Where Assessed - Engineer, mining and Hygiene: Standing Equipment Used: Rolling walker Transfers/Ambulation Related to ADLs: ~ 8 ft to restroom min guard (A) ADL Comments: Pt with chair alarm sounding on arrival. pt needing to void in urinal and attempting independent sit<>stand. Pt (A)ed to restroom with (A) to manage gown. Pt recalling room number as "12". Pt requesting paper towel (A) due to dynamic standing balance required.       OT Goals Acute Rehab OT Goals Time For Goal Achievement: 05/20/12 Potential to Achieve Goals: Good ADL Goals Pt Will Perform Grooming: Standing at sink;with modified independence ADL Goal: Grooming - Progress: Progressing toward  goals Pt Will Perform Upper Body Bathing: with modified independence;Sitting at sink Pt Will Perform Lower Body Bathing: with modified independence;Sit to stand from chair Pt Will Perform Upper Body Dressing: with modified independence;Sitting, chair;Sitting, bed Pt Will Perform Lower Body Dressing: Sit to stand from bed;Sit to stand from chair;with modified independence Pt Will Transfer to Toilet: with modified independence;Ambulation;Regular height toilet ADL Goal: Toilet Transfer - Progress: Progressing toward goals Pt Will Perform Toileting - Clothing Manipulation: with modified independence;Standing ADL Goal: Toileting - Clothing Manipulation - Progress: Progressing toward goals Pt Will Perform Toileting - Hygiene: with modified independence;Sit to stand from 3-in-1/toilet ADL Goal: Toileting - Hygiene - Progress: Progressing toward goals Pt Will Perform Tub/Shower Transfer: Tub transfer;with modified independence;Ambulation  Visit Information  Last OT Received On: 05/07/12 Assistance Needed: +1    Subjective Data      Prior Functioning       Cognition  Overall Cognitive Status: Appears within functional limits for tasks assessed/performed Arousal/Alertness: Awake/alert Orientation Level: Appears intact for tasks assessed Behavior During Session: [facility] for tasks performed Cognition - Other Comments: pt  functioning well during therapy, but had been trying to get up on his own to go to bathroom without calling for A.      Mobility Bed Mobility Bed Mobility: Not assessed Transfers Sit to Stand: 5: Supervision;With upper extremity assist;From chair/3-in-1 Stand to Sit: 5: Supervision;With upper extremity assist;To chair/3-in-1 Details for Transfer Assistance: demos good use of technique   Exercises    Balance Balance Balance Assessed: Yes Static Standing Balance Static Standing - Balance Support: No upper extremity supported;During functional activity Static Standing - Level  of Assistance: 5: Stand by assistance Static Standing - Comment/# of Minutes: pt able to stand  to urinate at toilet.    End of Session OT - End of Session Activity Tolerance: Other (comment) (with PT)  GO     Harrel [facility] 05/07/2012, 1:57 PM Pager: [phone]

## 2012-05-07 NOTE — Progress Notes (Signed)
Subjective:    Interval Events:  [name] feels good this morning.  He is ambulating well, without pain or weakness.  He denies having chest pain, dyspnea, and abdominal pain.  The hematuria is decreasing each day since the traumatic catheterization attempt.  I spoke with his wife this morning about a number of his medications.  He started taking depakote and citalopram relatively recently for his mood and anger problems.  She says they have worked in improving these.  He has been taking gabapentin for years now for neuropathy and has never had trouble with this medication.  He takes Flexeril everyday for "tightness" in his arms and legs.    Objective:    Vital Signs:   Temp:  [98 F (36.7 C)-99 F (37.2 C)] 99 F (37.2 C) (07/24 0500) Pulse Rate:  [57-94] 69  (07/24 0500) Resp:  [18] 18  (07/23 2100) BP: (142-204)/(46-81) 204/80 mmHg (07/24 0500) SpO2:  [92 %-93 %] 93 % (07/24 0500) Weight:  [222 lb (100.699 kg)] 222 lb (100.699 kg) (07/24 0500) Last BM Date: 05/06/12   24-hour weight change: Weight change: -5 lb 8.2 oz (-2.502 kg)  Filed Weights   05/05/12 1800 05/06/12 0500 05/07/12 0500  Weight: 227 lb 8.2 oz (103.2 kg) 227 lb 1.2 oz (103 kg) 222 lb (100.699 kg)    Intake/Output:   Intake/Output Summary (Last 24 hours) at 05/07/12 1610 Last data filed at 05/07/12 0600  Gross per 24 hour  Intake      0 ml  Output    850 ml  Net   -850 ml      Physical Exam: General appearance: alert, cooperative and no distress  Resp: clear to auscultation bilaterally  Chest wall: left sided costochondral tenderness  Cardio: regular rate and rhythm, S1, S2 normal and systolic murmur: systolic 2/6,  GI: soft, non-tender; bowel sounds normal; no masses, no organomegaly  Extremities: edema 1+    Labs: Basic Metabolic Panel:  Lab 05/07/12 9604 05/06/12 0746 05/05/12 2011 05/05/12 1017 05/04/12 1945  NA 144 147* 146* -- 147*  K 3.8 3.9 3.9 -- 3.5  CL 103 105 104 -- 107    CO2 33* 37* 36* -- 32  GLUCOSE 155* 124* 202* -- 95  BUN 19 22 25* -- 18  CREATININE 1.11 1.24 1.36* -- 1.09  CALCIUM 9.4 8.9 8.6 -- --  MG -- -- -- 2.1 --  PHOS -- -- -- -- --   CBG:  Lab 05/07/12 1136 05/07/12 0734 05/06/12 2003 05/06/12 1653 05/06/12 1115  GLUCAP 156* 132* 229* 159* 191*   Microbiology: Results for orders placed during the hospital encounter of 05/05/12  URINE CULTURE     Status: Normal   Collection Time   05/06/12 10:21 AM      Component Value Range Status Comment   Specimen Description URINE, CLEAN CATCH   Final    Special Requests NONE   Final    Culture  Setup Time 05/06/2012 11:17   Final    Colony Count 3,000 COLONIES/ML   Final    Culture INSIGNIFICANT GROWTH   Final    Report Status 05/07/2012 FINAL   Final      Medications:    Infusions:    . DISCONTD: dextrose 100 mL/hr at 05/06/12 5409     Scheduled Medications:    . amLODipine  10 mg Oral Daily  . atorvastatin  20 mg Oral q1800  . carvedilol  25 mg Oral BID WC  .  clopidogrel  75 mg Oral Q breakfast  . furosemide  80 mg Oral Daily  . heparin  5,000 Units Subcutaneous Q8H  . insulin aspart  0-9 Units Subcutaneous TID WC  . insulin glargine  20 Units Subcutaneous QHS  . lisinopril  20 mg Oral Daily  . senna-docusate  1 tablet Oral BID  . sodium chloride  3 mL Intravenous Q12H  . spironolactone  25 mg Oral Daily  . DISCONTD: aspirin EC  81 mg Oral Daily  . DISCONTD: carvedilol  25 mg Oral Daily  . DISCONTD: docusate sodium  100 mg Oral BID     PRN Medications: acetaminophen, acetaminophen, bisacodyl, docusate sodium, ondansetron (ZOFRAN) IV, ondansetron, traZODone   Assessment/ Plan:    1.   Generalized weakness:  [name] is a 71 year old man with the PMH listed above who presents with generalized weakness and several falls in the last few days. He states that he feels like his knees are going to buckle and then he either falls or sits down. He has no concerning signs  for acute neurological problems including stroke or seizure on exam today. He has some hypernatremia which can cause weakness but no other electrolyte abnormalities to explain this. His wife states that his physical activity has been decreased for sometime with him spending a lot of time in his room watching TV and only getting up to go to the bathroom. He has several mediations that could cause him to be weak and unsteady including Neurontin, Depakote, Coreg, and Celexa. Physical exam today showed symmetric strength. Deconditioning may be the root cause of his weakness. - Maintain on telemetry  - Continue D5 saline at 100 ml/hr - Encourage oral intake  - PT and OT recommend 24-hour supervision vs. SNF - Hold sedating medications: Depakote, gabapentin, Flexeril, citalopram, terazosin.  We will likely restart all of these except for the Flexeril at discharge.  Would like to stop Depakote as well.  It seems like his gait instability/weakness time course corresponds with when he started this medication.  2.   Altered mental status concerning for dementia:  Per the wife's report, and after reviewing the last few clinic notes, there is concern for dementia in this patient. He has had emotional irritability, day/night disturbance, behavioral changes, memory problems, and paranoia (according to the wife). He has a history of CVA and his last MRI done in 2011 showed extensive chronic small vessel changes throughout the brain. On his head CT scan today he has a new area that could be an old infarct.  Follow-up MRI showed atrophy and extensive white matter disease consistent with microvascular ischemia and a new but non-acute lacunar infarct.  Other differential diagnosis includes repeated hypoglycemia episodes because of decreased oral intake and continued lantus usage. He has had his insulin regimen scaled back over the last year because of recurrent hypoglycemia. His MMSE is not that markedly changed but his clock  drawing was markedly abnormal which may indicate further need for neuropsychiatric testing.  Psychiatry feels the patient is competent and rational, and they do not think he has dementia.  The poor clock-drawing may represent some decline is visio-spatial skills and very early stage dementia, but at this time, the patient is able to compensate.  HIV, RPR, B12, and TSH are normal.  Hypernatremia is now corrected. - UCx with scant colonies, essentially negative - Hold his sedating medications as above  - We appreciate Psychiatry's help with this patient  3.   Hypernatremia:  His sodium on admission today is 147. Calculated free water deficit is about 2.5L. Differential diagnosis includes medication effect from Celexa and depakote, decreased oral intake secondary to possible dementia, diuretic overuse, or osmotic diuresis from uncontrolled hyperglycemia. His A1C has been steadily dropping over the last few checks and he was hypoglycemic today which makes it less likely that he has uncontrolled hyperglycemia. Diuretic overuse would likely cause both a loss of both sodium as well as water.  Na was 144 this AM, so we have stopped the D5W. - Encourage free water intake   4.   Diabetes Mellitus Type II:  His A1C has been steadily dropping over the last few checks and he was markedly hypoglycemic on admission today. This coupled with this hypernatremia raise the concern for decreased oral intake or medication overuse. We will watch his calorie intake and decrease his Lantus to 20 units daily to try to avoid hypoglycemia.  - Continue Lantus 20U qHS, consider BID dosing - CBGs < 200   5.   Prophylaxis:  Heparin, Senna-S  6.   Dispo:  Placement in SNF for rehab.  CSW consulted   Length of Stay: 2 days   Signed by:  Dorthula Rue. Earlene Plater, MD PGY-I, Internal Medicine Pager [phone]  05/07/2012, 6:37 AM

## 2012-05-07 NOTE — Progress Notes (Signed)
Spoke with pts wife about discharge plans. Wife said that pt went to [facility] off [address] at one time and was pleased with the care. Wife was informed that due to some of the recent psychiatric issues that had recently been brought up that placement might not be at that exact facility but it may be that we send his info out to all facilities and see what we can get accepted to. [name]'s cell is [phone] if need to be contacted home or work.

## 2012-05-07 NOTE — Progress Notes (Signed)
Pt's heart monitor  Discontinued. Called report to 4N.  Pt stable.

## 2012-05-08 DIAGNOSIS — S3730XA Unspecified injury of urethra, initial encounter: Secondary | ICD-10-CM | POA: Clinically undetermined

## 2012-05-08 LAB — GLUCOSE, CAPILLARY
Glucose-Capillary: 123 mg/dL — ABNORMAL HIGH (ref 70–99)
Glucose-Capillary: 139 mg/dL — ABNORMAL HIGH (ref 70–99)

## 2012-05-08 NOTE — Progress Notes (Signed)
Subjective:    Interval Events:  [name] feels good this morning. He is ambulating well, without pain or weakness. He denies having chest pain, dyspnea, and abdominal pain.  He does state the hematuria worsened this morning.    Objective:    Vital Signs:   Temp:  [97.8 F (36.6 C)-98.8 F (37.1 C)] 98.3 F (36.8 C) (07/25 0612) Pulse Rate:  [61-68] 64  (07/25 0612) Resp:  [18-20] 20  (07/25 0612) BP: (140-199)/(60-68) 183/60 mmHg (07/25 0612) SpO2:  [91 %-97 %] 93 % (07/25 0612) Weight:  [221 lb 5.5 oz (100.4 kg)] 221 lb 5.5 oz (100.4 kg) (07/25 0500) Last BM Date: 05/08/12   24-hour weight change: Weight change: -10.5 oz (-0.299 kg)  Filed Weights   05/06/12 0500 05/07/12 0500 05/08/12 0500  Weight: 227 lb 1.2 oz (103 kg) 222 lb (100.699 kg) 221 lb 5.5 oz (100.4 kg)    Intake/Output:   Intake/Output Summary (Last 24 hours) at 05/08/12 0947 Last data filed at 05/07/12 2257  Gross per 24 hour  Intake    363 ml  Output    375 ml  Net    -12 ml      Physical Exam: Male genitalia: Normal uncircumcised penis, normal testes palpated bilaterally, no tenderness and no hernia detected; abnormal findings: urethral opening appears normal but blood visibile within urethra General appearance: alert, cooperative and no distress  Resp: clear to auscultation bilaterally  Cardio: regular rate and rhythm, S1, S2 normal and systolic murmur: systolic 2/6,  GI: soft, non-tender; bowel sounds normal; no masses, no organomegaly  Extremities: edema 1+   Labs: CBG:  Lab 05/08/12 0709 05/07/12 2246 05/07/12 1631 05/07/12 1136 05/07/12 0734  GLUCAP 133* 151* 155* 156* 132*    Microbiology: Results for orders placed during the hospital encounter of 05/05/12  URINE CULTURE     Status: Normal   Collection Time   05/06/12 10:21 AM      Component Value Range Status Comment   Specimen Description URINE, CLEAN CATCH   Final    Special Requests NONE   Final    Culture  Setup Time  05/06/2012 11:17   Final    Colony Count 3,000 COLONIES/ML   Final    Culture INSIGNIFICANT GROWTH   Final    Report Status 05/07/2012 FINAL   Final      Medications:     Scheduled Medications:    . amLODipine  10 mg Oral Daily  . atorvastatin  20 mg Oral q1800  . carvedilol  25 mg Oral BID WC  . citalopram  10 mg Oral Daily  . clopidogrel  75 mg Oral Q breakfast  . furosemide  80 mg Oral Daily  . heparin  5,000 Units Subcutaneous Q8H  . insulin aspart  0-9 Units Subcutaneous TID WC  . insulin glargine  20 Units Subcutaneous QHS  . lisinopril  20 mg Oral Daily  . senna-docusate  1 tablet Oral BID  . sodium chloride  3 mL Intravenous Q12H  . spironolactone  25 mg Oral Daily  . terazosin  5 mg Oral QHS  . DISCONTD: aspirin EC  81 mg Oral Daily  . DISCONTD: carvedilol  25 mg Oral Daily     PRN Medications: acetaminophen, acetaminophen, bisacodyl, docusate sodium, ondansetron (ZOFRAN) IV, ondansetron, traZODone   Assessment/ Plan:    1.   Generalized weakness:  [name] is a 71 year old man with the PMH listed above who presents with generalized  weakness and several falls in the last few days. He states that he feels like his knees are going to buckle and then he either falls or sits down. He has no concerning signs for acute neurological problems including stroke or seizure on exam today. He has some hypernatremia which can cause weakness but no other electrolyte abnormalities to explain this. His wife states that his physical activity has been decreased for sometime with him spending a lot of time in his room watching TV and only getting up to go to the bathroom. He has several mediations that could cause him to be weak and unsteady including Neurontin, Depakote, Coreg, and Celexa.  The Depakote is suspicious as this was started in early May. Physical exam today showed symmetric strength. Deconditioning may be the root cause of his weakness. - Encourage oral intake  - PT and  OT recommend 24-hour supervision vs. SNF  - Hold sedating medications: Depakote, gabapentin, Flexeril. We will likely restart all of these except for the Flexeril and maybe Depakote at discharge.  Dr. Pearlean Brownie is okay with stopping Depakote abruptly if this is thought to be a contributing factor.  We requested his notes from May today.  2.   Altered mental status concerning for dementia:  Per the wife's report, and after reviewing the last few clinic notes, there is concern for dementia in this patient. He has had emotional irritability, day/night disturbance, behavioral changes, memory problems, and paranoia (according to the wife). He has a history of CVA and his last MRI done in 2011 showed extensive chronic small vessel changes throughout the brain. On his head CT scan today he has a new area that could be an old infarct. Follow-up MRI showed atrophy and extensive white matter disease consistent with microvascular ischemia and a new but non-acute lacunar infarct. Other differential diagnosis includes repeated hypoglycemia episodes because of decreased oral intake and continued lantus usage. He has had his insulin regimen scaled back over the last year because of recurrent hypoglycemia. His MMSE is not that markedly changed but his clock drawing was markedly abnormal which may indicate further need for neuropsychiatric testing. Psychiatry feels the patient is competent and rational, and they do not think he has dementia. The poor clock-drawing may represent some decline is visio-spatial skills and very early stage dementia, but at this time, the patient is able to compensate. HIV, RPR, B12, and TSH are normal. Hypernatremia is now corrected.  UCx negative. - Hold his sedating medications as above  - We appreciate Psychiatry's help with this patient   3.   Hypernatremia:  His sodium on admission today is 147. Calculated free water deficit is about 2.5L. Differential diagnosis includes medication effect from  Celexa and depakote, decreased oral intake secondary to possible dementia, diuretic overuse, or osmotic diuresis from uncontrolled hyperglycemia. His A1C has been steadily dropping over the last few checks and he was hypoglycemic today which makes it less likely that he has uncontrolled hyperglycemia. Diuretic overuse would likely cause both a loss of both sodium as well as water. Na was 144 this AM, so we have stopped the D5W.  - Encourage free water intake  4.   Diabetes Mellitus Type II:  His A1C has been steadily dropping over the last few checks and he was markedly hypoglycemic on admission today. This coupled with this hypernatremia raise the concern for decreased oral intake or medication overuse. We will watch his calorie intake and decrease his Lantus to 20 units daily to try  to avoid hypoglycemia.  - Continue Lantus 20U qHS, consider BID dosing in the future - fasting CBG this AM was 133; all CBGs are < 200  5.   Urethral trauma/Hematuria:  Unsuccessful, traumatic bladder catheterization in the ED with resulting gross hematuria.  This was improving, but then worsened this AM.  Exam reveals normal, non-tender penis, but a small amount of gross blood within the urethra and on bed sheets.  We will follow this, and if it doesn't resolve in a week or so, he will need reevaluation and possibly a referral to urology.    6.   Prophylaxis:  Heparin, Senna-S   7.   Dispo:  Discharge today once SNF placement finalized.   Length of Stay: 3 days   Signed by:  Dorthula Rue. Earlene Plater, MD PGY-I, Internal Medicine Pager [phone]  05/08/2012, 9:47 AM

## 2012-05-08 NOTE — Care Management Note (Signed)
    Page 1 of 1   05/08/2012     11:16:28 AM   CARE MANAGEMENT NOTE 05/08/2012  Patient:  [name], [name]   Account Number:  [id number]  Date Initiated:  05/08/2012  Documentation initiated by:  Onnie Boer  Subjective/Objective Assessment:   PT WAS ADMITTED WITH AMS     Action/Plan:   PROGRESSION OF CARE AND DISCHARGE PLANNING   Anticipated DC Date:  05/10/2012   Anticipated DC Plan:  SKILLED NURSING FACILITY         Choice offered to / List presented to:             Status of service:  In process, will continue to follow Medicare Important Message given?   (If response is "NO", the following Medicare IM given date fields will be blank) Date Medicare IM given:   Date Additional Medicare IM given:    Discharge Disposition:    Per UR Regulation:  Reviewed for med. necessity/level of care/duration of stay  If discussed at Long Length of Stay Meetings, dates discussed:    Comments:  05/08/12 Onnie Boer, RN, BSN 1114 PT WAS ADMITTED FROM HOME WITH AMS.  PTA PT WAS CARING FOR HIMSELF ALONG WITH THE ASISTANCE OF HIS WIFE FOR MAKING SURE HE HAS TAKEN HIS MEDICATIONS.  WILL F/U ON DC NEEDS
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
## 2012-05-08 NOTE — Progress Notes (Signed)
Patient [name], 71 year old African American male has an upbeat attitude about his recovery.  Patient prefers to watch his favorite television show this morning.  He thanked Orthoptist for checking on him.  I will follow up later in the day.

## 2012-05-08 NOTE — Progress Notes (Signed)
Internal Medicine Teaching Service Attending Note Date: 05/08/2012  Patient name: [name]  Medical record number: [id number]  Date of birth: [birth year]    This patient has been seen and discussed with the house staff. Please see their note for complete details. I concur with their findings with the following additions/corrections: [name] was resting comfortably in bed. He is ready for D/C SNF for rehab.   BUTCHER,ELIZABETH 05/08/2012, 1:17 PM

## 2012-05-09 LAB — CBC
Hemoglobin: 12.6 g/dL — ABNORMAL LOW (ref 13.0–17.0)
MCH: 26.1 pg (ref 26.0–34.0)
MCV: 83.2 fL (ref 78.0–100.0)
RBC: 4.83 MIL/uL (ref 4.22–5.81)
RDW: 16.5 % — ABNORMAL HIGH (ref 11.5–15.5)

## 2012-05-09 LAB — GLUCOSE, CAPILLARY: Glucose-Capillary: 114 mg/dL — ABNORMAL HIGH (ref 70–99)

## 2012-05-09 LAB — BASIC METABOLIC PANEL
CO2: 33 mEq/L — ABNORMAL HIGH (ref 19–32)
Chloride: 103 mEq/L (ref 96–112)
Creatinine, Ser: 1.22 mg/dL (ref 0.50–1.35)
Glucose, Bld: 162 mg/dL — ABNORMAL HIGH (ref 70–99)
Potassium: 4 mEq/L (ref 3.5–5.1)
Sodium: 143 mEq/L (ref 135–145)

## 2012-05-09 NOTE — Progress Notes (Signed)
RN Notified of abnormal BP

## 2012-05-09 NOTE — Progress Notes (Signed)
Occupational Therapy Treatment Patient Details Name: [name] MRN: [id number] DOB: 1941-09-13 Today's Date: 05/09/2012 Time: 4098-1191 OT Time Calculation (min): 18 min  OT Assessment / Plan / Recommendation Comments on Treatment Session Pt. making steady progress with balance and independence with BADLs    Follow Up Recommendations  Skilled nursing facility    Barriers to Discharge       Equipment Recommendations  Defer to next venue    Recommendations for Other Services    Frequency Min 2X/week   Plan Discharge plan remains appropriate    Precautions / Restrictions Precautions Precautions: Fall Restrictions Weight Bearing Restrictions: No   Pertinent Vitals/Pain     ADL  Grooming: Performed;Wash/dry hands;Supervision/safety Where Assessed - Grooming: Supported Copywriter, advertising: Performed;Min guard (mod verbal cues to keep walker with him when turning) Toilet Transfer Method: Sit to Barista: Comfort height toilet Toileting - Clothing Manipulation and Hygiene: Performed;Min guard Where Assessed - Engineer, mining and Hygiene: Standing Equipment Used: Rolling walker Transfers/Ambulation Related to ADLs: min guard assist.  Pt. requires min - mod verbal cues for walker safety    OT Diagnosis:    OT Problem List:   OT Treatment Interventions:     OT Goals ADL Goals ADL Goal: Grooming - Progress: Progressing toward goals ADL Goal: Statistician - Progress: Progressing toward goals ADL Goal: Toileting - Clothing Manipulation - Progress: Progressing toward goals ADL Goal: Toileting - Hygiene - Progress: Progressing toward goals  Visit Information  Last OT Received On: 05/09/12 Assistance Needed: +1    Subjective Data      Prior Functioning       Cognition  Overall Cognitive Status: Appears within functional limits for tasks assessed/performed Arousal/Alertness: Awake/alert Orientation Level: Appears  intact for tasks assessed Behavior During Session: [facility] for tasks performed    Mobility Bed Mobility Bed Mobility: Supine to Sit Supine to Sit: 6: Modified independent (Device/Increase time) Transfers Transfers: Sit to Stand;Stand to Sit Sit to Stand: 5: Supervision;With upper extremity assist;From bed Stand to Sit: 5: Supervision;With upper extremity assist;To chair/3-in-1   Exercises    Balance    End of Session OT - End of Session Activity Tolerance: Patient tolerated treatment well Patient left: in chair;with call bell/phone within reach  GO     [name] M 05/09/2012, 5:13 PM

## 2012-05-09 NOTE — Progress Notes (Signed)
Subjective:    Interval Events:  [name] feels good this morning, and was enjoying breakfast when I saw him. He is ambulating well without pain, but with some mild weakness. He denies having chest pain, dyspnea, and abdominal pain. He did not see in blood in his urine this morning.    Objective:    Vital Signs:   Temp:  [97.6 F (36.4 C)-98.3 F (36.8 C)] 98.3 F (36.8 C) (07/26 0526) Pulse Rate:  [56-63] 56  (07/26 0526) Resp:  [16-21] 18  (07/26 0526) BP: (134-162)/(54-80) 134/54 mmHg (07/26 0526) SpO2:  [93 %-99 %] 96 % (07/26 0526) Weight:  [217 lb 13 oz (98.8 kg)] 217 lb 13 oz (98.8 kg) (07/26 0526) Last BM Date: 05/08/12   Weights: 24-hour Weight change: -3 lb 8.4 oz (-1.6 kg)  Filed Weights   05/07/12 0500 05/08/12 0500 05/09/12 0526  Weight: 222 lb (100.699 kg) 221 lb 5.5 oz (100.4 kg) 217 lb 13 oz (98.8 kg)    Intake/Output:   Intake/Output Summary (Last 24 hours) at 05/09/12 1478 Last data filed at 05/08/12 2330  Gross per 24 hour  Intake    120 ml  Output      0 ml  Net    120 ml      Physical Exam: General appearance: alert, cooperative and no distress  Resp: clear to auscultation bilaterally  Cardio: regular rate and rhythm, S1, S2 normal and systolic murmur: systolic 2/6,  GI: soft, non-tender; bowel sounds normal; no masses, no organomegaly  Extremities: edema trace   Labs: CBG:  Lab 05/09/12 0653 05/08/12 2116 05/08/12 1641 05/08/12 1202 05/08/12 0709  GLUCAP 114* 156* 139* 123* 133*     Medications:    Scheduled Medications:    . amLODipine  10 mg Oral Daily  . atorvastatin  20 mg Oral q1800  . carvedilol  25 mg Oral BID WC  . citalopram  10 mg Oral Daily  . clopidogrel  75 mg Oral Q breakfast  . furosemide  80 mg Oral Daily  . heparin  5,000 Units Subcutaneous Q8H  . insulin aspart  0-9 Units Subcutaneous TID WC  . insulin glargine  20 Units Subcutaneous QHS  . lisinopril  20 mg Oral Daily  . senna-docusate  1 tablet  Oral BID  . sodium chloride  3 mL Intravenous Q12H  . spironolactone  25 mg Oral Daily  . terazosin  5 mg Oral QHS     PRN Medications: acetaminophen, acetaminophen, bisacodyl, docusate sodium, ondansetron (ZOFRAN) IV, ondansetron, traZODone   Assessment/ Plan:    1. Generalized weakness: He presented with generalized weakness and several falls in the last few days. He states that he feels like his knees are going to buckle and then he either falls or sits down. He had no concerning signs for acute neurological problems including stroke or seizure at admission. He has some hypernatremia which can cause weakness but no other electrolyte abnormalities to explain this. His wife states that his physical activity has been decreased for sometime with him spending a lot of time in his room watching TV and only getting up to go to the bathroom. He has several mediations that could cause him to be weak and unsteady including Neurontin, Depakote, Coreg, and Celexa. The Depakote is suspicious as this was started in April. Deconditioning may also be to blame. We treated the hypernatremia (see #3) and held his Neurontin, Flexeril, Depakote, Celexa, and terazosin initially. By discharge, we had restarted  the Celexa and terazosin. We will restart gabapentin at discharge, but stop the Depakote and Flexeril. PT and OT recommended rehabilitation in a SNF. - We will check a BMET this AM  2. Altered mental status concerning for dementia: Per the wife's report, and after reviewing the last few clinic notes, there is concern for dementia in this patient. He has had emotional irritability, day/night disturbance, behavioral changes, memory problems, and paranoia (according to the wife). He has a history of CVA and his last MRI done in 2011 showed extensive chronic small vessel changes throughout the brain. On his head CT scan today he has a new area that could be an old infarct. Follow-up MRI showed atrophy and extensive  white matter disease consistent with microvascular ischemia and a new but non-acute lacunar infarct. Other differential diagnosis includes repeated hypoglycemia episodes because of decreased oral intake and continued Lantus usage. He has had his insulin regimen scaled back over the last year because of recurrent hypoglycemia. His MMSE is not that markedly changed but his clock drawing was markedly abnormal. Psychiatry feels the patient is competent and rational, and they do not think he has dementia. The poor clock-drawing may represent some decline is visio-spatial skills and very early stage dementia, but at this time, the patient is able to compensate. HIV, RPR, B12, and TSH were normal. Hypernatremia is now corrected. UCx is negative.   3. Hypernatremia: His sodium on admission today was 147. Calculated free water deficit was about 2.5L. Differential diagnosis includes medication effect from Celexa and depakote, decreased oral intake secondary to possible dementia, diuretic overuse, or osmotic diuresis from uncontrolled hyperglycemia. His A1C has been steadily dropping over the last few checks and he was hypoglycemic today which makes it less likely that he has uncontrolled hyperglycemia. Diuretic overuse would likely cause both a loss of both sodium as well as water. After infusion of D5W, the sodium normalized and remained stable. We encourage the patient to maintain free water intake.   4. Diabetes Mellitus Type II: His A1C has been steadily dropping over the last few checks and he was markedly hypoglycemic on admission today. This coupled with this hypernatremia raise the concern for decreased oral intake or medication overuse. We decreased his Lantus dosing to 20U qHS and CBGs were well controlled (all < 200) during his hospitalization.  5. Urethral trauma: Unsuccessful, traumatic bladder catheterization in the ED with resulting gross hematuria. This was improving, but then worsened yesterday. Exam  reveals normal, non-tender penis, but a small amount of gross blood within the urethra and on bed sheets. He will follow-up with internal medicine. If this doesn't resolve by then, referral to urology may be necessary.  Today, there was no report of gross hematuria. - We will check a CBC this AM  6. Disposition:  Discharge today to SNF.  Awaiting placement from CSW.   Length of Stay: 4 days   Signed by:  Dorthula Rue. Earlene Plater, MD PGY-I, Internal Medicine Pager [phone]  05/09/2012, 6:39 AM

## 2012-05-09 NOTE — Progress Notes (Signed)
Clinical Social Work-On 7/25 CSW M.Englebretson has submitted additional information to [facility] for review due to pt psych history- As of this morning passarr reviewing information and as soon as number provided this CSW will facilitate d/c. Pt and wife have chosen [facility]-Pt wife will be completing d/c paperwork today- CSW following and will alter treatment team once passarr # received- Kruti Horacek-MSW. [phone]

## 2012-05-09 NOTE — Progress Notes (Signed)
Internal Medicine Teaching Service Attending Note Date: 05/09/2012  Patient name: [name]  Medical record number: [id number]  Date of birth: 1941/10/13    This patient has been seen and discussed with the house staff. Please see their note for complete details. I concur with their findings with the following additions/corrections: Mr Heater's D/C was delayed as a facility has not been found. Medical issues stable. D/C once bed available.   Bartlomiej Jenkinson 05/09/2012, 12:48 PM

## 2012-05-09 NOTE — Progress Notes (Signed)
Physical Therapy Treatment Patient Details Name: [name] MRN: [id number] DOB: 1940-12-23 Today's Date: 05/09/2012 Time: 4098-1191 PT Time Calculation (min): 19 min  PT Assessment / Plan / Recommendation Comments on Treatment Session  Patient progressing well and able to increase activity tolerance. Patient still requiring cues for safety with RW and safety with overall activity. Patient educated to call for assistance and not to get up without assistance    Follow Up Recommendations  Home health PT    Barriers to Discharge        Equipment Recommendations  None recommended by PT    Recommendations for Other Services    Frequency Min 3X/week   Plan Discharge plan remains appropriate;Frequency remains appropriate    Precautions / Restrictions Precautions Precautions: Fall   Pertinent Vitals/Pain     Mobility  Bed Mobility Supine to Sit: 6: Modified independent (Device/Increase time) Sitting - Scoot to Edge of Bed: 6: Modified independent (Device/Increase time) Transfers Sit to Stand: 5: Supervision;With upper extremity assist;From bed Stand to Sit: 5: Supervision;With upper extremity assist;To chair/3-in-1 Details for Transfer Assistance: cues not to pull on RW Ambulation/Gait Ambulation/Gait Assistance: 4: Min guard Ambulation Distance (Feet): 600 Feet Assistive device: Rolling walker Ambulation/Gait Assistance Details: patient required cues to slow down and take time to increase balance and safety. Cues for safety with RW Gait Pattern: Step-through pattern;Decreased stride length    Exercises     PT Diagnosis:    PT Problem List:   PT Treatment Interventions:     PT Goals Acute Rehab PT Goals PT Goal: Supine/Side to Sit - Progress: Progressing toward goal PT Goal: Sit to Stand - Progress: Progressing toward goal PT Goal: Stand to Sit - Progress: Progressing toward goal PT Goal: Ambulate - Progress: Progressing toward goal  Visit Information  Last PT  Received On: 05/09/12 Assistance Needed: +1    Subjective Data      Cognition  Overall Cognitive Status: Appears within functional limits for tasks assessed/performed Arousal/Alertness: Awake/alert Orientation Level: Appears intact for tasks assessed Behavior During Session: [facility] for tasks performed    Balance     End of Session PT - End of Session Equipment Utilized During Treatment: Gait belt Activity Tolerance: Patient tolerated treatment well Patient left: in chair;with call bell/phone within reach   GP     Robinette, Adline Potter 05/09/2012, 1:21 PM 05/09/2012 Fredrich Birks PTA [phone] pager [phone] office

## 2012-05-10 LAB — GLUCOSE, CAPILLARY: Glucose-Capillary: 128 mg/dL — ABNORMAL HIGH (ref 70–99)

## 2012-05-10 MED ORDER — CARVEDILOL 25 MG PO TABS: 25.0000 mg | ORAL_TABLET | Freq: Two times a day (BID) | ORAL | Status: DC

## 2012-05-10 MED ORDER — INSULIN GLARGINE 100 UNIT/ML ~~LOC~~ SOLN: 20.0000 [IU] | Freq: Every day | SUBCUTANEOUS | Status: DC

## 2012-05-10 NOTE — Progress Notes (Signed)
Subjective:    Interval Events:  [name] feels good this morning, and was enjoying breakfast when I saw him. He is ambulating well without pain, and with only mild weakness. He denies having chest pain, dyspnea, and abdominal pain. He did not see in blood in his urine this morning.    Objective:    Vital Signs:   Temp:  [98 F (36.7 C)-98.4 F (36.9 C)] 98.4 F (36.9 C) (07/27 0508) Pulse Rate:  [51-65] 51  (07/27 0508) Resp:  [18-20] 20  (07/27 0508) BP: (110-145)/(56-60) 110/60 mmHg (07/27 1012) SpO2:  [94 %-100 %] 100 % (07/27 0508) Last BM Date: 05/09/12   Weights: Filed Weights   05/07/12 0500 05/08/12 0500 05/09/12 0526  Weight: 222 lb (100.699 kg) 221 lb 5.5 oz (100.4 kg) 217 lb 13 oz (98.8 kg)    Intake/Output:   Intake/Output Summary (Last 24 hours) at 05/10/12 1117 Last data filed at 05/09/12 2300  Gross per 24 hour  Intake    120 ml  Output      0 ml  Net    120 ml       Physical Exam: General appearance: alert, cooperative and no distress  Resp: clear to auscultation bilaterally  Cardio: regular rate and rhythm, S1, S2 normal and systolic murmur: systolic 2/6,  GI: soft, non-tender; bowel sounds normal; no masses, no organomegaly  Extremities: edema trace   Labs: CBG:  Lab 05/10/12 0651 05/09/12 2119 05/09/12 1642 05/09/12 1201 05/09/12 0653  GLUCAP 125* 192* 162* 146* 114*     Medications:   Scheduled Medications:    . amLODipine  10 mg Oral Daily  . atorvastatin  20 mg Oral q1800  . carvedilol  25 mg Oral BID WC  . citalopram  10 mg Oral Daily  . clopidogrel  75 mg Oral Q breakfast  . furosemide  80 mg Oral Daily  . heparin  5,000 Units Subcutaneous Q8H  . insulin aspart  0-9 Units Subcutaneous TID WC  . insulin glargine  20 Units Subcutaneous QHS  . lisinopril  20 mg Oral Daily  . senna-docusate  1 tablet Oral BID  . sodium chloride  3 mL Intravenous Q12H  . spironolactone  25 mg Oral Daily  . terazosin  5 mg Oral QHS     PRN Medications: acetaminophen, acetaminophen, bisacodyl, docusate sodium, ondansetron (ZOFRAN) IV, ondansetron, traZODone   Assessment/ Plan:    1. Generalized weakness: He presented with generalized weakness and several falls in the last few days. He states that he feels like his knees are going to buckle and then he either falls or sits down. He had no concerning signs for acute neurological problems including stroke or seizure at admission. He has some hypernatremia which can cause weakness but no other electrolyte abnormalities to explain this. His wife states that his physical activity has been decreased for sometime with him spending a lot of time in his room watching TV and only getting up to go to the bathroom. He has several mediations that could cause him to be weak and unsteady including Neurontin, Depakote, Coreg, and Celexa. The Depakote is suspicious as this was started in April. Deconditioning may also be to blame. We treated the hypernatremia (see #3) and held his Neurontin, Flexeril, Depakote, Celexa, and terazosin initially. By discharge, we had restarted the Celexa and terazosin. We will restart gabapentin at discharge, but stop the Depakote and Flexeril. PT and OT recommended rehabilitation in a SNF.   2.  Altered mental status concerning for dementia: Per the wife's report, and after reviewing the last few clinic notes, there is concern for dementia in this patient. He has had emotional irritability, day/night disturbance, behavioral changes, memory problems, and paranoia (according to the wife). He has a history of CVA and his last MRI done in 2011 showed extensive chronic small vessel changes throughout the brain. On his head CT scan today he has a new area that could be an old infarct. Follow-up MRI showed atrophy and extensive white matter disease consistent with microvascular ischemia and a new but non-acute lacunar infarct. Other differential diagnosis includes repeated  hypoglycemia episodes because of decreased oral intake and continued Lantus usage. He has had his insulin regimen scaled back over the last year because of recurrent hypoglycemia. His MMSE is not that markedly changed but his clock drawing was markedly abnormal. Psychiatry feels the patient is competent and rational, and they do not think he has dementia. The poor clock-drawing may represent some decline is visio-spatial skills and very early stage dementia, but at this time, the patient is able to compensate. HIV, RPR, B12, and TSH were normal. Hypernatremia is now corrected. UCx is negative. We treated as in #1 and also had the wife remove guns from the home. At discharge, the patient was stable from a psychiatric standpoint and safe for discharge.   3. Hypernatremia: His sodium on admission today was 147. Calculated free water deficit was about 2.5L. Differential diagnosis includes medication effect from Celexa and depakote, decreased oral intake secondary to possible dementia, diuretic overuse, or osmotic diuresis from uncontrolled hyperglycemia. His A1C has been steadily dropping over the last few checks and he was hypoglycemic today which makes it less likely that he has uncontrolled hyperglycemia. Diuretic overuse would likely cause both a loss of both sodium as well as water. After infusion of D5W, the sodium normalized and remained stable. We encourage the patient to maintain free water intake.   4. Diabetes Mellitus Type II: His A1C has been steadily dropping over the last few checks and he was markedly hypoglycemic on admission today. This coupled with this hypernatremia raise the concern for decreased oral intake or medication overuse. We decreased his Lantus dosing to 20U qHS and CBGs were well controlled (all < 200) during his hospitalization. We discharge him on 20U Lantus qHS.   5. Urethral trauma: Unsuccessful, traumatic bladder catheterization in the ED with resulting gross hematuria. This is  improving and on the day of discharge he reported no gross hematuria. Exam reveals normal, non-tender penis, but a small amount of gross blood within the urethra and on bed sheets. He will follow-up with internal medicine. If this hasn't completely resolved by then, referral to urology may be necessary.  6. Prophylaxis: heparin  7. Disposition: discharge to SNF today  Length of Stay: 5 days   Signed by:  Dorthula Rue. Earlene Plater, MD PGY-I, Internal Medicine Pager [phone]  05/10/2012, 11:17 AM

## 2012-05-10 NOTE — Progress Notes (Signed)
Pt to transfer to [facility] today via family transport.  Pt, pt's wife (at bedside), and SNF aware of d/c.  D/C packet complete with chart copy and signed FL2. CSW signing off as no other CSW needs identified.  Dellie Burns, MSW, [place] [phone] (weekend)

## 2012-05-10 NOTE — Progress Notes (Signed)
Pt's [facility] # has been issued and has SNF bed [facility]) available today once d/c summary complete.  Dellie Burns, MSW, [place] [phone] (weekend)

## 2012-05-21 ENCOUNTER — Ambulatory Visit (INDEPENDENT_AMBULATORY_CARE_PROVIDER_SITE_OTHER): Payer: Medicare Other | Admitting: Internal Medicine

## 2012-05-21 ENCOUNTER — Encounter: Payer: Self-pay | Admitting: Internal Medicine

## 2012-05-21 VITALS — BP 130/63 | HR 54 | Temp 99.4°F | Ht 67.0 in | Wt 224.0 lb

## 2012-05-21 DIAGNOSIS — I10 Essential (primary) hypertension: Secondary | ICD-10-CM

## 2012-05-21 DIAGNOSIS — G3184 Mild cognitive impairment, so stated: Secondary | ICD-10-CM

## 2012-05-21 DIAGNOSIS — S3730XA Unspecified injury of urethra, initial encounter: Secondary | ICD-10-CM

## 2012-05-21 DIAGNOSIS — E119 Type 2 diabetes mellitus without complications: Secondary | ICD-10-CM

## 2012-05-21 DIAGNOSIS — X58XXXA Exposure to other specified factors, initial encounter: Secondary | ICD-10-CM

## 2012-05-21 LAB — GLUCOSE, CAPILLARY: Glucose-Capillary: 123 mg/dL — ABNORMAL HIGH (ref 70–99)

## 2012-05-21 LAB — BASIC METABOLIC PANEL
BUN: 25 mg/dL — ABNORMAL HIGH (ref 6–23)
CO2: 29 mEq/L (ref 19–32)
Glucose, Bld: 94 mg/dL (ref 70–99)

## 2012-05-21 MED ORDER — DONEPEZIL HCL 5 MG PO TABS: 5.0000 mg | ORAL_TABLET | Freq: Every day | ORAL | Status: DC

## 2012-05-21 NOTE — Progress Notes (Signed)
HPI The patient is a 71 y.o. yo male with a history of dementia, DM2, HL, HTN, presenting for a hospital follow-up.  The patient was recently hospitalized 7/22-7/25 with weakness, hypernatremia, AMS, and poorly controlled DM.  The patient was thought to have a component of developing dementia, as well as some contribution of polypharmacy, and the patient's depakote and flexeril were stopped. Since hospital discharge, he has been at a SNF.  He notes increase in strength with PT, and notes that he has been walking more.  The patient's daughter notes that he still has some personality changes (as noted in the months leading up to hospitalization), including paranoia (particularly perseveration on the idea that his wife is cheating on him) and decreased talkativeness.  His blood sugars have been around 90's-100's in the morning, with no episodes of low blood sugar.  Prior to hospitalization, he was having significant hypoglycemia.  His last A1C was 6.1 on 05/05/12.  His Lantus was decreased from 35 units to 20 units.  He notes no episodes of hyperglycemia, with no blurry vision or polyuria/polydipsia.  His last eye exam was 1 month ago.  The patient notes no hematuria since hospital discharge (transient hematuria during hospitalization due to traumatic foley placement).  ROS: General: no fevers, chills, changes in weight, changes in appetite Skin: no rash HEENT: no blurry vision, hearing changes, sore throat Pulm: no dyspnea, coughing, wheezing CV: no chest pain, palpitations, shortness of breath Abd: no abdominal pain, nausea/vomiting, diarrhea/constipation GU: no dysuria, hematuria, polyuria Ext: no arthralgias, myalgias Neuro: no weakness, numbness, or tingling  Filed Vitals:   05/21/12 1536  BP: 130/63  Pulse: 54  Temp: 99.4 F (37.4 C)    PEX General: alert, cooperative, and in no apparent distress HEENT: pupils equal round and reactive to light, vision grossly intact, oropharynx clear  and non-erythematous  Neck: supple, no lymphadenopathy Lungs: clear to ascultation bilaterally, normal work of respiration, no wheezes, rales, ronchi Heart: regular rate and rhythm, no murmurs, gallops, or rubs Abdomen: soft, non-tender, non-distended, normal bowel sounds Extremities: no cyanosis, clubbing, or edema Neurologic: alert & oriented X3, cranial nerves II-XII intact, strength grossly intact, sensation intact to light touch  Assessment/Plan

## 2012-05-21 NOTE — Patient Instructions (Signed)
We are prescribing a medication, Aricept, 1 tablet once per day, to help with memory or any early stages of cognitive impairment.  We are checking labs to ensure that there is no further blood in your urine, or any imbalance of electrolytes in your blood.  If the results are abnormal, we will call you.  If you do not hear from [place], you can assume everything was normal.  Please return for a follow-up visit in 3 months.

## 2012-05-22 LAB — URINALYSIS, ROUTINE W REFLEX MICROSCOPIC
Bilirubin Urine: NEGATIVE
Glucose, UA: NEGATIVE mg/dL
Hgb urine dipstick: NEGATIVE
Ketones, ur: NEGATIVE mg/dL
Protein, ur: NEGATIVE mg/dL
pH: 5 (ref 5.0–8.0)

## 2012-05-25 ENCOUNTER — Encounter: Payer: Self-pay | Admitting: Internal Medicine

## 2012-05-25 DIAGNOSIS — G3184 Mild cognitive impairment, so stated: Secondary | ICD-10-CM | POA: Insufficient documentation

## 2012-05-25 NOTE — Assessment & Plan Note (Signed)
The patient notes no episodes of hypo or hyperglycemia since decreasing his lantus from 35 to 20 units.  His A1C before the change was 6.1. -continue Lantus 20 to prevent hypoglycemia -check A1C at next visit, may need to increase lantus somewhat at that time

## 2012-05-25 NOTE — Assessment & Plan Note (Signed)
The paranoia and personality changes described by the patient's daughter (and wife on previous visit) likely represent mild cognitive impairment, though the patient does not meet criteria for dementia by MMSE or inpatient psych eval 04/2012.  TSH and vit B-12 were checked during last hospitalization, and were normal.  MRI during hospitalization 04/2012 showed diffuse white matter disease. -prescribed aricept, will follow-up with family at next visit to see if any improvement is observed

## 2012-05-25 NOTE — Assessment & Plan Note (Signed)
BP well controlled, continue current regimen. 

## 2012-05-25 NOTE — Assessment & Plan Note (Addendum)
Hematuria following foley placement during hospitalization has resolved. -UA today shows no hematuria

## 2012-06-06 ENCOUNTER — Telehealth: Payer: Self-pay | Admitting: *Deleted

## 2012-06-06 NOTE — Telephone Encounter (Signed)
homecare orders have been given by the facility that pt has been residing in, he will be released Sunday to home and he will need HHN, PT and social work, they are faxing you a copy of these orders and eventually will ask you to sign for these, pt has appt 9/11 with you

## 2012-06-11 ENCOUNTER — Telehealth: Payer: Self-pay | Admitting: *Deleted

## 2012-06-11 NOTE — Telephone Encounter (Signed)
Jan, PT [facility], she will be seeing pt 3 times a week for 3 weeks and 2 times a week for 2 weeks for balance issues and endurance issues, this was verbally approved, do you agree?

## 2012-06-11 NOTE — Telephone Encounter (Signed)
That's perfect, thank you for addressing this issue for me.  I fully agree with this order.

## 2012-06-19 ENCOUNTER — Telehealth: Payer: Self-pay | Admitting: *Deleted

## 2012-06-19 NOTE — Telephone Encounter (Signed)
Pt seen for social word evaluation and Gennice MSW with [facility] is requesting a VO to continue seeing pt for 2 more visits. She is working to set up [employer] and services. ie - adult day care, food assistance.  I gave the Verbal Order - is that okay? # [id number]

## 2012-06-20 NOTE — Telephone Encounter (Signed)
That's perfect, thank you!

## 2012-06-24 ENCOUNTER — Telehealth: Payer: Self-pay | Admitting: *Deleted

## 2012-06-24 NOTE — Telephone Encounter (Signed)
[facility]N calls and ask to reduce visits to 1 x weekly for nursing, she states pt is not crazy about nursing visits and has been doing well, other services will continue. She is given verbal ok, are you good with this?

## 2012-06-24 NOTE — Telephone Encounter (Signed)
Sounds good, thank you 

## 2012-06-25 ENCOUNTER — Ambulatory Visit (INDEPENDENT_AMBULATORY_CARE_PROVIDER_SITE_OTHER): Payer: Medicare Other | Admitting: Internal Medicine

## 2012-06-25 ENCOUNTER — Encounter: Payer: Self-pay | Admitting: Internal Medicine

## 2012-06-25 VITALS — BP 124/57 | HR 56 | Temp 99.0°F | Ht 67.0 in | Wt 222.4 lb

## 2012-06-25 DIAGNOSIS — Z0000 Encounter for general adult medical examination without abnormal findings: Secondary | ICD-10-CM

## 2012-06-25 DIAGNOSIS — R296 Repeated falls: Secondary | ICD-10-CM

## 2012-06-25 DIAGNOSIS — Z23 Encounter for immunization: Secondary | ICD-10-CM

## 2012-06-25 DIAGNOSIS — I6350 Cerebral infarction due to unspecified occlusion or stenosis of unspecified cerebral artery: Secondary | ICD-10-CM

## 2012-06-25 DIAGNOSIS — Z9181 History of falling: Secondary | ICD-10-CM

## 2012-06-25 DIAGNOSIS — I10 Essential (primary) hypertension: Secondary | ICD-10-CM

## 2012-06-25 DIAGNOSIS — G3184 Mild cognitive impairment, so stated: Secondary | ICD-10-CM

## 2012-06-25 DIAGNOSIS — Z79899 Other long term (current) drug therapy: Secondary | ICD-10-CM

## 2012-06-25 DIAGNOSIS — E119 Type 2 diabetes mellitus without complications: Secondary | ICD-10-CM

## 2012-06-25 LAB — BASIC METABOLIC PANEL
BUN: 36 mg/dL — ABNORMAL HIGH (ref 6–23)
CO2: 32 mEq/L (ref 19–32)
Chloride: 105 mEq/L (ref 96–112)
Glucose, Bld: 139 mg/dL — ABNORMAL HIGH (ref 70–99)
Potassium: 4.4 mEq/L (ref 3.5–5.3)
Sodium: 144 mEq/L (ref 135–145)

## 2012-06-25 LAB — CBC
HCT: 38.9 % — ABNORMAL LOW (ref 39.0–52.0)
Hemoglobin: 12 g/dL — ABNORMAL LOW (ref 13.0–17.0)
MCH: 26.1 pg (ref 26.0–34.0)
MCHC: 30.8 g/dL (ref 30.0–36.0)
MCV: 84.6 fL (ref 78.0–100.0)
RDW: 15.3 % (ref 11.5–15.5)
WBC: 5.4 10*3/uL (ref 4.0–10.5)

## 2012-06-25 NOTE — Assessment & Plan Note (Addendum)
Stable.  On Lantus 20 units.  No reported hypoglycemia episodes.  Lowest reading from monitor was noted to be 87.    -recheck A1C in October -return to clinic for follow up with PCP in three months -foot exam done -f/u BMP--renal function elevated on prior labs -continue to monitor

## 2012-06-25 NOTE — Assessment & Plan Note (Addendum)
Stable. BP today 12457  -Continue Norvasc, coreg, Eplerenone, Lasix, and Lisinopril -continue to monitor

## 2012-06-25 NOTE — Progress Notes (Signed)
Subjective:   Patient ID: [name] male   DOB: August 26, 1941 71 y.o.   MRN: [id number]  HPI: [name] is a 71 y.o. African American male with past medical history of cerebral aneurysm, stroke, TIA, diabetes, and hypertension presenting to clinic today for followup appointment after being recently discharged from skilled nursing facility approximately 2 weeks prior.  [name] was admitted in the hospital during the month of July for frequent falls and hypernatremia.  He reports no recent falls since Hospital admission, was successfully discharged from skilled nursing facility with home health PT and home health nursing.  He lives at home with his wife currently.  He continues to have dementia as per wife and suspicious thoughts towards the wife in regards to infidelity.  His wife is also in the room with him during this visit. She mentions that he is walking better since his admission, uses a cane at home for support, allows physical therapy to work with him, but does not regularly monitor his sugar, and that she is worried about his mental status and worsening dementia.  They have agreed to allow social worker to get involved.  [name] claims to have reason for suspicious thoughts, he says his wife packs up things late at night and puts him in the car and he does not see the items again.  His wife claims she has been cleaning up the house lately and donating to [facility] and that she does not do these things at night and she is usually asleep while he is awake throughout the night and sleeps during the day.  Of note, his wife did inform us that Mr. Murdaugh did not live with her from the years 1999 to 2006 and moved then only when his health started getting worse back in 2006.  He has been living with her since then and his suspicious thoughts were noticeable from that time.  Otherwise [name] has no major complaints at this time. He denies any fever, nausea, vomiting, headaches,  chest pain, shortness of breath, abdominal pain, urinary complaints at this time. He claims to be adherent well his medications.   Past Medical History  Diagnosis Date  . Stroke     left thalamic infarct  . Diabetes mellitus   . Hypertension   . 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 TIA (transient ischemic attack)     multiple in past, most recently in 2011  . Sleep apnea    Current Outpatient Prescriptions  Medication Sig Dispense Refill  . amLODipine (NORVASC) 10 MG tablet Take 10 mg by mouth daily.      . carvedilol (COREG) 25 MG tablet Take 1 tablet (25 mg total) by mouth 2 (two) times daily with a meal.      . citalopram (CELEXA) 10 MG tablet Take 20 mg by mouth Daily. Take 2 tablets daily for mood.      . clopidogrel (PLAVIX) 75 MG tablet Take 75 mg by mouth daily.      Marland Kitchen donepezil (ARICEPT) 5 MG tablet Take 1 tablet (5 mg total) by mouth at bedtime.  30 tablet  2  . eplerenone (INSPRA) 50 MG tablet Take 50 mg by mouth 2 (two) times daily.      . furosemide (LASIX) 80 MG tablet Take 80 mg by mouth daily.      Marland Kitchen gabapentin (NEURONTIN) 600  MG tablet Take 600 mg by mouth 3 (three) times daily.      . insulin glargine (LANTUS SOLOSTAR) 100 UNIT/ML injection Inject 20 Units into the skin at bedtime.  10 mL  12  . lisinopril (PRINIVIL,ZESTRIL) 20 MG tablet TAKE ONE TABLET BY MOUTH EVERY DAY  30 tablet  11  . simvastatin (ZOCOR) 40 MG tablet TAKE ONE TABLET BY MOUTH EVERY DAY AT BEDTIME  30 tablet  11  . terazosin (HYTRIN) 5 MG capsule Take 5 mg by mouth at bedtime.      . cyclobenzaprine (FLEXERIL) 5 MG tablet       . divalproex (DEPAKOTE ER) 500 MG 24 hr tablet       . ONE TOUCH ULTRA TEST test strip        No family history on file. History   Social History  . Marital Status: Married    Spouse Name: N/A    Number of Children: N/A  . Years of Education: N/A    Social History Main Topics  . Smoking status: Former Games developer  . Smokeless tobacco: None  . Alcohol Use: No  . Drug Use: No  . Sexually Active: None   Other Topics Concern  . None   Social History Narrative  . None   Review of Systems: Constitutional: Denies fever, chills, diaphoresis, appetite change and fatigue.  HEENT: + difficulty hearing b/l ears.  Denies photophobia, eye pain, redness, ear pain, congestion, sore throat, rhinorrhea, sneezing, mouth sores, trouble swallowing, neck pain, neck stiffness and tinnitus.   Respiratory: Denies SOB, DOE, cough, chest tightness,  and wheezing.   Cardiovascular: + b/l leg swelling.  Denies chest pain, palpitations. Gastrointestinal: Denies nausea, vomiting, abdominal pain, diarrhea, constipation, blood in stool and abdominal distention.  Genitourinary: Denies dysuria, urgency, frequency, hematuria, flank pain and difficulty urinating.  Musculoskeletal: hx of falls and lower extremity edema.  Slow gait, walks with cane.  Skin: Denies pallor, rash and wound.  Neurological: Denies dizziness, seizures, syncope, weakness, light-headedness, numbness and headaches.  Hematological: Denies adenopathy. Easy bruising, personal or family bleeding history  Psychiatric/Behavioral: + increasing dementia and suspicious thoughts towards wife. Denies suicidal ideation, confusion, nervousness, sleep disturbance and agitation  Objective:  Physical Exam: Filed Vitals:   06/25/12 1449  BP: 124/57  Pulse: 56  Temp: 99 F (37.2 C)  TempSrc: Oral  Height: 5\' 7"  (1.702 m)  Weight: 222 lb 6.4 oz (100.88 kg)  SpO2: 93%   Constitutional: Vital signs reviewed.  Patient is a well-developed and well-nourished male in no acute distress and cooperative with exam. Alert and oriented x3.  Head: Normocephalic and atraumatic Mouth: no erythema or exudates, MMM Eyes: PERRLA, EOMI, conjunctivae normal, No scleral icterus.  Neck: Supple, Trachea midline normal  ROM Cardiovascular: RRR, S1 normal, S2 normal, no MRG, pulses symmetric and intact bilaterally Pulmonary/Chest: CTAB, no wheezes, rales, or rhonchi Abdominal: Soft. Non-tender, distended, bowel sounds are normal, no masses, organomegaly, or guarding present.  GU: no CVA tenderness Musculoskeletal: Walks with cane, + b/l lower extremity edema, +1 pitting edema, non tender to palpation.  Hematology: no cervical, inginal, or axillary adenopathy.  Neurological: A&O x3, Strength weaker on left upper and lower extremity than right. cranial nerve II-XII are grossly intact.  Slow gait. sensory intact to light touch bilaterally.  Skin: Warm, dry and intact. No rash, cyanosis, or clubbing.  Psychiatric: Abnormal MMSE, 1/3 on recall three words, similar to what was in hospital.  Suspicious thoughts towards wife. Speech  normal.  Memory are normal.   Assessment & Plan:   Of note, was apparently started on Klor, depakote, and felexeril from SNF as per wife.  f/u BMP, advised to hold Klor at this time and f/u with PCP.

## 2012-06-25 NOTE — Assessment & Plan Note (Signed)
Stable.  Recently discharged from SNF two weeks ago.  Receiving PT and home health care at home.    Continue to monitor.   -Continue plavix

## 2012-06-25 NOTE — Assessment & Plan Note (Signed)
No recent falls noted since hospital admission, recently discharged from SNF.   Continue PT Continue walking with assistance, uses cane indoors

## 2012-06-25 NOTE — Assessment & Plan Note (Addendum)
His wife continues to complain of him having worsening dementia and suspicious thoughts in regards to her leaving him or cheating on him.  She sometimes doesn't feel safe.  We counseled him and her extensively and asked Ms. Ephraim to call clinic and let social worker know if she feels unsafe at any time and she agrees. Abnormal MMSE, with 1/3 on recall of words but AAOX3. Ruled out for demential by psychiatry during prior hospital admission.    -f/u social worker -continue Aricept -advised to follow up with neurology in regards to symptoms on next appointment scheduled for October 2013 -continue to monitor

## 2012-06-25 NOTE — Assessment & Plan Note (Signed)
Referred for colonoscopy today, foot exam done, influenza vaccine given  -needs prescription for shingles vaccine on next visit

## 2012-06-25 NOTE — Patient Instructions (Addendum)
Please follow up with social work  Please continue to work with PT/OT and walk with support device around the house  Continue current medications  Return to clinic for follow up appointment in 3 months with PCP  Please go for colonoscopy as referred during this visit and consider Shingles vaccine  For [name], please let us know if symptoms worsen or you need to speak to anyone else as we discussed.

## 2012-06-26 ENCOUNTER — Other Ambulatory Visit: Payer: Self-pay | Admitting: Internal Medicine

## 2012-06-26 ENCOUNTER — Encounter: Payer: Self-pay | Admitting: Gastroenterology

## 2012-06-26 DIAGNOSIS — F0390 Unspecified dementia without behavioral disturbance: Secondary | ICD-10-CM
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
## 2012-06-27 ENCOUNTER — Telehealth: Payer: Self-pay | Admitting: Licensed Clinical Social Worker
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
## 2012-06-30 ENCOUNTER — Telehealth: Payer: Self-pay | Admitting: *Deleted

## 2012-06-30 NOTE — Telephone Encounter (Signed)
Call from [facility] Social worker with [facility] - # [phone] They provide home health services She is asking for a Verbal Order to install life alert system. Pt is home alone during day.    VO was given.  Is that okay with you?

## 2012-07-01 NOTE — Telephone Encounter (Signed)
I agree with this course of action.  Thank you. 

## 2012-07-04 ENCOUNTER — Telehealth: Payer: Self-pay | Admitting: *Deleted

## 2012-07-04 NOTE — Telephone Encounter (Signed)
Received call from [facility], Ronald Lobo on voice mail. Wants pt's doctor to be aware his blood sugars have been running 200's-300's around Dinnertime (9p-10pm); otherwise they are ok. And is taking Lantus 20U daily.

## 2012-07-05 NOTE — Telephone Encounter (Signed)
Thank you for this note.  His last Hb A1C was 6.1.  I've had concerns about hypoglycemia in the past, so I'm reluctant to increase his insulin.  New hyperglycemia could be a sign of infection, though it sounds like no symptoms were reported.  I don't think a full infectious work-up is warranted just based on hyperglycemia, without fever or focal signs.  We will observe for now.

## 2012-07-07 NOTE — Telephone Encounter (Signed)
Thanks Dr Manson Passey- your response called to [facility], Ronald Lobo.

## 2012-07-24 ENCOUNTER — Ambulatory Visit (INDEPENDENT_AMBULATORY_CARE_PROVIDER_SITE_OTHER): Payer: Medicare Other | Admitting: Gastroenterology

## 2012-07-24 ENCOUNTER — Encounter: Payer: Self-pay | Admitting: Gastroenterology

## 2012-07-24 VITALS — BP 132/68 | HR 60 | Ht 67.0 in | Wt 221.0 lb

## 2012-07-24 DIAGNOSIS — K5900 Constipation, unspecified: Secondary | ICD-10-CM

## 2012-07-24 DIAGNOSIS — Z1211 Encounter for screening for malignant neoplasm of colon: Secondary | ICD-10-CM

## 2012-07-24 MED ORDER — PEG-KCL-NACL-NASULF-NA ASC-C 100 G PO SOLR: 1.0000 | Freq: Once | ORAL | Status: DC

## 2012-07-24 NOTE — Patient Instructions (Addendum)
You have been scheduled for a colonoscopy with propofol. Please follow written instructions given to you at your visit today.  Please pick up your prep kit at the pharmacy within the next 1-3 days. If you use inhalers (even only as needed), please bring them with you on the day of your procedure.  You will be contacted by our office prior to your procedure for directions on holding your Plavix.  If you do not hear from our office 1 week prior to your scheduled procedure, please call [phone] to discuss.   cc: Janalyn Harder, MD

## 2012-07-24 NOTE — Progress Notes (Signed)
History of Present Illness: This is a 71 year old male here today with his wife. He has had chronic problems with mild constipation that is well controlled with Colace daily or twice daily. Is no other colorectal complaints. He states he had a flexible sigmoidoscopy by Dr. Nila Nephew in 2004 that was unremarkable. He hass not previously had a  colonoscopy. Denies weight loss, abdominal pain, diarrhea, change in stool caliber, melena, hematochezia, nausea, vomiting, dysphagia, reflux symptoms, chest pain.  Review of Systems: Pertinent positive and negative review of systems were noted in the above HPI section. All other review of systems were otherwise negative.  Current Medications, Allergies, Past Medical History, Past Surgical History, Family History and Social History were reviewed in [employer] record.  Physical Exam: General: Well developed , well nourished, no acute distress Head: Normocephalic and atraumatic Eyes:  sclerae anicteric, EOMI Ears: Normal auditory acuity Mouth: No deformity or lesions Neck: Supple, no masses or thyromegaly Lungs: Clear throughout to auscultation Heart: Regular rate and rhythm; no murmurs, rubs or bruits Abdomen: Soft, non tender and non distended. No masses, hepatosplenomegaly or hernias noted. Normal Bowel sounds Rectal: deferred to colonoscopy Musculoskeletal: Symmetrical with no gross deformities  Skin: No lesions on visible extremities Pulses:  Normal pulses noted Extremities: No clubbing, cyanosis, edema or deformities noted Neurological: Alert oriented x 4, grossly nonfocal Cervical Nodes:  No significant cervical adenopathy Inguinal Nodes: No significant inguinal adenopathy Psychological:  Alert and cooperative. Normal mood and affect  Assessment and Recommendations:  1. Colorectal cancer screening. The risks, benefits and alternatives to a 5 day hold of Plavix were discussed with the patient and his wife and the patient  consents to proceed. Obtain clearance from his primary physician. We could perform colonoscopy while on Plavix however he would be at increased risk of bleeding with biopsies and polypectomy. Schedule colonoscopy. The risks, benefits, and alternatives to colonoscopy with possible biopsy and possible polypectomy were discussed with the patient and they consent to proceed.   2. Constipation, chronic. High fiber diet, increase water intake Colace daily to twice a day as needed.  3. Insulin-dependent diabetes mellitus.  4. History of a CVA and TIAs

## 2012-07-25 ENCOUNTER — Encounter: Payer: Self-pay | Admitting: Licensed Clinical Social Worker

## 2012-08-05 ENCOUNTER — Encounter: Payer: Medicare Other | Admitting: Gastroenterology

## 2012-08-06 ENCOUNTER — Telehealth: Payer: Self-pay | Admitting: *Deleted

## 2012-08-06 ENCOUNTER — Emergency Department (HOSPITAL_COMMUNITY)
Admission: EM | Admit: 2012-08-06 | Discharge: 2012-08-07 | Disposition: A | Discharge status: Home / Self Care | Payer: Medicare Other | Attending: Emergency Medicine | Admitting: Emergency Medicine

## 2012-08-06 ENCOUNTER — Emergency Department (HOSPITAL_COMMUNITY): Payer: Medicare Other

## 2012-08-06 ENCOUNTER — Encounter (HOSPITAL_COMMUNITY): Payer: Self-pay | Admitting: Emergency Medicine

## 2012-08-06 ENCOUNTER — Ambulatory Visit (HOSPITAL_COMMUNITY)
Admission: RE | Admit: 2012-08-06 | Discharge: 2012-08-06 | Disposition: A | Discharge status: Home / Self Care | Payer: Medicare Other | Attending: Psychiatry | Admitting: Psychiatry

## 2012-08-06 DIAGNOSIS — F39 Unspecified mood [affective] disorder: Secondary | ICD-10-CM | POA: Insufficient documentation

## 2012-08-06 DIAGNOSIS — Z794 Long term (current) use of insulin: Secondary | ICD-10-CM | POA: Insufficient documentation

## 2012-08-06 DIAGNOSIS — E119 Type 2 diabetes mellitus without complications: Secondary | ICD-10-CM | POA: Insufficient documentation

## 2012-08-06 DIAGNOSIS — R296 Repeated falls: Secondary | ICD-10-CM

## 2012-08-06 DIAGNOSIS — I10 Essential (primary) hypertension: Secondary | ICD-10-CM | POA: Insufficient documentation

## 2012-08-06 DIAGNOSIS — Z8739 Personal history of other diseases of the musculoskeletal system and connective tissue: Secondary | ICD-10-CM | POA: Insufficient documentation

## 2012-08-06 DIAGNOSIS — Z79899 Other long term (current) drug therapy: Secondary | ICD-10-CM | POA: Insufficient documentation

## 2012-08-06 DIAGNOSIS — I509 Heart failure, unspecified: Secondary | ICD-10-CM | POA: Insufficient documentation

## 2012-08-06 DIAGNOSIS — Z8673 Personal history of transient ischemic attack (TIA), and cerebral infarction without residual deficits: Secondary | ICD-10-CM | POA: Insufficient documentation

## 2012-08-06 DIAGNOSIS — E785 Hyperlipidemia, unspecified: Secondary | ICD-10-CM | POA: Insufficient documentation

## 2012-08-06 DIAGNOSIS — F329 Major depressive disorder, single episode, unspecified: Secondary | ICD-10-CM | POA: Insufficient documentation

## 2012-08-06 DIAGNOSIS — Z87891 Personal history of nicotine dependence: Secondary | ICD-10-CM | POA: Insufficient documentation

## 2012-08-06 DIAGNOSIS — F3289 Other specified depressive episodes: Secondary | ICD-10-CM | POA: Insufficient documentation

## 2012-08-06 DIAGNOSIS — Z9181 History of falling: Secondary | ICD-10-CM | POA: Insufficient documentation

## 2012-08-06 DIAGNOSIS — F22 Delusional disorders: Secondary | ICD-10-CM | POA: Insufficient documentation

## 2012-08-06 DIAGNOSIS — Z008 Encounter for other general examination: Secondary | ICD-10-CM | POA: Insufficient documentation

## 2012-08-06 DIAGNOSIS — Z8669 Personal history of other diseases of the nervous system and sense organs: Secondary | ICD-10-CM | POA: Insufficient documentation

## 2012-08-06 LAB — COMPREHENSIVE METABOLIC PANEL
ALT: 20 U/L (ref 0–53)
Albumin: 3.8 g/dL (ref 3.5–5.2)
Alkaline Phosphatase: 67 U/L (ref 39–117)
Chloride: 106 mEq/L (ref 96–112)
Glucose, Bld: 129 mg/dL — ABNORMAL HIGH (ref 70–99)
Potassium: 4.1 mEq/L (ref 3.5–5.1)
Sodium: 143 mEq/L (ref 135–145)
Total Protein: 7.2 g/dL (ref 6.0–8.3)

## 2012-08-06 LAB — CBC
Hemoglobin: 12.4 g/dL — ABNORMAL LOW (ref 13.0–17.0)
MCH: 26.4 pg (ref 26.0–34.0)
MCHC: 31.6 g/dL (ref 30.0–36.0)
RDW: 15.5 % (ref 11.5–15.5)

## 2012-08-06 LAB — RAPID URINE DRUG SCREEN, HOSP PERFORMED: Amphetamines: NOT DETECTED

## 2012-08-06 LAB — ETHANOL: Alcohol, Ethyl (B): <11 mg/dL (ref 0–11)

## 2012-08-06 MED ORDER — RISPERIDONE 0.5 MG PO TABS: 0.5000 mg | ORAL_TABLET | Freq: Every day | ORAL | Status: DC

## 2012-08-06 MED ORDER — CITALOPRAM HYDROBROMIDE 20 MG PO TABS: 20.0000 mg | ORAL_TABLET | Freq: Every day | ORAL | Status: DC

## 2012-08-06 MED ORDER — LISINOPRIL 20 MG PO TABS: 20.0000 mg | ORAL_TABLET | Freq: Every day | ORAL | Status: DC

## 2012-08-06 MED ORDER — SIMVASTATIN 40 MG PO TABS
40.0000 mg | ORAL_TABLET | Freq: Every day | ORAL | Status: DC
  Administered 2012-08-06: 40 mg via ORAL
  Filled 2012-08-06 (×2): qty 1

## 2012-08-06 MED ORDER — AMLODIPINE BESYLATE 10 MG PO TABS: 10.0000 mg | ORAL_TABLET | Freq: Every day | ORAL | Status: DC

## 2012-08-06 MED ORDER — FUROSEMIDE 80 MG PO TABS: 80.0000 mg | ORAL_TABLET | Freq: Every day | ORAL | Status: DC

## 2012-08-06 MED ORDER — LORAZEPAM 1 MG PO TABS: 1.0000 mg | ORAL_TABLET | Freq: Three times a day (TID) | ORAL | Status: DC | PRN

## 2012-08-06 MED ORDER — GABAPENTIN 300 MG PO CAPS
600.0000 mg | ORAL_CAPSULE | Freq: Three times a day (TID) | ORAL | Status: DC
  Administered 2012-08-06: 600 mg via ORAL
  Filled 2012-08-06: qty 2

## 2012-08-06 MED ORDER — DONEPEZIL HCL 10 MG PO TABS
10.0000 mg | ORAL_TABLET | Freq: Every day | ORAL | Status: DC
  Administered 2012-08-06: 10 mg via ORAL
  Filled 2012-08-06 (×2): qty 1

## 2012-08-06 MED ORDER — TERAZOSIN HCL 5 MG PO CAPS
5.0000 mg | ORAL_CAPSULE | Freq: Every day | ORAL | Status: DC
  Administered 2012-08-06: 5 mg via ORAL
  Filled 2012-08-06 (×2): qty 1

## 2012-08-06 MED ORDER — INSULIN GLARGINE 100 UNIT/ML ~~LOC~~ SOLN
20.0000 [IU] | Freq: Every day | SUBCUTANEOUS | Status: DC
  Administered 2012-08-06: 20 [IU] via SUBCUTANEOUS
  Filled 2012-08-06: qty 1

## 2012-08-06 MED ORDER — CLOPIDOGREL BISULFATE 75 MG PO TABS: 75.0000 mg | ORAL_TABLET | Freq: Every day | ORAL | Status: DC

## 2012-08-06 MED ORDER — CYCLOBENZAPRINE HCL 10 MG PO TABS: 5.0000 mg | ORAL_TABLET | Freq: Three times a day (TID) | ORAL | Status: DC | PRN

## 2012-08-06 MED ORDER — GABAPENTIN 600 MG PO TABS
600.0000 mg | ORAL_TABLET | Freq: Three times a day (TID) | ORAL | Status: DC
  Filled 2012-08-06: qty 1

## 2012-08-06 MED ORDER — CARVEDILOL 12.5 MG PO TABS: 12.5000 mg | ORAL_TABLET | Freq: Two times a day (BID) | ORAL | Status: DC

## 2012-08-06 NOTE — ED Notes (Signed)
Pt has already been accepted at [facility].  Just needs to be med cleared.

## 2012-08-06 NOTE — ED Notes (Signed)
Urinal and sample cup placed at bedside, sample requested

## 2012-08-06 NOTE — ED Provider Notes (Addendum)
History     CSN: [id number]  Arrival date & time 08/06/12  1616   First MD Initiated Contact with Patient 08/06/12 1817      Chief Complaint  Patient presents with  . Medical Clearance  . Paranoid    (Consider location/radiation/quality/duration/timing/severity/associated sxs/prior treatment) Patient is a 71 y.o. male presenting with altered mental status. The history is provided by the spouse (the pts spouse states the pt is paranoid and threatening to kill her). No language interpreter was used.  Altered Mental Status This is a new problem. The current episode started more than 2 days ago. The problem occurs constantly. The problem has not changed since onset.Pertinent negatives include no chest pain, no abdominal pain and no headaches. Nothing aggravates the symptoms. Nothing relieves the symptoms. The treatment provided no relief.    Past Medical History  Diagnosis Date  . Stroke     left thalamic infarct  . Diabetes mellitus   . Hypertension   . 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 TIA (transient ischemic attack)     multiple in past, most recently in 2011  . Sleep apnea   . CHF (congestive heart failure)   . Depression   . Hyperlipemia   . Arthritis     Past Surgical History  Procedure Date  . Knee arthroscopy     lt knee  . Shoulder arthroscopy w/ rotator cuff repair     rt shoulder  . Intracranial aneurysm repair     Family History  Problem Relation Age of Onset  . Colon cancer Neg Hx     History  Substance Use Topics  . Smoking status: Former Games developer  . Smokeless tobacco: Never Used   Comment: Quit smoking 30 yrs ago   . Alcohol Use: No      Review of Systems  Constitutional: Negative for fatigue.  HENT: Negative for congestion, sinus pressure and ear discharge.   Eyes: Negative for discharge.  Respiratory: Negative for cough.     Cardiovascular: Negative for chest pain.  Gastrointestinal: Negative for abdominal pain and diarrhea.  Genitourinary: Negative for frequency and hematuria.  Musculoskeletal: Negative for back pain.  Skin: Negative for rash.  Neurological: Negative for seizures and headaches.  Hematological: Negative.   Psychiatric/Behavioral: Positive for altered mental status. Negative for hallucinations.    Allergies  Pioglitazone and Rosiglitazone maleate  Home Medications   Current Outpatient Rx  Name Route Sig Dispense Refill  . AMLODIPINE BESYLATE 10 MG PO TABS Oral Take 10 mg by mouth daily.    Marland Kitchen CARVEDILOL 25 MG PO TABS Oral Take 12.5 mg by mouth 2 (two) times daily with a meal.    . CITALOPRAM HYDROBROMIDE 10 MG PO TABS Oral Take 20 mg by mouth daily.     Marland Kitchen CLOPIDOGREL BISULFATE 75 MG PO TABS Oral Take 75 mg by mouth daily.    . CYCLOBENZAPRINE HCL 5 MG PO TABS Oral Take 5 mg by mouth daily as needed. For muscle spasm.    . DONEPEZIL HCL 10 MG PO TABS Oral Take 10 mg by mouth at bedtime.    . EPLERENONE 50 MG PO TABS Oral Take 50 mg by mouth 2 (two) times daily.    . FUROSEMIDE 80 MG PO TABS Oral Take 80 mg by mouth daily.    Marland Kitchen GABAPENTIN 600 MG PO TABS Oral Take 600 mg by mouth  3 (three) times daily.    . INSULIN GLARGINE 100 UNIT/ML [place] SOLN Subcutaneous Inject 20 Units into the skin at bedtime. 10 mL 12  . LISINOPRIL 20 MG PO TABS Oral Take 20 mg by mouth daily.    Marland Kitchen SIMVASTATIN 40 MG PO TABS Oral Take 40 mg by mouth at bedtime.    . TERAZOSIN HCL 5 MG PO CAPS Oral Take 5 mg by mouth at bedtime.    . AMBULATORY NON FORMULARY MEDICATION  CPAP Machine Uses as directed    . ONETOUCH ULTRA BLUE VI STRP        BP 145/81  Pulse 58  Temp 98 F (36.7 C) (Oral)  Resp 16  SpO2 100%  Physical Exam  Constitutional: He is oriented to person, place, and time. He appears well-developed.  HENT:  Head: Normocephalic and atraumatic.  Eyes: Conjunctivae normal and EOM are normal. No scleral  icterus.  Neck: Neck supple. No thyromegaly present.  Cardiovascular: Normal rate and regular rhythm.  Exam reveals no gallop and no friction rub.   No murmur heard. Pulmonary/Chest: No stridor. He has no wheezes. He has no rales. He exhibits no tenderness.  Abdominal: He exhibits no distension. There is no tenderness. There is no rebound.  Musculoskeletal: Normal range of motion. He exhibits no edema.  Lymphadenopathy:    He has no cervical adenopathy.  Neurological: He is oriented to person, place, and time. Coordination normal.  Skin: No rash noted. No erythema.  Psychiatric:       Homicidal and paranoid    ED Course  Procedures (including critical care time)  Labs Reviewed  CBC - Abnormal; Notable for the following:    Hemoglobin 12.4 (*)     All other components within normal limits  COMPREHENSIVE METABOLIC PANEL - Abnormal; Notable for the following:    Glucose, Bld 129 (*)     GFR calc non Af Amer 58 (*)     GFR calc Af Amer 67 (*)     All other components within normal limits  ETHANOL  URINE RAPID DRUG SCREEN (HOSP PERFORMED)   Ct Head Wo Contrast  08/06/2012  *RADIOLOGY REPORT*  Clinical Data: The patient is referred for medical clearance.  The patient's wife states that he is paranoid.  CT HEAD WITHOUT CONTRAST  Technique:  Contiguous axial images were obtained from the base of the skull through the vertex without contrast.  Comparison: 05/04/2012.  Findings: There is no evidence of acute intracranial hemorrhage, infarction, subdural hematoma or shift of midline structures. There is mild atrophy and evidence of bilateral white matter hypoattenuation likely due to small vessel white matter ischemic change.  There are small lacunar infarctions in the periventricular white matter adjacent to the frontal horn of the left lateral ventricle and in the anterior limb of the right internal capsule. There is evidence of prior aneurysm clipping along the right in the region of the M1  segment of the middle cerebral artery.  The brain, ventricles and extra-axial spaces are otherwise normal.  Examination the skull base shows surgical changes on the right, clear paranasal sinuses and mastoid air cells and normal orbits and globes.  IMPRESSION: No acute intracranial abnormalities.  There are a number of chronic stable findings.  These include atrophy, small vessel white matter disease and bilateral lacunar infarctions.   Original Report Authenticated By: Mervin Hack, M.D.      1. Frequent falls   2. Type II or unspecified type diabetes mellitus without mention  of complication, not stated as uncontrolled     telepsych states pt can go home and take risperdal  MDM          Benny Lennert, MD 08/06/12 1928  Benny Lennert, MD 08/06/12 [phone]

## 2012-08-06 NOTE — ED Notes (Signed)
Wife is taking pt's belongings home.  Pt has been wanded and scrubbed.

## 2012-08-06 NOTE — Telephone Encounter (Signed)
Call from [place], medical social worker for [employer]. # [phone] They are requesting a VO to see pt once more. Pt has there Life Alert unit they want to convert to self pay on the unit or collect the unit.  VO given for this visit. Is this okay with you?

## 2012-08-06 NOTE — ED Notes (Signed)
Family at bedside.wife
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
## 2012-08-06 NOTE — Telephone Encounter (Signed)
That's perfect, thank you for giving this order for me.
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
## 2012-08-08 ENCOUNTER — Telehealth: Payer: Self-pay | Admitting: *Deleted

## 2012-08-08 NOTE — Telephone Encounter (Signed)
Thank you for this note.  Will continue to follow.

## 2012-08-08 NOTE — Telephone Encounter (Signed)
Social worker from [facility] calls and states pt's social work visits end today, will keep life alert, has re-applied for transportation and is getting food once a month from assistance. [name] is with him in the evening and at night

## 2012-08-19 ENCOUNTER — Telehealth: Payer: Self-pay

## 2012-08-19 NOTE — Telephone Encounter (Signed)
Received call form Marchelle Folks, CMA with [facility] stating pt is scheduled for endoscopic procedure on 11/15.  They are asking how long the pt may come off plavix prior to the procedure. Please advise.

## 2012-08-20 ENCOUNTER — Encounter: Payer: Self-pay | Admitting: Internal Medicine

## 2012-08-20 ENCOUNTER — Ambulatory Visit (INDEPENDENT_AMBULATORY_CARE_PROVIDER_SITE_OTHER): Payer: Medicare Other | Admitting: Internal Medicine

## 2012-08-20 VITALS — BP 118/58 | HR 71 | Temp 98.7°F | Ht 67.0 in | Wt 224.2 lb

## 2012-08-20 DIAGNOSIS — Z01818 Encounter for other preprocedural examination: Secondary | ICD-10-CM | POA: Insufficient documentation

## 2012-08-20 DIAGNOSIS — Z79899 Other long term (current) drug therapy: Secondary | ICD-10-CM

## 2012-08-20 DIAGNOSIS — E119 Type 2 diabetes mellitus without complications: Secondary | ICD-10-CM

## 2012-08-20 DIAGNOSIS — G3184 Mild cognitive impairment, so stated: Secondary | ICD-10-CM

## 2012-08-20 DIAGNOSIS — I10 Essential (primary) hypertension: Secondary | ICD-10-CM

## 2012-08-20 DIAGNOSIS — F329 Major depressive disorder, single episode, unspecified: Secondary | ICD-10-CM

## 2012-08-20 LAB — GLUCOSE, CAPILLARY: Glucose-Capillary: 211 mg/dL — ABNORMAL HIGH (ref 70–99)

## 2012-08-20 NOTE — Assessment & Plan Note (Signed)
BP well controlled, continue current regimen. 

## 2012-08-20 NOTE — Progress Notes (Signed)
HPI The patient is a 71 y.o. male with a history of prior CVA with residual left-sided weakness, HTN, HL, DM2, presenting for a follow-up visit.  The patient had previously noted some episodes of hypoglycemia, and his Lantus was decreased to 20 units.  The patient now notes no episodes of hypoglycemia, and glucometer review reveals a CBG range from 87-318, with a 9-month average of 181.  The patient's A1C today is 7.5.  He notes that he only takes Lantus on days when his blood sugars are >130, otherwise he does not take Lantus.  The patient's wife notes that he has had increased forgetfulness, describing that he forgets to take his medications some mornings, even though his wife sets his medications out on the counter for him in the morning.  She also notes that he lost his car keys several weeks ago due to forgetfulness, but has been blaming others in the household for this matter, accusing them of hiding the keys from him.  His wife notes he has been having increasing thoughts of people doing things "behind his back", and has accused her of having an affair on several occasions.  We started the patient on Aricept 05/21/12, with the thought that these symptoms of paranoia and memory impairment may represent early dementia, though with minimal improvement in the above symptoms.  The patient was seen by psychiatry in the [facility] ED, and prescribed Risperidone on 10/23, though again with minimal symptom improvement.  ROS: General: no fevers, chills, changes in weight, changes in appetite Skin: no rash HEENT: no blurry vision, hearing changes, sore throat Pulm: no dyspnea, coughing, wheezing CV: no chest pain, palpitations, shortness of breath Abd: no abdominal pain, nausea/vomiting, diarrhea/constipation GU: no dysuria, hematuria, polyuria Ext: no arthralgias, myalgias Neuro: no weakness, numbness, or tingling  Filed Vitals:   08/20/12 1546  BP: 118/58  Pulse: 71  Temp: 98.7 F (37.1 C)     PEX General: sitting in chair, responds to questions with few-word answers, does not voluntarily initiate conversation HEENT: pupils equal round and reactive to light, vision grossly intact, oropharynx clear and non-erythematous  Neck: supple, no lymphadenopathy Lungs: clear to ascultation bilaterally, normal work of respiration, no wheezes, rales, ronchi Heart: regular rate and rhythm, no murmurs, gallops, or rubs Abdomen: soft, non-tender, non-distended, normal bowel sounds Msk: no joint edema, warmth, or erythema Extremities: no cyanosis, clubbing, or edema Neurologic: alert & oriented X3, cranial nerves II-XII intact, strength grossly intact, sensation intact to light touch  Current Outpatient Prescriptions on File Prior to Visit  Medication Sig Dispense Refill  . AMBULATORY NON FORMULARY MEDICATION CPAP Machine Uses as directed      . amLODipine (NORVASC) 10 MG tablet Take 10 mg by mouth daily.      . carvedilol (COREG) 25 MG tablet Take 12.5 mg by mouth 2 (two) times daily with a meal.      . citalopram (CELEXA) 10 MG tablet Take 20 mg by mouth daily.       . clopidogrel (PLAVIX) 75 MG tablet Take 75 mg by mouth daily.      . cyclobenzaprine (FLEXERIL) 5 MG tablet Take 5 mg by mouth daily as needed. For muscle spasm.      . donepezil (ARICEPT) 10 MG tablet Take 10 mg by mouth at bedtime.      Marland Kitchen eplerenone (INSPRA) 50 MG tablet Take 50 mg by mouth 2 (two) times daily.      . furosemide (LASIX) 80 MG tablet Take 80  mg by mouth daily.      Marland Kitchen gabapentin (NEURONTIN) 600 MG tablet Take 600 mg by mouth 3 (three) times daily.      . insulin glargine (LANTUS SOLOSTAR) 100 UNIT/ML injection Inject 20 Units into the skin at bedtime.  10 mL  12  . lisinopril (PRINIVIL,ZESTRIL) 20 MG tablet Take 20 mg by mouth daily.      . ONE TOUCH ULTRA TEST test strip       . risperiDONE (RISPERDAL) 0.5 MG tablet Take 1 tablet (0.5 mg total) by mouth daily.  30 tablet  0  . simvastatin (ZOCOR) 40 MG  tablet Take 40 mg by mouth at bedtime.      Marland Kitchen terazosin (HYTRIN) 5 MG capsule Take 5 mg by mouth at bedtime.        Assessment/Plan

## 2012-08-20 NOTE — Assessment & Plan Note (Signed)
The patient's A1C has increased from 6.1 to 7.5 after decreasing his dose of Lantus.  However, he is not taking Lantus daily. -keep lantus at 20 Units -we discussed the importance of taking Lantus daily, regardless of your blood sugar -will recheck in 3 months, increase Lantus as indicated (though keeping in mind high risk for hypoglycemia)

## 2012-08-20 NOTE — Patient Instructions (Signed)
For your diabetes, continue taking Lantus, 20 units per day -it is important to take your Lantus insulin at the same time every day, no matter what your blood sugar is at the time.  We are referring you to psychiatry.  They will contact you to set up an appointment.  Please return for a follow-up visit in 3 months.

## 2012-08-20 NOTE — Telephone Encounter (Signed)
Thank you.  The patient has an appointment with me today.  We'll discuss the issue then, and I will document my response in my note.  We can then fax the note to [name].

## 2012-08-20 NOTE — Assessment & Plan Note (Signed)
The patient is scheduled to undergo routine screening colonoscopy in the near future.  Per ASGE guidelines, the patient is undergoing a low-risk procedure (diagnostic colonoscopy, including biopsies), and plavix can be continued throughout this period.  Our primary recommendation is to continue plavix.  If the patient's gastroenterologist feels strongly that plavix should be discontinued, based on the patient's characteristics or expected outcomes of the colonoscopy, the patient does not have a high risk condition for thromboembolic complication, and this medication could be stopped for 5-7 days before the procedure, with the understanding that the risk of thromboembolic complication will be greater than they would be if the medication were continued.

## 2012-08-20 NOTE — Assessment & Plan Note (Signed)
The patient's family has noted symptoms on the last few office visits of subtle personality changes, memory impairment, and paranoia/accusations towards family members.  These symptoms may represent the early stages of dementia.  Risperidone was started by Psychiatry at the [facility] ED on 07/2012.  Patient would likely benefit from establishing with outpatient psychiatry.  Patient is amenable to this. -referral sent to psychiatry.

## 2012-08-21 MED ORDER — PEG-KCL-NACL-NASULF-NA ASC-C 100 G PO SOLR: 1.0000 | Freq: Once | ORAL | Status: DC

## 2012-08-21 NOTE — Telephone Encounter (Signed)
Patient notified to hold Plavix 5 days before his procedure.

## 2012-08-21 NOTE — Telephone Encounter (Signed)
Since the procedure would be a colonoscopy with polypectomy about 30% of the time (given the expected rate of adenomatous polyps) I would prefer to hold Plavix for 5 days to reduce the risk of bleeding if a polypectomy is necessary.

## 2012-08-21 NOTE — Telephone Encounter (Signed)
Preoperative examination Janalyn Harder, MD 08/20/2012 5:50 PM Signed  The patient is scheduled to undergo routine screening colonoscopy in the near future. Per ASGE guidelines, the patient is undergoing a low-risk procedure (diagnostic colonoscopy, including biopsies), and plavix can be continued throughout this period. Our primary recommendation is to continue plavix. If the patient's gastroenterologist feels strongly that plavix should be discontinued, based on the patient's characteristics or expected outcomes of the colonoscopy, the patient does not have a high risk condition for thromboembolic complication, and this medication could be stopped for 5-7 days before the procedure, with the understanding that the risk of thromboembolic complication will be greater than they would be if the medication were continued.

## 2012-08-21 NOTE — Telephone Encounter (Signed)
Given the estimated risks of the procedure, I agree with this intervention.  The patient and I discussed the risks/benefits of stopping anticoagulation at our visit yesterday.  Thank you for your excellent care of this patient.

## 2012-08-26 ENCOUNTER — Telehealth: Payer: Self-pay | Admitting: Gastroenterology

## 2012-08-26 NOTE — Telephone Encounter (Signed)
Patient wants to know if he should stop his blood pressure medication also. Told him not to stop the blood pressure medication and that the only medication he needs to stop is Plavix. Pt verbalized understanding.

## 2012-08-27 ENCOUNTER — Other Ambulatory Visit: Payer: Self-pay | Admitting: *Deleted

## 2012-08-28 MED ORDER — CLOPIDOGREL BISULFATE 75 MG PO TABS: 75.0000 mg | ORAL_TABLET | Freq: Every day | ORAL | Status: DC

## 2012-08-29 ENCOUNTER — Encounter: Payer: Self-pay | Admitting: Gastroenterology

## 2012-08-29 ENCOUNTER — Ambulatory Visit (AMBULATORY_SURGERY_CENTER): Payer: Medicare Other | Admitting: Gastroenterology

## 2012-08-29 VITALS — BP 130/61 | HR 61 | Temp 99.1°F | Resp 14 | Ht 67.0 in | Wt 221.0 lb

## 2012-08-29 DIAGNOSIS — Z1211 Encounter for screening for malignant neoplasm of colon: Secondary | ICD-10-CM

## 2012-08-29 DIAGNOSIS — K5900 Constipation, unspecified: Secondary | ICD-10-CM

## 2012-08-29 DIAGNOSIS — D126 Benign neoplasm of colon, unspecified: Secondary | ICD-10-CM

## 2012-08-29 MED ORDER — SODIUM CHLORIDE 0.9 % IV SOLN: 500.0000 mL | INTRAVENOUS | Status: DC

## 2012-08-29 NOTE — Progress Notes (Signed)
Patient did not experience any of the following events: a burn prior to discharge; a fall within the facility; wrong site/side/patient/procedure/implant event; or a hospital transfer or hospital admission upon discharge from the facility. (G8907) Patient did not have preoperative order for IV antibiotic SSI prophylaxis. (G8918)  

## 2012-08-29 NOTE — Patient Instructions (Addendum)
Impressions/recommendations:  Polyp-handout given Hemorrhoids-handout given  Repeat colonoscopy pending pathology. You will receive a letter within 1-3 weeks.  Resume Plavix today.  YOU HAD AN ENDOSCOPIC PROCEDURE TODAY AT THE [facility]: Refer to the procedure report that was given to you for any specific questions about what was found during the examination.  If the procedure report does not answer your questions, please call your gastroenterologist to clarify.  If you requested that your care partner not be given the details of your procedure findings, then the procedure report has been included in a sealed envelope for you to review at your convenience later.  YOU SHOULD EXPECT: Some feelings of bloating in the abdomen. Passage of more gas than usual.  Walking can help get rid of the air that was put into your GI tract during the procedure and reduce the bloating. If you had a lower endoscopy (such as a colonoscopy or flexible sigmoidoscopy) you may notice spotting of blood in your stool or on the toilet paper. If you underwent a bowel prep for your procedure, then you may not have a normal bowel movement for a few days.  DIET: Your first meal following the procedure should be a light meal and then it is ok to progress to your normal diet.  A half-sandwich or bowl of soup is an example of a good first meal.  Heavy or fried foods are harder to digest and may make you feel nauseous or bloated.  Likewise meals heavy in dairy and vegetables can cause extra gas to form and this can also increase the bloating.  Drink plenty of fluids but you should avoid alcoholic beverages for 24 hours.  ACTIVITY: Your care partner should take you home directly after the procedure.  You should plan to take it easy, moving slowly for the rest of the day.  You can resume normal activity the day after the procedure however you should NOT DRIVE or use heavy machinery for 24 hours (because of the sedation  medicines used during the test).    SYMPTOMS TO REPORT IMMEDIATELY: A gastroenterologist can be reached at any hour.  During normal business hours, 8:30 AM to 5:00 PM Monday through Friday, call [phone].  After hours and on weekends, please call the GI answering service at [phone] who will take a message and have the physician on call contact you.   Following lower endoscopy (colonoscopy or flexible sigmoidoscopy):  Excessive amounts of blood in the stool  Significant tenderness or worsening of abdominal pains  Swelling of the abdomen that is new, acute  Fever of 100F or higher  FOLLOW UP: If any biopsies were taken you will be contacted by phone or by letter within the next 1-3 weeks.  Call your gastroenterologist if you have not heard about the biopsies in 3 weeks.  Our staff will call the home number listed on your records the next business day following your procedure to check on you and address any questions or concerns that you may have at that time regarding the information given to you following your procedure. This is a courtesy call and so if there is no answer at the home number and we have not heard from you through the emergency physician on call, we will assume that you have returned to your regular daily activities without incident.  SIGNATURES/CONFIDENTIALITY: You and/or your care partner have signed paperwork which will be entered into your electronic medical record.  These signatures attest to the fact  that that the information above on your After Visit Summary has been reviewed and is understood.  Full responsibility of the confidentiality of this discharge information lies with you and/or your care-partner.

## 2012-08-29 NOTE — Op Note (Addendum)
[facility] [address] [place], [address]   COLONOSCOPY PROCEDURE REPORT  PATIENT: Henry, Bates  MR#: [id number] BIRTHDATE: [birth year] , 71  yrs. old GENDER: Male ENDOSCOPIST: Meryl Dare, MD, [facility] REFERRED BY: Janalyn Harder, MD PROCEDURE DATE:  08/29/2012 PROCEDURE:   Colonoscopy with biopsy ASA CLASS:   Class III INDICATIONS:average risk screening. MEDICATIONS: MAC sedation, administered by CRNA and propofol (Diprivan) 130mg  IV DESCRIPTION OF PROCEDURE:   After the risks benefits and alternatives of the procedure were thoroughly explained, informed consent was obtained.  A digital rectal exam revealed no abnormalities of the rectum.   The LB [id number]  endoscope was introduced through the anus and advanced to the cecum, which was identified by both the appendix and ileocecal valve. No adverse events experienced.   The quality of the prep was good, using MoviPrep  The instrument was then slowly withdrawn as the colon was fully examined.  COLON FINDINGS: A sessile polyp measuring 5 mm in size was found in the transverse colon.  A polypectomy was performed with cold forceps.  The resection was complete and the polyp tissue was completely retrieved.   The colon was otherwise normal.  There was no diverticulosis, inflammation, polyps or cancers unless previously stated.  Retroflexed views revealed internal hemorrhoids. The time to cecum=3 minutes 09 seconds.  Withdrawal time=13 minutes 35 seconds.  The scope was withdrawn and the procedure completed.  COMPLICATIONS: There were no complications.  ENDOSCOPIC IMPRESSION: 1.   Sessile polyp measuring 5 mm in the transverse colon; polypectomy performed with cold forceps 2.   The colon was otherwise normal  RECOMMENDATIONS: 1.  Await pathology results 2.  Repeat colonoscopy in 5 years if polyp adenomatous; otherwise 10 years 3.  Resume Plavix today   eSigned:  Meryl Dare, MD, [facility]  08/29/2012 3:17 PM

## 2012-09-01 ENCOUNTER — Other Ambulatory Visit: Payer: Self-pay | Admitting: *Deleted

## 2012-09-01 ENCOUNTER — Telehealth: Payer: Self-pay | Admitting: *Deleted

## 2012-09-01 MED ORDER — GABAPENTIN 600 MG PO TABS: 600.0000 mg | ORAL_TABLET | Freq: Three times a day (TID) | ORAL | Status: DC

## 2012-09-01 NOTE — Telephone Encounter (Signed)
  Follow up Call-  Call back number 08/29/2012  Post procedure Call Back phone  # [phone]  Permission to leave phone message Yes     Patient questions:  Do you have a fever, pain , or abdominal swelling? no Pain Score  0 *  Have you tolerated food without any problems? yes  Have you been able to return to your normal activities? yes  Do you have any questions about your discharge instructions: Diet   no Medications  no Follow up visit  no  Do you have questions or concerns about your Care? no  Actions: * If pain score is 4 or above: No action needed, pain <4.  Information provided via wife.

## 2012-09-07 ENCOUNTER — Encounter: Payer: Self-pay | Admitting: Gastroenterology

## 2012-09-19 ENCOUNTER — Other Ambulatory Visit: Payer: Self-pay | Admitting: *Deleted

## 2012-09-21 MED ORDER — RISPERIDONE 0.5 MG PO TABS: 0.5000 mg | ORAL_TABLET | Freq: Every day | ORAL | Status: DC

## 2012-10-02 ENCOUNTER — Ambulatory Visit (INDEPENDENT_AMBULATORY_CARE_PROVIDER_SITE_OTHER): Payer: Medicare Other | Admitting: Internal Medicine

## 2012-10-02 ENCOUNTER — Encounter: Payer: Self-pay | Admitting: Internal Medicine

## 2012-10-02 ENCOUNTER — Telehealth: Payer: Self-pay | Admitting: Licensed Clinical Social Worker

## 2012-10-02 VITALS — BP 132/66 | HR 65 | Temp 97.4°F | Ht 67.0 in | Wt 225.7 lb

## 2012-10-02 DIAGNOSIS — I10 Essential (primary) hypertension: Secondary | ICD-10-CM

## 2012-10-02 DIAGNOSIS — G3184 Mild cognitive impairment, so stated: Secondary | ICD-10-CM

## 2012-10-02 DIAGNOSIS — E119 Type 2 diabetes mellitus without complications: Secondary | ICD-10-CM

## 2012-10-02 DIAGNOSIS — Z9189 Other specified personal risk factors, not elsewhere classified: Secondary | ICD-10-CM

## 2012-10-02 NOTE — Telephone Encounter (Signed)
Thank you for this note.  Problem addressed during today's visit.  Patient now has appointment scheduled 10/25/11.

## 2012-10-02 NOTE — Assessment & Plan Note (Signed)
BP at goal today.

## 2012-10-02 NOTE — Progress Notes (Signed)
HPI The patient is a 71 y.o. male with a history of DM2, HL, HTN, prior CVA, presenting for a follow-up visit, and to have paperwork filled out for [facility].  The patient notes improvement in his walking, after participating in PT.  The patient went to [facility] once to establish care, but has not been back to see a Psychiatrist.  We called [facility] during today's visit and got the patient an appointment 10/25/11.  The patient has undergone 2 road tests at the [facility] for driving.  His performance was reportedly "fair", but his driving has been restricted to during daylight hours, and only in his neighborhood.  They've asked that we fill out some paperwork about the patient's medical conditions.  The [facility] personnel reportedly observed that the patient seemed to not entirely know to operate the interior controls of the car, fiddling with the windshield wipers when trying to change gears.  The patient states he feels that he is safe to drive, the patient's wife states that she is unsure if he is.  ROS: General: no fevers, chills, changes in weight, changes in appetite Skin: no rash HEENT: no blurry vision, hearing changes, sore throat Pulm: no dyspnea, coughing, wheezing CV: no chest pain, palpitations, shortness of breath Abd: no abdominal pain, nausea/vomiting, diarrhea/constipation GU: no dysuria, hematuria, polyuria Ext: no arthralgias, myalgias Neuro: no weakness, numbness, or tingling  Filed Vitals:   10/02/12 1354  BP: 132/66  Pulse: 65  Temp: 97.4 F (36.3 C)    PEX General: alert, cooperative, and in no apparent distress HEENT: pupils equal round and reactive to light, vision grossly intact, oropharynx clear and non-erythematous  Neck: supple, no lymphadenopathy Lungs: clear to ascultation bilaterally, normal work of respiration, no wheezes, rales, ronchi Heart: regular rate and rhythm, no murmurs, gallops, or rubs Abdomen: soft, non-tender, non-distended, normal bowel  sounds Extremities: no cyanosis, clubbing, or edema.  Neurologic: alert & oriented X3, cranial nerves II-XII intact, MMSE 30/30, strength grossly intact, sensation intact to light touch  Current Outpatient Prescriptions on File Prior to Visit  Medication Sig Dispense Refill  . AMBULATORY NON FORMULARY MEDICATION CPAP Machine Uses as directed      . amLODipine (NORVASC) 10 MG tablet Take 10 mg by mouth daily.      . carvedilol (COREG) 25 MG tablet Take 12.5 mg by mouth 2 (two) times daily with a meal.      . citalopram (CELEXA) 10 MG tablet Take 20 mg by mouth daily.       . clopidogrel (PLAVIX) 75 MG tablet Take 1 tablet (75 mg total) by mouth daily.  30 tablet  11  . cyclobenzaprine (FLEXERIL) 5 MG tablet Take 5 mg by mouth daily as needed. For muscle spasm.      . donepezil (ARICEPT) 10 MG tablet Take 10 mg by mouth at bedtime.      Marland Kitchen eplerenone (INSPRA) 50 MG tablet Take 50 mg by mouth 2 (two) times daily.      . furosemide (LASIX) 80 MG tablet Take 80 mg by mouth daily.      Marland Kitchen gabapentin (NEURONTIN) 600 MG tablet Take 1 tablet (600 mg total) by mouth 3 (three) times daily.  90 tablet  5  . insulin glargine (LANTUS SOLOSTAR) 100 UNIT/ML injection Inject 20 Units into the skin at bedtime.  10 mL  12  . lisinopril (PRINIVIL,ZESTRIL) 20 MG tablet Take 20 mg by mouth daily.      . ONE TOUCH ULTRA TEST test strip       .  risperiDONE (RISPERDAL) 0.5 MG tablet Take 1 tablet (0.5 mg total) by mouth daily.  30 tablet  6  . simvastatin (ZOCOR) 40 MG tablet Take 40 mg by mouth at bedtime.      Marland Kitchen terazosin (HYTRIN) 5 MG capsule Take 5 mg by mouth at bedtime.        Assessment/Plan

## 2012-10-02 NOTE — Telephone Encounter (Signed)
CSW received call from nurse practitioner at [facility]  NP concerned regarding pt in need of referral to psychiatrist.  Pt has voiced hallucinations and spouse has continued to receive verbal aggression from pt.  This office has faxed recent office notes to [facility].  Pt is scheduled for appt today in [facility].  Pt EMR states pt was seen at [facility] 07/2012, then sent to [facility] for medical clearance.  At this time, EMR states spouse was to return call to Behavioral Health to schedule appt with [name].  CSW placed call to [facility] and left message to assist with getting [name] scheduled for a psychiatry appt with [name].

## 2012-10-02 NOTE — Assessment & Plan Note (Signed)
The patient has undergone 2 driving tests and has been restricted to driving during daylight around his neighborhood.  I've filled out DMV paperwork today listing the patient's medical conditions.  Ultimately, I think his ability to drive is best assessed by a driving exam, and I appreciate the DMV's assistance with this issue.

## 2012-10-02 NOTE — Assessment & Plan Note (Signed)
MMSE has improved to 30/30 since starting aricept.  Patient does not subjectively feel like aricept has been helping.  Patient's wife still notes lapses in memory, and paranoid thinking (perseverating on the idea that his wife is having an affair).   -The patient has an appointment with Psychiatry 10/25/11. -continue aricept

## 2012-10-02 NOTE — Patient Instructions (Signed)
General Instructions: For your back pain, please see the stretching exercises below.  Your paperwork for driving has been filled out.  Please follow-up with outpatient Psychiatry, as set up today.  Please return for a follow-up visit in 3 months.   Treatment Goals:  Goals (1 Years of Data) as of 10/02/2012          As of Today 08/29/12 08/29/12 08/29/12 08/29/12     Blood Pressure    . Blood Pressure < 140/90  132/66 130/61 136/63 115/50 115/50      Progress Toward Treatment Goals:  Treatment Goal 10/02/2012  Hemoglobin A1C at goal  Blood pressure at goal    Self Care Goals & Plans:  Self Care Goal 10/02/2012  Monitor my health bring my glucose meter and log to each visit    Home Blood Glucose Monitoring 10/02/2012  Check my blood sugar 2 times a day  When to check my blood sugar before breakfast; before dinner     Care Management & Community Referrals:  Referral 10/02/2012  Referrals made for care management support none needed     Back Exercises Back exercises help treat and prevent back injuries. The goal of back exercises is to increase the strength of your abdominal and back muscles and the flexibility of your back. These exercises should be started when you no longer have back pain. Back exercises include:  Pelvic Tilt. Lie on your back with your knees bent. Tilt your pelvis until the lower part of your back is against the floor. Hold this position 5 to 10 sec and repeat 5 to 10 times.  Knee to Chest. Pull first 1 knee up against your chest and hold for 20 to 30 seconds, repeat this with the other knee, and then both knees. This may be done with the other leg straight or bent, whichever feels better.  Sit-Ups or Curl-Ups. Bend your knees 90 degrees. Start with tilting your pelvis, and do a partial, slow sit-up, lifting your trunk only 30 to 45 degrees off the floor. Take at least 2 to 3 seconds for each sit-up. Do not do sit-ups with your knees out straight.  If partial sit-ups are difficult, simply do the above but with only tightening your abdominal muscles and holding it as directed.  Hip-Lift. Lie on your back with your knees flexed 90 degrees. Push down with your feet and shoulders as you raise your hips a couple inches off the floor; hold for 10 seconds, repeat 5 to 10 times.  Back arches. Lie on your stomach, propping yourself up on bent elbows. Slowly press on your hands, causing an arch in your low back. Repeat 3 to 5 times. Any initial stiffness and discomfort should lessen with repetition over time.  Shoulder-Lifts. Lie face down with arms beside your body. Keep hips and torso pressed to floor as you slowly lift your head and shoulders off the floor. Do not overdo your exercises, especially in the beginning. Exercises may cause you some mild back discomfort which lasts for a few minutes; however, if the pain is more severe, or lasts for more than 15 minutes, do not continue exercises until you see your caregiver. Improvement with exercise therapy for back problems is slow.  See your caregivers for assistance with developing a proper back exercise program. Document Released: 11/08/2004 Document Revised: 12/24/2011 Document Reviewed: 08/02/2011 [facility] Patient Information 2013 [place], [place].

## 2012-10-24 ENCOUNTER — Ambulatory Visit (INDEPENDENT_AMBULATORY_CARE_PROVIDER_SITE_OTHER): Payer: Medicare Other | Admitting: Psychiatry

## 2012-10-24 DIAGNOSIS — F329 Major depressive disorder, single episode, unspecified: Secondary | ICD-10-CM

## 2012-10-24 DIAGNOSIS — F0150 Vascular dementia without behavioral disturbance: Secondary | ICD-10-CM

## 2012-10-24 MED ORDER — RISPERIDONE 0.5 MG PO TABS: 1.0000 mg | ORAL_TABLET | Freq: Every day | ORAL | Status: DC

## 2012-10-24 NOTE — Progress Notes (Signed)
Psychiatric Assessment Adult  Patient Identification:  [name] Date of Evaluation:  10/24/2012 Chief Complaint: I am here because the doctor sent me History of Chief Complaint:  No chief complaint on file. this patient is a 72 year old African American married male who has a history of cerebral vascular disease including multiple right CVAs and an aneurysmal repair (2009) who presents claiming that he feels well. The patient was initially interviewed and then his wife joined [place]. While the patient denies being depressed his wife describes him as being very curable over the last one year. It seemed to have had an acute onset for reasons that are not clear. He was not related to any Delma Post present. The patient has sleep apnea and his sleep pattern is reversed. Hence he is up most of the night watching TV and sleeps during the day. His wife claims that he was watching pornography at one point he denies this. He says he watches comedy and forensic stories. Both agree that he is eating well has a normal amount of energy and is no evidence of worthlessness. He is not suicidal now or ever.  The patient has been retired for approximately 20 years. He describes himself having a marriage of 60 years when he lost his wife but cannot communicate nor negotiate. He initially tells me that he trusts his wife but his wife shares that this is not at all true. She infects claims that he's been unfaithful to her up until about 2006. She shares with me that her husband thinks that she is unfaithful and presently having an affair. He denies this to she also says approximately 2 months ago he makes physical threats to hurt her. He denies this. At one point apparently he was aggressive verbally with her and he was seen in this setting . He was then sent to [facility] where he was evaluated and apparently begun on Risperdal. The patient has 3 daughters and 11 grandchildren who he says are doing okay. He  claims that he does not worry about money while his wife says he worries about all the time. He's had no deaths in his family. Does say that his medical status really has not changed. The patient denies ever experiencing hallucinations. He denies being paranoid his wife describes this. For that agree that he's never had an episode of major clinical depression nor of mania. There is no evidence of an anxiety disorder. The patient has no past psychiatric history at all. He's never been in the hospital or seen a psychiatrist. Presently he takes 20 mg of Celexa, Aricept 10 mg and Risperdal 0.5 mg. In this interview the patient sat calmly and seem to be giving the indirect consistent history. His wife however clearly stated that the patient has been very suspicious, had periods of irritability and months ago was physically threatening to her. Although again noted he's never been violent.  HPI Review of Systems Physical Exam  Depressive Symptoms: depressed mood,  (Hypo) Manic Symptoms:   Elevated Mood:  No Irritable Mood:  yes Grandiosity:  No Distractibility:  No Labiality of Mood:  No Delusions:  No Hallucinations:  No Impulsivity:  No Sexually Inappropriate Behavior:  No Financial Extravagance:  No Flight of Ideas:  No  Anxiety Symptoms: Excessive Worry:  No Panic Symptoms:  No Agoraphobia:  No Obsessive Compulsive: No  Symptoms: None, Specific Phobias:  No Social Anxiety:  No  Psychotic Symptoms:  Hallucinations: No None Delusions:  Yes Paranoia:  Yes  Ideas of Reference:  No  PTSD Symptoms: Ever had a traumatic exposure:  No Had a traumatic exposure in the last month:  No Re-experiencing: No None Hypervigilance:  No Hyperarousal: No None Avoidance: No None  Traumatic Brain Injury: No   Past Psychiatric History: Diagnosis: none  Hospitalizations:   Outpatient Care:   Substance Abuse Care:   Self-Mutilation:   Suicidal Attempts:   Violent Behaviors:    Past  Medical History:   Past Medical History  Diagnosis Date  . Stroke     left thalamic infarct  . Diabetes mellitus   . Hypertension   . 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 TIA (transient ischemic attack)     multiple in past, most recently in 2011  . Sleep apnea   . CHF (congestive heart failure)   . Depression   . Hyperlipemia   . Arthritis    History of Loss of Consciousness:  No Seizure History:  No Cardiac History:  No Allergies:   Allergies  Allergen Reactions  . Pioglitazone Other (See Comments)    Edema   . Rosiglitazone Maleate Other (See Comments)    edema   Current Medications:  Current Outpatient Prescriptions  Medication Sig Dispense Refill  . AMBULATORY NON FORMULARY MEDICATION CPAP Machine Uses as directed      . amLODipine (NORVASC) 10 MG tablet Take 10 mg by mouth daily.      . carvedilol (COREG) 25 MG tablet Take 12.5 mg by mouth 2 (two) times daily with a meal.      . citalopram (CELEXA) 10 MG tablet Take 20 mg by mouth daily.       . clopidogrel (PLAVIX) 75 MG tablet Take 1 tablet (75 mg total) by mouth daily.  30 tablet  11  . cyclobenzaprine (FLEXERIL) 5 MG tablet Take 5 mg by mouth daily as needed. For muscle spasm.      . donepezil (ARICEPT) 10 MG tablet Take 10 mg by mouth at bedtime.      Marland Kitchen eplerenone (INSPRA) 50 MG tablet Take 50 mg by mouth 2 (two) times daily.      . furosemide (LASIX) 80 MG tablet Take 80 mg by mouth daily.      Marland Kitchen gabapentin (NEURONTIN) 600 MG tablet Take 1 tablet (600 mg total) by mouth 3 (three) times daily.  90 tablet  5  . insulin glargine (LANTUS SOLOSTAR) 100 UNIT/ML injection Inject 20 Units into the skin at bedtime.  10 mL  12  . lisinopril (PRINIVIL,ZESTRIL) 20 MG tablet Take 20 mg by mouth daily.      . ONE TOUCH ULTRA TEST test strip       . risperiDONE (RISPERDAL) 0.5 MG tablet Take 1 tablet (0.5 mg  total) by mouth daily.  30 tablet  6  . simvastatin (ZOCOR) 40 MG tablet Take 40 mg by mouth at bedtime.      Marland Kitchen terazosin (HYTRIN) 5 MG capsule Take 5 mg by mouth at bedtime.        Previous Psychotropic Medications:  Medication Dose   Risperdal 0.5 mg one at night, Celexa 20 mg, Aricept 10 mg                       Substance Abuse History in the last 12 months: Substance Age of 1st Use Last Use Amount Specific Type  Medical Consequences of Substance Abuse:   Legal Consequences of Substance Abuse:   Family Consequences of Substance Abuse:   Blackouts:   DT's:   Withdrawal Symptoms:   None  Social History: Current Place of Resididence  Magazine features editor of Birth:   Family Members:   Marital Status:  Married Children: 3  Sons:   Daughters:  Relationships:  Education:   Educational Problems/Performance:  Religious Beliefs/Practices:  History of Abuse:  Teacher, music History:   Legal History:  Hobbies/Interests:   Family History:   Family History  Problem Relation Age of Onset  . Colon cancer Neg Hx   . Diabetes Sister   . Diabetes Brother   . Diabetes Paternal Aunt     Mental Status Examination/Evaluation: Objective:  Appearance: Casual  Eye Contact::  Fair  Speech:  Clear and Coherent  Volume:  Normal  Mood:  flat  Affect:  Blunt  Thought Process:  Coherent  Orientation:  Full (Time, Place, and Person)  Thought Content:  WDL  Suicidal Thoughts:  No  Homicidal Thoughts:  No  Judgement:  Fair  Insight:  Fair  Psychomotor Activity:  Decreased  Akathisia:  No  Handed:  Right  AIMS (if indicated):    Assets:  Desire for Improvement    Laboratory/X-Ray Psychological Evaluation(s)        Assessment:  Axis I: Depressive Disorder secondary to general medical condition  AXIS I Depressive Disorder secondary to general medical condition  AXIS II Deferred  AXIS III Past Medical History  Diagnosis Date  . Stroke     left  thalamic infarct  . Diabetes mellitus   . Hypertension   . 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 TIA (transient ischemic attack)     multiple in past, most recently in 2011  . Sleep apnea   . CHF (congestive heart failure)   . Depression   . Hyperlipemia   . Arthritis      AXIS IV problems related to social environment  AXIS V 51-60 moderate symptoms   Treatment Plan/Recommendations:  Plan of Care: at this time will go ahead and minimize polypharmacy by discontinuing the Celexa which I do not believe has had any benefit. This is consistent with his wife's thoughts and she does not believe it's made any difference.For now I will go ahead and increase his Risperdal from 0.5 mg one at night to taking 2 at night. I recommended that he stay taking Aricept. This patient return to see me in approximately one month when at that time we will perform a Mini-Mental status exam.I will attempt to get any past scans of his brain. I suspect this patient has vascular dementia and associated irritability with it. 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 On her next visit we shall review the pros and cons of taking Risperdal or any antipsychotic medication in a person who may have vascular dementia. At this time I am not absolutely sure that he has a dementing disorder as his presentation was very clear. I'm not clear what component of their conflict is a marital issue as it's been very evident that he was unfaithful to her many years ago. Given that his wife describes that the patient was physically threatening to her in the context of his presentation I believe it is safer for to increase his Risperdal.Ultimately I will ask the adult  daughters to be seen as well. If in fact there is evidence that this is vascular dementia or my exam we will go over the pros and cons of the medications.  This patient therefore is still in a diagnostic evaluation.  Laboratory:    Psychotherapy:   Medications: Aricept 10 mg, Risperdal 0.5 mg, Celexa 20 mg  Routine PRN Medications:    Consultations:   Safety Concerns:    Other:      Lakeith Careaga, [place], MD 1/10/201410:47 AM

## 2012-11-28 ENCOUNTER — Ambulatory Visit (HOSPITAL_COMMUNITY): Payer: Self-pay | Admitting: Psychiatry

## 2012-12-24 ENCOUNTER — Ambulatory Visit (INDEPENDENT_AMBULATORY_CARE_PROVIDER_SITE_OTHER): Payer: PRIVATE HEALTH INSURANCE | Admitting: Psychiatry

## 2012-12-24 DIAGNOSIS — F4323 Adjustment disorder with mixed anxiety and depressed mood: Secondary | ICD-10-CM | POA: Insufficient documentation

## 2012-12-24 DIAGNOSIS — F39 Unspecified mood [affective] disorder: Secondary | ICD-10-CM

## 2012-12-24 MED ORDER — RISPERIDONE 0.5 MG PO TABS: 1.0000 mg | ORAL_TABLET | Freq: Every day | ORAL | Status: DC

## 2012-12-24 NOTE — Progress Notes (Signed)
[facility] MD Progress Note  12/24/2012 1:50 PM [name]  MRN:  [id number] Subjective:  Feels fine Diagnosis:  Axis I: Mood Disorder NOS Today the patient was seen first by himself. He has an expected Mini-Mental Status exam based upon age and education of 65. Today he scored a 28. The patient denies being depressed. He says he feels calm and comfortable and has no complaints. The patient denies being paranoid. He denies psychotic symptoms. His wife was also interviewed [name]. She claims he is better. She says he is less threatening. He still constantly accuses her been unfaithful. She really rates it is never been violent but been threatening which is lessened. Is not really oversedated at all. It is noted that he sleeps during the day and stays up and watches TV all night. He does appear moderately curable. The patient does all his basic ADLs. His institutional ADLs are met mostly by his wife. She does the bills and pills does all the cooking and shopping. However it is noted that this patient does washes and closed. Also noted according to the wife that over the years his cognitive abilities have not declined. His presentation is inconsistent with Alzheimer's process. I suspect his irritability and paranoia or rather excessive suspiciousness is related to a right frontal lesion. I do not believe this patient has a dementing process. ADL's:  Intact  Sleep: Good  Appetite:  Good  Suicidal Ideation:  no Homicidal Ideation:  no AEB (as evidenced by):  Psychiatric Specialty Exam: ROS  There were no vitals taken for this visit.There is no weight on file to calculate BMI.  General Appearance: Casual  Eye Contact::  Good  Speech:  Clear and Coherent  Volume:  Normal  Mood:  Euthymic  Affect:  Flat  Thought Process:  Goal Directed  Orientation:  Full (Time, Place, and Person)  Thought Content:  WDL  Suicidal Thoughts: no  Homicidal Thoughts:  No  Memory:  NA  Judgement:  Good  Insight:   Fair  Psychomotor Activity:  Decreased  Concentration:  Fair  Recall:  Good  Akathisia:  No  Handed:  Right  AIMS (if indicated):     Assets:  Desire for Improvement  Sleep:      Current Medications: Current Outpatient Prescriptions  Medication Sig Dispense Refill  . AMBULATORY NON FORMULARY MEDICATION CPAP Machine Uses as directed      . amLODipine (NORVASC) 10 MG tablet Take 10 mg by mouth daily.      . carvedilol (COREG) 25 MG tablet Take 12.5 mg by mouth 2 (two) times daily with a meal.      . citalopram (CELEXA) 10 MG tablet Take 20 mg by mouth daily.       . clopidogrel (PLAVIX) 75 MG tablet Take 1 tablet (75 mg total) by mouth daily.  30 tablet  11  . cyclobenzaprine (FLEXERIL) 5 MG tablet Take 5 mg by mouth daily as needed. For muscle spasm.      . donepezil (ARICEPT) 10 MG tablet Take 10 mg by mouth at bedtime.      Marland Kitchen eplerenone (INSPRA) 50 MG tablet Take 50 mg by mouth 2 (two) times daily.      . furosemide (LASIX) 80 MG tablet Take 80 mg by mouth daily.      Marland Kitchen gabapentin (NEURONTIN) 600 MG tablet Take 1 tablet (600 mg total) by mouth 3 (three) times daily.  90 tablet  5  . insulin glargine (LANTUS SOLOSTAR) 100 UNIT/ML  injection Inject 20 Units into the skin at bedtime.  10 mL  12  . lisinopril (PRINIVIL,ZESTRIL) 20 MG tablet Take 20 mg by mouth daily.      . ONE TOUCH ULTRA TEST test strip       . risperiDONE (RISPERDAL) 0.5 MG tablet Take 2 tablets (1 mg total) by mouth daily. Take 3 qhs  90 tablet  6  . simvastatin (ZOCOR) 40 MG tablet Take 40 mg by mouth at bedtime.      Marland Kitchen terazosin (HYTRIN) 5 MG capsule Take 5 mg by mouth at bedtime.       No current facility-administered medications for this visit.    Lab Results: No results found for this or any previous visit (from the past 48 hour(s)).  Physical Findings: AIMS:  , ,  ,  ,    CIWA:    COWS:     Treatment Plan Summary: At this time we will go ahead and increase his Risperdal from 1 mg to dose of 1.5 mg to  be taken night. Today the patient shows no evidence of tardive dyskinesia or EPS. Today we reviewed the pros and cons of this medicine and they agreed to take it as prescribed. To return to see me in 2 months we will again assess the degree of suspiciousness and possible paranoia of this patient. The patient has done fine off the Celexa.  Plan:  Medical Decision Making Problem Points:  Established problem, stable/improving (1) Data Points:  Review of new medications or change in dosage (2)  I certify that inpatient services furnished can reasonably be expected to improve the patient's condition.   PLOVSKY, GERALD IRVING 12/24/2012, 1:50 PM

## 2013-01-01 ENCOUNTER — Telehealth (HOSPITAL_COMMUNITY): Payer: Self-pay | Admitting: *Deleted

## 2013-01-01 ENCOUNTER — Other Ambulatory Visit: Payer: Self-pay | Admitting: *Deleted

## 2013-01-01 ENCOUNTER — Other Ambulatory Visit (HOSPITAL_COMMUNITY): Payer: Self-pay | Admitting: Psychiatry

## 2013-01-01 DIAGNOSIS — F4323 Adjustment disorder with mixed anxiety and depressed mood: Secondary | ICD-10-CM

## 2013-01-01 MED ORDER — RISPERIDONE 0.5 MG PO TABS: 1.5000 mg | ORAL_TABLET | Freq: Every day | ORAL | Status: DC

## 2013-01-01 MED ORDER — TERAZOSIN HCL 5 MG PO CAPS: 5.0000 mg | ORAL_CAPSULE | Freq: Every day | ORAL | Status: DC

## 2013-01-01 NOTE — Telephone Encounter (Signed)
Dr.Plovsky phoned office. Order for Risperidone clarified: Patient is to take Risperidone 0.5 mg, 3 tablets at HS. Pharmacy was notified by MD

## 2013-01-01 NOTE — Telephone Encounter (Signed)
Left demographics and clinical on  confidential VM at [facility] [phone] to request PA for [facility] for Risperidone 0.5 mg 3 tabs at HS (plan limit is 2 tabs daily)

## 2013-01-01 NOTE — Addendum Note (Signed)
Addended by: Tonny Bollman on: 01/01/2013 01:15 PM   Modules accepted: Orders

## 2013-01-01 NOTE — Telephone Encounter (Signed)
Received fax. Clarification needed for Risperidone RX sent 3/12. Directions :"Take 2 tablets (1 mg total) by mouth daily. Take 3 QHS". Pharmacy asks if pt is to have 2 tablets daily or 3 tablets daily. Message left for Dr.Plovsky @ [phone]

## 2013-01-05 ENCOUNTER — Encounter (HOSPITAL_COMMUNITY): Payer: Self-pay | Admitting: *Deleted

## 2013-01-05 NOTE — Progress Notes (Signed)
Recv'd VM: Notified by Rosalita Chessman at [facility] 01/02/13 - Risperidone 0.5 mg 3 tablets daily authorized 01/02/13 thru 01/02/14 Notified pt and [facility] pharmacy [phone] 01/05/13 @ 0930

## 2013-01-20 ENCOUNTER — Encounter: Payer: Self-pay | Admitting: Internal Medicine

## 2013-01-20 DIAGNOSIS — R269 Unspecified abnormalities of gait and mobility: Secondary | ICD-10-CM | POA: Insufficient documentation

## 2013-02-02 ENCOUNTER — Other Ambulatory Visit: Payer: Self-pay

## 2013-02-02 ENCOUNTER — Other Ambulatory Visit: Payer: Self-pay | Admitting: *Deleted

## 2013-02-02 DIAGNOSIS — R296 Repeated falls: Secondary | ICD-10-CM

## 2013-02-02 MED ORDER — CYCLOBENZAPRINE HCL 5 MG PO TABS: 5.0000 mg | ORAL_TABLET | Freq: Three times a day (TID) | ORAL | Status: DC | PRN

## 2013-02-03 MED ORDER — EPLERENONE 50 MG PO TABS: 50.0000 mg | ORAL_TABLET | Freq: Two times a day (BID) | ORAL | Status: DC

## 2013-02-11 ENCOUNTER — Ambulatory Visit (INDEPENDENT_AMBULATORY_CARE_PROVIDER_SITE_OTHER): Payer: Medicare Other | Admitting: Internal Medicine

## 2013-02-11 ENCOUNTER — Encounter: Payer: Self-pay | Admitting: Internal Medicine

## 2013-02-11 VITALS — BP 117/55 | HR 65 | Temp 98.9°F | Ht 67.0 in | Wt 208.3 lb

## 2013-02-11 DIAGNOSIS — E119 Type 2 diabetes mellitus without complications: Secondary | ICD-10-CM

## 2013-02-11 DIAGNOSIS — M25612 Stiffness of left shoulder, not elsewhere classified: Secondary | ICD-10-CM

## 2013-02-11 DIAGNOSIS — G3184 Mild cognitive impairment, so stated: Secondary | ICD-10-CM

## 2013-02-11 DIAGNOSIS — F0150 Vascular dementia without behavioral disturbance: Secondary | ICD-10-CM

## 2013-02-11 DIAGNOSIS — I10 Essential (primary) hypertension: Secondary | ICD-10-CM

## 2013-02-11 DIAGNOSIS — M25619 Stiffness of unspecified shoulder, not elsewhere classified: Secondary | ICD-10-CM

## 2013-02-11 LAB — GLUCOSE, CAPILLARY: Glucose-Capillary: 168 mg/dL — ABNORMAL HIGH (ref 70–99)

## 2013-02-11 MED ORDER — INSULIN GLARGINE 100 UNIT/ML ~~LOC~~ SOLN: 24.0000 [IU] | Freq: Every day | SUBCUTANEOUS | Status: DC

## 2013-02-11 MED ORDER — DONEPEZIL HCL 10 MG PO TABS: 10.0000 mg | ORAL_TABLET | Freq: Every day | ORAL | Status: DC

## 2013-02-11 NOTE — Progress Notes (Signed)
HPI The patient is a 72 y.o. male with a history of DM2, HTN, HL, prior CVA, and mild cognitive impairment (?vascular dementia vs sequelae of frontal lobe CVA), presenting for a routine follow-up visit.  The patient notes feeling that his left ear is "blocked up".  He notes decreased hearing in his left ear, with no pain or ringing, and no discharge.  No dizziness.  The patient also notes limitation in the range of motion of his left shoulder, present since his CVA (prior to 2007).  With extreme ROM, the patient notes shoulder pain, 3-4/10 in severity.  He has been taking no medications for the pain.  The patient has a history of DM.  His A1C today has increased mildly from 7.5 to 7.7.  He brings his glucometer today, which shows a range from 115-252, with most values in the mid-100's (glucometer unable to be downloaded).  He notes no episodes of hypoglycemia.  No blurry vision, polyuria, polydipsia  The patient's wife notes concerns that the patient occasionally forgets to take his medications.  His wife notes putting his daily medications in small plastic bags, though the patient notes difficulty opening these small bags due to the manual dexterity it requires.  ROS: General: no fevers, chills, changes in weight, changes in appetite Skin: no rash HEENT: no blurry vision, hearing changes, sore throat Pulm: no dyspnea, coughing, wheezing CV: no palpitations, shortness of breath Abd: no abdominal pain, nausea/vomiting, diarrhea/constipation GU: no dysuria, hematuria, polyuria Ext: see HPI Neuro: no weakness, numbness, or tingling  Filed Vitals:   02/11/13 1532  BP: 117/55  Pulse: 65  Temp: 98.9 F (37.2 C)    PEX General: alert, cooperative, and in no apparent distress HEENT: pupils equal round and reactive to light, vision grossly intact, oropharynx clear and non-erythematous. Left tympanic membrane clear and non-erythematous, with a moderate amount of cerumen in the external auditory  canal, not obstructing the tympanic membrane Neck: supple, no lymphadenopathy Lungs: clear to ascultation bilaterally, normal work of respiration, no wheezes, rales, ronchi Heart: regular rate and rhythm, no murmurs, gallops, or rubs Abdomen: soft, non-tender, non-distended, normal bowel sounds Extremities: Left shoulder with ROM limited to approximately 90 degrees of flexion, extension, and abduction.  No pain on palpation of his shoulder joint, but he notes pain at the limites of his ROM Neurologic: alert & oriented X3, cranial nerves II-XII intact, strength grossly intact, sensation intact to light touch  Current Outpatient Prescriptions on File Prior to Visit  Medication Sig Dispense Refill  . AMBULATORY NON FORMULARY MEDICATION CPAP Machine Uses as directed      . amLODipine (NORVASC) 10 MG tablet Take 10 mg by mouth daily.      . carvedilol (COREG) 25 MG tablet Take 12.5 mg by mouth 2 (two) times daily with a meal.      . citalopram (CELEXA) 10 MG tablet Take 20 mg by mouth daily.       . clopidogrel (PLAVIX) 75 MG tablet Take 1 tablet (75 mg total) by mouth daily.  30 tablet  11  . cyclobenzaprine (FLEXERIL) 5 MG tablet Take 1 tablet (5 mg total) by mouth 3 (three) times daily as needed.  90 tablet  6  . donepezil (ARICEPT) 10 MG tablet Take 10 mg by mouth at bedtime.      Marland Kitchen eplerenone (INSPRA) 50 MG tablet Take 1 tablet (50 mg total) by mouth 2 (two) times daily.  60 tablet  3  . furosemide (LASIX) 80 MG tablet  Take 80 mg by mouth daily.      Marland Kitchen gabapentin (NEURONTIN) 600 MG tablet Take 1 tablet (600 mg total) by mouth 3 (three) times daily.  90 tablet  5  . insulin glargine (LANTUS SOLOSTAR) 100 UNIT/ML injection Inject 20 Units into the skin at bedtime.  10 mL  12  . lisinopril (PRINIVIL,ZESTRIL) 20 MG tablet Take 20 mg by mouth daily.      . ONE TOUCH ULTRA TEST test strip       . risperiDONE (RISPERDAL) 0.5 MG tablet Take 3 tablets (1.5 mg total) by mouth at bedtime.  90 tablet  6   . simvastatin (ZOCOR) 40 MG tablet Take 40 mg by mouth at bedtime.      Marland Kitchen terazosin (HYTRIN) 5 MG capsule Take 1 capsule (5 mg total) by mouth at bedtime.  90 capsule  3   No current facility-administered medications on file prior to visit.    Assessment/Plan  Moderate ear wax -patient advised to avoid q-tips, and run his ear under warm water to remove cerumen

## 2013-02-11 NOTE — Assessment & Plan Note (Signed)
The patient notes limitation in ROM of his left shoulder since his CVA several years ago.  Examination of the shoulder confirms limitation in ROM.  Patient has benefited from PT in the past to increase ROM. -patient given stretching exercises to perform at home to increase mobility -if unsuccessful, patient may benefit from further PT

## 2013-02-11 NOTE — Progress Notes (Signed)
Case discussed with Dr. Brown at the time of the visit.  We reviewed the resident's history and exam and pertinent patient test results.  I agree with the assessment, diagnosis and plan of care documented in the resident's note. 

## 2013-02-11 NOTE — Assessment & Plan Note (Signed)
BP Readings from Last 3 Encounters:  02/11/13 117/55  10/02/12 132/66  08/29/12 130/61    Lab Results  Component Value Date   NA 143 08/06/2012   K 4.1 08/06/2012   CREATININE 1.24 08/06/2012    Assessment: Blood pressure control: controlled Progress toward BP goal:  at goal Comments: At goal  Plan: Medications:  continue current medications Educational resources provided:   Self management tools provided:   Other plans: Recheck at next visit

## 2013-02-11 NOTE — Assessment & Plan Note (Signed)
Lab Results  Component Value Date   HGBA1C 7.6 02/11/2013   HGBA1C 7.5 08/20/2012   HGBA1C 6.1* 05/05/2012     Assessment: Diabetes control: good control (HgbA1C at goal) Progress toward A1C goal:  at goal Comments: The patient's A1C is 7.6, with no episodes of hypoglycemia.  We will increase the patient's Lantus today.  Plan: Medications:  Increase Lantus from 20 to 24 units daily Home glucose monitoring: Frequency: 2 times a day Timing: before meals Instruction/counseling given: reminded to bring blood glucose meter & log to each visit Educational resources provided:   Self management tools provided:   Other plans: Return for follow-up visit in 3 months.

## 2013-02-11 NOTE — Patient Instructions (Addendum)
General Instructions: For your diabetes, we are increasing your insulin to 24 units once per day  For your ear wax, run your ear under warm water in the shower to loosen your ear wax and allow it to drain.  Avoid using Q-tips.  For your left shoulder pain, please see the exercises below to improve the flexibility and range of motion in your shoulder.  If you are able to perform these behaviors but continue to have limitations in your shoulder movement, we can try another round of physical therapy.  Please return for a follow-up visit in 3 months.  Treatment Goals:  Goals (1 Years of Data) as of 02/11/13         As of Today 10/02/12 08/29/12 08/29/12 08/29/12     Blood Pressure    . Blood Pressure < 140/90  117/55 132/66 130/61 136/63 115/50     Lifestyle    . Prevent Falls            Progress Toward Treatment Goals:  Treatment Goal 02/11/2013  Hemoglobin A1C at goal  Blood pressure at goal    Self Care Goals & Plans:  Self Care Goal 02/11/2013  Manage my medications take my medicines as prescribed  Monitor my health keep track of my blood glucose; bring my glucose meter and log to each visit  Be physically active -    Home Blood Glucose Monitoring 02/11/2013  Check my blood sugar 2 times a day  When to check my blood sugar before meals     Care Management & Community Referrals:  Referral 02/11/2013  Referrals made for care management support none needed      Shoulder Exercises EXERCISES  RANGE OF MOTION (ROM) AND STRETCHING EXERCISES These exercises may help you when beginning to rehabilitate your injury. Your symptoms may resolve with or without further involvement from your physician, physical therapist or athletic trainer. While completing these exercises, remember:   Restoring tissue flexibility helps normal motion to return to the joints. This allows healthier, less painful movement and activity.  An effective stretch should be held for at least 30  seconds.  A stretch should never be painful. You should only feel a gentle lengthening or release in the stretched tissue. ROM - Pendulum  Bend at the waist so that your right / left arm falls away from your body. Support yourself with your opposite hand on a solid surface, such as a table or a countertop.  Your right / left arm should be perpendicular to the ground. If it is not perpendicular, you need to lean over farther. Relax the muscles in your right / left arm and shoulder as much as possible.  Gently sway your hips and trunk so they move your right / left arm without any use of your right / left shoulder muscles.  Progress your movements so that your right / left arm moves side to side, then forward and backward, and finally, both clockwise and counterclockwise.  Complete ______10____ repetitions in each direction. Many people use this exercise to relieve discomfort in their shoulder as well as to gain range of motion. Complete this exercise ____2______ times per day. STRETCH  Flexion, Standing  Stand with good posture. With an underhand grip on your right / left hand and an overhand grip on the opposite hand, grasp a broomstick or cane so that your hands are a little more than shoulder-width apart.  Keeping your right / left elbow straight and shoulder muscles relaxed, push the  stick with your opposite hand to raise your right / left arm in front of your body and then overhead. Raise your arm until you feel a stretch in your right / left shoulder, but before you have increased shoulder pain.  Try to avoid shrugging your right / left shoulder as your arm rises by keeping your shoulder blade tucked down and toward your mid-back spine. Hold ____10______ seconds.  Slowly return to the starting position. Repeat exercise 10 times.  Complete this exercise ____2______ times per day. STRETCH - Internal Rotation  Place your right / left hand behind your back, palm-up.  Throw a towel or  belt over your opposite shoulder. Grasp the towel/belt with your right / left hand.  While keeping an upright posture, gently pull up on the towel/belt until you feel a stretch in the front of your right / left shoulder.  Avoid shrugging your right / left shoulder as your arm rises by keeping your shoulder blade tucked down and toward your mid-back spine.  Hold ____10 seconds______. Release the stretch by lowering your opposite hand. Repeat 10 times.  Complete this exercise ____2______ times per day. STRETCH - External Rotation and Abduction  Stagger your stance through a doorframe. It does not matter which foot is forward.  As instructed by your physician, physical therapist or athletic trainer, place your hands:  And forearms above your head and on the door frame.  And forearms at head-height and on the door frame.  At elbow-height and on the door frame.  Keeping your head and chest upright and your stomach muscles tight to prevent over-extending your low-back, slowly shift your weight onto your front foot until you feel a stretch across your chest and/or in the front of your shoulders.  Hold ____10______ seconds. Shift your weight to your back foot to release the stretch. Repeat _____10_____ times. Complete this stretch ___2_______ times per day.  STRENGTHENING EXERCISES  These exercises may help you when beginning to rehabilitate your injury. They may resolve your symptoms with or without further involvement from your physician, physical therapist or athletic trainer. While completing these exercises, remember:   Muscles can gain both the endurance and the strength needed for everyday activities through controlled exercises.  Complete these exercises as instructed by your physician, physical therapist or athletic trainer. Progress the resistance and repetitions only as guided.  You may experience muscle soreness or fatigue, but the pain or discomfort you are trying to eliminate  should never worsen during these exercises. If this pain does worsen, stop and make certain you are following the directions exactly. If the pain is still present after adjustments, discontinue the exercise until you can discuss the trouble with your clinician.  If advised by your physician, during your recovery, avoid activity or exercises which involve actions that place your right / left hand or elbow above your head or behind your back or head. These positions stress the tissues which are trying to heal. STRENGTH - Scapular Depression and Adduction  With good posture, sit on a firm chair. Supported your arms in front of you with pillows, arm rests or a table top. Have your elbows in line with the sides of your body.  Gently draw your shoulder blades down and toward your mid-back spine. Gradually increase the tension without tensing the muscles along the top of your shoulders and the back of your neck.  Hold for __________ seconds. Slowly release the tension and relax your muscles completely before completing the next repetition.  After you have practiced this exercise, remove the arm support and complete it in standing as well as sitting. Repeat __________ times. Complete this exercise __________ times per day.  STRENGTH - External Rotators  Secure a rubber exercise band/tubing to a fixed object so that it is at the same height as your right / left elbow when you are standing or sitting on a firm surface.  Stand or sit so that the secured exercise band/tubing is at your side that is not injured.  Bend your elbow 90 degrees. Place a folded towel or small pillow under your right / left arm so that your elbow is a few inches away from your side.  Keeping the tension on the exercise band/tubing, pull it away from your body, as if pivoting on your elbow. Be sure to keep your body steady so that the movement is only coming from your shoulder rotating.  Hold __________ seconds. Release the  tension in a controlled manner as you return to the starting position. Repeat __________ times. Complete this exercise __________ times per day.  STRENGTH - Supraspinatus  Stand or sit with good posture. Grasp a __________ weight or an exercise band/tubing so that your hand is "thumbs-up," like when you shake hands.  Slowly lift your right / left hand from your thigh into the air, traveling about 30 degrees from straight out at your side. Lift your hand to shoulder height or as far as you can without increasing any shoulder pain. Initially, many people do not lift their hands above shoulder height.  Avoid shrugging your right / left shoulder as your arm rises by keeping your shoulder blade tucked down and toward your mid-back spine.  Hold for __________ seconds. Control the descent of your hand as you slowly return to your starting position. Repeat __________ times. Complete this exercise __________ times per day.  STRENGTH - Shoulder Extensors  Secure a rubber exercise band/tubing so that it is at the height of your shoulders when you are either standing or sitting on a firm arm-less chair.  With a thumbs-up grip, grasp an end of the band/tubing in each hand. Straighten your elbows and lift your hands straight in front of you at shoulder height. Step back away from the secured end of band/tubing until it becomes tense.  Squeezing your shoulder blades together, pull your hands down to the sides of your thighs. Do not allow your hands to go behind you.  Hold for __________ seconds. Slowly ease the tension on the band/tubing as you reverse the directions and return to the starting position. Repeat __________ times. Complete this exercise __________ times per day.  STRENGTH - Scapular Retractors  Secure a rubber exercise band/tubing so that it is at the height of your shoulders when you are either standing or sitting on a firm arm-less chair.  With a palm-down grip, grasp an end of the  band/tubing in each hand. Straighten your elbows and lift your hands straight in front of you at shoulder height. Step back away from the secured end of band/tubing until it becomes tense.  Squeezing your shoulder blades together, draw your elbows back as you bend them. Keep your upper arm lifted away from your body throughout the exercise.  Hold __________ seconds. Slowly ease the tension on the band/tubing as you reverse the directions and return to the starting position. Repeat __________ times. Complete this exercise __________ times per day. STRENGTH  Scapular Depressors  Find a sturdy chair without wheels, such as a  from a dining room table.  Keeping your feet on the floor, lift your bottom from the seat and lock your elbows.  Keeping your elbows straight, allow gravity to pull your body weight down. Your shoulders will rise toward your ears.  Raise your body against gravity by drawing your shoulder blades down your back, shortening the distance between your shoulders and ears. Although your feet should always maintain contact with the floor, your feet should progressively support less body weight as you get stronger.  Hold __________ seconds. In a controlled and slow manner, lower your body weight to begin the next repetition. Repeat __________ times. Complete this exercise __________ times per day.  Document Released: 08/15/2005 Document Revised: 12/24/2011 Document Reviewed: 01/13/2009 [facility] Patient Information 2013 [place], [place].

## 2013-02-11 NOTE — Assessment & Plan Note (Signed)
The patient's wife continues to note forgetfulness in the patient, though she notes mild improvement after seeing Psychiatry and starting risperidone.  She asks whether she would qualify for an aide around the house to help with the care of her husband. -referral sent to social work to explore any community resources that may benefit her -greatly appreciate the excellent care by Dr. Donell Beers

## 2013-02-13 ENCOUNTER — Telehealth: Payer: Self-pay | Admitting: *Deleted

## 2013-02-13 DIAGNOSIS — E119 Type 2 diabetes mellitus without complications: Secondary | ICD-10-CM

## 2013-02-13 NOTE — Telephone Encounter (Signed)
May the pharm change pt's lantus to pens? If so please change medlist to reflect, thanks

## 2013-02-15 MED ORDER — INSULIN GLARGINE 100 UNIT/ML ~~LOC~~ SOLN: 24.0000 [IU] | Freq: Every day | SUBCUTANEOUS | Status: DC

## 2013-02-15 MED ORDER — INSULIN PEN NEEDLE 32G X 5 MM MISC: Status: DC

## 2013-02-15 NOTE — Telephone Encounter (Signed)
Yes, the patient may change Lantus to pen form.  Thank you.

## 2013-02-16 ENCOUNTER — Telehealth: Payer: Self-pay | Admitting: Licensed Clinical Social Worker

## 2013-02-16 ENCOUNTER — Encounter: Payer: Self-pay | Admitting: Licensed Clinical Social Worker

## 2013-02-16 NOTE — Telephone Encounter (Signed)
[name] was referred to CSW for community resources as spouse voiced need for personal care assistance in the home.  CSW placed call to [name] to assess current needs.  [name] voices concern for pt's decline in cognition.  Spouse states pt forgets to take his medication and spouse does not understand why this continues to occur.  [name] also states pt has urinated on himself while fully dressed, standing in the bathroom.  Spouse is concerned as to the continue decline and is anticipating an improvement.  [name] can not demonstrate the benefits or reason for pt's medication order for Aricept.  CSW encouraged [name] to discuss this with [name] and [name] during upcoming appt's.  CSW informed [name] currently [name] would not cover personal care in the home.  Spouse indicates pt is most likely over income for qualifying for Medicaid.  Discussed option of private paying for personal care and CSW will send listing of agencies in the mail.  Spouse declines desire for information on Adult Day Care.  Spouse inquired about "someone to come in and check on him".  CSW informed [name] would only cover home health services with a clear and specific goal.  Will forward to PCP to determine if there is a specific need/goal HH RN would be beneficial.  [name] does not qualify for community care management through [facility] or [facility].

## 2013-02-20 NOTE — Telephone Encounter (Addendum)
I called Mrs. Romanski to follow-up on this conversation.  She had questions regarding whether his condition was reversible.  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  The patient expressed understanding.  We will continue to discuss this at future clinic visits.

## 2013-02-25 ENCOUNTER — Ambulatory Visit (INDEPENDENT_AMBULATORY_CARE_PROVIDER_SITE_OTHER): Payer: No Typology Code available for payment source | Admitting: Psychiatry

## 2013-02-25 DIAGNOSIS — F4323 Adjustment disorder with mixed anxiety and depressed mood: Secondary | ICD-10-CM

## 2013-02-25 NOTE — Progress Notes (Signed)
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 There has been some changes unfortunately he did not been positive. The patient is followed twice. The patient's wife says he is having difficulty expressing in talking. She says she seems to be sleeping a lot more than usual. This is all probably increasing his Risperdal from 1 mg to a higher dose of 1.5 mg. The patient is still quite suspicious and according to the wife his of everyone. According to the patient only about his wife. This difference of opinions whether not he is getting along with his grandchildren. Wife wishes for him to get more mobile get out of the house and do things. The patient claims she's ready to do that but his wife is never home to take him. The patient has not been violence or caustic to her recently. He does seem somewhat more sedated in my view. The patient says that he is eating well and sleeping okay and denies being depressed. He denies being suicidal or homicidal. He distinctly denies any hallucinations. ADL's:  Intact  Sleep: Good  Appetite:  Good  Suicidal Ideation:  no Homicidal Ideation:  none AEB (as evidenced by):  Psychiatric Specialty Exam: ROS  There were no vitals taken for this visit.There is no weight on file to calculate BMI.  General Appearance: Casual  Eye Contact::  Good  Speech:  Clear and Coherent  Volume:  Normal  Mood:  Euthymic  Affect:  Blunt  Thought Process:  Goal Directed  Orientation:  Full (Time, Place, and Person)  Thought Content:  WDL  Suicidal Thoughts:  No  Homicidal Thoughts:  No  Memory:  nl  Judgement:  Good  Insight:  Fair  Psychomotor Activity:  Decreased  Concentration:  Fair  Recall:  Fair  Akathisia:  No  Handed:  Right  AIMS (if indicated):     Assets:  Desire for Improvement  Sleep:      Current  Medications: Current Outpatient Prescriptions  Medication Sig Dispense Refill  . AMBULATORY NON FORMULARY MEDICATION CPAP Machine Uses as directed      . amLODipine (NORVASC) 10 MG tablet Take 10 mg by mouth daily.      . carvedilol (COREG) 25 MG tablet Take 12.5 mg by mouth 2 (two) times daily with a meal.      . citalopram (CELEXA) 10 MG tablet Take 20 mg by mouth daily.       . clopidogrel (PLAVIX) 75 MG tablet Take 1 tablet (75 mg total) by mouth daily.  30 tablet  11  . cyclobenzaprine (FLEXERIL) 5 MG tablet Take 1 tablet (5 mg total) by mouth 3 (three) times daily as needed.  90 tablet  6  . donepezil (ARICEPT) 10 MG tablet Take 1 tablet (10 mg total) by mouth at bedtime.  30 tablet  5  . eplerenone (INSPRA) 50 MG tablet Take 1 tablet (50 mg total) by mouth 2 (two) times daily.  60 tablet  3  . furosemide (LASIX) 80 MG tablet Take 80 mg by mouth daily.      Marland Kitchen gabapentin (NEURONTIN) 600 MG tablet Take 1 tablet (600 mg total) by mouth 3 (three) times daily.  90 tablet  5  . insulin glargine (LANTUS) 100 UNIT/ML injection Inject 0.24 mLs (24 Units total) into the skin at bedtime.  15 mL  12  .  Insulin Pen Needle 32G X 5 MM MISC Use to inject insulin subcutaneous daily  100 each  11  . lisinopril (PRINIVIL,ZESTRIL) 20 MG tablet Take 20 mg by mouth daily.      . ONE TOUCH ULTRA TEST test strip       . risperiDONE (RISPERDAL) 0.5 MG tablet Take 3 tablets (1.5 mg total) by mouth at bedtime.  90 tablet  6  . simvastatin (ZOCOR) 40 MG tablet Take 40 mg by mouth at bedtime.      Marland Kitchen terazosin (HYTRIN) 5 MG capsule Take 1 capsule (5 mg total) by mouth at bedtime.  90 capsule  3   No current facility-administered medications for this visit.    Lab Results: No results found for this or any previous visit (from the past 48 hour(s)).  Physical Findings: AIMS:  , ,  ,  ,    CIWA:    COWS:     Treatment Plan Summary: At this time the patient will reduce his Risperdal from 1.5 mg to a dose of 1 mg  for one week and then 2.5 mg for one week and then he should discontinue it. 2 weeks later, approximately a month from now he'll return to see me for a reevaluation. Unfortunately there are concerns about financially being able for the better or safer medications. A reevaluate him in one month. The patient's wife was instructed to make contact with [place] if there are any dramatic changes in the next one month. She agreed to do this.I will make an attempt to contact his pharmacy to see if their insurance will cover Abilify.  Plan:  Medical Decision Making Problem Points:  Established problem, worsening (2) Data Points:  Review of new medications or change in dosage (2)  I certify that inpatient services furnished can reasonably be expected to improve the patient's condition.   [name] 02/25/2013, 1:44 PM

## 2013-03-15 ENCOUNTER — Emergency Department (HOSPITAL_COMMUNITY): Payer: Medicare Other

## 2013-03-15 ENCOUNTER — Observation Stay (HOSPITAL_COMMUNITY)
Admission: EM | Admit: 2013-03-15 | Discharge: 2013-03-18 | Disposition: A | Discharge status: Skilled Nursing Facility | Payer: Medicare Other | Attending: Internal Medicine | Admitting: Internal Medicine

## 2013-03-15 ENCOUNTER — Encounter (HOSPITAL_COMMUNITY): Payer: Self-pay | Admitting: *Deleted

## 2013-03-15 DIAGNOSIS — R29898 Other symptoms and signs involving the musculoskeletal system: Secondary | ICD-10-CM | POA: Insufficient documentation

## 2013-03-15 DIAGNOSIS — E785 Hyperlipidemia, unspecified: Secondary | ICD-10-CM | POA: Diagnosis present

## 2013-03-15 DIAGNOSIS — R609 Edema, unspecified: Secondary | ICD-10-CM | POA: Diagnosis present

## 2013-03-15 DIAGNOSIS — R531 Weakness: Secondary | ICD-10-CM

## 2013-03-15 DIAGNOSIS — G4733 Obstructive sleep apnea (adult) (pediatric): Secondary | ICD-10-CM

## 2013-03-15 DIAGNOSIS — R296 Repeated falls: Secondary | ICD-10-CM

## 2013-03-15 DIAGNOSIS — I509 Heart failure, unspecified: Secondary | ICD-10-CM | POA: Insufficient documentation

## 2013-03-15 DIAGNOSIS — E1149 Type 2 diabetes mellitus with other diabetic neurological complication: Secondary | ICD-10-CM | POA: Diagnosis present

## 2013-03-15 DIAGNOSIS — E1169 Type 2 diabetes mellitus with other specified complication: Secondary | ICD-10-CM | POA: Diagnosis present

## 2013-03-15 DIAGNOSIS — R5381 Other malaise: Principal | ICD-10-CM | POA: Insufficient documentation

## 2013-03-15 DIAGNOSIS — G3184 Mild cognitive impairment, so stated: Secondary | ICD-10-CM | POA: Diagnosis present

## 2013-03-15 DIAGNOSIS — R269 Unspecified abnormalities of gait and mobility: Secondary | ICD-10-CM | POA: Insufficient documentation

## 2013-03-15 DIAGNOSIS — I69959 Hemiplegia and hemiparesis following unspecified cerebrovascular disease affecting unspecified side: Secondary | ICD-10-CM | POA: Insufficient documentation

## 2013-03-15 DIAGNOSIS — F0390 Unspecified dementia without behavioral disturbance: Secondary | ICD-10-CM | POA: Insufficient documentation

## 2013-03-15 DIAGNOSIS — I10 Essential (primary) hypertension: Secondary | ICD-10-CM | POA: Diagnosis present

## 2013-03-15 DIAGNOSIS — F4323 Adjustment disorder with mixed anxiety and depressed mood: Secondary | ICD-10-CM

## 2013-03-15 DIAGNOSIS — Z9181 History of falling: Secondary | ICD-10-CM | POA: Insufficient documentation

## 2013-03-15 DIAGNOSIS — E1142 Type 2 diabetes mellitus with diabetic polyneuropathy: Secondary | ICD-10-CM | POA: Insufficient documentation

## 2013-03-15 DIAGNOSIS — E119 Type 2 diabetes mellitus without complications: Secondary | ICD-10-CM

## 2013-03-15 DIAGNOSIS — I5032 Chronic diastolic (congestive) heart failure: Secondary | ICD-10-CM | POA: Insufficient documentation

## 2013-03-15 DIAGNOSIS — F329 Major depressive disorder, single episode, unspecified: Secondary | ICD-10-CM | POA: Insufficient documentation

## 2013-03-15 DIAGNOSIS — IMO0002 Reserved for concepts with insufficient information to code with codable children: Secondary | ICD-10-CM | POA: Diagnosis present

## 2013-03-15 DIAGNOSIS — F3289 Other specified depressive episodes: Secondary | ICD-10-CM | POA: Insufficient documentation

## 2013-03-15 DIAGNOSIS — Z79899 Other long term (current) drug therapy: Secondary | ICD-10-CM | POA: Insufficient documentation

## 2013-03-15 DIAGNOSIS — M4802 Spinal stenosis, cervical region: Secondary | ICD-10-CM | POA: Insufficient documentation

## 2013-03-15 DIAGNOSIS — I6350 Cerebral infarction due to unspecified occlusion or stenosis of unspecified cerebral artery: Secondary | ICD-10-CM | POA: Diagnosis present

## 2013-03-15 LAB — URINALYSIS W MICROSCOPIC + REFLEX CULTURE
Glucose, UA: 100 mg/dL — AB
Leukocytes, UA: NEGATIVE
Protein, ur: >300 mg/dL — AB
Specific Gravity, Urine: 1.024 (ref 1.005–1.030)

## 2013-03-15 LAB — CBC WITH DIFFERENTIAL/PLATELET
Basophils Absolute: 0 10*3/uL (ref 0.0–0.1)
Lymphocytes Relative: 21 % (ref 12–46)
Neutro Abs: 4.9 10*3/uL (ref 1.7–7.7)
Neutrophils Relative %: 72 % (ref 43–77)
Platelets: 187 10*3/uL (ref 150–400)
RBC: 4.72 MIL/uL (ref 4.22–5.81)
RDW: 15.3 % (ref 11.5–15.5)
WBC: 6.8 10*3/uL (ref 4.0–10.5)

## 2013-03-15 LAB — COMPREHENSIVE METABOLIC PANEL
ALT: 19 U/L (ref 0–53)
AST: 21 U/L (ref 0–37)
Albumin: 3.9 g/dL (ref 3.5–5.2)
Calcium: 9.9 mg/dL (ref 8.4–10.5)
Creatinine, Ser: 1.16 mg/dL (ref 0.50–1.35)
GFR calc non Af Amer: 62 mL/min — ABNORMAL LOW (ref >90)
Sodium: 142 mEq/L (ref 135–145)
Total Protein: 7.3 g/dL (ref 6.0–8.3)

## 2013-03-15 NOTE — ED Notes (Signed)
Pt presents w/ c/o lower extremity weakness that occurred today while pt was doing laundry - pt is A&Ox4, in no acute distress, admits to pain in bilat knees d/t kneeling x1hr at onset of weakness. Pt denies any HA, blurred vision - facial expressions symmetrical and no slurred speech noted on assessment. Pt admits to hx of same when he was diagnosed w/ hypokalemia - pt denies any recent n/v/d.

## 2013-03-15 NOTE — ED Provider Notes (Signed)
History     CSN: [id number]  Arrival date & time 03/15/13  1929   First MD Initiated Contact with Patient 03/15/13 1944      Chief Complaint  Patient presents with  . Weakness    (Consider location/radiation/quality/duration/timing/severity/associated sxs/prior treatment) HPI Comments: 72 y.o. male who presents to the Er w/ the cc of "weakness". Per family, they state that over the past 2 weeks pt has been unable to ambulate at his baseline, he uses a cane, but they state he can't do tasks like he used to in the past, such as cutting meat and vegetables. They states that today, they found him on the ground, and stating he "couldn't get up". Pt denies fevers or chills. States he "feels weak".   Patient is a 72 y.o. male presenting with general illness. The history is provided by the patient and the spouse.  Illness Severity:  Mild Onset quality:  Gradual Duration:  2 weeks Timing:  Constant Progression:  Worsening Chronicity:  New Associated symptoms: no abdominal pain, no chest pain, no diarrhea, no fever, no headaches, no nausea, no rash, no shortness of breath and no wheezing     Past Medical History  Diagnosis Date  . Stroke     left thalamic infarct  . Diabetes mellitus   . Hypertension   . 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 TIA (transient ischemic attack)     multiple in past, most recently in 2011  . Sleep apnea   . CHF (congestive heart failure)   . Depression   . Hyperlipemia   . Arthritis     Past Surgical History  Procedure Laterality Date  . Knee arthroscopy      lt knee  . Shoulder arthroscopy w/ rotator cuff repair      rt shoulder  . Intracranial aneurysm repair    . Neck artery repaired from injury      Family History  Problem Relation Age of Onset  . Colon cancer Neg Hx   . Diabetes Sister   . Diabetes Brother   . Diabetes  Paternal Aunt     History  Substance Use Topics  . Smoking status: Former Games developer  . Smokeless tobacco: Never Used     Comment: Quit smoking 30 yrs ago   . Alcohol Use: No      Review of Systems  Constitutional: Positive for activity change. Negative for fever and chills.  HENT: Negative for drooling, mouth sores and neck pain.   Eyes: Negative for pain and discharge.  Respiratory: Negative for chest tightness, shortness of breath and wheezing.   Cardiovascular: Negative for chest pain.  Gastrointestinal: Negative for nausea, abdominal pain, diarrhea and constipation.  Genitourinary: Negative for dysuria, flank pain and difficulty urinating.  Musculoskeletal: Negative for back pain and arthralgias.  Skin: Negative for color change, pallor and rash.  Neurological: Positive for weakness. Negative for dizziness, light-headedness and headaches.  Psychiatric/Behavioral: Negative for behavioral problems, confusion and agitation.    Allergies  Pioglitazone and Rosiglitazone maleate  Home Medications   Current Outpatient Rx  Name  Route  Sig  Dispense  Refill  . AMBULATORY NON FORMULARY MEDICATION      CPAP Machine Uses as directed         . amLODipine (NORVASC) 10 MG tablet   Oral   Take 10 mg by mouth daily.         Marland Kitchen  carvedilol (COREG) 25 MG tablet   Oral   Take 12.5 mg by mouth 2 (two) times daily with a meal.         . clopidogrel (PLAVIX) 75 MG tablet   Oral   Take 1 tablet (75 mg total) by mouth daily.   30 tablet   11   . cyclobenzaprine (FLEXERIL) 5 MG tablet   Oral   Take 1 tablet (5 mg total) by mouth 3 (three) times daily as needed.   90 tablet   6   . donepezil (ARICEPT) 10 MG tablet   Oral   Take 1 tablet (10 mg total) by mouth at bedtime.   30 tablet   5   . eplerenone (INSPRA) 50 MG tablet   Oral   Take 1 tablet (50 mg total) by mouth 2 (two) times daily.   60 tablet   3   . furosemide (LASIX) 80 MG tablet   Oral   Take 80 mg by  mouth daily.         Marland Kitchen gabapentin (NEURONTIN) 600 MG tablet   Oral   Take 1 tablet (600 mg total) by mouth 3 (three) times daily.   90 tablet   5   . insulin glargine (LANTUS) 100 UNIT/ML injection   Subcutaneous   Inject 0.24 mLs (24 Units total) into the skin at bedtime.   15 mL   12     Please dispense in PEN form   . Insulin Pen Needle 32G X 5 MM MISC      Use to inject insulin subcutaneous daily   100 each   11   . lisinopril (PRINIVIL,ZESTRIL) 20 MG tablet   Oral   Take 20 mg by mouth daily.         . ONE TOUCH ULTRA TEST test strip               . simvastatin (ZOCOR) 40 MG tablet   Oral   Take 40 mg by mouth at bedtime.         Marland Kitchen terazosin (HYTRIN) 5 MG capsule   Oral   Take 1 capsule (5 mg total) by mouth at bedtime.   90 capsule   3     BP 150/81  Pulse 94  Temp(Src) 98.9 F (37.2 C) (Oral)  Resp 20  SpO2 92%  Physical Exam  Constitutional: He is oriented to person, place, and time. He appears well-developed. No distress.  HENT:  Head: Normocephalic.  Eyes: Pupils are equal, round, and reactive to light. Right eye exhibits no discharge. Left eye exhibits no discharge.  Neck: Neck supple. No tracheal deviation present.  Cardiovascular: Regular rhythm and intact distal pulses.   Pulmonary/Chest: Effort normal. No stridor. No respiratory distress. He has no wheezes.  Abdominal: Soft. He exhibits no distension. There is no tenderness. There is no rebound.  Musculoskeletal: Normal range of motion. He exhibits no tenderness.  Neurological: He is alert and oriented to person, place, and time. No cranial nerve deficit.  When ambulating pt in the room, he needs significant assistance. He is unable to stand on his own -- he needs support. This is not baseline for patient per family.   Skin: Skin is warm. No rash noted. He is not diaphoretic. No erythema.    ED Course  Procedures (including critical care time)  Labs Reviewed  BASIC METABOLIC  PANEL  CBC WITH DIFFERENTIAL  URINALYSIS, ROUTINE W REFLEX MICROSCOPIC   Dg Chest 2 View  03/15/2013   *RADIOLOGY REPORT*  Clinical Data: Weakness  CHEST - 2 VIEW  Comparison: 05/04/2012  Findings: Linear left lower lobe atelectasis or scarring noted. Lung volumes are low with crowding of the bronchovascular markings, but slightly improved since the prior exam.  Cardiac leads obscure detail.  Heart size upper limits of normal.  No pleural effusion.  IMPRESSION: Low volume exam with crowding of the bronchovascular markings but no acute finding allowing for this. If the patient's symptoms continue, consider PA and lateral chest radiographs obtained at full inspiration when the patient is clinically able.   Original Report Authenticated By: Christiana Pellant, M.D.   Ct Head Wo Contrast  03/15/2013   *RADIOLOGY REPORT*  Clinical Data: Acute onset of weakness; slumped into kneeling position.  CT HEAD WITHOUT CONTRAST  Technique:  Contiguous axial images were obtained from the base of the skull through the vertex without contrast.  Comparison: CT of the head performed 08/06/2012, and MRI of the brain performed 05/05/2012  Findings: There is no evidence of acute infarction, mass lesion, or intra- or extra-axial hemorrhage on CT.  Prominence of the ventricles and sulci reflects mild cortical volume loss.  Cerebellar atrophy is noted.  Diffuse periventricular and subcortical white matter change likely reflects small vessel ischemic microangiopathy.  Chronic ischemic change is noted at the right basal ganglia and left anterior corona radiata. Postoperative change is seen at the right MCA circulation.  The brainstem and fourth ventricle are within normal limits.  The cerebral hemispheres demonstrate grossly normal gray-white differentiation.  No mass effect or midline shift is seen.  There is no evidence of fracture; a right frontoparietal craniotomy flap is noted, with overlying soft tissue scarring.  The visualized  portions of the orbits are within normal limits.  The paranasal sinuses and mastoid air cells are well-aerated.  No significant soft tissue abnormalities are seen.  IMPRESSION:  1.  No acute intracranial pathology seen on CT. 2.  Mild cortical volume loss and diffuse small vessel ischemic microangiopathy. 3.  Chronic ischemic change at the right basal ganglia and left anterior corona radiata. 4.  Postoperative changes noted, with right frontoparietal craniotomy flap.   Original Report Authenticated By: Tonia Ghent, M.D.     MDM  Will check cbc, metabolites. Will get CT head with hx of recent falls and family stating that pt is not walking at his baseline. Further, suspect that depression might be contributing to pt's overall symptoms, as family states he has been particularly withdrawn since his brother died last week.   Labs and CT imaging do not show clear etiology of why pt is having weakness and why he was found down on the ground by family. Will admit pt internal medicine teaching service as he will need MRI in am w/ worsening gait.   1. Weakness            Bernadene Person, MD 03/15/13 2257

## 2013-03-15 NOTE — ED Notes (Signed)
Per EMS - pt was doing laundry when he had an acute onset of weakness - pt states lower extremities got weak and he slumped down into a kneeling position, pt was in that position approx 1hr d/t being unable to get up. Pt c/o pain in bilat knees - pt's daughter states pt has had this happen in the past and was diagnosed w/ hypokalemia at that time. Pt A&Ox4 on arrival w/o any gross neuro deficits w/ exception of lower extremity weakness.

## 2013-03-15 NOTE — ED Provider Notes (Addendum)
72 year old male complains of his knee is feeling weak today. However, his wife states that the real problem is that he's been much more somnolent than normal. He does have a stroke with residual left hemiparesthesias and they relate that he went to his brothers funeral one week ago and was not able to walk on had to be in a wheelchair. Today, he is noted to be somnolent on exam. He is afebrile. Lungs are clear and heart has regular rate and rhythm. There is mild left hemiparesthesias and trace pedal edema. Workup is initiated to look for electrolyte imbalance and occult infection.   Date: 03/15/2013  Rate: 93  Rhythm: normal sinus rhythm  QRS Axis: left  Intervals: normal  ST/T Wave abnormalities: normal  Conduction Disutrbances:right bundle branch block  Narrative Interpretation: Right bundle branch block, left axis deviation. When compared with ECG of 05/04/2012, no significant changes are seen  Old EKG Reviewed: unchanged    I saw and evaluated the patient, reviewed the resident's note and I agree with the findings and plan.   Dione Booze, MD 03/15/13 2137  Dione Booze, MD 03/16/13 [phone]

## 2013-03-15 NOTE — ED Notes (Signed)
Old and New EKG given to Dr Glick 

## 2013-03-15 NOTE — ED Notes (Signed)
Patient transported to CT 

## 2013-03-15 NOTE — ED Notes (Signed)
Pt aware he needs a urine sample. 

## 2013-03-16 ENCOUNTER — Observation Stay (HOSPITAL_COMMUNITY): Payer: Medicare Other

## 2013-03-16 DIAGNOSIS — R319 Hematuria, unspecified: Secondary | ICD-10-CM | POA: Insufficient documentation

## 2013-03-16 DIAGNOSIS — I10 Essential (primary) hypertension: Secondary | ICD-10-CM

## 2013-03-16 DIAGNOSIS — R609 Edema, unspecified: Secondary | ICD-10-CM

## 2013-03-16 DIAGNOSIS — G4733 Obstructive sleep apnea (adult) (pediatric): Secondary | ICD-10-CM

## 2013-03-16 DIAGNOSIS — F4323 Adjustment disorder with mixed anxiety and depressed mood: Secondary | ICD-10-CM

## 2013-03-16 DIAGNOSIS — Z9181 History of falling: Secondary | ICD-10-CM

## 2013-03-16 DIAGNOSIS — E119 Type 2 diabetes mellitus without complications: Secondary | ICD-10-CM

## 2013-03-16 DIAGNOSIS — R531 Weakness: Secondary | ICD-10-CM | POA: Diagnosis present

## 2013-03-16 LAB — GLUCOSE, CAPILLARY
Glucose-Capillary: 166 mg/dL — ABNORMAL HIGH (ref 70–99)
Glucose-Capillary: 251 mg/dL — ABNORMAL HIGH (ref 70–99)

## 2013-03-16 LAB — VITAMIN B12: Vitamin B-12: 763 pg/mL (ref 211–911)

## 2013-03-16 LAB — TSH: TSH: 2.077 u[IU]/mL (ref 0.350–4.500)

## 2013-03-16 LAB — CK: Total CK: 382 U/L — ABNORMAL HIGH (ref 7–232)

## 2013-03-16 MED ORDER — SODIUM CHLORIDE 0.9 % IJ SOLN
3.0000 mL | Freq: Two times a day (BID) | INTRAMUSCULAR | Status: DC
  Administered 2013-03-16 – 2013-03-18 (×3): 3 mL via INTRAVENOUS

## 2013-03-16 MED ORDER — FUROSEMIDE 80 MG PO TABS
80.0000 mg | ORAL_TABLET | Freq: Every day | ORAL | Status: DC
Start: 2013-03-16 — End: 2013-03-18
  Administered 2013-03-16 – 2013-03-18 (×3): 80 mg via ORAL
  Filled 2013-03-16 (×3): qty 1

## 2013-03-16 MED ORDER — SODIUM CHLORIDE 0.9 % IJ SOLN
3.0000 mL | INTRAMUSCULAR | Status: DC | PRN
  Administered 2013-03-17: 3 mL via INTRAVENOUS

## 2013-03-16 MED ORDER — AMLODIPINE BESYLATE 10 MG PO TABS
10.0000 mg | ORAL_TABLET | Freq: Every day | ORAL | Status: DC
  Administered 2013-03-16 – 2013-03-18 (×3): 10 mg via ORAL
  Filled 2013-03-16 (×3): qty 1

## 2013-03-16 MED ORDER — ATORVASTATIN CALCIUM 20 MG PO TABS
20.0000 mg | ORAL_TABLET | Freq: Every day | ORAL | Status: DC
  Administered 2013-03-16 – 2013-03-17 (×2): 20 mg via ORAL
  Filled 2013-03-16 (×3): qty 1

## 2013-03-16 MED ORDER — SIMVASTATIN 40 MG PO TABS: 40.0000 mg | ORAL_TABLET | Freq: Every day | ORAL | Status: DC

## 2013-03-16 MED ORDER — CLOPIDOGREL BISULFATE 75 MG PO TABS
75.0000 mg | ORAL_TABLET | Freq: Every day | ORAL | Status: DC
  Administered 2013-03-16 – 2013-03-18 (×3): 75 mg via ORAL
  Filled 2013-03-16 (×3): qty 1

## 2013-03-16 MED ORDER — INSULIN GLARGINE 100 UNIT/ML ~~LOC~~ SOLN
24.0000 [IU] | Freq: Every day | SUBCUTANEOUS | Status: DC
  Administered 2013-03-16 – 2013-03-17 (×3): 24 [IU] via SUBCUTANEOUS
  Filled 2013-03-16 (×4): qty 0.24

## 2013-03-16 MED ORDER — SPIRONOLACTONE 25 MG PO TABS
25.0000 mg | ORAL_TABLET | Freq: Every day | ORAL | Status: DC
  Administered 2013-03-16 – 2013-03-18 (×3): 25 mg via ORAL
  Filled 2013-03-16 (×3): qty 1

## 2013-03-16 MED ORDER — SODIUM CHLORIDE 0.9 % IJ SOLN
3.0000 mL | Freq: Two times a day (BID) | INTRAMUSCULAR | Status: DC
  Administered 2013-03-18: 3 mL via INTRAVENOUS

## 2013-03-16 MED ORDER — SODIUM CHLORIDE 0.9 % IV SOLN: 250.0000 mL | INTRAVENOUS | Status: DC | PRN

## 2013-03-16 MED ORDER — ENOXAPARIN SODIUM 40 MG/0.4ML ~~LOC~~ SOLN
40.0000 mg | SUBCUTANEOUS | Status: DC
  Administered 2013-03-16 – 2013-03-18 (×3): 40 mg via SUBCUTANEOUS
  Filled 2013-03-16 (×3): qty 0.4

## 2013-03-16 MED ORDER — LISINOPRIL 20 MG PO TABS
20.0000 mg | ORAL_TABLET | Freq: Every day | ORAL | Status: DC
  Administered 2013-03-16 – 2013-03-18 (×3): 20 mg via ORAL
  Filled 2013-03-16 (×3): qty 1

## 2013-03-16 MED ORDER — CARVEDILOL 12.5 MG PO TABS
12.5000 mg | ORAL_TABLET | Freq: Two times a day (BID) | ORAL | Status: DC
  Administered 2013-03-16 – 2013-03-18 (×5): 12.5 mg via ORAL
  Filled 2013-03-16 (×7): qty 1

## 2013-03-16 NOTE — Progress Notes (Signed)
RT set up cpap for patient on auto 14cmH20 max and 7cmH20 min via full face mask. Patient says he can put cpap on when ready. Rt will continue to monitor.

## 2013-03-16 NOTE — Consult Note (Signed)
NEURO HOSPITALIST CONSULT NOTE    Reason for Consult: gait instability  HPI:                                                                                                                                          [name] is an 72 y.o. 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  Per chart his wife states he has chronic gait and balance issues in addition to significant memory and ADL decline.  Per recent note patient continues to forget to take his medications.  Recently patients wife has asked for home care as she cannot fully care for patient at home.   In speaking tot he patient he recalls getting up and going down stairs to change the laundry. Once he stood up he noted bilateral knees "were weak and felt shaky". He was able to get down stairs but do to his leg weakness he eased himself to the floor.  The time prior to that he said he was walking and almost tripped over his grandson--this caused him to fall. His main complaint right now is the knee weakness that occurs when standing.   Past Medical History  Diagnosis Date  . Stroke     left thalamic infarct  . Diabetes mellitus   . Hypertension   . 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 TIA (transient ischemic attack)     multiple in past, most recently in 2011  . Sleep apnea   . CHF (congestive heart failure)   . Depression   . Hyperlipemia   . Arthritis     Past Surgical History  Procedure Laterality Date  . Knee arthroscopy      lt knee  . Shoulder arthroscopy w/ rotator cuff repair      rt shoulder  . Intracranial aneurysm repair    . Neck artery repaired from injury       Family History  Problem Relation Age of Onset  . Colon cancer Neg Hx   . Diabetes Sister   . Diabetes Brother   . Diabetes Paternal Aunt     Social History:  reports that he has quit smoking. He has never used smokeless tobacco. He reports that he does not drink alcohol or use illicit drugs.  Allergies  Allergen Reactions  . Pioglitazone Other (See Comments)    Edema   . Rosiglitazone Maleate Other (See Comments)    edema    MEDICATIONS:  Scheduled: . amLODipine  10 mg Oral Daily  . atorvastatin  20 mg Oral q1800  . carvedilol  12.5 mg Oral BID WC  . clopidogrel  75 mg Oral Q breakfast  . enoxaparin (LOVENOX) injection  40 mg Subcutaneous Q24H  . furosemide  80 mg Oral Daily  . insulin glargine  24 Units Subcutaneous QHS  . lisinopril  20 mg Oral Daily  . sodium chloride  3 mL Intravenous Q12H  . sodium chloride  3 mL Intravenous Q12H  . spironolactone  25 mg Oral Daily     ROS:                                                                                                                                       History obtained from the patient  General ROS: negative for - chills, fatigue, fever, night sweats, weight gain or weight loss Psychological ROS: negative for - behavioral disorder, hallucinations, memory difficulties, mood swings or suicidal ideation Ophthalmic ROS: negative for - blurry vision, double vision, eye pain or loss of vision ENT ROS: negative for - epistaxis, nasal discharge, oral lesions, sore throat, tinnitus or vertigo Allergy and Immunology ROS: negative for - hives or itchy/watery eyes Hematological and Lymphatic ROS: negative for - bleeding problems, bruising or swollen lymph nodes Endocrine ROS: negative for - galactorrhea, hair pattern changes, polydipsia/polyuria or temperature intolerance Respiratory ROS: negative for -  cough, hemoptysis, shortness of breath or wheezing Cardiovascular ROS: negative for - chest pain, dyspnea on exertion, edema or irregular heartbeat Gastrointestinal ROS: negative for - abdominal pain, diarrhea, hematemesis, nausea/vomiting or stool incontinence Genito-Urinary ROS: negative for - dysuria, hematuria, incontinence or urinary frequency/urgency Musculoskeletal ROS: negative for - joint swelling or muscular weakness Neurological ROS: as noted in HPI Dermatological ROS: negative for rash and skin lesion changes   Blood pressure 190/95, pulse 83, temperature 98.7 F (37.1 C), temperature source Oral, resp. rate 18, height 5\' 7"  (1.702 m), weight 97.2 kg (214 lb 4.6 oz), SpO2 96.00%.   Neurologic Examination:                                                                                                      Mental Status: Alert, oriented.  Flat affect and facies. He is able to name 6 animals in one minute, unable to count seriel 7's, unable to spell the word [id number] backwards and can only recall 1 of three objects after 3 minutes. Speech fluent without evidence of aphasia.  Able to follow 3 step commands without difficulty. Cranial Nerves: II: Discs flat bilaterally; Visual fields grossly normal, pupils equal, round, reactive to light and accommodation III,IV, VI: ptosis not present, extra-ocular motions intact bilaterally V,VII: smile symmetric, facial light touch sensation normal bilaterally VIII: hearing normal bilaterally IX,X: gag reflex present XI: bilateral shoulder shrug XII: midline tongue extension Motor: Right : Upper extremity   5/5    Left:     Upper extremity   4-/5  Lower extremity   5/5     Lower extremity   5/5 Tone and bulk:normal tone throughout; no atrophy noted Sensory: Pinprick and light touch intact throughout, bilaterally but has decreased sensation to cold temperature from his toes to mid calf.   vibratory sensation is decreased Deep Tendon Reflexes:  no DTR in ankles and knees, 1+ in bilateral UE Plantars: Right: downgoing   Left: downgoing Cerebellar: normal finger-to-nose,  normal heel-to-shin test Gait: Narrow based with small steps and shuffling. .  CV: pulses palpable throughout    Lab Results  Component Value Date/Time   CHOL 170 03/05/2012  4:32 PM    Results for orders placed during the hospital encounter of 03/15/13 (from the past 48 hour(s))  GLUCOSE, CAPILLARY     Status: Abnormal   Collection Time    03/15/13  7:48 PM      Result Value Range   Glucose-Capillary 167 (*) 70 - 99 mg/dL  CBC WITH DIFFERENTIAL     Status: Abnormal   Collection Time    03/15/13  8:02 PM      Result Value Range   WBC 6.8  4.0 - 10.5 K/uL   RBC 4.72  4.22 - 5.81 MIL/uL   Hemoglobin 12.2 (*) 13.0 - 17.0 g/dL   HCT 19.1 (*) 47.8 - 29.5 %   MCV 82.0  78.0 - 100.0 fL   MCH 25.8 (*) 26.0 - 34.0 pg   MCHC 31.5  30.0 - 36.0 g/dL   RDW 62.1  30.8 - 65.7 %   Platelets 187  150 - 400 K/uL   Neutrophils Relative % 72  43 - 77 %   Neutro Abs 4.9  1.7 - 7.7 K/uL   Lymphocytes Relative 21  12 - 46 %   Lymphs Abs 1.4  0.7 - 4.0 K/uL   Monocytes Relative 6  3 - 12 %   Monocytes Absolute 0.4  0.1 - 1.0 K/uL   Eosinophils Relative 1  0 - 5 %   Eosinophils Absolute 0.1  0.0 - 0.7 K/uL   Basophils Relative 0  0 - 1 %   Basophils Absolute 0.0  0.0 - 0.1 K/uL  COMPREHENSIVE METABOLIC PANEL     Status: Abnormal   Collection Time    03/15/13  8:45 PM      Result Value Range   Sodium 142  135 - 145 mEq/L   Potassium 4.3  3.5 - 5.1 mEq/L   Chloride 105  96 - 112 mEq/L   CO2 27  19 - 32 mEq/L   Glucose, Bld 172 (*) 70 - 99 mg/dL   BUN 18  6 - 23 mg/dL   Creatinine, Ser 8.46  0.50 - 1.35 mg/dL   Calcium 9.9  8.4 - 96.2 mg/dL   Total Protein 7.3  6.0 - 8.3 g/dL   Albumin 3.9  3.5 - 5.2 g/dL   AST 21  0 - 37 U/L   ALT 19  0 - 53 U/L   Alkaline Phosphatase  67  39 - 117 U/L   Total Bilirubin 0.6  0.3 - 1.2 mg/dL   GFR calc non Af Amer 62 (*) >90  mL/min   GFR calc Af Amer 72 (*) >90 mL/min   Comment:            The eGFR has been calculated     using the CKD EPI equation.     This calculation has not been     validated in all clinical     situations.     eGFR's persistently     <90 mL/min signify     possible Chronic Kidney Disease.  MAGNESIUM     Status: None   Collection Time    03/15/13  8:45 PM      Result Value Range   Magnesium 2.1  1.5 - 2.5 mg/dL  GLUCOSE, CAPILLARY     Status: Abnormal   Collection Time    03/15/13  9:34 PM      Result Value Range   Glucose-Capillary 138 (*) 70 - 99 mg/dL  URINALYSIS W MICROSCOPIC + REFLEX CULTURE     Status: Abnormal   Collection Time    03/15/13  9:53 PM      Result Value Range   Color, Urine YELLOW  YELLOW   APPearance CLOUDY (*) CLEAR   Specific Gravity, Urine 1.024  1.005 - 1.030   pH 5.5  5.0 - 8.0   Glucose, UA 100 (*) NEGATIVE mg/dL   Hgb urine dipstick TRACE (*) NEGATIVE   Bilirubin Urine NEGATIVE  NEGATIVE   Ketones, ur 15 (*) NEGATIVE mg/dL   Protein, ur >161 (*) NEGATIVE mg/dL   Urobilinogen, UA 1.0  0.0 - 1.0 mg/dL   Nitrite NEGATIVE  NEGATIVE   Leukocytes, UA NEGATIVE  NEGATIVE   WBC, UA 0-2  <3 WBC/hpf   RBC / HPF 0-2  <3 RBC/hpf   Bacteria, UA FEW (*) RARE   Squamous Epithelial / LPF FEW (*) RARE   Casts HYALINE CASTS (*) NEGATIVE  PRO B NATRIURETIC PEPTIDE     Status: Abnormal   Collection Time    03/16/13 12:23 AM      Result Value Range   Pro B Natriuretic peptide (BNP) 130.7 (*) 0 - 125 pg/mL  GLUCOSE, CAPILLARY     Status: Abnormal   Collection Time    03/16/13  1:19 AM      Result Value Range   Glucose-Capillary 120 (*) 70 - 99 mg/dL  VITAMIN W96     Status: None   Collection Time    03/16/13  5:15 AM      Result Value Range   Vitamin B-12 763  211 - 911 pg/mL  TSH     Status: None   Collection Time    03/16/13  5:15 AM      Result Value Range   TSH 2.077  0.350 - 4.500 uIU/mL  CK     Status: Abnormal   Collection Time    03/16/13   5:15 AM      Result Value Range   Total CK 382 (*) 7 - 232 U/L  GLUCOSE, CAPILLARY     Status: None   Collection Time    03/16/13  7:33 AM      Result Value Range   Glucose-Capillary 98  70 - 99 mg/dL   Comment 1 Notify RN    GLUCOSE, CAPILLARY     Status: Abnormal   Collection Time    03/16/13  11:34 AM      Result Value Range   Glucose-Capillary 166 (*) 70 - 99 mg/dL   Comment 1 Notify RN      Dg Chest 2 View  03/15/2013   *RADIOLOGY REPORT*  Clinical Data: Weakness  CHEST - 2 VIEW  Comparison: 05/04/2012  Findings: Linear left lower lobe atelectasis or scarring noted. Lung volumes are low with crowding of the bronchovascular markings, but slightly improved since the prior exam.  Cardiac leads obscure detail.  Heart size upper limits of normal.  No pleural effusion.  IMPRESSION: Low volume exam with crowding of the bronchovascular markings but no acute finding allowing for this. If the patient's symptoms continue, consider PA and lateral chest radiographs obtained at full inspiration when the patient is clinically able.   Original Report Authenticated By: Christiana Pellant, M.D.   Ct Head Wo Contrast  03/15/2013   *RADIOLOGY REPORT*  Clinical Data: Acute onset of weakness; slumped into kneeling position.  CT HEAD WITHOUT CONTRAST  Technique:  Contiguous axial images were obtained from the base of the skull through the vertex without contrast.  Comparison: CT of the head performed 08/06/2012, and MRI of the brain performed 05/05/2012  Findings: There is no evidence of acute infarction, mass lesion, or intra- or extra-axial hemorrhage on CT.  Prominence of the ventricles and sulci reflects mild cortical volume loss.  Cerebellar atrophy is noted.  Diffuse periventricular and subcortical white matter change likely reflects small vessel ischemic microangiopathy.  Chronic ischemic change is noted at the right basal ganglia and left anterior corona radiata. Postoperative change is seen at the right MCA  circulation.  The brainstem and fourth ventricle are within normal limits.  The cerebral hemispheres demonstrate grossly normal gray-white differentiation.  No mass effect or midline shift is seen.  There is no evidence of fracture; a right frontoparietal craniotomy flap is noted, with overlying soft tissue scarring.  The visualized portions of the orbits are within normal limits.  The paranasal sinuses and mastoid air cells are well-aerated.  No significant soft tissue abnormalities are seen.  IMPRESSION:  1.  No acute intracranial pathology seen on CT. 2.  Mild cortical volume loss and diffuse small vessel ischemic microangiopathy. 3.  Chronic ischemic change at the right basal ganglia and left anterior corona radiata. 4.  Postoperative changes noted, with right frontoparietal craniotomy flap.   Original Report Authenticated By: Tonia Ghent, M.D.    Assessment and plan discussed with with attending physician and they are in agreement.    Felicie Morn PA-C [facility] [phone]  03/16/2013, 12:11 PM   I have seen and evaluated the patient. I have reviewed the above note and made appropriate changes.   I have personally reviewed the previous images: ct head - no acute findings, but mulitple old infarcts  Assessment/Plan:  72 year old male with a history of falls progressive for the past 6 months. Suspect that his gait disorder is multifactorial in nature including previous strokes, peripheral neuropathy, possible dementia. It is unclear to me what caused him to go down slowly, or if he had lightheadedness during the episode. There is also a question of cervical stenosis which can certainly contribute to falls, and his peripheral neuropathy may mask hyporeflexia secondary to this.  His gait could be parkinsonian, but no other features on exam to suggest this. I suspect multifactorial dysfunction as opposed to parkinsonism, though if abilify did start just prior to his worsening, then  this may need to be considered as a  possible contributor.   1) Agree with MRI brain and C-spine 2) physical therapy  3) RPR last year negative, B12 normal.    Ritta Slot, MD [facility] [phone]  If 7pm- 7am, please page neurology on call at [phone].

## 2013-03-16 NOTE — H&P (Signed)
Date: 03/16/2013               Patient Name:  [name] MRN: [id number]  DOB: [birth year] Age / Sex: 72 y.o., male   PCP: Henry Headland, MD              Medical Service: Internal Medicine Teaching Service              Attending Physician: Dr. Margarito Bates    First Contact: Dr. Lavena Bates Pager: [phone]  Second Contact: Dr. Dorise Bates Pager: [phone]            After Hours (After 5p/  First Contact Pager: [phone]  weekends / holidays): Second Contact Pager: [phone]      Chief Complaint: Fall.  History of Present Illness: Patient is a 72 y.o. male with a PMHx of diabetes, hypertension, dementia, obstructive sleep apnea, CVA, with left-sided weakness who presents to [facility] for evaluation of cause of his falls and management.  According to the patient's wife [name] was well until about 5:30 PM on the day of admission when he fell on the back porch of his house. The patient was reportedly taking close to the laundry room and on his way back to the house, he knees become shaky before his legs gave way and he fell to the ground slowly. The patient reports that the impact was minimal as he tried to hold himself down during the fall. He pressed his life alert, and EMS arrived to bring him to the ED. He estimates that spent about 30 minutes on the floor. No history of hitting his head. He denies any symptoms of dizziness, chest pain, diaphoresis, loss of consciousness, or palpitations prior or during the episode. He remained fully conscious. While lying on the floor, but he was feeling too weak to get up by himself. According to his wife, the patient has been declining gradually over the past year requiring the use of a cane and more recently a wheelchair on and off for the last 1 year. He has lost 2 of his brothers in the last 3 months. Following family tragedy the patient has declined even more. She has residual left arm weakness due to a stroke in 2009.  No history of fevers or chills. No  vomiting or nausea. Patient had had breakfast and lunch. He thinks that he had adequate fluid intake before the fall.  Review of Systems: Constitutional:  denies fever, chills, diaphoresis, decreased appetite change. Reports fatigue and increased sleepiness.  HEENT: denies eye pain or discharge, ear pain or discharge, hearing loss, nasal congestion, sore throat.  Respiratory: denies SOB, DOE, cough, chest tightness, and wheezing.  Cardiovascular: denies chest pain, and palpitations. Occassional leg swelling.  Gastrointestinal: denies nausea, vomiting, abdominal pain, diarrhea, constipation, blood in stool.  Genitourinary: denies dysuria, urgency, frequency, hematuria, flank pain.  Musculoskeletal: denies myalgias, arthralgias, back pain, joint swelling, and gait problem.   Skin: denies rash and wound.  Neurological: denies dizziness, seizures, syncope, weakness, light-headedness, numbness and headaches.   Hematological: denies easy bruising, personal or family bleeding history.  Psychiatric: denies depression, anxiety, suicidal / homicidal ideation. Patient spends most of the day sleeping.    No current facility-administered medications on file prior to encounter.   Current Outpatient Prescriptions on File Prior to Encounter  Medication Sig Dispense Refill  . amLODipine (NORVASC) 10 MG tablet Take 10 mg by mouth daily.      . carvedilol (COREG) 25 MG tablet Take 12.5  mg by mouth 2 (two) times daily with a meal.      . clopidogrel (PLAVIX) 75 MG tablet Take 1 tablet (75 mg total) by mouth daily.  30 tablet  11  . cyclobenzaprine (FLEXERIL) 5 MG tablet Take 1 tablet (5 mg total) by mouth 3 (three) times daily as needed.  90 tablet  6  . donepezil (ARICEPT) 10 MG tablet Take 1 tablet (10 mg total) by mouth at bedtime.  30 tablet  5  . eplerenone (INSPRA) 50 MG tablet Take 1 tablet (50 mg total) by mouth 2 (two) times daily.  60 tablet  3  . furosemide (LASIX) 80 MG tablet Take 80 mg by mouth  daily.      Marland Kitchen gabapentin (NEURONTIN) 600 MG tablet Take 1 tablet (600 mg total) by mouth 3 (three) times daily.  90 tablet  5  . insulin glargine (LANTUS) 100 UNIT/ML injection Inject 0.24 mLs (24 Units total) into the skin at bedtime.  15 mL  12  . lisinopril (PRINIVIL,ZESTRIL) 20 MG tablet Take 20 mg by mouth daily.      . simvastatin (ZOCOR) 40 MG tablet Take 40 mg by mouth at bedtime.      Marland Kitchen terazosin (HYTRIN) 5 MG capsule Take 1 capsule (5 mg total) by mouth at bedtime.  90 capsule  3   Allergies: Allergies  Allergen Reactions  . Pioglitazone Other (See Comments)    Edema   . Rosiglitazone Maleate Other (See Comments)    edema     Past Medical History: Past Medical History  Diagnosis Date  . Stroke     left thalamic infarct  . Diabetes mellitus   . Hypertension   . 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 TIA (transient ischemic attack)     multiple in past, most recently in 2011  . Sleep apnea   . CHF (congestive heart failure)   . Depression   . Hyperlipemia   . Arthritis     Past Surgical History: Past Surgical History  Procedure Laterality Date  . Knee arthroscopy      lt knee  . Shoulder arthroscopy w/ rotator cuff repair      rt shoulder  . Intracranial aneurysm repair    . Neck artery repaired from injury      Family History: Family History  Problem Relation Age of Onset  . Colon cancer Neg Hx   . Diabetes Sister   . Diabetes Brother   . Diabetes Paternal Aunt     Social History: History   Social History  . Marital Status: Married    Spouse Name: N/A    Number of Children: N/A  . Years of Education: N/A   Occupational History  . Not on file.   Social History Main Topics  . Smoking status: Former Games developer  . Smokeless tobacco: Never Used     Comment: Quit smoking 30 yrs ago   . Alcohol Use: No  . Drug Use: No  . Sexually Active:  Not on file   Other Topics Concern  . Not on file   Social History Narrative  . No narrative on file     Vital Signs: Blood pressure 175/86, pulse 66, temperature 98.7 F (37.1 C), temperature source Oral, resp. rate 18, height 5\' 7"  (1.702 m), weight 213 lb 13.5 oz (97 kg), SpO2 99.00%.  Physical Exam: General:  Vital signs reviewed and noted. Well-developed, well-nourished, in no acute distress; alert, appropriate and cooperative throughout examination. Appears fatigued.  Head: Normocephalic, atraumatic.  Eyes: PERRL, EOMI, No signs of anemia or jaundince.  Nose: Mucous membranes moist, not inflammed, nonerythematous.  Throat: Oropharynx nonerythematous, no exudate appreciated.   Neck: No deformities, masses, or tenderness noted. Supple, No carotid Bruits, no JVD.  Lungs:  Normal respiratory effort. Clear to auscultation BL without crackles or wheezes.  Heart: RRR. S1 and S2 normal without gallop, murmur, or rubs.  Abdomen:  BS normoactive. Soft, Nondistended, non-tender.  No masses or organomegaly.  Extremities: No pretibial edema.  Neurologic: A&O X3, CN II - XII are grossly intact. Motor strength is 5/5 in the all 4 extremities, Sensations intact to light touch, Cerebellar signs negative. Abnormal gait with unsteadiness of his feet   Skin: No visible rashes, scars.    Lab results:  CURRENT LABS: CBC:    Component Value Date/Time   WBC 6.8 03/15/2013 2002   HGB 12.2* 03/15/2013 2002   HCT 38.7* 03/15/2013 2002   PLT 187 03/15/2013 2002   MCV 82.0 03/15/2013 2002   NEUTROABS 4.9 03/15/2013 2002   LYMPHSABS 1.4 03/15/2013 2002   MONOABS 0.4 03/15/2013 2002   EOSABS 0.1 03/15/2013 2002   BASOSABS 0.0 03/15/2013 2002     Metabolic Panel:    Component Value Date/Time   NA 142 03/15/2013 2045   K 4.3 03/15/2013 2045   CL 105 03/15/2013 2045   CO2 27 03/15/2013 2045   BUN 18 03/15/2013 2045   CREATININE 1.16 03/15/2013 2045   CREATININE 1.52* 06/25/2012 1618   GLUCOSE 172* 03/15/2013 2045    CALCIUM 9.9 03/15/2013 2045   AST 21 03/15/2013 2045   ALT 19 03/15/2013 2045   ALKPHOS 67 03/15/2013 2045   BILITOT 0.6 03/15/2013 2045   PROT 7.3 03/15/2013 2045   ALBUMIN 3.9 03/15/2013 2045     Urinalysis:  Recent Labs  03/15/13 2153  COLORURINE YELLOW  LABSPEC 1.024  PHURINE 5.5  GLUCOSEU 100*  HGBUR TRACE*  BILIRUBINUR NEGATIVE  KETONESUR 15*  PROTEINUR >300*  UROBILINOGEN 1.0  NITRITE NEGATIVE  LEUKOCYTESUR NEGATIVE     Drugs of Abuse     Component Value Date/Time   LABOPIA NONE DETECTED 08/06/2012 1900   LABOPIA NEGATIVE 03/11/2009 0532   COCAINSCRNUR NONE DETECTED 08/06/2012 1900   COCAINSCRNUR NEGATIVE 03/11/2009 0532   LABBENZ NONE DETECTED 08/06/2012 1900   LABBENZ NEGATIVE 03/11/2009 0532   AMPHETMU NONE DETECTED 08/06/2012 1900   AMPHETMU NEGATIVE 03/11/2009 0532   THCU NONE DETECTED 08/06/2012 1900   LABBARB NONE DETECTED 08/06/2012 1900     Recent Labs Lab 03/16/13 0023  PROBNP 130.7*     HISTORICAL LABS: Lab Results  Component Value Date   HGBA1C 7.6 02/11/2013    Lab Results  Component Value Date   CHOL 170 03/05/2012   HDL 36* 03/05/2012   LDLCALC 109* 03/05/2012   TRIG 126 03/05/2012   CHOLHDL 4.7 03/05/2012    Lab Results  Component Value Date   TSH 1.305 05/05/2012     Imaging results:  Dg Chest 2 View  03/15/2013   *RADIOLOGY REPORT*  Clinical Data: Weakness  CHEST - 2 VIEW  Comparison: 05/04/2012  Findings: Linear left lower lobe atelectasis or scarring noted. Lung volumes are low with crowding of the bronchovascular markings, but slightly improved since the prior exam.  Cardiac leads obscure detail.  Heart size upper limits of normal.  No pleural effusion.  IMPRESSION: Low volume exam with crowding of the bronchovascular markings but no acute finding allowing for this. If the patient's symptoms continue, consider PA and lateral chest radiographs obtained at full inspiration when the patient is clinically able.   Original Report Authenticated By:  Christiana Pellant, M.D.   Ct Head Wo Contrast  03/15/2013   *RADIOLOGY REPORT*  Clinical Data: Acute onset of weakness; slumped into kneeling position.  CT HEAD WITHOUT CONTRAST  Technique:  Contiguous axial images were obtained from the base of the skull through the vertex without contrast.  Comparison: CT of the head performed 08/06/2012, and MRI of the brain performed 05/05/2012  Findings: There is no evidence of acute infarction, mass lesion, or intra- or extra-axial hemorrhage on CT.  Prominence of the ventricles and sulci reflects mild cortical volume loss.  Cerebellar atrophy is noted.  Diffuse periventricular and subcortical white matter change likely reflects small vessel ischemic microangiopathy.  Chronic ischemic change is noted at the right basal ganglia and left anterior corona radiata. Postoperative change is seen at the right MCA circulation.  The brainstem and fourth ventricle are within normal limits.  The cerebral hemispheres demonstrate grossly normal gray-white differentiation.  No mass effect or midline shift is seen.  There is no evidence of fracture; a right frontoparietal craniotomy flap is noted, with overlying soft tissue scarring.  The visualized portions of the orbits are within normal limits.  The paranasal sinuses and mastoid air cells are well-aerated.  No significant soft tissue abnormalities are seen.  IMPRESSION:  1.  No acute intracranial pathology seen on CT. 2.  Mild cortical volume loss and diffuse small vessel ischemic microangiopathy. 3.  Chronic ischemic change at the right basal ganglia and left anterior corona radiata. 4.  Postoperative changes noted, with right frontoparietal craniotomy flap.   Original Report Authenticated By: Tonia Ghent, M.D.     Other results: EKG (03/15/2013)  Rate: 93  Rhythm: normal sinus rhythm  QRS Axis: left  Intervals: normal  ST/T Wave abnormalities: normal  Conduction Disutrbances:right bundle branch block  Narrative Interpretation:  Right bundle branch block, left axis deviation. When compared with ECG of 05/04/2012, no significant changes are seen  Old EKG Reviewed: unchanged   Assessment & Plan:  Pt is a 72 y.o. yo male with a PMHx of CVA in 2009, type 2 diabetes, hypertension, with severe obstructive apnea, and degenerative joint disease of the knees who was admitted on 03/15/2013 with symptoms of a fall, which happened on the day of admission. Interventions at this time will be focused on evaluation for the cause of his falls, and generalized body weakness.    Principal Problem:   Frequent falls Active Problems:   DIABETES MELLITUS, TYPE II   HYPERLIPIDEMIA   DEPRESSION, MILD   HYPERTENSION   History of CVA   DEPENDENT EDEMA, LEGS, BILATERAL   Mild cognitive impairment   Generalized weakness    1) Frequent falls/generalized weakness: Symptoms have been present for at least one year. Etiology is likely multifactorial, including deconditioning, depression, severe obstructive sleep apnea, bilateral DJD.  A new stroke is unlikely with nonfocal neurological exam. Other differentials to be considered like B12 deficiency, thyroid dysfunction will be checked. Previous RPR non-reactive X 3 most recently July 2013. While Normal pressure hydrocephalus should also be considered his brain MRI in July 2013 on revealed prior strokes, and white to matter disease. His current medications including gabapentin, Flexeril, and prazosin, likely contributing as well. Head CT scan in the ED was  negative for acute intracranial process. Plan. - Admit to telemetry to monitor for cardiac arhythythmia -Hold suspicious medications. -May consider Brain MRI - and recent worsening gait.  -PT/OT evaluation and treatment. -Vitamin B12 and TSH level. 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 He as residual left arm weakness. He was  evaluated in 2011 for a TIA. Brain MRI did not show acute infarct. No current clinical evidence of a new stroke. Plan  - Continue with Plavix -Continue with Lipitor   3) Diabetes types complicated with peripheral neuropathy.: Last A1c 7.6% 02/11/2013. He takes Lantus 24 units at bedtime. Peripheral neuropathy may be contributing to his frequent falls and deconditioning. Plan. -Continue with his Lantus  4) Hypertension: On the home, amlodipine, carvedilol, Lasix, and lisinopril, and spironolactone. BP continued to be elevated. Will continue with his home medications.  5)Depression: Patient is being followed by Dr. Donell Beers  Who recently started him on Abilify. However, the dose of Abilify, is unclear. He denies current suicidal ideations, but very possible that his generalized body weakness, and right is related to the depression. Plan  - Will contact pharmacy or Dr Plovsky's office to clarify dose - Will continue with outpatient care     DVT PPX - Lovenox  CODE STATUS - full   CONSULTS PLACED - None  DISPO - Disposition is deferred at this time, awaiting improvement of symptoms.   Anticipated discharge in approximately 2-3 day(s).   The patient does have a current PCP Henry Headland, MD [phone]) and does need an [facility] hospital follow-up appointment after discharge.    Is the [facility] hospital follow-up appointment a one-time only appointment? no.  Does the patient have transportation limitations that hinder transportation to clinic appointments? no.   Signed: Dow Adolph, MD  PGY-1, Internal Medicine Resident Pager: [phone] (7PM-7AM) 03/16/2013, 3:49 AM

## 2013-03-16 NOTE — Progress Notes (Addendum)
Nutrition Brief Note  Patient identified on the Malnutrition Screening Tool (MST) Report for recent weight loss without trying (patient unsure).  Per records below, patient did have some weight loss in April 2014, however back up this month.  Wt Readings from Last 10 Encounters:  03/16/13 214 lb 4.6 oz (97.2 kg)  02/11/13 208 lb 4.8 oz (94.484 kg)  10/02/12 225 lb 11.2 oz (102.377 kg)  08/29/12 221 lb (100.245 kg)  08/20/12 224 lb 3.2 oz (101.696 kg)  07/24/12 221 lb (100.245 kg)  06/25/12 222 lb 6.4 oz (100.88 kg)  05/21/12 224 lb (101.606 kg)  05/09/12 217 lb 13 oz (98.8 kg)  03/05/12 222 lb 6.4 oz (100.88 kg)    Body mass index is 33.55 kg/(m^2). Patient meets criteria for Obesity Class I based on current BMI.   Current diet order is Heart Healthy, patient is consuming approximately 100% of meals at this time. Labs and medications reviewed.   No nutrition interventions warranted at this time. If nutrition issues arise, please consult RD.   Maureen Chatters, RD, LDN Pager #: [phone] After-Hours Pager #: [phone]

## 2013-03-16 NOTE — H&P (Signed)
Internal Medicine Attending Admission Note Date: 03/16/2013  Patient name: [name] Medical record number: [id number] Date of birth: 1941-05-15 Age: 72 y.o. Gender: male  I saw and evaluated the patient. I reviewed the resident's note and I agree with the resident's findings and plan as documented in the resident's note, with the following additional comments.  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  Patient reports that he was standing and that his knees gave way causing him to fall; he denies any loss of consciousness, dizziness, near syncope, or other associated symptoms.  His wife reports that he has gait/balance problems chronically, and that he last fell about 2 weeks ago at home.  Patient is alert, oriented, and has no acute complaints.   Physical Exam - key components related to admission:  Filed Vitals:   03/16/13 0545 03/16/13 1049 03/16/13 1052 03/16/13 1055  BP: 173/65 184/93 185/99 190/95  Pulse: 79 73 79 83  Temp: 98.4 F (36.9 C) 98.7 F (37.1 C)    TempSrc: Oral Oral    Resp: 18 18 14 18   Height:      Weight: 214 lb 4.6 oz (97.2 kg)     SpO2: 97% 94% 95% 96%    General: Alert, oriented to situation, year, month, date, and location. Neck: No bruits Lungs: Clear Heart: Regular; no extra sounds or murmurs Abdomen: Bowel sounds present, soft, nontender; no  hepatosplenomegaly Extremities: Trace bilateral ankle edema Neurologic: Alert and oriented x3; cranial nerves II through XII intact; motor 4/5 in left upper extremity, otherwise 5 over 5   Lab results:   Basic Metabolic Panel:  Recent Labs  29/52/84 2045  NA 142  K 4.3  CL 105  CO2 27  GLUCOSE 172*  BUN 18  CREATININE 1.16  CALCIUM 9.9  MG 2.1   Liver Function Tests:  Recent Labs  03/15/13 2045  AST 21  ALT 19  ALKPHOS 67  BILITOT 0.6  PROT 7.3  ALBUMIN 3.9    CBC:  Recent Labs  03/15/13 2002  WBC 6.8  NEUTROABS 4.9  HGB 12.2*  HCT 38.7*  MCV 82.0  PLT 187    Cardiac Enzymes:  Recent Labs  03/16/13 0515  CKTOTAL 382*     CBG:  Recent Labs  03/15/13 1948 03/15/13 2134 03/16/13 0119 03/16/13 0733  GLUCAP 167* 138* 120* 98    Thyroid Function Tests:  Recent Labs  03/16/13 0515  TSH 2.077    Anemia Panel:  Recent Labs  03/16/13 0515  VITAMINB12 763    Urinalysis    Component Value Date/Time   COLORURINE YELLOW 03/15/2013 2153   APPEARANCEUR CLOUDY* 03/15/2013 2153   LABSPEC 1.024 03/15/2013 2153   PHURINE 5.5 03/15/2013 2153   GLUCOSEU 100* 03/15/2013 2153   HGBUR TRACE* 03/15/2013 2153   BILIRUBINUR NEGATIVE 03/15/2013 2153   KETONESUR 15* 03/15/2013 2153   PROTEINUR >300* 03/15/2013 2153   UROBILINOGEN 1.0 03/15/2013 2153   NITRITE NEGATIVE 03/15/2013 2153   LEUKOCYTESUR NEGATIVE 03/15/2013 2153  Urine microscopic: WBC 0-2, RBC 0-2, bacteria few, squamous epithelial few, hyaline casts noted   BNP    Component Value Date/Time   PROBNP 130.7* 03/16/2013 0023    Misc. Labs: Vitamin B12 763  Imaging results:  Dg Chest 2 View  03/15/2013   *RADIOLOGY REPORT*  Clinical Data: Weakness  CHEST - 2 VIEW  Comparison: 05/04/2012  Findings: Linear left lower lobe atelectasis or scarring noted. Lung volumes are low with crowding of the bronchovascular markings, but slightly improved since the prior exam.  Cardiac leads obscure detail.   Heart size upper limits of normal.  No pleural effusion.  IMPRESSION: Low volume exam with crowding of the bronchovascular markings but no acute finding allowing for this. If the patient's symptoms continue, consider PA and lateral chest radiographs obtained at full inspiration when the patient is clinically able.   Original Report Authenticated By: Christiana Pellant, M.D.   Ct Head Wo Contrast  03/15/2013   *RADIOLOGY REPORT*  Clinical Data: Acute onset of weakness; slumped into kneeling position.  CT HEAD WITHOUT CONTRAST  Technique:  Contiguous axial images were obtained from the base of the skull through the vertex without contrast.  Comparison: CT of the head performed 08/06/2012, and MRI of the brain performed 05/05/2012  Findings: There is no evidence of acute infarction, mass lesion, or intra- or extra-axial hemorrhage on CT.  Prominence of the ventricles and sulci reflects mild cortical volume loss.  Cerebellar atrophy is noted.  Diffuse periventricular and subcortical white matter change likely reflects small vessel ischemic microangiopathy.  Chronic ischemic change is noted at the right basal ganglia and left anterior corona radiata. Postoperative change is seen at the right MCA circulation.  The brainstem and fourth ventricle are within normal limits.  The cerebral hemispheres demonstrate grossly normal gray-white differentiation.  No mass effect or midline shift is seen.  There is no evidence of fracture; a right frontoparietal craniotomy flap is noted, with overlying soft tissue scarring.  The visualized portions of the orbits are within normal limits.  The paranasal sinuses and mastoid air cells are well-aerated.  No significant soft tissue abnormalities are seen.  IMPRESSION:  1.  No acute intracranial pathology seen on CT. 2.  Mild cortical volume loss and diffuse small vessel ischemic microangiopathy. 3.  Chronic ischemic change at the right basal ganglia and left anterior corona radiata. 4.   Postoperative changes noted, with right frontoparietal craniotomy flap.   Original Report Authenticated By: Tonia Ghent, M.D.    Other results: EKG: sinus rhythm; first degree AV block; right bundle branch block.   Assessment & Plan by Problem:  1.  Falls.  Patient has a history of chronic gait instability with prior falls, and is now admitted following a fall sustained at home.  He had no apparent injury, and he denies loss of consciousness.  He describes his falling events as his knees "giving way".  This is likely a multifactorial problem, with contribution from chronic cerebrovascular disease, deconditioning, DJD, and neuropathy.  An acute neurologic event such as CVA or seizure is possible.  The plan is MRI of the brain; EEG; neurology consult (patient is followed by Dr. Pearlean Brownie and is scheduled to see him in the near future); OT/PT consult; fall precautions.  2.  Diabetes mellitus with peripheral neuropathy.  Plan is continue Lantus insulin; follow blood sugars closely and adjust regimen as indicated.  3.  Hypertension.  Patient's blood pressure is not well controlled; plan ito institute home regimen and adjust  as indicated based on response.  4.  Depression.  Patient was reportedly recently started on Abilify by his psychiatrist Dr. Donell Beers.  Plan is to contact Dr. Donell Beers to clarify patient's current medication regimen and dose.  5.  Other problems and plans as per the resident physician's note.

## 2013-03-17 DIAGNOSIS — R319 Hematuria, unspecified: Secondary | ICD-10-CM

## 2013-03-17 DIAGNOSIS — I6350 Cerebral infarction due to unspecified occlusion or stenosis of unspecified cerebral artery: Secondary | ICD-10-CM

## 2013-03-17 DIAGNOSIS — Z9181 History of falling: Secondary | ICD-10-CM

## 2013-03-17 DIAGNOSIS — F0150 Vascular dementia without behavioral disturbance: Secondary | ICD-10-CM

## 2013-03-17 DIAGNOSIS — R269 Unspecified abnormalities of gait and mobility: Secondary | ICD-10-CM

## 2013-03-17 DIAGNOSIS — R5381 Other malaise: Secondary | ICD-10-CM

## 2013-03-17 LAB — GLUCOSE, CAPILLARY
Glucose-Capillary: 141 mg/dL — ABNORMAL HIGH (ref 70–99)
Glucose-Capillary: 166 mg/dL — ABNORMAL HIGH (ref 70–99)

## 2013-03-17 NOTE — Progress Notes (Signed)
Subjective:    Patient states he is feeling well this AM. He denies any complaints. Denies CP/SOB. Denies fever/chills. Denies lightheadedness/dizziness/HA.   Interval Events: No acute events.    Objective:    Vital Signs:   Temp:  [98.1 F (36.7 C)-98.7 F (37.1 C)] 98.4 F (36.9 C) (06/03 0527) Pulse Rate:  [73-83] 79 (06/03 0844) Resp:  [14-18] 18 (06/03 0527) BP: (143-190)/(60-99) 146/60 mmHg (06/03 0844) SpO2:  [92 %-97 %] 97 % (06/03 0527) Weight:  [206 lb 12.7 oz (93.8 kg)] 206 lb 12.7 oz (93.8 kg) (06/03 0527) Last BM Date: 03/16/13  24-hour weight change: Weight change: -7 lb 0.9 oz (-3.2 kg)  Intake/Output:   Intake/Output Summary (Last 24 hours) at 03/17/13 1030 Last data filed at 03/17/13 0836  Gross per 24 hour  Intake    960 ml  Output   2650 ml  Net  -1690 ml      Physical Exam: General: Vital signs reviewed and noted. Well-developed, well-nourished, in no acute distress; alert, appropriate and cooperative throughout examination.  Lungs:  Normal respiratory effort. Clear to auscultation BL without crackles or wheezes.  Heart: RRR. S1 and S2 normal.  Abdomen:  BS normoactive. Soft, Nondistended, non-tender.  No masses or organomegaly.  Extremities: Neuro: No pretibial edema. 4/5 strength in LUE, 5/5 strength in RUE, LLE, RLE. AAO x 3.      Labs:  Basic Metabolic Panel:  Recent Labs Lab 03/15/13 2045  NA 142  K 4.3  CL 105  CO2 27  GLUCOSE 172*  BUN 18  CREATININE 1.16  CALCIUM 9.9  MG 2.1    Liver Function Tests:  Recent Labs Lab 03/15/13 2045  AST 21  ALT 19  ALKPHOS 67  BILITOT 0.6  PROT 7.3  ALBUMIN 3.9   CBC:  Recent Labs Lab 03/15/13 2002  WBC 6.8  NEUTROABS 4.9  HGB 12.2*  HCT 38.7*  MCV 82.0  PLT 187    Cardiac Enzymes:  Recent Labs Lab 03/16/13 0515  CKTOTAL 382*    CBG:  Recent Labs Lab 03/16/13 0733 03/16/13 1134 03/16/13 1622 03/16/13 2054 03/17/13 0749  GLUCAP 98 166* 239* 251* 141*     Microbiology: Results for orders placed during the hospital encounter of 05/05/12  URINE CULTURE     Status: None   Collection Time    05/06/12 10:21 AM      Result Value Range Status   Specimen Description URINE, CLEAN CATCH   Final   Special Requests NONE   Final   Culture  Setup Time 05/06/2012 11:17   Final   Colony Count 3,000 COLONIES/ML   Final   Culture INSIGNIFICANT GROWTH   Final   Report Status 05/07/2012 FINAL   Final    Imaging: Dg Chest 2 View  03/15/2013   *RADIOLOGY REPORT*  Clinical Data: Weakness  CHEST - 2 VIEW  Comparison: 05/04/2012  Findings: Linear left lower lobe atelectasis or scarring noted. Lung volumes are low with crowding of the bronchovascular markings, but slightly improved since the prior exam.  Cardiac leads obscure detail.  Heart size upper limits of normal.  No pleural effusion.  IMPRESSION: Low volume exam with crowding of the bronchovascular markings but no acute finding allowing for this. If the patient's symptoms continue, consider PA and lateral chest radiographs obtained at full inspiration when the patient is clinically able.   Original Report Authenticated By: Christiana Pellant, M.D.   Ct Head Wo Contrast  03/15/2013   *RADIOLOGY  REPORT*  Clinical Data: Acute onset of weakness; slumped into kneeling position.  CT HEAD WITHOUT CONTRAST  Technique:  Contiguous axial images were obtained from the base of the skull through the vertex without contrast.  Comparison: CT of the head performed 08/06/2012, and MRI of the brain performed 05/05/2012  Findings: There is no evidence of acute infarction, mass lesion, or intra- or extra-axial hemorrhage on CT.  Prominence of the ventricles and sulci reflects mild cortical volume loss.  Cerebellar atrophy is noted.  Diffuse periventricular and subcortical white matter change likely reflects small vessel ischemic microangiopathy.  Chronic ischemic change is noted at the right basal ganglia and left anterior corona  radiata. Postoperative change is seen at the right MCA circulation.  The brainstem and fourth ventricle are within normal limits.  The cerebral hemispheres demonstrate grossly normal gray-white differentiation.  No mass effect or midline shift is seen.  There is no evidence of fracture; a right frontoparietal craniotomy flap is noted, with overlying soft tissue scarring.  The visualized portions of the orbits are within normal limits.  The paranasal sinuses and mastoid air cells are well-aerated.  No significant soft tissue abnormalities are seen.  IMPRESSION:  1.  No acute intracranial pathology seen on CT. 2.  Mild cortical volume loss and diffuse small vessel ischemic microangiopathy. 3.  Chronic ischemic change at the right basal ganglia and left anterior corona radiata. 4.  Postoperative changes noted, with right frontoparietal craniotomy flap.   Original Report Authenticated By: Tonia Ghent, M.D.   Mr Brain Wo Contrast  03/17/2013   *RADIOLOGY REPORT*  Clinical Data:  Increasing inability to walk.  Declining memory. Previous strokes. Diabetic hypertensive hyperlipidemic patient. History of right middle cerebral artery bifurcation aneurysm.  MRI HEAD WITHOUT CONTRAST MRI CERVICAL SPINE WITHOUT CONTRAST  Technique:  Multiplanar, multiecho pulse sequences of the brain and surrounding structures, and cervical spine, to include the craniocervical junction and cervicothoracic junction, were obtained without intravenous contrast.  Comparison:  03/15/2013 CT.  05/05/2012 MR.  04/23/2008 catheter angiogram.  No comparison cervical spine MR.  MRI HEAD  Findings:  No acute infarct.  Prior right frontal craniotomy for clipping of right middle cerebral artery bifurcation aneurysm.  Artifact at this level.  No intracranial hemorrhage.  Prior basal ganglia, caudate and thalamic infarcts.  Moderate white matter type changes consistent with result of small vessel disease. Altered signal intensity within the medulla  probably related to prior infarcts/small vessel disease type changes.  Global atrophy without hydrocephalus.  No intracranial mass lesion detected on this unenhanced exam.  Cervical medullary junction, pituitary pineal region unremarkable. Mild exophthalmos.  Major intracranial vascular structures are patent.  Limited for detection of aneurysm.  Minimal mucosal thickening right maxillary sinus.  IMPRESSION: No acute infarct.  Prior right frontal craniotomy for clipping of right middle cerebral artery bifurcation aneurysm.  Artifact at this level.  No intracranial hemorrhage.  Prior basal ganglia, caudate and thalamic infarcts.  Moderate white matter type changes consistent with result of small vessel disease.  Global atrophy without hydrocephalus.  MRI CERVICAL SPINE  Findings: Motion degraded exam.  Cervical medullary junction unremarkable.  No definitive focal cervical cord signal abnormality.  Visualized paravertebral structures unremarkable.  Both vertebral arteries are patent.  C2-3:  Minimal bulge greater to the left.  C3-4:  Moderate broad-based disc osteophyte complex.  Buckling posterior ligaments.  Moderate spinal stenosis with cord flattening.  Within the compressed cord, no definitive evidence of gliosis and / or edema.  Marked bilateral  foraminal narrowing.  C4-5:  Bulge/broad-based protrusion minimally more notable to the right.  Spinal stenosis and mild cord flattening greater on the right.  Uncinate bony overgrowth.  Moderate bilateral foraminal narrowing greater on the right.  C5-6:  Broad-based disc osteophyte complex greater to the right. Mild spinal stenosis greater on the right.  Minimal cord contact on the right.  Uncinate hypertrophy.  Moderate bilateral foraminal narrowing.  C6-7:  Minimal bulge.  Uncinate hypertrophy with mild right-sided and mild to moderate left-sided foraminal narrowing.  C7-T1:  Minimal bulge.  Minimal uncinate hypertrophy.  Minimal foraminal narrowing greater on the  right.  T1-2:  Small Schmorl's node deformities superior plate T2.  IMPRESSION: Motion degraded exam.  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   Original Report Authenticated By: Lacy Duverney, M.D.   Mr Cervical Spine Wo Contrast  03/17/2013   *RADIOLOGY REPORT*  Clinical Data:  Increasing inability to walk.  Declining memory. Previous strokes. Diabetic hypertensive hyperlipidemic patient. History of right middle cerebral artery bifurcation aneurysm.  MRI HEAD WITHOUT CONTRAST MRI CERVICAL SPINE WITHOUT CONTRAST  Technique:  Multiplanar, multiecho pulse sequences of the brain and surrounding structures, and cervical spine, to include the craniocervical junction and cervicothoracic junction, were obtained without intravenous contrast.  Comparison:  03/15/2013 CT.  05/05/2012 MR.  04/23/2008 catheter angiogram.  No comparison cervical spine MR.  MRI HEAD  Findings:  No acute infarct.  Prior right frontal craniotomy for clipping of right middle cerebral artery bifurcation aneurysm.  Artifact at this level.  No intracranial hemorrhage.  Prior basal ganglia, caudate and thalamic infarcts.  Moderate white matter type changes consistent with result of small vessel disease. Altered signal intensity within the medulla probably related to prior infarcts/small vessel disease type changes.  Global atrophy without hydrocephalus.  No intracranial mass lesion detected on this unenhanced exam.  Cervical medullary junction, pituitary pineal region unremarkable. Mild exophthalmos.  Major intracranial vascular structures are patent.  Limited for detection of aneurysm.  Minimal mucosal thickening right maxillary sinus.  IMPRESSION: No acute infarct.  Prior right frontal craniotomy for clipping of right middle cerebral artery bifurcation aneurysm.  Artifact at this level.  No intracranial hemorrhage.  Prior basal  ganglia, caudate and thalamic infarcts.  Moderate white matter type changes consistent with result of small vessel disease.  Global atrophy without hydrocephalus.  MRI CERVICAL SPINE  Findings: Motion degraded exam.  Cervical medullary junction unremarkable.  No definitive focal cervical cord signal abnormality.  Visualized paravertebral structures unremarkable.  Both vertebral arteries are patent.  C2-3:  Minimal bulge greater to the left.  C3-4:  Moderate broad-based disc osteophyte complex.  Buckling posterior ligaments.  Moderate spinal stenosis with cord flattening.  Within the compressed cord, no definitive evidence of gliosis and / or edema.  Marked bilateral foraminal narrowing.  C4-5:  Bulge/broad-based protrusion minimally more notable to the right.  Spinal stenosis and mild cord flattening greater on the right.  Uncinate bony overgrowth.  Moderate bilateral foraminal narrowing greater on the right.  C5-6:  Broad-based disc osteophyte complex greater to the right. Mild spinal stenosis greater on the right.  Minimal cord contact on the right.  Uncinate hypertrophy.  Moderate bilateral foraminal narrowing.  C6-7:  Minimal bulge.  Uncinate hypertrophy with mild right-sided and mild to moderate left-sided foraminal narrowing.  C7-T1:  Minimal bulge.  Minimal uncinate hypertrophy.  Minimal foraminal narrowing greater on the right.  T1-2:  Small Schmorl's node deformities superior plate T2.  IMPRESSION: Motion degraded exam.  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   Original Report Authenticated By: Lacy Duverney, M.D.       Medications:    Infusions:    Scheduled Medications: . amLODipine  10 mg Oral Daily  . atorvastatin  20 mg Oral q1800  . carvedilol  12.5 mg Oral BID WC  . clopidogrel  75 mg Oral Q breakfast  . enoxaparin (LOVENOX) injection  40 mg Subcutaneous Q24H  .  furosemide  80 mg Oral Daily  . insulin glargine  24 Units Subcutaneous QHS  . lisinopril  20 mg Oral Daily  . sodium chloride  3 mL Intravenous Q12H  . sodium chloride  3 mL Intravenous Q12H  . spironolactone  25 mg Oral Daily    PRN Medications: sodium chloride, sodium chloride   Assessment/ Plan:   Pt is a 72 y.o. yo male with a PMHx of CVA with L-sided weakness, dementia, HTN, OSA, T2DM, who was admitted on 03/15/2013 with symptoms of weakness.  Generalized weakness - Likely multifactorial secondary to deconditioning, prior CVA with L-sided weakness, dementia, peripheral neuropathy, and other medical comorbidities. CK only mildly elevated. MRI brain unrevealing of any new or acute abnormalities. Neurology is recommending physical therapy--if PT deems patient appropriate for HH/outpatient PT, he will likely be discharged home later today.  - PT/OT evaluation and management  Cervical spinal stenosis - pt was noted on MRI cervical spine to have spinal stenosis at multiple levels--most prominent at C3-C4. Pt and family report that his L-sided weakness if residual from prior CVA, thus it is unclear to what extent his cervical spinal stenosis is affecting the patient's mobility/functional status. Regardless, he would benefit from outpatient follow-up with neurosurgery, which will be arranged prior to discharge.  - f/u with neurosurgery as an outpatient  History of CVA - MRI brain unrevealing of any acute stroke.  - cont plavix - cont lipitor  T2DM - CBG = 141 this AM, much improved since admission.  - cont lantus 24U qhs   HTN - BP improving this AM (146/60). Will continue current regimen while BP remains within/close to normal range, however will likely escalate regimen if BP increases again during his hospital stay (particularly as acute stroke has now been ruled out).  - cont norvasc - cont coreg - cont lasix - cont lisinopril - cont spironolactone  Depression - currently not  administering home abilify as it is unclear what dose he is prescribed. Will attempt to clarify dose with pharmacy and restart for the remainder of his hospital course.    DVT PPX - lovenox  CODE STATUS - full  CONSULTS PLACED - N/A  DISPO - Disposition is deferred at this time, awaiting improvement of weakness.   Anticipated discharge in approximately 1-2 day(s).   The patient does have a current PCP (BROWN, Alycia Rossetti, MD) and does need an [facility] hospital follow-up appointment after discharge.    Is the [facility] hospital follow-up appointment a one-time only appointment? not applicable.  Does the patient have transportation limitations that hinder transportation to clinic appointments? unknown   SERVICE NEEDED AT DISCHARGE - TO BE DETERMINED DURING HOSPITAL COURSE         Y = Yes, Blank = No PT:   OT:   RN:   Equipment:   Other:  Length of Stay: 2 day(s)   Signed: Elfredia Nevins, MD  PGY-1, Internal Medicine Resident Pager: [phone] (7AM-5PM) 03/17/2013, 10:30 AM

## 2013-03-17 NOTE — Progress Notes (Signed)
Clinical Social Work Department BRIEF PSYCHOSOCIAL ASSESSMENT 03/17/2013  Patient:  [name]     Account Number:  [id number]     Admit date:  03/15/2013  Clinical Social Worker:  Kirke Shaggy  Date/Time:  03/17/2013 09:00 AM  Referred by:  Physician  Date Referred:  03/17/2013 Referred for  SNF Placement   Other Referral:   Interview type:  Patient Other interview type:   Met with pt at bedside to discuss SNF placement.    PSYCHOSOCIAL DATA Living Status:  FAMILY Admitted from facility:   Level of care:   Primary support name:  [facility] Primary support relationship to patient:  SPOUSE Degree of support available:   Per pt very good support, he has 3 daughters that live a mile from his home.    CURRENT CONCERNS Current Concerns  Post-Acute Placement   Other Concerns:    SOCIAL WORK ASSESSMENT / PLAN Met with pt to discuss SNF placement in the [place] area.  The pt was alert, and pleasant to speak with about placement.  His wife is in good health and his grown kids also help the parents when or if they need anything.   Assessment/plan status:  No Further Intervention Required Other assessment/ plan:   Information/referral to community resources:   Gave the pt a list of SNF's in the [place] area. He stated that he went to [facility] and never wants to go back there again.    PATIENT'S/FAMILY'S RESPONSE TO PLAN OF CARE: Pt is feeling positive about the need to go to SNF for PT rehab.    Sherald Barge, LCSW-A Clinical Social Worker [phone]

## 2013-03-17 NOTE — Progress Notes (Signed)
NEURO HOSPITALIST PROGRESS NOTE   SUBJECTIVE:                                                                                                                        Patient has no complaints today.   OBJECTIVE:                                                                                                                           Vital signs in last 24 hours: Temp:  [98.1 F (36.7 C)-98.7 F (37.1 C)] 98.4 F (36.9 C) (06/03 0527) Pulse Rate:  [73-83] 79 (06/03 0844) Resp:  [14-18] 18 (06/03 0527) BP: (143-190)/(60-99) 146/60 mmHg (06/03 0844) SpO2:  [92 %-97 %] 97 % (06/03 0527) Weight:  [93.8 kg (206 lb 12.7 oz)] 93.8 kg (206 lb 12.7 oz) (06/03 0527)  Intake/Output from previous day: 06/02 0701 - 06/03 0700 In: 1080 [P.O.:1080] Out: 2650 [Urine:2650] Intake/Output this shift: Total I/O In: 240 [P.O.:240] Out: -  Nutritional status: Cardiac  Past Medical History  Diagnosis Date  . Stroke     left thalamic infarct  . Diabetes mellitus   . Hypertension   . 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 TIA (transient ischemic attack)     multiple in past, most recently in 2011  . Sleep apnea   . CHF (congestive heart failure)   . Depression   . Hyperlipemia   . Arthritis      Neurologic Exam:  Mental Status:  Alert, oriented. Flat affect and facies. Speech fluent without evidence of aphasia. Able to follow 3 step commands without difficulty.  Cranial Nerves:  II: Visual fields grossly normal, pupils equal, round, reactive to light and accommodation  III,IV, VI: ptosis not present, extra-ocular motions intact bilaterally  V,VII: smile symmetric, facial light touch sensation normal bilaterally  VIII: hearing normal bilaterally  IX,X: gag reflex present  XI: bilateral shoulder shrug  XII: midline tongue extension  Motor:   Right : Upper extremity 5/5  Left:  Upper extremity 4-/5   Lower extremity 5/5   Lower extremity 5/5  Tone and bulk:normal tone throughout; no  atrophy noted  Sensory: Pinprick and light touch intact throughout, bilaterally but has decreased sensation to cold temperature from his toes to mid calf. vibratory sensation is decreased  Deep Tendon Reflexes: no DTR in ankles and knees, 1+ in bilateral UE  Plantars:  Right: downgoing  Left: downgoing     Lab Results: Lab Results  Component Value Date/Time   CHOL 170 03/05/2012  4:32 PM   Lipid Panel No results found for this basename: CHOL, TRIG, HDL, CHOLHDL, VLDL, LDLCALC,  in the last 72 hours  Studies/Results: Dg Chest 2 View  03/15/2013   *RADIOLOGY REPORT*  Clinical Data: Weakness  CHEST - 2 VIEW  Comparison: 05/04/2012  Findings: Linear left lower lobe atelectasis or scarring noted. Lung volumes are low with crowding of the bronchovascular markings, but slightly improved since the prior exam.  Cardiac leads obscure detail.  Heart size upper limits of normal.  No pleural effusion.  IMPRESSION: Low volume exam with crowding of the bronchovascular markings but no acute finding allowing for this. If the patient's symptoms continue, consider PA and lateral chest radiographs obtained at full inspiration when the patient is clinically able.   Original Report Authenticated By: Christiana Pellant, M.D.   Ct Head Wo Contrast  03/15/2013   *RADIOLOGY REPORT*  Clinical Data: Acute onset of weakness; slumped into kneeling position.  CT HEAD WITHOUT CONTRAST  Technique:  Contiguous axial images were obtained from the base of the skull through the vertex without contrast.  Comparison: CT of the head performed 08/06/2012, and MRI of the brain performed 05/05/2012  Findings: There is no evidence of acute infarction, mass lesion, or intra- or extra-axial hemorrhage on CT.  Prominence of the ventricles and sulci reflects mild cortical volume loss.  Cerebellar atrophy  is noted.  Diffuse periventricular and subcortical white matter change likely reflects small vessel ischemic microangiopathy.  Chronic ischemic change is noted at the right basal ganglia and left anterior corona radiata. Postoperative change is seen at the right MCA circulation.  The brainstem and fourth ventricle are within normal limits.  The cerebral hemispheres demonstrate grossly normal gray-white differentiation.  No mass effect or midline shift is seen.  There is no evidence of fracture; a right frontoparietal craniotomy flap is noted, with overlying soft tissue scarring.  The visualized portions of the orbits are within normal limits.  The paranasal sinuses and mastoid air cells are well-aerated.  No significant soft tissue abnormalities are seen.  IMPRESSION:  1.  No acute intracranial pathology seen on CT. 2.  Mild cortical volume loss and diffuse small vessel ischemic microangiopathy. 3.  Chronic ischemic change at the right basal ganglia and left anterior corona radiata. 4.  Postoperative changes noted, with right frontoparietal craniotomy flap.   Original Report Authenticated By: Tonia Ghent, M.D.   Mr Brain Wo Contrast  03/17/2013   *RADIOLOGY REPORT*  Clinical Data:  Increasing inability to walk.  Declining memory. Previous strokes. Diabetic hypertensive hyperlipidemic patient. History of right middle cerebral artery bifurcation aneurysm.  MRI HEAD WITHOUT CONTRAST MRI CERVICAL SPINE WITHOUT CONTRAST  Technique:  Multiplanar, multiecho pulse sequences of the brain and surrounding structures, and cervical spine, to include the craniocervical junction and cervicothoracic junction, were obtained without intravenous contrast.  Comparison:  03/15/2013 CT.  05/05/2012 MR.  04/23/2008 catheter angiogram.  No comparison cervical spine MR.  MRI HEAD  Findings:  No acute infarct.  Prior right frontal craniotomy for clipping of right middle cerebral artery bifurcation aneurysm.  Artifact at this level.  No  intracranial hemorrhage.  Prior basal ganglia, caudate and thalamic infarcts.  Moderate white matter type changes consistent with result of small vessel disease. Altered signal intensity within the medulla probably related to prior infarcts/small vessel disease type changes.  Global atrophy without hydrocephalus.  No intracranial mass lesion detected on this unenhanced exam.  Cervical medullary junction, pituitary pineal region unremarkable. Mild exophthalmos.  Major intracranial vascular structures are patent.  Limited for detection of aneurysm.  Minimal mucosal thickening right maxillary sinus.  IMPRESSION: No acute infarct.  Prior right frontal craniotomy for clipping of right middle cerebral artery bifurcation aneurysm.  Artifact at this level.  No intracranial hemorrhage.  Prior basal ganglia, caudate and thalamic infarcts.  Moderate white matter type changes consistent with result of small vessel disease.  Global atrophy without hydrocephalus.  MRI CERVICAL SPINE  Findings: Motion degraded exam.  Cervical medullary junction unremarkable.  No definitive focal cervical cord signal abnormality.  Visualized paravertebral structures unremarkable.  Both vertebral arteries are patent.  C2-3:  Minimal bulge greater to the left.  C3-4:  Moderate broad-based disc osteophyte complex.  Buckling posterior ligaments.  Moderate spinal stenosis with cord flattening.  Within the compressed cord, no definitive evidence of gliosis and / or edema.  Marked bilateral foraminal narrowing.  C4-5:  Bulge/broad-based protrusion minimally more notable to the right.  Spinal stenosis and mild cord flattening greater on the right.  Uncinate bony overgrowth.  Moderate bilateral foraminal narrowing greater on the right.  C5-6:  Broad-based disc osteophyte complex greater to the right. Mild spinal stenosis greater on the right.  Minimal cord contact on the right.  Uncinate hypertrophy.  Moderate bilateral foraminal narrowing.  C6-7:  Minimal  bulge.  Uncinate hypertrophy with mild right-sided and mild to moderate left-sided foraminal narrowing.  C7-T1:  Minimal bulge.  Minimal uncinate hypertrophy.  Minimal foraminal narrowing greater on the right.  T1-2:  Small Schmorl's node deformities superior plate T2.  IMPRESSION: Motion degraded exam.  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   Original Report Authenticated By: Lacy Duverney, M.D.   Mr Cervical Spine Wo Contrast  03/17/2013   *RADIOLOGY REPORT*  Clinical Data:  Increasing inability to walk.  Declining memory. Previous strokes. Diabetic hypertensive hyperlipidemic patient. History of right middle cerebral artery bifurcation aneurysm.  MRI HEAD WITHOUT CONTRAST MRI CERVICAL SPINE WITHOUT CONTRAST  Technique:  Multiplanar, multiecho pulse sequences of the brain and surrounding structures, and cervical spine, to include the craniocervical junction and cervicothoracic junction, were obtained without intravenous contrast.  Comparison:  03/15/2013 CT.  05/05/2012 MR.  04/23/2008 catheter angiogram.  No comparison cervical spine MR.  MRI HEAD  Findings:  No acute infarct.  Prior right frontal craniotomy for clipping of right middle cerebral artery bifurcation aneurysm.  Artifact at this level.  No intracranial hemorrhage.  Prior basal ganglia, caudate and thalamic infarcts.  Moderate white matter type changes consistent with result of small vessel disease. Altered signal intensity within the medulla probably related to prior infarcts/small vessel disease type changes.  Global atrophy without hydrocephalus.  No intracranial mass lesion detected on this unenhanced exam.  Cervical medullary junction, pituitary pineal region unremarkable. Mild exophthalmos.  Major intracranial vascular structures are patent.  Limited for detection of aneurysm.  Minimal mucosal thickening right maxillary  sinus.  IMPRESSION: No acute infarct.  Prior right frontal craniotomy for clipping of right middle cerebral artery bifurcation aneurysm.  Artifact at this level.  No intracranial hemorrhage.  Prior basal ganglia, caudate and thalamic infarcts.  Moderate white matter type changes consistent with result of small vessel disease.  Global atrophy without hydrocephalus.  MRI CERVICAL SPINE  Findings: Motion degraded exam.  Cervical medullary junction unremarkable.  No definitive focal cervical cord signal abnormality.  Visualized paravertebral structures unremarkable.  Both vertebral arteries are patent.  C2-3:  Minimal bulge greater to the left.  C3-4:  Moderate broad-based disc osteophyte complex.  Buckling posterior ligaments.  Moderate spinal stenosis with cord flattening.  Within the compressed cord, no definitive evidence of gliosis and / or edema.  Marked bilateral foraminal narrowing.  C4-5:  Bulge/broad-based protrusion minimally more notable to the right.  Spinal stenosis and mild cord flattening greater on the right.  Uncinate bony overgrowth.  Moderate bilateral foraminal narrowing greater on the right.  C5-6:  Broad-based disc osteophyte complex greater to the right. Mild spinal stenosis greater on the right.  Minimal cord contact on the right.  Uncinate hypertrophy.  Moderate bilateral foraminal narrowing.  C6-7:  Minimal bulge.  Uncinate hypertrophy with mild right-sided and mild to moderate left-sided foraminal narrowing.  C7-T1:  Minimal bulge.  Minimal uncinate hypertrophy.  Minimal foraminal narrowing greater on the right.  T1-2:  Small Schmorl's node deformities superior plate T2.  IMPRESSION: Motion degraded exam.  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   Original Report Authenticated By: Lacy Duverney, M.D.    MEDICATIONS                                                                                                                         Scheduled: . amLODipine  10 mg Oral Daily  . atorvastatin  20 mg Oral q1800  . carvedilol  12.5 mg Oral BID WC  . clopidogrel  75 mg Oral Q breakfast  . enoxaparin (LOVENOX) injection  40 mg Subcutaneous Q24H  . furosemide  80 mg Oral Daily  . insulin glargine  24 Units Subcutaneous QHS  . lisinopril  20 mg Oral Daily  . sodium chloride  3 mL Intravenous Q12H  . sodium chloride  3 mL Intravenous Q12H  . spironolactone  25 mg Oral Daily    ASSESSMENT/PLAN:                                                                                                            72 year old male with a history  of falls progressive for the past 6 months. Suspect that his gait disorder is multifactorial in nature including previous strokes, peripheral neuropathy, possible dementia. Continues to be unclear what caused him to go down slowly, or if he is having lightheadedness during the episode. MRI shows cervical stenosis which can certainly contribute to falls, and his peripheral neuropathy may mask hyporeflexia secondary to this.  His gait could be parkinsonian, but no other features on exam to suggest this. I suspect multifactorial dysfunction as opposed to parkinsonism, though if abilify did start just prior to his worsening, then this may need to be considered as a possible contributor. RPR and B12 normal.   Discussed with resident that patient has significant C-spine stenosis and would recommend discussion with neurosurgery.    No further neurological diagnostic testing recommended while in hospital.   Recommend:  1) physical therapy  2) Follow up with Dr. Pearlean Brownie out patient.   Assessment and plan discussed with with attending physician and they are in agreement.    Felicie Morn PA-C [facility] [phone]  03/17/2013, 9:47 AM   MRI reviewed. Peripheral neuropathy is confounding his exam, as it may be masking hyperreflexia. His  progressive gait disturbance could be related to spinal stenosis, and it may be worthwhile to ask for neurosurgery input, though do not feel that this is mandated as inpatient. If spinal stenosis is contributing, I feel it is one factor of a multifactorial gait dysfunction including previous strokes and peripheral neuropathy.  Ritta Slot, MD [facility] [phone]  If 7pm- 7am, please page neurology on call at [phone].

## 2013-03-17 NOTE — Progress Notes (Signed)
Internal Medicine Attending  Date: 03/17/2013  Patient name: [name] Medical record number: [id number] Date of birth: [birth year] Age: 72 y.o. Gender: male  I saw and evaluated the patient. I reviewed the resident's note by Dr. Lavena Bullion and I agree with the resident's findings and plans as documented in his note.

## 2013-03-17 NOTE — Progress Notes (Signed)
Inpatient Diabetes Program Recommendations  AACE/ADA: New Consensus Statement on Inpatient Glycemic Control (2013)  Target Ranges:  Prepandial:   less than 140 mg/dL      Peak postprandial:   less than 180 mg/dL (1-2 hours)      Critically ill patients:  140 - 180 mg/dL     Results for WILFRED, DAYRIT (MRN [id number]) as of 03/17/2013 09:25  Ref. Range 03/16/2013 07:33 03/16/2013 11:34 03/16/2013 16:22 03/16/2013 20:54  Glucose-Capillary Latest Range: 70-99 mg/dL 98 829 (H) 562 (H) 130 (H)    Elevated CBGs yesterday afternoon.  MD- Please add Novolog Sensitive correction scale tid ac + HS  Will follow. Ambrose Finland RN, MSN, CDE Diabetes Coordinator Inpatient Diabetes Program [phone]

## 2013-03-17 NOTE — Progress Notes (Signed)
Utilization review completed.  

## 2013-03-17 NOTE — Progress Notes (Addendum)
Clinical Social Work Department CLINICAL SOCIAL WORK PLACEMENT NOTE 03/17/2013  Patient:  KAMILO, OCH  Account Number:  [id number] Admit date:  03/15/2013  Clinical Social Worker:  Sherald Barge, Theresia Majors  Date/time:  03/17/2013 09:00 AM  Clinical Social Work is seeking post-discharge placement for this patient at the following level of care:   SKILLED NURSING   (*CSW will update this form in Epic as items are completed)   03/17/2013  Patient/family provided with [facility] Department of Clinical Social Work's list of facilities offering this level of care within the geographic area requested by the patient (or if unable, by the patient's family).  03/17/2013  Patient/family informed of their freedom to choose among providers that offer the needed level of care, that participate in Medicare, Medicaid or managed care program needed by the patient, have an available bed and are willing to accept the patient.  03/17/2013  Patient/family informed of [facility]' ownership interest in [facility], as well as of the fact that they are under no obligation to receive care at this facility.  PASARR submitted to EDS on 03/17/2013 PASARR number received from [employer] on 03/17/2013  FL2 transmitted to all facilities in geographic area requested by pt/family on  03/17/2013 FL2 transmitted to all facilities within larger geographic area on 03/17/2013  Patient informed that his/her managed care company has contracts with or will negotiate with  certain facilities, including the following:     Patient/family informed of bed offers received: 03/18/2013  Patient chooses bed at [facility] and [facility] Physician recommends and patient chooses bed at    Patient to be transferred to [facility] on 03/18/13.   Patient to be transferred to facility by [facility].   Additional Comments: [employer] approved the pt: Authorization Number: [id number].  Sherald Barge, LCSW-A Clinical Social Worker [phone]

## 2013-03-17 NOTE — Progress Notes (Signed)
Patient refused cpap tonight. RT will continue to monitor. 

## 2013-03-18 ENCOUNTER — Encounter: Payer: Self-pay | Admitting: Internal Medicine

## 2013-03-18 LAB — GLUCOSE, CAPILLARY: Glucose-Capillary: 128 mg/dL — ABNORMAL HIGH (ref 70–99)

## 2013-03-18 MED ORDER — CARVEDILOL 6.25 MG PO TABS: 18.7500 mg | ORAL_TABLET | Freq: Two times a day (BID) | ORAL | Status: DC

## 2013-03-18 MED ORDER — CARVEDILOL 6.25 MG PO TABS
18.7500 mg | ORAL_TABLET | Freq: Two times a day (BID) | ORAL | Status: DC
  Filled 2013-03-18 (×2): qty 1

## 2013-03-18 MED ORDER — INSULIN ASPART 100 UNIT/ML ~~LOC~~ SOLN
0.0000 [IU] | Freq: Three times a day (TID) | SUBCUTANEOUS | Status: DC
  Administered 2013-03-18: 1 [IU] via SUBCUTANEOUS
  Administered 2013-03-18: 2 [IU] via SUBCUTANEOUS

## 2013-03-18 NOTE — Evaluation (Signed)
Physical Therapy Evaluation Patient Details Name: [name] MRN: [id number] DOB: 24-Dec-1940 Today's Date: 03/18/2013 Time: 4098-1191 PT Time Calculation (min): 32 min  PT Assessment / Plan / Recommendation Clinical Impression   Pt is a  72 y.o. 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  Pt demonstrates deficits in functional mobility as indicated below. Feel patient will benefit from skilled PT to address deficits and maximize function.  Feel patient will benefit from intense rehab prior to discharge. Rec patient as candidate for CIR at discharge.  WIll continue to see as indicated.     PT Assessment  Patient needs continued PT services    Follow Up Recommendations  CIR    Does the patient have the potential to tolerate intense rehabilitation    Yes     Equipment Recommendations  None recommended by PT    Recommendations for Other Services Rehab consult   Frequency Min 3X/week    Precautions / Restrictions Precautions Precautions: Fall   Pertinent Vitals/Pain No pain reported at beginning of session      Mobility  Bed Mobility Bed Mobility: Rolling Right;Right Sidelying to Sit;Sitting - Scoot to [place] of Bed Rolling Right: 4: Min guard Right Sidelying to Sit: 4: Min guard Sitting - Scoot to [place] of Bed: 4: Min assist Details for Bed Mobility Assistance: VCs for hip rotation, assist with chuck pad to rotate Transfers Transfers: Sit to Stand;Stand to Sit Sit to Stand: 4: Min assist;From elevated surface;From bed Stand to Sit: 4: Min assist;To chair/3-in-1 Details for Transfer Assistance: VCs for hand placement, initiation and body positioning, Assist for elevation, stability, and controlled movements Ambulation/Gait Ambulation/Gait Assistance:  4: Min guard;4: Min Environmental consultant (Feet): 16 Feet Assistive device: Rolling walker Ambulation/Gait Assistance Details: Patient reuires some manual assist with RW and verbal cues for proper use, assist for stability and attention to left. Gait Pattern: Step-to pattern;Decreased stride length;Decreased weight shift to left;Shuffle;Trunk flexed;Narrow base of support Gait velocity: decreased General Gait Details: instability with gait Stairs: No    Exercises  Ankle Pumps; bilaterally 20   PT Diagnosis: Difficulty walking;Generalized weakness  PT Problem List: Decreased strength;Decreased range of motion;Decreased activity tolerance;Decreased balance;Decreased mobility;Decreased knowledge of use of DME;Decreased safety awareness;Pain PT Treatment Interventions: DME instruction;Gait training;Stair training;Functional mobility training;Therapeutic activities;Therapeutic exercise;Patient/family education   PT Goals Acute Rehab PT Goals PT Goal Formulation: With patient Time For Goal Achievement: 04/01/13 Potential to Achieve Goals: Fair Pt will go Sit to Stand: with modified independence PT Goal: Sit to Stand - Progress: Goal set today Pt will Transfer Bed to Chair/Chair to Bed: with modified independence PT Transfer Goal: Bed to Chair/Chair to Bed - Progress: Goal set today Pt will Ambulate: >150 feet;with modified independence PT Goal: Ambulate - Progress: Goal set today Pt will Go Up / Down Stairs: 3-5 stairs;with min assist PT Goal: Up/Down Stairs - Progress: Goal set today  Visit Information  Last PT Received On: 03/18/13 Assistance Needed: +1    Subjective Data  Subjective: My wife says i can't dress myself but i think I can Patient Stated Goal: none stated at this time   Prior Functioning  Home Living Lives With: Spouse Available Help at Discharge: Family;Available 24 hours/day Type of Home: House Home Access: Stairs to enter [employer] of Steps:  3  Entrance Stairs-Rails: Right Home Layout: One level Bathroom Shower/Tub: Tub/shower unit;Curtain Firefighter: Standard Home Adaptive Equipment: Shower chair with back;Straight cane;Walker - rolling;Wheelchair - manual Prior Function Level of Independence: Independent with assistive device(s) Able to Take Stairs?: Yes Driving: Yes Vocation: Retired Musician: [employer] Dominant Hand: Right    Cognition  Cognition Arousal/Alertness: Awake/alert Behavior During Therapy: Flat affect Overall Cognitive Status: No family/caregiver present to determine baseline cognitive functioning Area of Impairment: Attention;Following commands;Awareness;Problem solving Current Attention Level: Sustained Following Commands: Follows multi-step commands inconsistently Awareness: Intellectual Problem Solving: Slow processing;Decreased initiation;Requires verbal cues;Requires tactile cues General Comments: Pt demonstrates some inattention to Left side, decreased attention to the left side    Extremity/Trunk Assessment Right Upper Extremity Assessment RUE ROM/Strength/Tone: Deficits Left Upper Extremity Assessment LUE ROM/Strength/Tone: Deficits LUE ROM/Strength/Tone Deficits: Limited ROM prior CVA Right Lower Extremity Assessment RLE ROM/Strength/Tone: WFL for tasks assessed RLE Coordination: WFL - gross/fine motor Left Lower Extremity Assessment LLE ROM/Strength/Tone: WFL for tasks assessed LLE Coordination: WFL - gross/fine motor   Balance Balance Balance Assessed: Yes Static Sitting Balance Static Sitting - Balance Support: Feet supported Static Sitting - Level of Assistance: 5: Stand by assistance Static Sitting - Comment/# of Minutes: trunk lean to the right; able to correct with cues Static Standing Balance Static Standing - Balance Support: Bilateral upper extremity supported;During functional activity Static Standing - Level of Assistance: 5: Stand by assistance Static  Standing - Comment/# of Minutes: performing hygiene tasks at counter  End of Session PT - End of Session Equipment Utilized During Treatment: Gait belt Activity Tolerance: Patient tolerated treatment well Patient left: in chair;with call bell/phone within reach Nurse Communication: Mobility status  GP     Fabio Asa 03/18/2013, 10:00 AM Charlotte Crumb, PT DPT  [phone]

## 2013-03-18 NOTE — Progress Notes (Signed)
CM spoke to CSW and pt has a bed available at [facility]. Per CSW [facility] was the pt's first choice. Pt is agreeable to SNF. CM did see a consult for CIR. CM did speak to Liaison for CIR Britta Mccreedy and she stated that the MD for CIR probably would not see the pt until later this evening and that insurance probably would  not authorize CIR being late in the  evening. Pt is a three day observation case. If pt can not be approved for CIR today pt will have a delay in D/C for insurance authorization and pt will be looking at possibly having to pay out of pocket. Resident stated that pt is medically stable for d/c at this point. CM will also discuss case with Physician Advisor. Gala Lewandowsky, RN,BSN  Care Manager [phone]

## 2013-03-18 NOTE — Evaluation (Addendum)
Occupational Therapy Evaluation Patient Details Name: [name] MRN: [id number] DOB: 1941/08/28 Today's Date: 03/18/2013 Time: 4098-1191 OT Time Calculation (min): 41 min  OT Assessment / Plan / Recommendation Clinical Impression  Patient has a history of chronic gait instability with prior falls, and is now admitted following a fall sustained at home.  He had no apparent injury, and he denies loss of consciousness.  He describes his falling events as his knees "giving way".  This is likely a multifactorial problem, with contribution from h/o dementia and CVA with  left sided weakness, deconditioning, DJD, and neuropathy. Patient demonstrates deficits in functional mobility as indicated below. Patient will benefit from intense rehab therapy prior to discharge therefore recommending CIR setting.  Will continue to see as indicated.   OT Assessment  Patient needs continued OT Services    Follow Up Recommendations  CIR    Barriers to Discharge Decreased caregiver support    Equipment Recommendations   (TBA)    Recommendations for Other Services Rehab consult  Frequency  Min 3X/week    Precautions / Restrictions Precautions Precautions: Fall Precaution Comments: left visual inattention (h/o cva), h/o falls "knees buckle" Restrictions Weight Bearing Restrictions: No   Pertinent Vitals/Pain Denies pain except one time reported right knee pain when scooting EOB.  Repositioned and no further reports of pain during standing or ambulating    ADL  Grooming: Performed;Wash/dry hands;Wash/dry face;Min guard Where Assessed - Grooming: Unsupported standing Upper Body Bathing: Simulated;Minimal assistance Where Assessed - Upper Body Bathing: Supported sitting Lower Body Bathing: Simulated;Moderate assistance Where Assessed - Lower Body Bathing: Supported sit to stand Upper Body Dressing: Simulated;Minimal assistance Where Assessed - Upper Body Dressing: Supported sitting Lower Body  Dressing: Simulated;Performed;Moderate assistance Where Assessed - Lower Body Dressing: Supported sit to stand Transfers/Ambulation Related to ADLs: Requires cues for technique, hand placement, decreased visual attention to left with functional mobility, bed and recliner stand step transfers with RW and BDL tasks    OT Diagnosis: Generalized weakness;Disturbance of vision;Hemiplegia non-dominant side  OT Problem List: Decreased strength;Decreased range of motion;Decreased activity tolerance;Impaired balance (sitting and/or standing);Impaired vision/perception;Decreased safety awareness;Impaired UE functional use OT Treatment Interventions: Self-care/ADL training;Neuromuscular education;Energy conservation;DME and/or AE instruction;Therapeutic activities;Visual/perceptual remediation/compensation;Patient/family education;Balance training   OT Goals Acute Rehab OT Goals OT Goal Formulation: With patient Time For Goal Achievement: 04/01/13 Potential to Achieve Goals: Good ADL Goals Pt Will Perform Grooming: with supervision;Standing at sink ADL Goal: Grooming - Progress: Goal set today Pt Will Perform Upper Body Bathing: with supervision;Sitting at sink ADL Goal: Upper Body Bathing - Progress: Goal set today Pt Will Perform Lower Body Bathing: with supervision;Sit to stand from chair ADL Goal: Lower Body Bathing - Progress: Goal set today Pt Will Perform Upper Body Dressing: with supervision;Sitting, chair ADL Goal: Upper Body Dressing - Progress: Goal set today Pt Will Transfer to Toilet: with supervision;Regular height toilet ADL Goal: Toilet Transfer - Progress: Goal set today Pt Will Perform Toileting - Clothing Manipulation: with supervision;Standing ADL Goal: Toileting - Clothing Manipulation - Progress: Goal set today Pt Will Perform Toileting - Hygiene: with supervision;Sitting on 3-in-1 or toilet;Standing at 3-in-1/toilet ADL Goal: Toileting - Hygiene - Progress: Goal set  today  Visit Information  Last OT Received On: 03/18/13 Assistance Needed: +1 PT/OT Co-Evaluation/Treatment: Yes    Subjective Data  Subjective: Patient reports that she used to be able to clean her house in one day and for the last 6 months,now she can only clean about half of on room in a  day Patient Stated Goal: "I want to be able to take care of myself"   Prior Functioning     Home Living Lives With: Spouse Available Help at Discharge: Family;Available 24 hours/day Type of Home: House Home Access: Stairs to enter [employer] of Steps: 3 Entrance Stairs-Rails: Right Home Layout: One level Bathroom Shower/Tub: Forensic scientist: Standard Home Adaptive Equipment: Shower chair with back;Straight cane;Walker - rolling;Wheelchair - manual Prior Function Level of Independence: Independent with assistive device(s) (Pt. reports that wife would say he cannot dress himself) Able to Take Stairs?: Yes Driving: Yes Vocation: Retired Musician: HOH Dominant Hand: Right         Vision/Perception Vision - History Baseline Vision: Wears glasses all the time Patient Visual Report: No change from baseline Vision - Assessment Vision Assessment: Vision impaired - to be further tested in functional context Perception Perception: Impaired Inattention/Neglect: Does not attend to left visual field   Cognition  Cognition Arousal/Alertness: Awake/alert Behavior During Therapy: Flat affect Overall Cognitive Status: No family/caregiver present to determine baseline cognitive functioning Area of Impairment: Attention;Following commands;Awareness;Problem solving Current Attention Level: Sustained Following Commands: Follows multi-step commands inconsistently Awareness: Intellectual Problem Solving: Slow processing;Decreased initiation;Requires verbal cues;Requires tactile cues General Comments: Pt demonstrates some inattention to Left  side, decreased attention to the left side    Extremity/Trunk Assessment Right Upper Extremity Assessment RUE ROM/Strength/Tone: Deficits RUE ROM/Strength/Tone Deficits: AROM and strength WFL distal to shoulder, shoulder AROM ~90 and strength 3-/5 Left Upper Extremity Assessment LUE ROM/Strength/Tone: Deficits LUE ROM/Strength/Tone Deficits: Limited shoulder A/PROM (h/o prior CVA)  Distal to shoulder grossly WFL Right Lower Extremity Assessment RLE ROM/Strength/Tone: WFL for tasks assessed RLE Coordination: WFL - gross/fine motor Left Lower Extremity Assessment LLE ROM/Strength/Tone: WFL for tasks assessed LLE Coordination: WFL - gross/fine motor     Mobility Bed Mobility Bed Mobility: Rolling Right;Right Sidelying to Sit;Sitting - Scoot to [place] of Bed Rolling Right: 4: Min guard Right Sidelying to Sit: 4: Min guard Sitting - Scoot to [employer] of Bed: 4: Min assist Details for Bed Mobility Assistance: VCs for hip rotation, assist with chuck pad to rotate Transfers Sit to Stand: 4: Min assist;From elevated surface;From bed Stand to Sit: 4: Min assist;To chair/3-in-1 Details for Transfer Assistance: VCs for hand placement, initiation and body positioning, Assist for elevation, stability, and controlled movements     Balance Balance Balance Assessed: Yes Static Sitting Balance Static Sitting - Balance Support: Feet supported Static Sitting - Level of Assistance: 5: Stand by assistance Static Sitting - Comment/# of Minutes: trunk lean to the right; able to correct with cues Static Standing Balance Static Standing - Balance Support: Bilateral upper extremity supported;During functional activity Static Standing - Level of Assistance: 5: Stand by assistance Static Standing - Comment/# of Minutes: performing hygiene tasks at counter   End of Session OT - End of Session Equipment Utilized During Treatment: Gait belt Activity Tolerance: Patient limited by fatigue Patient left: in  chair;with call bell/phone within reach  GO     Olajuwon Fosdick 03/18/2013, 11:51 AM

## 2013-03-18 NOTE — Progress Notes (Signed)
Subjective:    Pt denies any complaints this AM. States he is feeling well.   Interval Events: No acute events.    Objective:    Vital Signs:   Temp:  [98.3 F (36.8 C)-99 F (37.2 C)] 99 F (37.2 C) (06/04 0536) Pulse Rate:  [72-102] 79 (06/04 0538) BP: (146-186)/(60-90) 156/79 mmHg (06/04 0538) SpO2:  [95 %-99 %] 95 % (06/04 0538) Weight:  [200 lb 12.8 oz (91.082 kg)] 200 lb 12.8 oz (91.082 kg) (06/04 0536) Last BM Date: 03/16/13  24-hour weight change: Weight change: -5 lb 15.9 oz (-2.718 kg)  Intake/Output:   Intake/Output Summary (Last 24 hours) at 03/18/13 0835 Last data filed at 03/18/13 0537  Gross per 24 hour  Intake    240 ml  Output   1100 ml  Net   -860 ml      Physical Exam: General: Vital signs reviewed and noted. Well-developed, well-nourished, in no acute distress; alert, appropriate and cooperative throughout examination.  Lungs: Normal respiratory effort. Clear to auscultation BL without crackles or wheezes.  Heart: RRR. S1 and S2 normal.  Abdomen: BS normoactive. Soft, Nondistended, non-tender. No masses or organomegaly.  Extremities: No pretibial edema.  Neuro: 4/5 strength in LUE, 5/5 strength in RUE, LLE, RLE. AAO x 3.    Labs:  Basic Metabolic Panel:  Recent Labs Lab 03/15/13 2045  NA 142  K 4.3  CL 105  CO2 27  GLUCOSE 172*  BUN 18  CREATININE 1.16  CALCIUM 9.9  MG 2.1    Liver Function Tests:  Recent Labs Lab 03/15/13 2045  AST 21  ALT 19  ALKPHOS 67  BILITOT 0.6  PROT 7.3  ALBUMIN 3.9    CBC:  Recent Labs Lab 03/15/13 2002  WBC 6.8  NEUTROABS 4.9  HGB 12.2*  HCT 38.7*  MCV 82.0  PLT 187    Cardiac Enzymes:  Recent Labs Lab 03/16/13 0515  CKTOTAL 382*    CBG:  Recent Labs Lab 03/17/13 0749 03/17/13 1126 03/17/13 1656 03/17/13 2223 03/18/13 0722  GLUCAP 141* 166* 279* 196* 128*    Microbiology: Results for orders placed during the hospital encounter of 05/05/12  URINE CULTURE      Status: None   Collection Time    05/06/12 10:21 AM      Result Value Range Status   Specimen Description URINE, CLEAN CATCH   Final   Special Requests NONE   Final   Culture  Setup Time 05/06/2012 11:17   Final   Colony Count 3,000 COLONIES/ML   Final   Culture INSIGNIFICANT GROWTH   Final   Report Status 05/07/2012 FINAL   Final    Imaging: Mr Brain Wo Contrast  03/17/2013   *RADIOLOGY REPORT*  Clinical Data:  Increasing inability to walk.  Declining memory. Previous strokes. Diabetic hypertensive hyperlipidemic patient. History of right middle cerebral artery bifurcation aneurysm.  MRI HEAD WITHOUT CONTRAST MRI CERVICAL SPINE WITHOUT CONTRAST  Technique:  Multiplanar, multiecho pulse sequences of the brain and surrounding structures, and cervical spine, to include the craniocervical junction and cervicothoracic junction, were obtained without intravenous contrast.  Comparison:  03/15/2013 CT.  05/05/2012 MR.  04/23/2008 catheter angiogram.  No comparison cervical spine MR.  MRI HEAD  Findings:  No acute infarct.  Prior right frontal craniotomy for clipping of right middle cerebral artery bifurcation aneurysm.  Artifact at this level.  No intracranial hemorrhage.  Prior basal ganglia, caudate and thalamic infarcts.  Moderate white matter type changes consistent  with result of small vessel disease. Altered signal intensity within the medulla probably related to prior infarcts/small vessel disease type changes.  Global atrophy without hydrocephalus.  No intracranial mass lesion detected on this unenhanced exam.  Cervical medullary junction, pituitary pineal region unremarkable. Mild exophthalmos.  Major intracranial vascular structures are patent.  Limited for detection of aneurysm.  Minimal mucosal thickening right maxillary sinus.  IMPRESSION: No acute infarct.  Prior right frontal craniotomy for clipping of right middle cerebral artery bifurcation aneurysm.  Artifact at this level.  No intracranial  hemorrhage.  Prior basal ganglia, caudate and thalamic infarcts.  Moderate white matter type changes consistent with result of small vessel disease.  Global atrophy without hydrocephalus.  MRI CERVICAL SPINE  Findings: Motion degraded exam.  Cervical medullary junction unremarkable.  No definitive focal cervical cord signal abnormality.  Visualized paravertebral structures unremarkable.  Both vertebral arteries are patent.  C2-3:  Minimal bulge greater to the left.  C3-4:  Moderate broad-based disc osteophyte complex.  Buckling posterior ligaments.  Moderate spinal stenosis with cord flattening.  Within the compressed cord, no definitive evidence of gliosis and / or edema.  Marked bilateral foraminal narrowing.  C4-5:  Bulge/broad-based protrusion minimally more notable to the right.  Spinal stenosis and mild cord flattening greater on the right.  Uncinate bony overgrowth.  Moderate bilateral foraminal narrowing greater on the right.  C5-6:  Broad-based disc osteophyte complex greater to the right. Mild spinal stenosis greater on the right.  Minimal cord contact on the right.  Uncinate hypertrophy.  Moderate bilateral foraminal narrowing.  C6-7:  Minimal bulge.  Uncinate hypertrophy with mild right-sided and mild to moderate left-sided foraminal narrowing.  C7-T1:  Minimal bulge.  Minimal uncinate hypertrophy.  Minimal foraminal narrowing greater on the right.  T1-2:  Small Schmorl's node deformities superior plate T2.  IMPRESSION: Motion degraded exam.  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   Original Report Authenticated By: Lacy Duverney, M.D.   Mr Cervical Spine Wo Contrast  03/17/2013   *RADIOLOGY REPORT*  Clinical Data:  Increasing inability to walk.  Declining memory. Previous strokes. Diabetic hypertensive hyperlipidemic patient. History of right middle cerebral artery bifurcation  aneurysm.  MRI HEAD WITHOUT CONTRAST MRI CERVICAL SPINE WITHOUT CONTRAST  Technique:  Multiplanar, multiecho pulse sequences of the brain and surrounding structures, and cervical spine, to include the craniocervical junction and cervicothoracic junction, were obtained without intravenous contrast.  Comparison:  03/15/2013 CT.  05/05/2012 MR.  04/23/2008 catheter angiogram.  No comparison cervical spine MR.  MRI HEAD  Findings:  No acute infarct.  Prior right frontal craniotomy for clipping of right middle cerebral artery bifurcation aneurysm.  Artifact at this level.  No intracranial hemorrhage.  Prior basal ganglia, caudate and thalamic infarcts.  Moderate white matter type changes consistent with result of small vessel disease. Altered signal intensity within the medulla probably related to prior infarcts/small vessel disease type changes.  Global atrophy without hydrocephalus.  No intracranial mass lesion detected on this unenhanced exam.  Cervical medullary junction, pituitary pineal region unremarkable. Mild exophthalmos.  Major intracranial vascular structures are patent.  Limited for detection of aneurysm.  Minimal mucosal thickening right maxillary sinus.  IMPRESSION: No acute infarct.  Prior right frontal craniotomy for clipping of right middle cerebral artery bifurcation aneurysm.  Artifact at this level.  No intracranial hemorrhage.  Prior basal ganglia, caudate and thalamic infarcts.  Moderate white  matter type changes consistent with result of small vessel disease.  Global atrophy without hydrocephalus.  MRI CERVICAL SPINE  Findings: Motion degraded exam.  Cervical medullary junction unremarkable.  No definitive focal cervical cord signal abnormality.  Visualized paravertebral structures unremarkable.  Both vertebral arteries are patent.  C2-3:  Minimal bulge greater to the left.  C3-4:  Moderate broad-based disc osteophyte complex.  Buckling posterior ligaments.  Moderate spinal stenosis with cord  flattening.  Within the compressed cord, no definitive evidence of gliosis and / or edema.  Marked bilateral foraminal narrowing.  C4-5:  Bulge/broad-based protrusion minimally more notable to the right.  Spinal stenosis and mild cord flattening greater on the right.  Uncinate bony overgrowth.  Moderate bilateral foraminal narrowing greater on the right.  C5-6:  Broad-based disc osteophyte complex greater to the right. Mild spinal stenosis greater on the right.  Minimal cord contact on the right.  Uncinate hypertrophy.  Moderate bilateral foraminal narrowing.  C6-7:  Minimal bulge.  Uncinate hypertrophy with mild right-sided and mild to moderate left-sided foraminal narrowing.  C7-T1:  Minimal bulge.  Minimal uncinate hypertrophy.  Minimal foraminal narrowing greater on the right.  T1-2:  Small Schmorl's node deformities superior plate T2.  IMPRESSION: Motion degraded exam.  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   Original Report Authenticated By: Lacy Duverney, M.D.       Medications:    Infusions:    Scheduled Medications: . amLODipine  10 mg Oral Daily  . atorvastatin  20 mg Oral q1800  . carvedilol  12.5 mg Oral BID WC  . clopidogrel  75 mg Oral Q breakfast  . enoxaparin (LOVENOX) injection  40 mg Subcutaneous Q24H  . furosemide  80 mg Oral Daily  . insulin aspart  0-9 Units Subcutaneous TID WC  . insulin glargine  24 Units Subcutaneous QHS  . lisinopril  20 mg Oral Daily  . sodium chloride  3 mL Intravenous Q12H  . sodium chloride  3 mL Intravenous Q12H  . spironolactone  25 mg Oral Daily    PRN Medications: sodium chloride, sodium chloride   Assessment/ Plan:   Pt is a 72 y.o. yo male with a PMHx of CVA with L-sided weakness, dementia, HTN, OSA, T2DM, who was admitted on 03/15/2013 with symptoms of weakness.   Generalized weakness - Likely multifactorial  secondary to deconditioning, prior CVA with L-sided weakness, dementia, peripheral neuropathy, and other medical comorbidities. CK only mildly elevated. MRI brain unrevealing of any new or acute abnormalities. Neurology is recommending physical therapy--if PT deems patient appropriate for HH/outpatient PT, he will likely be discharged home later today (6/4).  - PT/OT evaluation and management   Cervical spinal stenosis - pt was noted on MRI cervical spine to have spinal stenosis at multiple levels--most prominent at C3-C4. Pt and family report that his L-sided weakness if residual from prior CVA, thus it is unclear to what extent his cervical spinal stenosis is affecting the patient's mobility/functional status. Regardless, he would benefit from outpatient follow-up with neurosurgery, which will be arranged prior to discharge.  - f/u with neurosurgery as an outpatient   History of CVA - MRI brain unrevealing of any acute stroke.  - cont plavix  - cont lipitor   T2DM - CBGs continue to be well controlled on current lantus dose.  - cont lantus 24U qhs   HTN - BP mildly elevated this AM,  although pt had one BP elevated to 186/76 yesterday.  - increase coreg to 18.75mg  bid - cont norvasc  - cont lasix  - cont lisinopril  - cont spironolactone   Depression - currently not administering home abilify as it is unclear what dose he is prescribed.  DVT PPX - lovenox  CODE STATUS - full CONSULTS PLACED - N/A DISPO - Disposition is deferred at this time, awaiting improvement of weakness.  Anticipated discharge in approximately 1-2 day(s).  The patient does have a current PCP (BROWN, Alycia Rossetti, MD) and does need an [facility] hospital follow-up appointment after discharge.  Is the [facility] hospital follow-up appointment a one-time only appointment? not applicable.  Does the patient have transportation limitations that hinder transportation to clinic appointments? unknown SERVICE NEEDED AT DISCHARGE - TO BE  DETERMINED DURING HOSPITAL COURSE Y = Yes, Blank = No  PT:    OT:    RN:    Equipment:    Other:        Length of Stay: 3 day(s)   Signed: Elfredia Nevins, MD  PGY-1, Internal Medicine Resident Pager: [phone] (7AM-5PM) 03/18/2013, 8:35 AM

## 2013-03-18 NOTE — Discharge Summary (Signed)
Bates Name: [name]  MRN:  [id number]   DOB: 04-06-1941   PCP: Henry Headland, MD         Date of Admission: 03/15/2013  Date of Discharge: 03/18/2013        Attending Physician: Henry Ly, MD      DISCHARGE DIAGNOSES: Generalized weakness Cervical spinal stenosis History of CVA T2DM HTN Depression    DISPOSITION AND FOLLOW-UP: [name] is to follow-up with Henry listed providers as detailed below, at which time, Henry following should be addressed:   1. F/u on symptoms of generalized weakness and assess for improvement in this issue with ongoing physical/occupational therapy.  2. Assess BP and consider escalating anti-HTN regimen if BP remains above goal (150/90).   3. F/u on MRI findings of cervical spinal stenosis and ensure [name] attends his neurosurgery appointment (he should be contacted by Henry [facility] Neurosurgery office with a time/date for Henry appointment).   4. Labs / imaging needed at time of follow-up: N/A  5. Pending labs/ test needing follow-up: N/A    DISCHARGE INSTRUCTIONS: Follow-up Information   Follow up with Henry Shorts, MD. (You will be contacted with an appointment. )    Contact information:   [address] [place] [address] [phone]       Follow up with Henry Harder, MD On 03/25/2013. (3:45PM )    Contact information:   [address] [place] [address] [phone]           Future Appointments Provider Department Dept Phone   03/25/2013 3:45 PM Henry Headland, MD [facility] [phone]   04/16/2013 3:00 PM Henry Riley, MD [facility] [phone]       DISCHARGE MEDICATIONS:   Medication List    TAKE these medications       ABILIFY PO  Take 1 tablet by mouth daily.     amLODipine 10 MG tablet  Commonly known as:  NORVASC  Take 10 mg by mouth daily.     carvedilol 6.25 MG tablet    Commonly known as:  COREG  Take 3 tablets (18.75 mg total) by mouth 2 (two) times daily with a meal.     clopidogrel 75 MG tablet  Commonly known as:  PLAVIX  Take 1 tablet (75 mg total) by mouth daily.     cyclobenzaprine 5 MG tablet  Commonly known as:  FLEXERIL  Take 1 tablet (5 mg total) by mouth 3 (three) times daily as needed.     donepezil 10 MG tablet  Commonly known as:  ARICEPT  Take 1 tablet (10 mg total) by mouth at bedtime.     eplerenone 50 MG tablet  Commonly known as:  INSPRA  Take 1 tablet (50 mg total) by mouth 2 (two) times daily.     furosemide 80 MG tablet  Commonly known as:  LASIX  Take 80 mg by mouth daily.     gabapentin 600 MG tablet  Commonly known as:  NEURONTIN  Take 1 tablet (600 mg total) by mouth 3 (three) times daily.     insulin glargine 100 UNIT/ML injection  Commonly known as:  LANTUS  Inject 0.24 mLs (24 Units total) into Henry skin at bedtime.     lisinopril 20 MG tablet  Commonly known as:  PRINIVIL,ZESTRIL  Take 20 mg by mouth daily.  simvastatin 40 MG tablet  Commonly known as:  ZOCOR  Take 40 mg by mouth at bedtime.     terazosin 5 MG capsule  Commonly known as:  HYTRIN  Take 1 capsule (5 mg total) by mouth at bedtime.         CONSULTS:  Neurology   PROCEDURES PERFORMED:  Dg Chest 2 View  03/15/2013   *RADIOLOGY REPORT*  Clinical Data: Weakness  CHEST - 2 VIEW  Comparison: 05/04/2012  Findings: Linear left lower lobe atelectasis or scarring noted. Lung volumes are low with crowding of Henry bronchovascular markings, but slightly improved since Henry prior exam.  Cardiac leads obscure detail.  Heart size upper limits of normal.  No pleural effusion.  IMPRESSION: Low volume exam with crowding of Henry bronchovascular markings but no acute finding allowing for this. If [name]'s symptoms continue, consider PA and lateral chest radiographs obtained at full inspiration when [name] is clinically able.   Original Report  Authenticated By: Christiana Pellant, M.D.   Ct Head Wo Contrast  03/15/2013   *RADIOLOGY REPORT*  Clinical Data: Acute onset of weakness; slumped into kneeling position.  CT HEAD WITHOUT CONTRAST  Technique:  Contiguous axial images were obtained from Henry base of Henry skull through Henry vertex without contrast.  Comparison: CT of Henry head performed 08/06/2012, and MRI of Henry brain performed 05/05/2012  Findings: There is no evidence of acute infarction, mass lesion, or intra- or extra-axial hemorrhage on CT.  Prominence of Henry ventricles and sulci reflects mild cortical volume loss.  Cerebellar atrophy is noted.  Diffuse periventricular and subcortical white matter change likely reflects small vessel ischemic microangiopathy.  Chronic ischemic change is noted at Henry right basal ganglia and left anterior corona radiata. Postoperative change is seen at Henry right MCA circulation.  Henry brainstem and fourth ventricle are within normal limits.  Henry cerebral hemispheres demonstrate grossly normal gray-white differentiation.  No mass effect or midline shift is seen.  There is no evidence of fracture; a right frontoparietal craniotomy flap is noted, with overlying soft tissue scarring.  Henry visualized portions of Henry orbits are within normal limits.  Henry paranasal sinuses and mastoid air cells are well-aerated.  No significant soft tissue abnormalities are seen.  IMPRESSION:  1.  No acute intracranial pathology seen on CT. 2.  Mild cortical volume loss and diffuse small vessel ischemic microangiopathy. 3.  Chronic ischemic change at Henry right basal ganglia and left anterior corona radiata. 4.  Postoperative changes noted, with right frontoparietal craniotomy flap.   Original Report Authenticated By: Tonia Ghent, M.D.   Mr Brain Wo Contrast  03/17/2013   *RADIOLOGY REPORT*  Clinical Data:  Increasing inability to walk.  Declining memory. Previous strokes. Diabetic hypertensive hyperlipidemic Bates. History of right middle  cerebral artery bifurcation aneurysm.  MRI HEAD WITHOUT CONTRAST MRI CERVICAL SPINE WITHOUT CONTRAST  Technique:  Multiplanar, multiecho pulse sequences of Henry brain and surrounding structures, and cervical spine, to include Henry craniocervical junction and cervicothoracic junction, were obtained without intravenous contrast.  Comparison:  03/15/2013 CT.  05/05/2012 MR.  04/23/2008 catheter angiogram.  No comparison cervical spine MR.  MRI HEAD  Findings:  No acute infarct.  Prior right frontal craniotomy for clipping of right middle cerebral artery bifurcation aneurysm.  Artifact at this level.  No intracranial hemorrhage.  Prior basal ganglia, caudate and thalamic infarcts.  Moderate white matter type changes consistent with result of small vessel disease. Altered signal intensity within Henry medulla probably related to  prior infarcts/small vessel disease type changes.  Global atrophy without hydrocephalus.  No intracranial mass lesion detected on this unenhanced exam.  Cervical medullary junction, pituitary pineal region unremarkable. Mild exophthalmos.  Major intracranial vascular structures are patent.  Limited for detection of aneurysm.  Minimal mucosal thickening right maxillary sinus.  IMPRESSION: No acute infarct.  Prior right frontal craniotomy for clipping of right middle cerebral artery bifurcation aneurysm.  Artifact at this level.  No intracranial hemorrhage.  Prior basal ganglia, caudate and thalamic infarcts.  Moderate white matter type changes consistent with result of small vessel disease.  Global atrophy without hydrocephalus.  MRI CERVICAL SPINE  Findings: Motion degraded exam.  Cervical medullary junction unremarkable.  No definitive focal cervical cord signal abnormality.  Visualized paravertebral structures unremarkable.  Both vertebral arteries are patent.  C2-3:  Minimal bulge greater to Henry left.  C3-4:  Moderate broad-based disc osteophyte complex.  Buckling posterior ligaments.  Moderate  spinal stenosis with cord flattening.  Within Henry compressed cord, no definitive evidence of gliosis and / or edema.  Marked bilateral foraminal narrowing.  C4-5:  Bulge/broad-based protrusion minimally more notable to Henry right.  Spinal stenosis and mild cord flattening greater on Henry right.  Uncinate bony overgrowth.  Moderate bilateral foraminal narrowing greater on Henry right.  C5-6:  Broad-based disc osteophyte complex greater to Henry right. Mild spinal stenosis greater on Henry right.  Minimal cord contact on Henry right.  Uncinate hypertrophy.  Moderate bilateral foraminal narrowing.  C6-7:  Minimal bulge.  Uncinate hypertrophy with mild right-sided and mild to moderate left-sided foraminal narrowing.  C7-T1:  Minimal bulge.  Minimal uncinate hypertrophy.  Minimal foraminal narrowing greater on Henry right.  T1-2:  Small Schmorl's node deformities superior plate T2.  IMPRESSION: Motion degraded exam.  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   Original Report Authenticated By: Lacy Duverney, M.D.   Mr Cervical Spine Wo Contrast  03/17/2013   *RADIOLOGY REPORT*  Clinical Data:  Increasing inability to walk.  Declining memory. Previous strokes. Diabetic hypertensive hyperlipidemic Bates. History of right middle cerebral artery bifurcation aneurysm.  MRI HEAD WITHOUT CONTRAST MRI CERVICAL SPINE WITHOUT CONTRAST  Technique:  Multiplanar, multiecho pulse sequences of Henry brain and surrounding structures, and cervical spine, to include Henry craniocervical junction and cervicothoracic junction, were obtained without intravenous contrast.  Comparison:  03/15/2013 CT.  05/05/2012 MR.  04/23/2008 catheter angiogram.  No comparison cervical spine MR.  MRI HEAD  Findings:  No acute infarct.  Prior right frontal craniotomy for clipping of right middle cerebral artery bifurcation aneurysm.  Artifact at this  level.  No intracranial hemorrhage.  Prior basal ganglia, caudate and thalamic infarcts.  Moderate white matter type changes consistent with result of small vessel disease. Altered signal intensity within Henry medulla probably related to prior infarcts/small vessel disease type changes.  Global atrophy without hydrocephalus.  No intracranial mass lesion detected on this unenhanced exam.  Cervical medullary junction, pituitary pineal region unremarkable. Mild exophthalmos.  Major intracranial vascular structures are patent.  Limited for detection of aneurysm.  Minimal mucosal thickening right maxillary sinus.  IMPRESSION: No acute infarct.  Prior right frontal craniotomy for clipping of right middle cerebral artery bifurcation aneurysm.  Artifact at this level.  No intracranial hemorrhage.  Prior basal ganglia, caudate and thalamic infarcts.  Moderate white matter type changes consistent with result of small vessel disease.  Global atrophy without hydrocephalus.  MRI CERVICAL SPINE  Findings: Motion degraded exam.  Cervical medullary junction unremarkable.  No definitive focal cervical cord signal abnormality.  Visualized paravertebral structures unremarkable.  Both vertebral arteries are patent.  C2-3:  Minimal bulge greater to Henry left.  C3-4:  Moderate broad-based disc osteophyte complex.  Buckling posterior ligaments.  Moderate spinal stenosis with cord flattening.  Within Henry compressed cord, no definitive evidence of gliosis and / or edema.  Marked bilateral foraminal narrowing.  C4-5:  Bulge/broad-based protrusion minimally more notable to Henry right.  Spinal stenosis and mild cord flattening greater on Henry right.  Uncinate bony overgrowth.  Moderate bilateral foraminal narrowing greater on Henry right.  C5-6:  Broad-based disc osteophyte complex greater to Henry right. Mild spinal stenosis greater on Henry right.  Minimal cord contact on Henry right.  Uncinate hypertrophy.  Moderate bilateral foraminal narrowing.   C6-7:  Minimal bulge.  Uncinate hypertrophy with mild right-sided and mild to moderate left-sided foraminal narrowing.  C7-T1:  Minimal bulge.  Minimal uncinate hypertrophy.  Minimal foraminal narrowing greater on Henry right.  T1-2:  Small Schmorl's node deformities superior plate T2.  IMPRESSION: Motion degraded exam.  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   Original Report Authenticated By: Lacy Duverney, M.D.       ADMISSION DATA: H&P: Bates is a 72 y.o. male with a PMHx of diabetes, hypertension, dementia, obstructive sleep apnea, CVA, with left-sided weakness who presents to [facility] for evaluation of cause of his falls and management.  According to [name]'s wife [name] was well until about 5:30 PM on Henry day of admission when he fell on Henry back porch of his house. [name] was reportedly taking close to Henry laundry room and on his way back to Henry house, he knees become shaky before his legs gave way and he fell to Henry ground slowly. [name] reports that Henry impact was minimal as he tried to hold himself down during Henry fall. He pressed his life alert, and EMS arrived to bring him to Henry ED. He estimates that spent about 30 minutes on Henry floor. No history of hitting his head. He denies any symptoms of dizziness, chest pain, diaphoresis, loss of consciousness, or palpitations prior or during Henry episode. He remained fully conscious. While lying on Henry floor, but he was feeling too weak to get up by himself. According to his wife, [name] has been declining gradually over Henry past year requiring Henry use of a cane and more recently a wheelchair on and off for Henry last 1 year. He has lost 2 of his brothers in Henry last 3 months. Following family tragedy [name] has declined even more. She has residual left arm weakness due to a stroke in 2009.  No history of fevers or  chills. No vomiting or nausea. Bates had had breakfast and lunch. He thinks that he had adequate fluid intake before Henry fall.   Physical Exam: General:  Vital signs reviewed and noted. Well-developed, well-nourished, in no acute distress; alert, appropriate and cooperative throughout examination. Appears fatigued.   Head:  Normocephalic, atraumatic.   Eyes:  PERRL, EOMI, No signs of anemia or jaundince.   Nose:  Mucous membranes moist, not inflammed, nonerythematous.   Throat:  Oropharynx nonerythematous, no exudate appreciated.   Neck:  No deformities, masses, or tenderness noted. Supple, No carotid Bruits, no JVD.   Lungs:  Normal respiratory effort. Clear to auscultation BL without crackles or wheezes.   Heart:  RRR. S1 and S2 normal without gallop, murmur, or rubs.   Abdomen:  BS normoactive. Soft, Nondistended, non-tender. No masses or organomegaly.   Extremities:  No pretibial edema.   Neurologic:  A&O X3, CN II - XII are grossly intact. Motor strength is 5/5 in Henry all 4 extremities, Sensations intact to light touch, Cerebellar signs negative.  Abnormal gait with unsteadiness of his feet   Skin:  No visible rashes, scars.     Labs: CURRENT LABS:  CBC:    Component  Value  Date/Time    WBC  6.8  03/15/2013 2002    HGB  12.2*  03/15/2013 2002    HCT  38.7*  03/15/2013 2002    PLT  187  03/15/2013 2002    MCV  82.0  03/15/2013 2002    NEUTROABS  4.9  03/15/2013 2002    LYMPHSABS  1.4  03/15/2013 2002    MONOABS  0.4  03/15/2013 2002    EOSABS  0.1  03/15/2013 2002    BASOSABS  0.0  03/15/2013 2002    Metabolic Panel:    Component  Value  Date/Time    NA  142  03/15/2013 2045    K  4.3  03/15/2013 2045    CL  105  03/15/2013 2045    CO2  27  03/15/2013 2045    BUN  18  03/15/2013 2045    CREATININE  1.16  03/15/2013 2045    CREATININE  1.52*  06/25/2012 1618    GLUCOSE  172*  03/15/2013 2045    CALCIUM  9.9  03/15/2013 2045    AST  21  03/15/2013 2045    ALT  19  03/15/2013 2045    ALKPHOS  67   03/15/2013 2045    BILITOT  0.6  03/15/2013 2045    PROT  7.3  03/15/2013 2045    ALBUMIN  3.9  03/15/2013 2045    Urinalysis:   Recent Labs   03/15/13 2153   COLORURINE  YELLOW   LABSPEC  1.024   PHURINE  5.5   GLUCOSEU  100*   HGBUR  TRACE*   BILIRUBINUR  NEGATIVE   KETONESUR  15*   PROTEINUR  >300*   UROBILINOGEN  1.0   NITRITE  NEGATIVE   LEUKOCYTESUR  NEGATIVE    Drugs of Abuse    Component  Value  Date/Time    LABOPIA  NONE DETECTED  08/06/2012 1900    LABOPIA  NEGATIVE  03/11/2009 0532    COCAINSCRNUR  NONE DETECTED  08/06/2012 1900    COCAINSCRNUR  NEGATIVE  03/11/2009 0532    LABBENZ  NONE DETECTED  08/06/2012 1900    LABBENZ  NEGATIVE  03/11/2009 0532    AMPHETMU  NONE DETECTED  08/06/2012 1900    AMPHETMU  NEGATIVE  03/11/2009 0532    THCU  NONE DETECTED  08/06/2012 1900    LABBARB  NONE DETECTED  08/06/2012 1900     Recent Labs  Lab  03/16/13 0023   PROBNP  130.7*    HISTORICAL LABS:  Lab Results   Component  Value  Date    HGBA1C  7.6  02/11/2013    Lab Results   Component  Value  Date    CHOL  170  03/05/2012    HDL  36*  03/05/2012    LDLCALC  109*  03/05/2012  TRIG  126  03/05/2012    CHOLHDL  4.7  03/05/2012    Lab Results   Component  Value  Date    TSH  1.305  05/05/2012      HOSPITAL COURSE: Generalized weakness - Likely multifactorial secondary to deconditioning, prior CVA with L-sided weakness, dementia, peripheral neuropathy, and other medical comorbidities. CK only mildly elevated. MRI brain unrevealing of any new or acute abnormalities. Bates was evaluated by [name] who recommended discharge to CIR. CIR was consulted and [name] was deemed inappropriate for inpt rehab--he was therefore discharged to SNF for rehabilitation.   Cervical spinal stenosis - [name] was noted on MRI cervical spine to have spinal stenosis at multiple levels--most prominent at C3-C4. [name] and family report that his L-sided weakness if residual from prior CVA, thus it is unclear to  what extent his cervical spinal stenosis is affecting [name]'s mobility/functional status as it does not seem to be responsible for his weakness. Regardless, he will benefit from outpatient follow-up with neurosurgery, and [facility] should be contacting him with an appointment date/time (previously seen by Dr. Newell Coral).  History of CVA - MRI brain unrevealing of any acute stroke. Plavix and lipitor were continued throughout [name]'s hospital stay.   T2DM - CBGs initially elevated on admission but otherwise well-controlled on home lantus dose throughout Henry remainder of his hospital course.   HTN - BP mild/moderately elevated during hospital course. Coreg was increased to 18.75mg  bid. Norvasc, lasix, lisinopril, and spironolactone were continued at home doses.   Depression - [name]/wife reported a history of depression and [name]'s wife states he follows with Dr. Donell Beers, however on contacting Dr. Caprice Renshaw office there were no records available for this Bates. [name]/wife were unsure as to his home abilify dose--he was instructed to resume his home dose at discharge.    DISCHARGE DATA: Vital Signs: BP 156/79  Pulse 79  Temp(Src) 99 F (37.2 C) (Oral)  Resp 18  Ht 5\' 7"  (1.702 m)  Wt 200 lb 12.8 oz (91.082 kg)  BMI 31.44 kg/m2  SpO2 95%  Labs: Results for orders placed during Henry hospital encounter of 03/15/13 (from Henry past 24 hour(s))  GLUCOSE, CAPILLARY     Status: Abnormal   Collection Time    03/17/13  4:56 PM      Result Value Range   Glucose-Capillary 279 (*) 70 - 99 mg/dL  GLUCOSE, CAPILLARY     Status: Abnormal   Collection Time    03/17/13 10:23 PM      Result Value Range   Glucose-Capillary 196 (*) 70 - 99 mg/dL  GLUCOSE, CAPILLARY     Status: Abnormal   Collection Time    03/18/13  7:22 AM      Result Value Range   Glucose-Capillary 128 (*) 70 - 99 mg/dL  GLUCOSE, CAPILLARY     Status: Abnormal   Collection Time    03/18/13 11:30 AM      Result Value  Range   Glucose-Capillary 185 (*) 70 - 99 mg/dL     Time spent on discharge: 35 minutes  Services Ordered on Discharge: Y = Yes; Blank = No [name]:   OT:   RN:   Equipment:   Other:     Signed: Elenor Legato PGY 1, Internal Medicine Resident 03/18/2013, 1:59 PM

## 2013-03-18 NOTE — Consult Note (Signed)
Physical Medicine and Rehabilitation Consult Reason for Consult: Multifactorial generalized weakness Referring Physician: Teaching service   HPI: [name] is a 72 y.o. right-handed male with history of large right middle cerebral artery aneurysm, status post craniotomy and clipping August of 2009 with resultant left hemiparesthesias and received inpatient rehabilitation services 06/03/2008 - 06/12/2008. Also with history of diabetes mellitus and peripheral neuropathy, systolic congestive heart failure. Admitted 03/16/2013 with increasing falls as well as generalized weakness and chronic gait disorder. MRI of the brain showed no acute infarct or intracranial hemorrhage. Noted prior basal ganglia, caudate and thalamic infarcts. MRI C-spine without fracture. There was noted spondylitic changes with various degrees of spinal stenosis and foraminal narrowing prominent at C3-4 levels. Followup neurology services progressive weakness felt to BE multi-factorial in nature including previous strokes as well as peripheral neuropathy and possible dementia. Patient currently maintained on Plavix for history of CVA as well as the addition of subcutaneous Lovenox for DVT prophylaxis. Physical therapy evaluation completed 03/18/2013 with recommendations of physical medicine rehabilitation consult to consider inpatient rehabilitation services.  Patient has no complaints. States that his left side was weak from previous stroke Review of Systems  Cardiovascular: Positive for leg swelling.  Musculoskeletal: Positive for myalgias and falls.  Neurological: Positive for weakness.  Psychiatric/Behavioral: Positive for depression and memory loss.  All other systems reviewed and are negative.   Past Medical History  Diagnosis Date  . Stroke     left thalamic infarct  . Diabetes mellitus   . Hypertension   . Cerebral aneurysm without rupture 05/2008    Right MCA aneurysm, status post craniotomy and clipping,  with resultant L face, arm, and leg numbness  . TIA (transient ischemic attack)     multiple in past, most recently in 2011  . Sleep apnea   . CHF (congestive heart failure)   . Depression   . Hyperlipemia   . Arthritis    Past Surgical History  Procedure Laterality Date  . Knee arthroscopy      lt knee  . Shoulder arthroscopy w/ rotator cuff repair      rt shoulder  . Intracranial aneurysm repair    . Neck artery repaired from injury     Family History  Problem Relation Age of Onset  . Colon cancer Neg Hx   . Diabetes Sister   . Diabetes Brother   . Diabetes Paternal Aunt    Social History:  reports that he has quit smoking. He has never used smokeless tobacco. He reports that he does not drink alcohol or use illicit drugs. Allergies:  Allergies  Allergen Reactions  . Pioglitazone Other (See Comments)    Edema   . Rosiglitazone Maleate Other (See Comments)    edema   Medications Prior to Admission  Medication Sig Dispense Refill  . amLODipine (NORVASC) 10 MG tablet Take 10 mg by mouth daily.      . ARIPiprazole (ABILIFY PO) Take 1 tablet by mouth daily.      . carvedilol (COREG) 25 MG tablet Take 12.5 mg by mouth 2 (two) times daily with a meal.      . clopidogrel (PLAVIX) 75 MG tablet Take 1 tablet (75 mg total) by mouth daily.  30 tablet  11  . cyclobenzaprine (FLEXERIL) 5 MG tablet Take 1 tablet (5 mg total) by mouth 3 (three) times daily as needed.  90 tablet  6  . donepezil (ARICEPT) 10 MG tablet Take 1 tablet (10 mg total) by mouth at  bedtime.  30 tablet  5  . eplerenone (INSPRA) 50 MG tablet Take 1 tablet (50 mg total) by mouth 2 (two) times daily.  60 tablet  3  . furosemide (LASIX) 80 MG tablet Take 80 mg by mouth daily.      Marland Kitchen gabapentin (NEURONTIN) 600 MG tablet Take 1 tablet (600 mg total) by mouth 3 (three) times daily.  90 tablet  5  . insulin glargine (LANTUS) 100 UNIT/ML injection Inject 0.24 mLs (24 Units total) into the skin at bedtime.  15 mL  12  .  lisinopril (PRINIVIL,ZESTRIL) 20 MG tablet Take 20 mg by mouth daily.      . simvastatin (ZOCOR) 40 MG tablet Take 40 mg by mouth at bedtime.      Marland Kitchen terazosin (HYTRIN) 5 MG capsule Take 1 capsule (5 mg total) by mouth at bedtime.  90 capsule  3    Home: Home Living Lives With: Spouse Available Help at Discharge: Family;Available 24 hours/day Type of Home: House Home Access: Stairs to enter [employer] of Steps: 3 Entrance Stairs-Rails: Right Home Layout: One level Bathroom Shower/Tub: Forensic scientist: Standard Home Adaptive Equipment: Shower chair with back;Straight cane;Walker - rolling;Wheelchair - manual  Functional History: Prior Function Able to Take Stairs?: Yes Driving: Yes Vocation: Retired Functional Status:  Mobility: Bed Mobility Bed Mobility: Rolling Right;Right Sidelying to Sit;Sitting - Scoot to Edge of Bed Rolling Right: 4: Min guard Right Sidelying to Sit: 4: Min guard Sitting - Scoot to [place] of Bed: 4: Min assist Transfers Transfers: Sit to Stand;Stand to Sit Sit to Stand: 4: Min assist;From elevated surface;From bed Stand to Sit: 4: Min assist;To chair/3-in-1 Ambulation/Gait Ambulation/Gait Assistance: 4: Min guard;4: Min Environmental consultant (Feet): 16 Feet Assistive device: Rolling walker Ambulation/Gait Assistance Details: Patient reuires some manual assist with RW and verbal cues for proper use, assist for stability and attention to left. Gait Pattern: Step-to pattern;Decreased stride length;Decreased weight shift to left;Shuffle;Trunk flexed;Narrow base of support Gait velocity: decreased General Gait Details: instability with gait Stairs: No    ADL:    Cognition: Cognition Overall Cognitive Status: No family/caregiver present to determine baseline cognitive functioning Arousal/Alertness: Awake/alert Orientation Level: Oriented to person;Oriented to place;Oriented to situation;Disoriented to  time Cognition Arousal/Alertness: Awake/alert Behavior During Therapy: Flat affect Overall Cognitive Status: No family/caregiver present to determine baseline cognitive functioning Area of Impairment: Attention;Following commands;Awareness;Problem solving Current Attention Level: Sustained Following Commands: Follows multi-step commands inconsistently Awareness: Intellectual Problem Solving: Slow processing;Decreased initiation;Requires verbal cues;Requires tactile cues General Comments: Pt demonstrates some inattention to Left side, decreased attention to the left side  Blood pressure 156/79, pulse 79, temperature 99 F (37.2 C), temperature source Oral, resp. rate 18, height 5\' 7"  (1.702 m), weight 91.082 kg (200 lb 12.8 oz), SpO2 95.00%. Physical Exam  Vitals reviewed. Eyes: EOM are normal.  Neck: Normal range of motion. Neck supple. No thyromegaly present.  Cardiovascular: Normal rate and regular rhythm.   Pulmonary/Chest: Effort normal and breath sounds normal. No respiratory distress.  Abdominal: Soft. Bowel sounds are normal. He exhibits no distension.  Neurological: He is alert.  Mood is flat but appropriate. Patient was able to state his age date of birth as well as name of hospital. He followed basic commands  Skin: Skin is warm and dry.   motor strength 3 minus/5 in the left deltoid, biceps, triceps, grip 3 minus on the left hip flexor 4 minus knee extensor 3 minus ankle dorsiflex plantar flexor 4/5 in the same  muscle groups on the right side Sensation intact to light touch in both upper and lower limbs Finger nose to finger testing shows no evidence of ataxia on the right side on the left side unable to perform secondary contracture and weakness Mild left MCP contracture and wrist contracture  Results for orders placed during the hospital encounter of 03/15/13 (from the past 24 hour(s))  GLUCOSE, CAPILLARY     Status: Abnormal   Collection Time    03/17/13 11:26 AM       Result Value Range   Glucose-Capillary 166 (*) 70 - 99 mg/dL  GLUCOSE, CAPILLARY     Status: Abnormal   Collection Time    03/17/13  4:56 PM      Result Value Range   Glucose-Capillary 279 (*) 70 - 99 mg/dL  GLUCOSE, CAPILLARY     Status: Abnormal   Collection Time    03/17/13 10:23 PM      Result Value Range   Glucose-Capillary 196 (*) 70 - 99 mg/dL  GLUCOSE, CAPILLARY     Status: Abnormal   Collection Time    03/18/13  7:22 AM      Result Value Range   Glucose-Capillary 128 (*) 70 - 99 mg/dL   Mr Brain Wo Contrast  03/17/2013   *RADIOLOGY REPORT*  Clinical Data:  Increasing inability to walk.  Declining memory. Previous strokes. Diabetic hypertensive hyperlipidemic patient. History of right middle cerebral artery bifurcation aneurysm.  MRI HEAD WITHOUT CONTRAST MRI CERVICAL SPINE WITHOUT CONTRAST  Technique:  Multiplanar, multiecho pulse sequences of the brain and surrounding structures, and cervical spine, to include the craniocervical junction and cervicothoracic junction, were obtained without intravenous contrast.  Comparison:  03/15/2013 CT.  05/05/2012 MR.  04/23/2008 catheter angiogram.  No comparison cervical spine MR.  MRI HEAD  Findings:  No acute infarct.  Prior right frontal craniotomy for clipping of right middle cerebral artery bifurcation aneurysm.  Artifact at this level.  No intracranial hemorrhage.  Prior basal ganglia, caudate and thalamic infarcts.  Moderate white matter type changes consistent with result of small vessel disease. Altered signal intensity within the medulla probably related to prior infarcts/small vessel disease type changes.  Global atrophy without hydrocephalus.  No intracranial mass lesion detected on this unenhanced exam.  Cervical medullary junction, pituitary pineal region unremarkable. Mild exophthalmos.  Major intracranial vascular structures are patent.  Limited for detection of aneurysm.  Minimal mucosal thickening right maxillary sinus.  IMPRESSION:  No acute infarct.  Prior right frontal craniotomy for clipping of right middle cerebral artery bifurcation aneurysm.  Artifact at this level.  No intracranial hemorrhage.  Prior basal ganglia, caudate and thalamic infarcts.  Moderate white matter type changes consistent with result of small vessel disease.  Global atrophy without hydrocephalus.  MRI CERVICAL SPINE  Findings: Motion degraded exam.  Cervical medullary junction unremarkable.  No definitive focal cervical cord signal abnormality.  Visualized paravertebral structures unremarkable.  Both vertebral arteries are patent.  C2-3:  Minimal bulge greater to the left.  C3-4:  Moderate broad-based disc osteophyte complex.  Buckling posterior ligaments.  Moderate spinal stenosis with cord flattening.  Within the compressed cord, no definitive evidence of gliosis and / or edema.  Marked bilateral foraminal narrowing.  C4-5:  Bulge/broad-based protrusion minimally more notable to the right.  Spinal stenosis and mild cord flattening greater on the right.  Uncinate bony overgrowth.  Moderate bilateral foraminal narrowing greater on the right.  C5-6:  Broad-based disc osteophyte complex greater to the right.  Mild spinal stenosis greater on the right.  Minimal cord contact on the right.  Uncinate hypertrophy.  Moderate bilateral foraminal narrowing.  C6-7:  Minimal bulge.  Uncinate hypertrophy with mild right-sided and mild to moderate left-sided foraminal narrowing.  C7-T1:  Minimal bulge.  Minimal uncinate hypertrophy.  Minimal foraminal narrowing greater on the right.  T1-2:  Small Schmorl's node deformities superior plate T2.  IMPRESSION: Motion degraded exam.  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   Original Report Authenticated By: Lacy Duverney, M.D.   Mr Cervical Spine Wo Contrast  03/17/2013   *RADIOLOGY REPORT*  Clinical Data:  Increasing  inability to walk.  Declining memory. Previous strokes. Diabetic hypertensive hyperlipidemic patient. History of right middle cerebral artery bifurcation aneurysm.  MRI HEAD WITHOUT CONTRAST MRI CERVICAL SPINE WITHOUT CONTRAST  Technique:  Multiplanar, multiecho pulse sequences of the brain and surrounding structures, and cervical spine, to include the craniocervical junction and cervicothoracic junction, were obtained without intravenous contrast.  Comparison:  03/15/2013 CT.  05/05/2012 MR.  04/23/2008 catheter angiogram.  No comparison cervical spine MR.  MRI HEAD  Findings:  No acute infarct.  Prior right frontal craniotomy for clipping of right middle cerebral artery bifurcation aneurysm.  Artifact at this level.  No intracranial hemorrhage.  Prior basal ganglia, caudate and thalamic infarcts.  Moderate white matter type changes consistent with result of small vessel disease. Altered signal intensity within the medulla probably related to prior infarcts/small vessel disease type changes.  Global atrophy without hydrocephalus.  No intracranial mass lesion detected on this unenhanced exam.  Cervical medullary junction, pituitary pineal region unremarkable. Mild exophthalmos.  Major intracranial vascular structures are patent.  Limited for detection of aneurysm.  Minimal mucosal thickening right maxillary sinus.  IMPRESSION: No acute infarct.  Prior right frontal craniotomy for clipping of right middle cerebral artery bifurcation aneurysm.  Artifact at this level.  No intracranial hemorrhage.  Prior basal ganglia, caudate and thalamic infarcts.  Moderate white matter type changes consistent with result of small vessel disease.  Global atrophy without hydrocephalus.  MRI CERVICAL SPINE  Findings: Motion degraded exam.  Cervical medullary junction unremarkable.  No definitive focal cervical cord signal abnormality.  Visualized paravertebral structures unremarkable.  Both vertebral arteries are patent.  C2-3:   Minimal bulge greater to the left.  C3-4:  Moderate broad-based disc osteophyte complex.  Buckling posterior ligaments.  Moderate spinal stenosis with cord flattening.  Within the compressed cord, no definitive evidence of gliosis and / or edema.  Marked bilateral foraminal narrowing.  C4-5:  Bulge/broad-based protrusion minimally more notable to the right.  Spinal stenosis and mild cord flattening greater on the right.  Uncinate bony overgrowth.  Moderate bilateral foraminal narrowing greater on the right.  C5-6:  Broad-based disc osteophyte complex greater to the right. Mild spinal stenosis greater on the right.  Minimal cord contact on the right.  Uncinate hypertrophy.  Moderate bilateral foraminal narrowing.  C6-7:  Minimal bulge.  Uncinate hypertrophy with mild right-sided and mild to moderate left-sided foraminal narrowing.  C7-T1:  Minimal bulge.  Minimal uncinate hypertrophy.  Minimal foraminal narrowing greater on the right.  T1-2:  Small Schmorl's node deformities superior plate T2.  IMPRESSION: Motion degraded exam.  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   Original Report Authenticated By: Lacy Duverney, M.D.  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 Does the need for close, 24 hr/day medical supervision in concert with the patient's rehab needs make it unreasonable for this patient to be served in a less intensive setting? No 2. Co-Morbidities requiring supervision/potential complications: Diabetes, hypertension 3. Due to Not applicable, does the patient require 24 hr/day rehab nursing? No 4. Does the patient require coordinated care of a physician, rehab nurse, Not applicable to address physical and functional deficits in the context of the above medical diagnosis(es)?  No Addressing deficits in the following areas: Not applicable 5. Can the patient actively participate in an intensive therapy program of at least 3 hrs of therapy per day at least 5 days per week? Potentially 6. The potential for patient to make measurable gains while on inpatient rehab is poor 7. Anticipated functional outcomes upon discharge from inpatient rehab are min guard assist mobility with PT, min min guard ADLs with OT, not applicable with SLP. 8. Estimated rehab length of stay to reach the above functional goals is: Not applicable 9. Does the patient have adequate social supports to accommodate these discharge functional goals? Potentially 10. Anticipated D/C setting: Home versus SNF 11. Anticipated post D/C treatments: HH therapy 12. Overall Rehab/Functional Prognosis: poor  RECOMMENDATIONS: This patient's condition is appropriate for continued rehabilitative care in the following setting: SNF at family's unable to manage with 24-hour care needs, otherwise home with home health at supervision to minimal guard assist level Patient has agreed to participate in recommended program. Potentially Note that insurance prior authorization may be required for reimbursement for recommended care.  Comment:    03/18/2013

## 2013-03-18 NOTE — Progress Notes (Signed)
Rehab Admissions Coordinator Note:  Patient was screened by [name] for appropriateness for an Inpatient Acute Rehab Consult.  At this time, we are recommending Inpatient Rehab consult. I will contact MD to obtain.  [name] 03/18/2013, 10:18 AM  I can be reached at [phone].

## 2013-03-18 NOTE — Progress Notes (Signed)
Internal Medicine Attending  Date: 03/18/2013  Patient name: [name] Medical record number: [id number] Date of birth: May 31, 1941 Age: 72 y.o. Gender: male  I saw and evaluated the patient and discussed his care on a.m. rounds with house staff. I reviewed the resident's note by Dr. Lavena Bullion and I agree with the resident's findings and plans as documented in his note with the following additional comments.  Patient has no new complaints today.  He was seen by physical therapy and they feel that he needs inpatient rehabilitation and is currently not appropriate for discharge home.  We talked with the patient about this, and the plan is to consult our inpatient rehabilitation service.

## 2013-03-18 NOTE — Progress Notes (Signed)
Rehab consult is complete and pt is not a candidate for Inpt rehab admission at this time. I have alerted RN CM of decision to assist in facilitating d/c to SNF today. [phone]

## 2013-03-19 NOTE — Progress Notes (Signed)
OT NOTE (G - CODE) late entry   March 29, 2013 0900  OT G-codes **NOT FOR INPATIENT CLASS**  Functional Assessment Tool Used Clinical observation  Functional Limitation Self care  Self Care Current Status (Z6109) CJ  Self Care Goal Status (U0454) Bearl Mulberry   OTR/L Pager: [phone] Office: [phone] . Entry for [employer]

## 2013-03-25 ENCOUNTER — Ambulatory Visit (INDEPENDENT_AMBULATORY_CARE_PROVIDER_SITE_OTHER): Payer: Medicare Other | Admitting: Internal Medicine

## 2013-03-25 ENCOUNTER — Encounter: Payer: Self-pay | Admitting: Internal Medicine

## 2013-03-25 VITALS — BP 132/72 | HR 64 | Temp 98.2°F | Ht 67.0 in | Wt 211.0 lb

## 2013-03-25 DIAGNOSIS — R5381 Other malaise: Secondary | ICD-10-CM

## 2013-03-25 DIAGNOSIS — R531 Weakness: Secondary | ICD-10-CM

## 2013-03-25 DIAGNOSIS — I10 Essential (primary) hypertension: Secondary | ICD-10-CM

## 2013-03-25 DIAGNOSIS — E119 Type 2 diabetes mellitus without complications: Secondary | ICD-10-CM

## 2013-03-25 NOTE — Assessment & Plan Note (Signed)
BP Readings from Last 3 Encounters:  03/25/13 132/72  03/18/13 156/79  02/11/13 117/55    Lab Results  Component Value Date   NA 142 03/15/2013   K 4.3 03/15/2013   CREATININE 1.16 03/15/2013    Assessment: Blood pressure control: controlled Progress toward BP goal:  at goal Comments: Initial BP was mildly elevated, but recheck shows good BP control.  Plan: Medications:  continue current medications Educational resources provided:   Self management tools provided:   Other plans: Recheck at next visit

## 2013-03-25 NOTE — Patient Instructions (Addendum)
General Instructions: For your diabetes, we will continue your Lantus at its current dose of 24 units once per day -we are checking a cholesterol panel today -you are due to your yearly diabetic eye exam  Your weakness appears to be improving with physical therapy!  It will be important to continue these physical therapy exercises after you leave rehab.  Please return for a follow-up visit in 3 months.  Treatment Goals:  Goals (1 Years of Data) as of 03/25/13         As of Today 03/18/13 03/18/13 03/17/13 03/17/13     Blood Pressure    . Blood Pressure < 140/90  146/67 156/79 154/69 186/76 154/90     Lifestyle    . Prevent Falls            Progress Toward Treatment Goals:  Treatment Goal 03/25/2013  Hemoglobin A1C at goal  Blood pressure at goal  Prevent falls improved    Self Care Goals & Plans:  Self Care Goal 03/25/2013  Manage my medications take my medicines as prescribed; refill my medications on time  Monitor my health keep track of my blood glucose; bring my glucose meter and log to each visit  Eat healthy foods drink diet soda or water instead of juice or soda; eat foods that are low in salt; eat baked foods instead of fried foods  Be physically active (No Data)    Home Blood Glucose Monitoring 03/25/2013  Check my blood sugar 2 times a day  When to check my blood sugar before meals     Care Management & Community Referrals:  Referral 03/25/2013  Referrals made for care management support none needed

## 2013-03-25 NOTE — Progress Notes (Signed)
HPI The patient is a 72 y.o. male with a history of DM, HTN, OSA, prior CVA, presenting for a hospital follow-up.  The patient presents for a hospital follow-up from hospitalization 6/1-6/4, for a fall with generalized weakness, thought to be due to a combination of deconditioning, deficits from prior CVA, dementia, and neuropathy.  Since then, he has been participating with PT at his SNF, and notes that he feels that his strength is starting to improve.  The patient has an upcoming appointment with Dr. Newell Coral, to continue to address spinal stenosis.  The patient has a history of DM2.  His last A1C was 7.6.  The patient's blood sugars have been checked at his SNF.  He notes no symptoms of tremulousness, diaphoresis, AMS, blurry vision, polyuria, or polydipsia.  The patient had an eye exam scheduled for earlier this month, but it was cancelled due to his hospitalization.  The patient continues to follow with psychiatry.  Abilify is being weaned off due to sedation and decreased expressiveness.  ROS: General: no fevers, chills, changes in weight, changes in appetite Skin: no rash HEENT: no blurry vision, hearing changes, sore throat Pulm: no dyspnea, coughing, wheezing CV: no chest pain, palpitations, shortness of breath Abd: no abdominal pain, nausea/vomiting, diarrhea/constipation GU: no dysuria, hematuria, polyuria Ext: no arthralgias, myalgias Neuro: no weakness, numbness, or tingling  Filed Vitals:   03/25/13 1654  BP: 132/72  Pulse:   Temp:     PEX General: alert, cooperative, and in no apparent distress HEENT: pupils equal round and reactive to light, vision grossly intact, oropharynx clear and non-erythematous  Neck: supple, no lymphadenopathy Lungs: clear to ascultation bilaterally, normal work of respiration, no wheezes, rales, ronchi Heart: regular rate and rhythm, no murmurs, gallops, or rubs Abdomen: soft, non-tender, non-distended, normal bowel sounds Extremities: no  cyanosis, clubbing, or edema Neurologic: alert & oriented X3, cranial nerves II-XII intact, strength 4/5 in left shoulder, otherwise 5/5 throughout bilateral UE and LE.  Sensation grossly intact.  Current Outpatient Prescriptions on File Prior to Visit  Medication Sig Dispense Refill  . amLODipine (NORVASC) 10 MG tablet Take 10 mg by mouth daily.      . ARIPiprazole (ABILIFY PO) Take 1 tablet by mouth daily.      . carvedilol (COREG) 6.25 MG tablet Take 3 tablets (18.75 mg total) by mouth 2 (two) times daily with a meal.  180 tablet  1  . clopidogrel (PLAVIX) 75 MG tablet Take 1 tablet (75 mg total) by mouth daily.  30 tablet  11  . cyclobenzaprine (FLEXERIL) 5 MG tablet Take 1 tablet (5 mg total) by mouth 3 (three) times daily as needed.  90 tablet  6  . donepezil (ARICEPT) 10 MG tablet Take 1 tablet (10 mg total) by mouth at bedtime.  30 tablet  5  . eplerenone (INSPRA) 50 MG tablet Take 1 tablet (50 mg total) by mouth 2 (two) times daily.  60 tablet  3  . furosemide (LASIX) 80 MG tablet Take 80 mg by mouth daily.      Marland Kitchen gabapentin (NEURONTIN) 600 MG tablet Take 1 tablet (600 mg total) by mouth 3 (three) times daily.  90 tablet  5  . insulin glargine (LANTUS) 100 UNIT/ML injection Inject 0.24 mLs (24 Units total) into the skin at bedtime.  15 mL  12  . lisinopril (PRINIVIL,ZESTRIL) 20 MG tablet Take 20 mg by mouth daily.      . simvastatin (ZOCOR) 40 MG tablet Take 40 mg by  mouth at bedtime.      Marland Kitchen terazosin (HYTRIN) 5 MG capsule Take 1 capsule (5 mg total) by mouth at bedtime.  90 capsule  3   No current facility-administered medications on file prior to visit.    Assessment/Plan

## 2013-03-25 NOTE — Assessment & Plan Note (Signed)
Hospital follow-up for weakness/fall.  Likely multifactorial, with elements of deconditioning, deficits from prior CVA, dementia, and neuropathy.  Symptoms are improving with PT at his SNF. -continue PT at SNF -stressed the importance of continuing these exercises after discharge to maintain strength and stability

## 2013-03-25 NOTE — Assessment & Plan Note (Signed)
Lab Results  Component Value Date   HGBA1C 7.6 02/11/2013   HGBA1C 7.5 08/20/2012   HGBA1C 6.1* 05/05/2012     Assessment: Diabetes control: good control (HgbA1C at goal) Progress toward A1C goal:  at goal Comments: The patient's last A1C was 7.6.  Blood sugars well-controlled.  We will continue the current regimen, and recheck A1C at his next visit.  Plan: Medications:  continue current medications Home glucose monitoring: Frequency: 2 times a day Timing: before meals Instruction/counseling given: reminded to get eye exam Educational resources provided: brochure Self management tools provided:   Other plans: Patient's wife prefers to make the patient's eye exam on her own.  Will check lipid panel today.

## 2013-03-26 ENCOUNTER — Encounter: Payer: Self-pay | Admitting: Neurology

## 2013-03-26 DIAGNOSIS — G458 Other transient cerebral ischemic attacks and related syndromes: Secondary | ICD-10-CM | POA: Insufficient documentation

## 2013-03-26 DIAGNOSIS — F329 Major depressive disorder, single episode, unspecified: Secondary | ICD-10-CM | POA: Insufficient documentation

## 2013-03-26 DIAGNOSIS — G459 Transient cerebral ischemic attack, unspecified: Secondary | ICD-10-CM | POA: Insufficient documentation

## 2013-03-26 DIAGNOSIS — I639 Cerebral infarction, unspecified: Secondary | ICD-10-CM | POA: Insufficient documentation

## 2013-03-26 DIAGNOSIS — F32A Depression, unspecified: Secondary | ICD-10-CM | POA: Insufficient documentation

## 2013-03-26 LAB — LIPID PANEL
HDL: 31 mg/dL — ABNORMAL LOW (ref >39)
LDL Cholesterol: 40 mg/dL (ref 0–99)
Total CHOL/HDL Ratio: 3.6 Ratio
Triglycerides: 202 mg/dL — ABNORMAL HIGH (ref <150)

## 2013-03-26 NOTE — Progress Notes (Signed)
Case discussed with Dr. Brown at the time of visit. We reviewed the resident's history and exam and pertinent patient test results. I agree with the assessment, diagnosis and plan of care documented in the resident's note. 

## 2013-04-01 ENCOUNTER — Ambulatory Visit (HOSPITAL_COMMUNITY): Payer: Self-pay | Admitting: Psychiatry

## 2013-04-16 ENCOUNTER — Encounter: Payer: Self-pay | Admitting: Neurology

## 2013-04-16 ENCOUNTER — Ambulatory Visit (INDEPENDENT_AMBULATORY_CARE_PROVIDER_SITE_OTHER): Payer: Medicare Other | Admitting: Neurology

## 2013-04-16 VITALS — BP 124/59 | HR 51 | Temp 98.5°F | Ht 66.0 in | Wt 218.0 lb

## 2013-04-16 DIAGNOSIS — R413 Other amnesia: Secondary | ICD-10-CM

## 2013-04-16 NOTE — Patient Instructions (Addendum)
Continue aricept .Check vitamin B12, TSH, RPR and EEG for treatable causes of memory loss. Continue Plavix for stroke prevention her strict control of hypertension with blood pressure goal below 130/80 and lipids with LDL cholesterol goal below 70 mg percent and diabetes with hemoglobin A1c goal below 6.5%. Return for followup in 2 months.

## 2013-04-19 NOTE — Progress Notes (Signed)
[facility] [address] [place]. [place] [address] [phone]       OFFICE FOLLOW-UP NOTE  [name] Date of Birth:  12-16-1940 Medical Record Number:  [id number]   HPI: 72 year male with right hemispheric TIA in September 2011 and vascular risk factors of HT, hyperlipidimia, diabetes and sleep apnoea. History of right MCA aneurysm clipping in 2009.Mild cognitive impairment versus early dementia. He returns for f/u after last visit on 09/23/2012.He was admitted to [facility] on 03/10/13 with generalized weakness and deconditioning. MRI brain showed no acute abnormalities. He went for rehab to SNF and is improving with PT/OT and is now walking with a walker.He has noticed worsening of his memory and cognitive difficulties. He forgets recent information and needs more help with ADLs like shaving and combing hair.he has been on aricept for a while and is tolerating it well.  ROS:   14 system review of systems is positive for swelling in legs, hearing loss,blurred vision,snoring, incontinence  PMH:  Past Medical History  Diagnosis Date  . Stroke     left thalamic infarct  . Diabetes mellitus   . Hypertension   . 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 TIA (transient ischemic attack)     multiple in past, most recently in 2011  . Sleep apnea   . CHF (congestive heart failure)   . Depression   . Hyperlipemia   . Arthritis     Social History:  History   Social History  . Marital Status: Married    Spouse Name: [name]    Number of Children: 3  . Years of Education: HS   Occupational History  . Not on file.   Social History Main Topics  . Smoking status: Former Smoker -- 1.50 packs/day for 15 years    Types: Cigarettes    Quit date: 10/16/1983  . Smokeless tobacco: Never Used     Comment: Quit smoking 30 yrs ago   . Alcohol Use: No  . Drug  Use: No  . Sexually Active: Not on file   Other Topics Concern  . Not on file   Social History Narrative   Pt lives at home with spouse. Currently in rehab at [name].   Caffeine Use: 1 cup daily    Medications:   Current Outpatient Prescriptions on File Prior to Visit  Medication Sig Dispense Refill  . amLODipine (NORVASC) 10 MG tablet Take 10 mg by mouth daily.      . ARIPiprazole (ABILIFY PO) Take 1 tablet by mouth daily.      . carvedilol (COREG) 6.25 MG tablet Take 3 tablets (18.75 mg total) by mouth 2 (two) times daily with a meal.  180 tablet  1  . clopidogrel (PLAVIX) 75 MG tablet Take 1 tablet (75 mg total) by mouth daily.  30 tablet  11  . cyclobenzaprine (FLEXERIL) 5 MG tablet Take 1 tablet (5 mg total) by mouth 3 (three) times daily as needed.  90 tablet  6  . donepezil (ARICEPT) 10 MG tablet Take 1 tablet (10 mg total) by mouth at bedtime.  30 tablet  5  . eplerenone (INSPRA) 50 MG tablet Take 1 tablet (50 mg total) by mouth 2 (two) times daily.  60 tablet  3  . furosemide (LASIX) 80 MG tablet Take 40 mg by mouth daily.       Marland Kitchen  gabapentin (NEURONTIN) 600 MG tablet Take 1 tablet (600 mg total) by mouth 3 (three) times daily.  90 tablet  5  . insulin glargine (LANTUS) 100 UNIT/ML injection Inject 0.24 mLs (24 Units total) into the skin at bedtime.  15 mL  12  . lisinopril (PRINIVIL,ZESTRIL) 20 MG tablet Take 20 mg by mouth daily.      . simvastatin (ZOCOR) 40 MG tablet Take 40 mg by mouth at bedtime.      Marland Kitchen terazosin (HYTRIN) 5 MG capsule Take 1 capsule (5 mg total) by mouth at bedtime.  90 capsule  3   No current facility-administered medications on file prior to visit.    Allergies:   Allergies  Allergen Reactions  . Pioglitazone Other (See Comments)    Edema   . Rosiglitazone Maleate Other (See Comments)    edema   Filed Vitals:   04/16/13 1510  BP: 124/59  Pulse: 51  Temp: 98.5 F (36.9 C)     Physical Exam General: well developed, well nourished,  seated, in no evident distress Head: head normocephalic and atraumatic. 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 1 + edema feet bilaterally. Vascular:  Normal pulses all extremities  Neurologic Exam Mental Status: Awake and fully alert. Oriented to place and time. MMSE 26/30 with deficits in attention, comprehension and following commands.Animal Naming test scored 10 only. Clock drawing  2/4.Geriatric depression scale  4-not depressed.  Cranial Nerves: Fundoscopic exam reveals sharp disc margins. Pupils equal, briskly reactive to light. Extraocular movements full without nystagmus. Visual fields full to confrontation. Hearing intact. Facial sensation intact. Face, tongue, palate moves normally and symmetrically.  Motor: Normal bulk and tone. Normal strength in all tested extremity muscles.Linted shoulder abduction left more than right due to frozen shoulder. Mild diminished fine finger movements on left and orbits right over left upper extremity. Sensory.: intact to tough and pinprick and vibratory.  Coordination: Rapid alternating movements normal in all extremities. Finger-to-nose and heel-to-shin performed accurately bilaterally. Gait and Station: Arises from chair without difficulty. Stance is broad based. Gait demonstrates normal stride length and balance . Not able to heel, toe and tandem walk without difficulty.  Reflexes: 1+ and symmetric. Toes downgoing.     ASSESSMENT  72 year male with right hemispheric TIA in September 2011 and vascular risk factors of HT, hyperlipidimia, diabetes and sleep apnoea. History of right MCA aneurysm clipping in 2009.Mild progression of cognitive impairment now likely early dementia.  PLAN: Check dementia panel labs and EEG.Continue Aricept for dementia.Continue  clopidogrel 75 mg orally every day  for secondary stroke prevention and maintain strict control of hypertension with blood pressure goal below 130/90, diabetes with hemoglobin A1c goal below 6.5% and lipids with LDL cholesterol goal below 100 mg/dL. Followup in the future with me in 2 months.

## 2013-04-23 ENCOUNTER — Other Ambulatory Visit: Payer: Self-pay

## 2013-05-10 ENCOUNTER — Other Ambulatory Visit: Payer: Self-pay | Admitting: Internal Medicine

## 2013-05-11 ENCOUNTER — Other Ambulatory Visit: Payer: Self-pay | Admitting: *Deleted

## 2013-05-11 MED ORDER — AMLODIPINE BESYLATE 10 MG PO TABS: 10.0000 mg | ORAL_TABLET | Freq: Every day | ORAL | Status: DC

## 2013-05-12 ENCOUNTER — Other Ambulatory Visit (INDEPENDENT_AMBULATORY_CARE_PROVIDER_SITE_OTHER): Payer: Medicare Other

## 2013-05-12 DIAGNOSIS — R413 Other amnesia: Secondary | ICD-10-CM

## 2013-05-15 ENCOUNTER — Telehealth: Payer: Self-pay | Admitting: *Deleted

## 2013-05-15 NOTE — Telephone Encounter (Signed)
Agree with appointment. Thank you!  

## 2013-05-15 NOTE — Telephone Encounter (Signed)
Harriett Sine OT with [facility] called to report BP 05/04/13 178/84 and 05/11/13 181/83. Family has not been there while [place] is there. Will have [facility] nurse to check pt. Suggest to make appt - Harriett Sine states she will tell family to call [facility]. Unsure about pt getting med - no pill box. Front office staff aware to try and get pt appt  first next week if there is transportation. Stanton Kidney Jillayne Witte RN 05/15/13  11:45AM

## 2013-05-20 ENCOUNTER — Encounter: Payer: Self-pay | Admitting: Internal Medicine

## 2013-05-20 ENCOUNTER — Ambulatory Visit (INDEPENDENT_AMBULATORY_CARE_PROVIDER_SITE_OTHER): Payer: Medicare Other | Admitting: Internal Medicine

## 2013-05-20 VITALS — BP 153/74 | HR 71 | Temp 97.1°F | Ht 67.0 in | Wt 216.0 lb

## 2013-05-20 DIAGNOSIS — R5381 Other malaise: Secondary | ICD-10-CM

## 2013-05-20 DIAGNOSIS — E119 Type 2 diabetes mellitus without complications: Secondary | ICD-10-CM

## 2013-05-20 DIAGNOSIS — E785 Hyperlipidemia, unspecified: Secondary | ICD-10-CM

## 2013-05-20 DIAGNOSIS — R531 Weakness: Secondary | ICD-10-CM

## 2013-05-20 DIAGNOSIS — I10 Essential (primary) hypertension: Secondary | ICD-10-CM

## 2013-05-20 LAB — GLUCOSE, CAPILLARY: Glucose-Capillary: 259 mg/dL — ABNORMAL HIGH (ref 70–99)

## 2013-05-20 LAB — POCT GLYCOSYLATED HEMOGLOBIN (HGB A1C): Hemoglobin A1C: 7.1

## 2013-05-20 MED ORDER — SIMVASTATIN 40 MG PO TABS: 40.0000 mg | ORAL_TABLET | Freq: Every day | ORAL | Status: DC

## 2013-05-20 NOTE — Patient Instructions (Signed)
Follow-up with Dr. Aundria Rud or Dr. Manson Passey in 1-2 months.   After you take your medicines, remember to double check that you have taken all of them by looking inside the cup and the tapping the cup against the table or your cane to listen for remaining pills.  Your wife will also try to remember to look in the cup to be sure none of the pills are still there.    Be sure you have been resting for 5 minutes before the physical therapists check your blood pressure.  Please remember to bring your blood pressure log book to your next appointment.   Managing Your High Blood Pressure Blood pressure is a measurement of how forceful your blood is pressing against the walls of the arteries. Arteries are muscular tubes within the circulatory system. Blood pressure does not stay the same. Blood pressure rises when you are active, excited, or nervous; and it lowers during sleep and relaxation. If the numbers measuring your blood pressure stay above normal most of the time, you are at risk for health problems. High blood pressure (hypertension) is a long-term (chronic) condition in which blood pressure is elevated. A blood pressure reading is recorded as two numbers, such as 120 over 80 (or 120/80). The first, higher number is called the systolic pressure. It is a measure of the pressure in your arteries as the heart beats. The second, lower number is called the diastolic pressure. It is a measure of the pressure in your arteries as the heart relaxes between beats.  Keeping your blood pressure in a normal range is important to your overall health and prevention of health problems, such as heart disease and stroke. When your blood pressure is uncontrolled, your heart has to work harder than normal. High blood pressure is a very common condition in adults because blood pressure tends to rise with age. Men and women are equally likely to have hypertension but at different times in life. Before age 4, men are more likely to  have hypertension. After 70 years of age, women are more likely to have it. Hypertension is especially common in African Americans. This condition often has no signs or symptoms. The cause of the condition is usually not known. Your caregiver can help you come up with a plan to keep your blood pressure in a normal, healthy range. BLOOD PRESSURE STAGES Blood pressure is classified into four stages: normal, prehypertension, stage 1, and stage 2. Your blood pressure reading will be used to determine what type of treatment, if any, is necessary. Appropriate treatment options are tied to these four stages:  Normal  Systolic pressure (mm Hg): below 120.  Diastolic pressure (mm Hg): below 80. Prehypertension  Systolic pressure (mm Hg): 120 to 139.  Diastolic pressure (mm Hg): 80 to 89. Stage1  Systolic pressure (mm Hg): 140 to 159.  Diastolic pressure (mm Hg): 90 to 99. Stage2  Systolic pressure (mm Hg): 160 or above.  Diastolic pressure (mm Hg): 100 or above. RISKS RELATED TO HIGH BLOOD PRESSURE Managing your blood pressure is an important responsibility. Uncontrolled high blood pressure can lead to:  A heart attack.  A stroke.  A weakened blood vessel (aneurysm).  Heart failure.  Kidney damage.  Eye damage.  Metabolic syndrome.  Memory and concentration problems. HOW TO MANAGE YOUR BLOOD PRESSURE Blood pressure can be managed effectively with lifestyle changes and medicines (if needed). Your caregiver will help you come up with a plan to bring your blood pressure within a  normal range. Your plan should include the following: Education  Read all information provided by your caregivers about how to control blood pressure.  Educate yourself on the latest guidelines and treatment recommendations. New research is always being done to further define the risks and treatments for high blood pressure. Lifestylechanges  Control your weight.  Avoid smoking.  Stay physically  active.  Reduce the amount of salt in your diet.  Reduce stress.  Control any chronic conditions, such as high cholesterol or diabetes.  Reduce your alcohol intake. Medicines  Several medicines (antihypertensive medicines) are available, if needed, to bring blood pressure within a normal range. Communication  Review all the medicines you take with your caregiver because there may be side effects or interactions.  Talk with your caregiver about your diet, exercise habits, and other lifestyle factors that may be contributing to high blood pressure.  See your caregiver regularly. Your caregiver can help you create and adjust your plan for managing high blood pressure. RECOMMENDATIONS FOR TREATMENT AND FOLLOW-UP  The following recommendations are based on current guidelines for managing high blood pressure in nonpregnant adults. Use these recommendations to identify the proper follow-up period or treatment option based on your blood pressure reading. You can discuss these options with your caregiver.  Systolic pressure of 120 to 139 or diastolic pressure of 80 to 89: Follow up with your caregiver as directed.  Systolic pressure of 140 to 160 or diastolic pressure of 90 to 100: Follow up with your caregiver within 2 months.  Systolic pressure above 160 or diastolic pressure above 100: Follow up with your caregiver within 1 month.  Systolic pressure above 180 or diastolic pressure above 110: Consider antihypertensive therapy; follow up with your caregiver within 1 week.  Systolic pressure above 200 or diastolic pressure above 120: Begin antihypertensive therapy; follow up with your caregiver within 1 week. Document Released: 06/25/2012 Document Reviewed: 06/25/2012 [facility] Patient Information 2014 [place], [place].

## 2013-05-20 NOTE — Assessment & Plan Note (Addendum)
BP Readings from Last 3 Encounters:  05/20/13 153/74  04/16/13 124/59  09/23/12 182/50    Lab Results  Component Value Date   NA 142 03/15/2013   K 4.3 03/15/2013   CREATININE 1.16 03/15/2013    Assessment: Pt has not been regularly taking his anti-hypertensives in past month or so due to new medication container (per HPI), likely the reason for his elevated BP readings in late July as measured by home OT.  We discussed BP only being taken when pt is seated and has been resting for at least 5 minutes, he states that PT/OT checks his BP right when they arrive while he is seated.   Blood pressure control: moderately elevated Progress toward BP goal:  deteriorated Comments: Initial BP check was moderately elevated (153/74), but recheck shows good BP control(134/66).   Plan: Pt will work on taking all of the medications that his wife sets out twice daily per above.  We discussed at length various options for helping patient get all of his medications.  Pt agreed to checking the medication container after he has taken his meds for remaining pills- will both look inside and then shake container and tap it against the table or his cane to ensure all pills have been taken.  His wife will also check the container as soon as she returns from work in the afternoon and before going to bed.  We also discussed setting an alarm to remind the pt it is time to take his medicines instead of just setting the medicines next to his breakfast bowl and dinner plate.  Pt will continue to have his blood pressure checked twice weekly by home PT/OT, will follow-up with [place] in a month and will bring his BP log and medicines at that time.  Referral to [facility] via social work for additional resources if needed.  Medications:  continue current medications Educational resources provided:   Self management tools provided:   Other plans: Patient will follow-up in one month for BP check.  Need to check in about system for reliably taking  meds.  [name] will also bring PT log book of BPs.

## 2013-05-20 NOTE — Assessment & Plan Note (Addendum)
Lab Results  Component Value Date   HGBA1C 7.1 05/20/2013   HGBA1C 7.6 02/11/2013   HGBA1C 7.5 08/20/2012     Assessment: Diabetes control: fair control (A1C very near goal) Progress toward A1C goal:  improved Comments: A1C today is 7.1, down from 7.6 in 4/14.  Blood sugars well-controlled on current regimen.   Plan: Medications:  continue current medications Home glucose monitoring: Frequency:   Timing:   Instruction/counseling given: reminded to get eye exam Educational resources provided:   Self management tools provided:   Other plans: Pt to bring glucometer to next visit. Eye exam on retina scanner at next visit.

## 2013-05-20 NOTE — Progress Notes (Signed)
Patient ID: [name], male   DOB: 1940/11/29, 72 y.o.   MRN: [id number]   Subjective:   Patient ID: [name] male   DOB: Jan 29, 1941 72 y.o.   MRN: [id number]  HPI: HenryHenry Bates is a 72 y.o. man with history of DM2, HTN, HL, prior CVA presenting for blood pressure check.    Patient's blood pressure was well controlled at 132/72 at his last visit with Dr. Manson Passey on 03/25/13, but patient's home OT called [facility] on 8/1 to report blood pressure reads of 178/84 on 7/21 and 181/83 on 7/28. Patient's initial blood pressure in clinic today was 153/74 and repeat at the end of the visit was 134/66.   Patient's wife, who provided the majority of the history, states that [name] has not reliably been taking his blood pressure medicines since his last visit in June.  She has a pill organizer that she fills weekly with her husband's medicines and then each day, she sets the patient's medicines out in a cup next to his breakfast bowl in the morning and next to his dinner plate at night.  This system has been in place for some time now.  However, they have started using a different container with a lid on it for the medicines due to an incident of one of their young grandchildren taking some of [name] medicines. Since that time, she has often found pills remaining in the medication cup, likely due to patient not being able to see the pills he has not taken.  This has been happening almost daily, particularly with the anti-hypertensive medications since they are smaller than some of the others.   Patient also has DM2.  His last A1C in 4/14 was 7.6, his A1C today is 7.1.  He states he is taking his Lantus as prescribed and has not had tremulousness, diaphoresis, blurry vision, polyuria, polydipsia.  Patient will have an eye exam on the retinal scanner at his next visit as his wife is trying to get on to work today.  Patient was hospitalized at the beginning of June for a fall with  associated generalized weakness (thought to be multifactorial from CVA deficits, deconditioning, etc).  He participated in PT at a SNF and now his seeing home health PT twice weekly.  He feels that his strength is improved, and he is getting close to his baseline.  He has not had any recent falls.  Patient also follows with psychiatry who manages his CNS medications.    Past Medical History  Diagnosis Date  . Stroke     left thalamic infarct  . Diabetes mellitus   . Hypertension   . 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 TIA (transient ischemic attack)     multiple in past, most recently in 2011  . Sleep apnea   . CHF (congestive heart failure)   . Depression   . Hyperlipemia   . Arthritis    Current Outpatient Prescriptions  Medication Sig Dispense Refill  . amLODipine (NORVASC) 10 MG tablet Take 1 tablet (10 mg total) by mouth daily.  90 tablet  3  . ARIPiprazole (ABILIFY PO) Take 1 tablet by mouth daily.      . carvedilol (COREG) 6.25 MG tablet Take 3 tablets (18.75 mg total) by mouth 2 (two) times daily with a meal.  180 tablet  1  . clopidogrel (  PLAVIX) 75 MG tablet Take 1 tablet (75 mg total) by mouth daily.  30 tablet  11  . cyclobenzaprine (FLEXERIL) 5 MG tablet Take 1 tablet (5 mg total) by mouth 3 (three) times daily as needed.  90 tablet  6  . donepezil (ARICEPT) 10 MG tablet Take 1 tablet (10 mg total) by mouth at bedtime.  30 tablet  5  . eplerenone (INSPRA) 50 MG tablet Take 1 tablet (50 mg total) by mouth 2 (two) times daily.  60 tablet  3  . furosemide (LASIX) 80 MG tablet TAKE ONE TABLET BY MOUTH EVERY DAY  30 tablet  11  . gabapentin (NEURONTIN) 600 MG tablet Take 1 tablet (600 mg total) by mouth 3 (three) times daily.  90 tablet  5  . Hypromellose (NATURAL BALANCE TEARS) 0.4 % SOLN Apply 2 drops to eye 3 (three) times daily.      . insulin glargine (LANTUS)  100 UNIT/ML injection Inject 0.24 mLs (24 Units total) into the skin at bedtime.  15 mL  12  . lisinopril (PRINIVIL,ZESTRIL) 20 MG tablet TAKE ONE TABLET BY MOUTH EVERY DAY  30 tablet  11  . simvastatin (ZOCOR) 40 MG tablet Take 1 tablet (40 mg total) by mouth at bedtime.  30 tablet  3  . terazosin (HYTRIN) 5 MG capsule Take 1 capsule (5 mg total) by mouth at bedtime.  90 capsule  3  . Aluminum & Magnesium Hydroxide (LIQUID ANTACID PO) Take by mouth. 30cc PO q4h prn       No current facility-administered medications for this visit.   Family History  Problem Relation Age of Onset  . Colon cancer Neg Hx   . Diabetes Sister   . Diabetes Brother   . Diabetes Paternal Aunt   . Stroke Mother   . Pneumonia Father    History   Social History  . Marital Status: Married    Spouse Name: [name]    Number of Children: 3  . Years of Education: HS   Social History Main Topics  . Smoking status: Former Smoker -- 1.50 packs/day for 15 years    Types: Cigarettes    Quit date: 10/16/1983  . Smokeless tobacco: Never Used     Comment: Quit smoking 30 yrs ago   . Alcohol Use: No  . Drug Use: No  . Sexually Active: None   Other Topics Concern  . None   Social History Narrative   Pt lives at home with spouse. Currently in rehab at [facility].   Caffeine Use: 1 cup daily   Review of Systems: Review of Systems  Constitutional: Negative for fever, chills and weight loss.  HENT: Negative for hearing loss and congestion.   Eyes: Negative for blurred vision.  Respiratory: Negative for cough, sputum production and shortness of breath.   Cardiovascular: Negative for chest pain and palpitations.  Gastrointestinal: Negative for nausea, vomiting, abdominal pain, diarrhea and constipation.  Genitourinary: Negative for dysuria.  Musculoskeletal: Negative for myalgias, back pain and falls.  Neurological: Negative for dizziness, tingling, weakness and headaches.  Endo/Heme/Allergies: Negative for  polydipsia.    Objective:  Physical Exam: Filed Vitals:   05/20/13 1005  BP: 153/74  Pulse: 71  Temp: 97.1 F (36.2 C)  TempSrc: Oral  Height: 5\' 7"  (1.702 m)  Weight: 216 lb (97.977 kg)  SpO2: 94%   General: alert, cooperative, and in no apparent distress HEENT: vision grossly intact, oropharynx clear and non-erythematous  Neck: supple, no lymphadenopathy, JVD, or  carotid bruits Lungs: clear to ascultation bilaterally, normal work of respiration, no wheezes, rales, ronchi Heart: regular rate and rhythm, no murmurs, gallops, or rubs Abdomen: soft, non-tender, non-distended, normal bowel sounds Extremities: no cyanosis, clubbing, or edema Neurologic: alert & oriented X3, cranial nerves II-XII intact, strength grossly intact, sensation intact to light touch  Assessment & Plan:  Patient discussed with Dr. Rogelia Boga.  Please see problem-based assessment and plan.

## 2013-05-20 NOTE — Assessment & Plan Note (Signed)
Strength continues to improve with home health PT twice weekly.  Patient feels he is getting back to his baseline.

## 2013-05-21 NOTE — Progress Notes (Signed)
I saw and evaluated the patient.  I personally confirmed the key portions of the history and exam documented by Dr. Rogers and I reviewed pertinent patient test results.  The assessment, diagnosis, and plan were formulated together and I agree with the documentation in the resident's note. 

## 2013-05-25 ENCOUNTER — Other Ambulatory Visit: Payer: Self-pay | Admitting: Licensed Clinical Social Worker

## 2013-05-25 ENCOUNTER — Telehealth: Payer: Self-pay | Admitting: Licensed Clinical Social Worker

## 2013-05-25 DIAGNOSIS — I10 Essential (primary) hypertension: Secondary | ICD-10-CM

## 2013-05-25 NOTE — Telephone Encounter (Signed)
[name] was referred to CSW for referral to community care management.  However, pt's insurance is not contracted to a community care [employer] teacher.  CSW placed call to pt to determine needs.  CSW spoke with pt's spouse, who is his primary caregiver.  Pt is having difficulty remembering to take his medications even when spouse sets up medication box.  Spouse states pt is receiving home health services through [place] and is under the assumption [facility] RN is coming out.  CSW discussed options available to spouse including [facility] RN teaching how to use medication box, adult day care options and private duty care options.  CSW will send Mrs. Phaneuf resources in the mail.  CSW placed call to [facility], pt is active with PT/OT.  Referral for [facility] RN on chart, faxed to [place].  Family is aware CSW is available to assist as needed. CSW will sign off.

## 2013-06-17 ENCOUNTER — Ambulatory Visit (INDEPENDENT_AMBULATORY_CARE_PROVIDER_SITE_OTHER): Payer: Medicare Other | Admitting: Nurse Practitioner

## 2013-06-17 ENCOUNTER — Encounter: Payer: Self-pay | Admitting: Nurse Practitioner

## 2013-06-17 VITALS — BP 137/59 | HR 76 | Temp 99.1°F | Ht 64.0 in | Wt 213.0 lb

## 2013-06-17 DIAGNOSIS — E119 Type 2 diabetes mellitus without complications: Secondary | ICD-10-CM

## 2013-06-17 DIAGNOSIS — M24819 Other specific joint derangements of unspecified shoulder, not elsewhere classified: Secondary | ICD-10-CM

## 2013-06-17 DIAGNOSIS — I6350 Cerebral infarction due to unspecified occlusion or stenosis of unspecified cerebral artery: Secondary | ICD-10-CM

## 2013-06-17 DIAGNOSIS — R413 Other amnesia: Secondary | ICD-10-CM

## 2013-06-17 DIAGNOSIS — F32A Depression, unspecified: Secondary | ICD-10-CM

## 2013-06-17 DIAGNOSIS — I639 Cerebral infarction, unspecified: Secondary | ICD-10-CM

## 2013-06-17 DIAGNOSIS — F329 Major depressive disorder, single episode, unspecified: Secondary | ICD-10-CM

## 2013-06-17 DIAGNOSIS — F3289 Other specified depressive episodes: Secondary | ICD-10-CM

## 2013-06-17 NOTE — Progress Notes (Signed)
[facility]  PATIENT: [name] DOB: [birth year]   HISTORY FROM: patient, spouse REASON FOR VISIT: follow up memory loss   HISTORICAL  CHIEF COMPLAINT:  Chief Complaint  Patient presents with  . Follow-up    2 mo    HISTORY OF PRESENT ILLNESS: 72 year male with right hemispheric TIA in September 2011 and vascular risk factors of uncontrolled HT for many years, hyperlipidimia, diabetes and sleep apnoea. History of right MCA aneurysm clipping in 2009.Mild cognitive impairment versus early dementia.  He returns for f/u after last visit on 09/23/2012.He was admitted to [facility] on 03/10/13 with generalized weakness and deconditioning. MRI brain showed no acute abnormalities. He went for rehab to SNF and is improving with PT/OT and is now walking with a walker.  He has noticed worsening of his memory and cognitive difficulties.  He forgets recent information and needs more help with ADLs like shaving and combing hair.  He has been on aricept for a while and is tolerating it well.  UPDATE 06/17/13 (LL): Patient returns for followup since last visit on 04/16/13.  Dementia panel labs were checked after last visit, TSH, B12, RPR all normal.  EEG showed non-specific slowing.  Patient is accompanied by wife whostates there has been no change in his memory since last visit, but he has completed PT.  He ambulates slow but steady with walker.  He has had no falls at home.  Wife states that he sits in recliner most of the day, watches television and naps.     ROS:  14 system review of systems is positive for fatigue, swelling in legs, hearing loss, feeling cold, constipation,snoring, incontinence, joint swelling, memory loss, confusion, depression, too much sleep, decreased energy, disinterest in activities.  ALLERGIES: Allergies  Allergen Reactions  . Pioglitazone Other (See Comments)    Edema   . Rosiglitazone Maleate Other (See Comments)    edema    HOME  MEDICATIONS: Outpatient Prescriptions Prior to Visit  Medication Sig Dispense Refill  . Aluminum & Magnesium Hydroxide (LIQUID ANTACID PO) Take by mouth. 30cc PO q4h prn      . amLODipine (NORVASC) 10 MG tablet Take 1 tablet (10 mg total) by mouth daily.  90 tablet  3  . ARIPiprazole (ABILIFY PO) Take 1 tablet by mouth daily.      . carvedilol (COREG) 6.25 MG tablet Take 3 tablets (18.75 mg total) by mouth 2 (two) times daily with a meal.  180 tablet  1  . clopidogrel (PLAVIX) 75 MG tablet Take 1 tablet (75 mg total) by mouth daily.  30 tablet  11  . cyclobenzaprine (FLEXERIL) 5 MG tablet Take 1 tablet (5 mg total) by mouth 3 (three) times daily as needed.  90 tablet  6  . donepezil (ARICEPT) 10 MG tablet Take 1 tablet (10 mg total) by mouth at bedtime.  30 tablet  5  . eplerenone (INSPRA) 50 MG tablet Take 1 tablet (50 mg total) by mouth 2 (two) times daily.  60 tablet  3  . furosemide (LASIX) 80 MG tablet TAKE ONE TABLET BY MOUTH EVERY DAY  30 tablet  11  . gabapentin (NEURONTIN) 600 MG tablet Take 1 tablet (600 mg total) by mouth 3 (three) times daily.  90 tablet  5  . Hypromellose (NATURAL BALANCE TEARS) 0.4 % SOLN Apply 2 drops to eye 3 (three) times daily.      . insulin glargine (LANTUS) 100 UNIT/ML injection Inject 0.24 mLs (24 Units total)  into the skin at bedtime.  15 mL  12  . lisinopril (PRINIVIL,ZESTRIL) 20 MG tablet TAKE ONE TABLET BY MOUTH EVERY DAY  30 tablet  11  . simvastatin (ZOCOR) 40 MG tablet Take 1 tablet (40 mg total) by mouth at bedtime.  30 tablet  3  . terazosin (HYTRIN) 5 MG capsule Take 1 capsule (5 mg total) by mouth at bedtime.  90 capsule  3   No facility-administered medications prior to visit.    PAST MEDICAL HISTORY: Past Medical History  Diagnosis Date  . Stroke     left thalamic infarct  . Diabetes mellitus   . Hypertension   . 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 TIA (transient ischemic attack)     multiple in past, most recently in 2011  . Sleep apnea   . CHF (congestive heart failure)   . Depression   . Hyperlipemia   . Arthritis     PAST SURGICAL HISTORY: Past Surgical History  Procedure Laterality Date  . Knee arthroscopy      lt knee  . Shoulder arthroscopy w/ rotator cuff repair      rt shoulder  . Intracranial aneurysm repair    . 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 Colon cancer Neg Hx   . Diabetes Sister   . Diabetes Brother   . Diabetes Paternal Aunt   . Stroke Mother   . Pneumonia Father     SOCIAL HISTORY: History   Social History  . Marital Status: Married    Spouse Name: [name]    Number of Children: 3  . Years of Education: HS   Occupational History  . Not on file.   Social History Main Topics  . Smoking status: Former Smoker -- 1.50 packs/day for 15 years    Types: Cigarettes    Quit date: 10/16/1983  . Smokeless tobacco: Never Used     Comment: Quit smoking 30 yrs ago   . Alcohol Use: No  . Drug Use: No  . Sexual Activity: Not on file   Other Topics Concern  . Not on file   Social History Narrative   Pt lives at home with spouse. Currently in rehab at [facility].   Caffeine Use: 1 cup daily     PHYSICAL EXAM  Filed Vitals:   06/17/13 1420  BP: 137/59  Pulse: 76  Temp: 99.1 F (37.3 C)  Height: 5\' 4"  (1.626 m)  Weight: 213 lb (96.616 kg)   Body mass index is 36.54 kg/(m^2).  Generalized: In no acute distress, seated elderly AA male Neck: Supple, no carotid bruits  Cardiac: Regular rate rhythm, no murmur  Pulmonary: Clear to auscultation bilaterally  Musculoskeletal: right second finger partial amputation .left Frozen shoulder  Skin: no rash/petichiae. 1 + edema feet bilaterally.  Vascular: Normal pulses all extremities   Neurological examination  Mentation: Alert oriented to time, place,  language fluent, SLOW TO RESPOND, FLAT AFFECT, SOLEMNENT  (Last visit MMSE 26/30 with deficits in attention, comprehension and following commands.Animal Naming test scored 10 only. Clock drawing 2/4.Geriatric depression scale 4-not depressed.) Cranial nerve II-XII: Pupils were equal round reactive to light extraocular movements were full, visual field were full on confrontational test. facial sensation and strength were normal. hearing was intact to finger rubbing bilaterally. Uvula  tongue midline. head turning and shoulder shrug and were normal and symmetric. BILATERAL PTOSIS L>R, PARTIALLY RESTRICTING FIELD OF VISION.  MOTOR: normal bulk and tone, Normal strength in all tested extremity muscles.Limited shoulder abduction left more than right due to frozen shoulder. Mild diminished fine finger movements on left and orbits right over left upper extremity.  Sensory: intact to tough and pinprick and vibratory.  Coordination: Rapid alternating movements normal in all extremities. Finger-to-nose and heel-to-shin performed accurately bilaterally.  Gait and Station: Arises from chair without difficulty. Stance is broad based. Gait demonstrates normal stride length and balance . Not able to heel, toe and tandem walk without difficulty. Using rolling walker. Reflexes: 1+ and symmetric.    DIAGNOSTIC DATA (LABS, IMAGING, TESTING) - I reviewed patient records, labs, notes, testing and imaging myself where available.  Lab Results  Component Value Date   WBC 6.8 03/15/2013   HGB 12.2* 03/15/2013   HCT 38.7* 03/15/2013   MCV 82.0 03/15/2013   PLT 187 03/15/2013      Component Value Date/Time   NA 142 03/15/2013 2045   K 4.3 03/15/2013 2045   CL 105 03/15/2013 2045   CO2 27 03/15/2013 2045   GLUCOSE 172* 03/15/2013 2045   BUN 18 03/15/2013 2045   CREATININE 1.16 03/15/2013 2045   CREATININE 1.52* 06/25/2012 1618   CALCIUM 9.9 03/15/2013 2045   PROT 7.3 03/15/2013 2045   ALBUMIN 3.9 03/15/2013 2045   AST 21 03/15/2013 2045   ALT  19 03/15/2013 2045   ALKPHOS 67 03/15/2013 2045   BILITOT 0.6 03/15/2013 2045   GFRNONAA 62* 03/15/2013 2045   GFRAA 72* 03/15/2013 2045   Lab Results  Component Value Date   CHOL 111 03/25/2013   HDL 31* 03/25/2013   LDLCALC 40 03/25/2013   TRIG 202* 03/25/2013   CHOLHDL 3.6 03/25/2013   Lab Results  Component Value Date   HGBA1C 7.1 05/20/2013   Lab Results  Component Value Date   VITAMINB12 763 03/16/2013   Lab Results  Component Value Date   TSH 2.077 03/16/2013   RPR, TSH B12 all normal. EEG 05/23/13 This is a moderately abnormal EEG due to the presence of moderate bihemispheric slowing which is a a nonspecific finding seen in a vareity of ischemic. Toxic. Metabolic and degenerative etiologies.  MRI HEAD WO CONTRAST 03/16/13 No acute infarct. MRI CERVICAL SPINE 03/16/13 Motion degraded exam. 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  ASSESSMENT AND PLAN 72 year male with right hemispheric TIA in September 2011 and vascular risk factors of HT, hyperlipidimia, diabetes and sleep apnoea. History of right MCA aneurysm clipping in 2009.  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   PLAN:  Continue Aricept for dementia.  Continue clopidogrel 75 mg orally every day for secondary stroke prevention and maintain strict control of hypertension with blood pressure goal below 130/90, diabetes with hemoglobin A1c goal below 6.5% and lipids with LDL cholesterol goal below 100 mg/dL.  Referal to PT for Left shoulder, with Tens unit, for frozen shoulder. Application for handicapped parking placard was completed at this visit. Followup in the future with me in 3 months.  Orders Placed This Encounter  Procedures  . Ambulatory referral to Physical Therapy   Raeford Brandenburg NP-C 06/17/2013, 7:36 PM  [facility] [address] [place], [place]  [address] [phone]

## 2013-06-17 NOTE — Patient Instructions (Signed)
Continue clopidogrel 75 mg orally every day  for secondary stroke prevention and maintain strict control of hypertension with blood pressure goal below 130/90, diabetes with hemoglobin A1c goal below 6.5% and lipids with LDL cholesterol goal below 100 mg/dL.   Followup in the future with me in 6 months.

## 2013-06-24 ENCOUNTER — Encounter: Payer: Self-pay | Admitting: Internal Medicine

## 2013-06-24 ENCOUNTER — Ambulatory Visit (INDEPENDENT_AMBULATORY_CARE_PROVIDER_SITE_OTHER): Payer: Medicare Other | Admitting: Internal Medicine

## 2013-06-24 VITALS — BP 138/67 | HR 69 | Temp 97.9°F | Ht 67.0 in | Wt 213.8 lb

## 2013-06-24 DIAGNOSIS — N3941 Urge incontinence: Secondary | ICD-10-CM

## 2013-06-24 DIAGNOSIS — Z23 Encounter for immunization: Secondary | ICD-10-CM

## 2013-06-24 DIAGNOSIS — E119 Type 2 diabetes mellitus without complications: Secondary | ICD-10-CM

## 2013-06-24 DIAGNOSIS — R32 Unspecified urinary incontinence: Secondary | ICD-10-CM | POA: Insufficient documentation

## 2013-06-24 MED ORDER — TERAZOSIN HCL 5 MG PO CAPS: 5.0000 mg | ORAL_CAPSULE | Freq: Every day | ORAL | Status: DC

## 2013-06-24 MED ORDER — CARVEDILOL 6.25 MG PO TABS: 18.7500 mg | ORAL_TABLET | Freq: Two times a day (BID) | ORAL | Status: DC

## 2013-06-24 MED ORDER — EPLERENONE 50 MG PO TABS: 50.0000 mg | ORAL_TABLET | Freq: Two times a day (BID) | ORAL | Status: DC

## 2013-06-24 MED ORDER — CLOPIDOGREL BISULFATE 75 MG PO TABS: 75.0000 mg | ORAL_TABLET | Freq: Every day | ORAL | Status: DC

## 2013-06-24 MED ORDER — GABAPENTIN 600 MG PO TABS: 600.0000 mg | ORAL_TABLET | Freq: Three times a day (TID) | ORAL | Status: DC

## 2013-06-24 NOTE — Patient Instructions (Signed)
General Instructions: For your Urinary Incontinence, follow the following guidelines: 1. Urinate on a schedule, such as every 2 hours, whether or not you feel the urge to urinate 2. When you travel to public places, find the nearest bathroom as soon as you arrive 3. If your symptoms persist, we will need to refer you to a Urologist to check a Post-Void Residual test  We are checking a PSA test today for your prostate.  Please return for a follow-up visit in 3 months.   Treatment Goals:  Goals (1 Years of Data) as of 06/24/13         As of Today 06/17/13 05/20/13 04/16/13 03/25/13     Blood Pressure    . Blood Pressure < 140/90  138/67 137/59 153/74 124/59 132/72     Lifestyle    . Prevent Falls            Progress Toward Treatment Goals:  Treatment Goal 06/24/2013  Hemoglobin A1C at goal  Blood pressure at goal  Prevent falls unchanged    Self Care Goals & Plans:  Self Care Goal 06/24/2013  Manage my medications take my medicines as prescribed; bring my medications to every visit; refill my medications on time  Monitor my health bring my glucose meter and log to each visit; keep track of my blood glucose; check my feet daily  Eat healthy foods eat baked foods instead of fried foods; eat foods that are low in salt  Be physically active find an activity I enjoy    Home Blood Glucose Monitoring 06/24/2013  Check my blood sugar 2 times a day  When to check my blood sugar before meals     Care Management & Community Referrals:  Referral 06/24/2013  Referrals made for care management support none needed

## 2013-06-24 NOTE — Assessment & Plan Note (Signed)
The patient notes symptoms consistent with urge incontinence.  Given his history of stroke, however, neurogenic bladder is another consideration -will start with conservative measures for urge incontinence (urinating on a schedule, finding a bathroom immediately when travelling to public places) -if the above is unsuccessful, will refer to Urology for measurement of post-void residual

## 2013-06-24 NOTE — Progress Notes (Signed)
HPI The patient is a 72 y.o. male with a history of DM2, prior CVA, HTN, BPH, presenting for a follow-up visit.  The patient notes urinary incontinence.  He notes that on several occasions, he has felt the urge to urinate, and has walked to the bathroom, but has been urinated on himself before reaching the bathroom.  He notes no loss of urine with coughing or laughing.  He also notes symptoms of occasional weak stream, and incomplete voiding.  Symptoms have been present for the last 6-12 months, though gradually worsening.  He notes no dysuria, fevers, hematuria.  The patient has a history of DM.  His last A1C was 7.1 on 8/6.  His last eye exam was with Dr. Clydene Pugh, about 2 months ago.  He notes a few episodes of blood sugars between 70-100's, but none below 70's.  ROS: General: no fevers, chills, changes in weight, changes in appetite Skin: no rash HEENT: no blurry vision, hearing changes, sore throat Pulm: no dyspnea, coughing, wheezing CV: no chest pain, palpitations, shortness of breath Abd: no abdominal pain, nausea/vomiting, diarrhea/constipation GU: see HPI Ext: no arthralgias, myalgias Neuro: no weakness, numbness, or tingling  Filed Vitals:   06/24/13 1456  BP: 138/67  Pulse: 69  Temp: 97.9 F (36.6 C)    PEX General: alert, cooperative, and in no apparent distress HEENT: pupils equal round and reactive to light, vision grossly intact, oropharynx clear and non-erythematous  Neck: supple, no lymphadenopathy Lungs: clear to ascultation bilaterally, normal work of respiration, no wheezes, rales, ronchi Heart: regular rate and rhythm, no murmurs, gallops, or rubs Abdomen: soft, non-tender, non-distended, normal bowel sounds Extremities: 2+ DP/PT pulses bilaterally, no cyanosis, clubbing, or edema Neurologic: alert & oriented X3, cranial nerves II-XII intact, strength grossly intact, sensation intact to light touch  Current Outpatient Prescriptions on File Prior to Visit   Medication Sig Dispense Refill  . Aluminum & Magnesium Hydroxide (LIQUID ANTACID PO) Take by mouth. 30cc PO q4h prn      . amLODipine (NORVASC) 10 MG tablet Take 1 tablet (10 mg total) by mouth daily.  90 tablet  3  . ARIPiprazole (ABILIFY PO) Take 1 tablet by mouth daily.      . carvedilol (COREG) 6.25 MG tablet Take 3 tablets (18.75 mg total) by mouth 2 (two) times daily with a meal.  180 tablet  1  . clopidogrel (PLAVIX) 75 MG tablet Take 1 tablet (75 mg total) by mouth daily.  30 tablet  11  . cyclobenzaprine (FLEXERIL) 5 MG tablet Take 1 tablet (5 mg total) by mouth 3 (three) times daily as needed.  90 tablet  6  . donepezil (ARICEPT) 10 MG tablet Take 1 tablet (10 mg total) by mouth at bedtime.  30 tablet  5  . eplerenone (INSPRA) 50 MG tablet Take 1 tablet (50 mg total) by mouth 2 (two) times daily.  60 tablet  3  . furosemide (LASIX) 80 MG tablet TAKE ONE TABLET BY MOUTH EVERY DAY  30 tablet  11  . gabapentin (NEURONTIN) 600 MG tablet Take 1 tablet (600 mg total) by mouth 3 (three) times daily.  90 tablet  5  . Hypromellose (NATURAL BALANCE TEARS) 0.4 % SOLN Apply 2 drops to eye 3 (three) times daily.      . insulin glargine (LANTUS) 100 UNIT/ML injection Inject 0.24 mLs (24 Units total) into the skin at bedtime.  15 mL  12  . lisinopril (PRINIVIL,ZESTRIL) 20 MG tablet TAKE ONE TABLET BY MOUTH  EVERY DAY  30 tablet  11  . simvastatin (ZOCOR) 40 MG tablet Take 1 tablet (40 mg total) by mouth at bedtime.  30 tablet  3  . terazosin (HYTRIN) 5 MG capsule Take 1 capsule (5 mg total) by mouth at bedtime.  90 capsule  3   No current facility-administered medications on file prior to visit.    Assessment/Plan

## 2013-06-24 NOTE — Assessment & Plan Note (Signed)
Lab Results  Component Value Date   HGBA1C 7.1 05/20/2013   HGBA1C 7.6 02/11/2013   HGBA1C 7.5 08/20/2012     Assessment: Diabetes control: good control (HgbA1C at goal) Progress toward A1C goal:  at goal Comments: The patient's DM is well-controlled  Plan: Medications:  continue current medications Home glucose monitoring: Frequency: 2 times a day Timing: before meals Instruction/counseling given: reminded to bring blood glucose meter & log to each visit Educational resources provided:   Self management tools provided:   Other plans: Patient notes recent eye exam with Dr. Harriette Bouillon, will attempt to obtain records

## 2013-06-26 NOTE — Progress Notes (Signed)
Case discussed with Dr. Brown soon after the resident saw the patient.  We reviewed the resident's history and exam and pertinent patient test results.  I agree with the assessment, diagnosis, and plan of care documented in the resident's note. 

## 2013-07-03 ENCOUNTER — Ambulatory Visit: Payer: Medicare Other | Attending: Nurse Practitioner | Admitting: Rehabilitative and Restorative Service Providers"

## 2013-07-03 ENCOUNTER — Other Ambulatory Visit: Payer: Self-pay | Admitting: Nurse Practitioner

## 2013-07-03 DIAGNOSIS — M6281 Muscle weakness (generalized): Secondary | ICD-10-CM | POA: Insufficient documentation

## 2013-07-03 DIAGNOSIS — M25619 Stiffness of unspecified shoulder, not elsewhere classified: Secondary | ICD-10-CM | POA: Insufficient documentation

## 2013-07-03 DIAGNOSIS — IMO0001 Reserved for inherently not codable concepts without codable children: Secondary | ICD-10-CM | POA: Insufficient documentation

## 2013-07-03 DIAGNOSIS — Z8673 Personal history of transient ischemic attack (TIA), and cerebral infarction without residual deficits: Secondary | ICD-10-CM | POA: Insufficient documentation

## 2013-07-03 DIAGNOSIS — I639 Cerebral infarction, unspecified: Secondary | ICD-10-CM

## 2013-07-03 DIAGNOSIS — M25612 Stiffness of left shoulder, not elsewhere classified: Secondary | ICD-10-CM

## 2013-07-03 DIAGNOSIS — R4189 Other symptoms and signs involving cognitive functions and awareness: Secondary | ICD-10-CM | POA: Insufficient documentation

## 2013-07-08 ENCOUNTER — Ambulatory Visit: Payer: Medicare Other | Admitting: Occupational Therapy

## 2013-07-15 ENCOUNTER — Ambulatory Visit: Payer: Medicare Other | Attending: Nurse Practitioner | Admitting: Occupational Therapy

## 2013-07-15 DIAGNOSIS — Z8673 Personal history of transient ischemic attack (TIA), and cerebral infarction without residual deficits: Secondary | ICD-10-CM | POA: Insufficient documentation

## 2013-07-15 DIAGNOSIS — R4189 Other symptoms and signs involving cognitive functions and awareness: Secondary | ICD-10-CM | POA: Insufficient documentation

## 2013-07-15 DIAGNOSIS — M6281 Muscle weakness (generalized): Secondary | ICD-10-CM | POA: Insufficient documentation

## 2013-07-15 DIAGNOSIS — M25619 Stiffness of unspecified shoulder, not elsewhere classified: Secondary | ICD-10-CM | POA: Insufficient documentation

## 2013-07-15 DIAGNOSIS — IMO0001 Reserved for inherently not codable concepts without codable children: Secondary | ICD-10-CM | POA: Insufficient documentation

## 2013-07-16 ENCOUNTER — Telehealth: Payer: Self-pay | Admitting: Dietician

## 2013-07-16 NOTE — Telephone Encounter (Signed)
Per wife, patient saw Dr. Harriette Bouillon on 07-10-13 for his annual diabetes eye exam. Called Dr. Hans Eden office and requested report.

## 2013-07-22 ENCOUNTER — Ambulatory Visit: Payer: Medicare Other | Admitting: Occupational Therapy

## 2013-07-29 ENCOUNTER — Ambulatory Visit: Payer: Medicare Other | Admitting: Occupational Therapy

## 2013-08-05 ENCOUNTER — Ambulatory Visit: Payer: Medicare Other | Admitting: Occupational Therapy

## 2013-09-09 ENCOUNTER — Ambulatory Visit (INDEPENDENT_AMBULATORY_CARE_PROVIDER_SITE_OTHER): Payer: Medicare Other | Admitting: Internal Medicine

## 2013-09-09 ENCOUNTER — Encounter: Payer: Self-pay | Admitting: Internal Medicine

## 2013-09-09 VITALS — BP 185/83 | HR 67 | Temp 97.7°F | Ht 67.0 in | Wt 211.0 lb

## 2013-09-09 DIAGNOSIS — I10 Essential (primary) hypertension: Secondary | ICD-10-CM

## 2013-09-09 DIAGNOSIS — E785 Hyperlipidemia, unspecified: Secondary | ICD-10-CM

## 2013-09-09 DIAGNOSIS — Z01818 Encounter for other preprocedural examination: Secondary | ICD-10-CM | POA: Insufficient documentation

## 2013-09-09 DIAGNOSIS — E119 Type 2 diabetes mellitus without complications: Secondary | ICD-10-CM

## 2013-09-09 LAB — POCT GLYCOSYLATED HEMOGLOBIN (HGB A1C): Hemoglobin A1C: 7.1

## 2013-09-09 LAB — GLUCOSE, CAPILLARY: Glucose-Capillary: 188 mg/dL — ABNORMAL HIGH (ref 70–99)

## 2013-09-09 MED ORDER — CLOPIDOGREL BISULFATE 75 MG PO TABS: 75.0000 mg | ORAL_TABLET | Freq: Every day | ORAL | Status: DC

## 2013-09-09 MED ORDER — SIMVASTATIN 20 MG PO TABS: 20.0000 mg | ORAL_TABLET | Freq: Every day | ORAL | Status: DC

## 2013-09-09 MED ORDER — LISINOPRIL 40 MG PO TABS: 40.0000 mg | ORAL_TABLET | Freq: Every day | ORAL | Status: DC

## 2013-09-09 MED ORDER — AMLODIPINE BESYLATE 10 MG PO TABS: 10.0000 mg | ORAL_TABLET | Freq: Every day | ORAL | Status: DC

## 2013-09-09 MED ORDER — DONEPEZIL HCL 10 MG PO TABS: 10.0000 mg | ORAL_TABLET | Freq: Every day | ORAL | Status: DC

## 2013-09-09 NOTE — Patient Instructions (Signed)
General Instructions: The purpose of today's visit was to evaluate you prior to cataract surgery.  I will send your Ophthalmologist a copy of our exam today.  Your blood pressure is elevated today.  We are increasing your Lisinopril to 40 mg daily (double your prior dose).  Your diabetes is well-controlled today, with an A1C of 7.1.  Please keep your regularly scheduled visit on 12/10.   Treatment Goals:  Goals (1 Years of Data) as of 09/09/13         As of Today 06/24/13 06/17/13 05/20/13 04/16/13     Blood Pressure    . Blood Pressure < 140/90  185/83 138/67 137/59 153/74 124/59     Lifestyle    . Prevent Falls            Progress Toward Treatment Goals:  Treatment Goal 09/09/2013  Hemoglobin A1C at goal  Blood pressure deteriorated  Prevent falls -    Self Care Goals & Plans:  Self Care Goal 09/09/2013  Manage my medications take my medicines as prescribed; bring my medications to every visit; refill my medications on time  Monitor my health keep track of my blood glucose  Eat healthy foods eat foods that are low in salt; eat baked foods instead of fried foods; eat more vegetables  Be physically active find an activity I enjoy    Home Blood Glucose Monitoring 06/24/2013  Check my blood sugar 2 times a day  When to check my blood sugar before meals     Care Management & Community Referrals:  Referral 09/09/2013  Referrals made for care management support none needed

## 2013-09-09 NOTE — Assessment & Plan Note (Signed)
BP Readings from Last 3 Encounters:  09/09/13 185/83  06/24/13 138/67  06/17/13 137/59    Lab Results  Component Value Date   NA 142 03/15/2013   K 4.3 03/15/2013   CREATININE 1.16 03/15/2013    Assessment: Blood pressure control: moderately elevated Progress toward BP goal:  deteriorated Comments: BP elevated today, which is unusual for the patient.  Recheck was relatively unchanged.  Pt notes medication compliance.  Plan: Medications:  Increase lisinopril to 40 Educational resources provided:   Self management tools provided:   Other plans: Check BMET at next visit

## 2013-09-09 NOTE — Progress Notes (Signed)
HPI The patient is a 72 y.o. male with a history of DM2 on insulin, HTN, prior CVA, mild cognitive impairment, presenting for a pre-op exam for cataract surgery.  The patient is scheduled to undergo cataract surgery at [facility].  The patient notes no exertional chest pain.  He notes no orthopnea, PND.  He does note some dependent LE edema, which resolves with leg elevation.  The patient's wife notes that the patient is "not very active" at baseline.  He can dress, bathe, and feed himself.  He walks with the assistance of a walker.  He can walk up stairs when using the handrails, and claims he can walk up a hill (though the wife does not believe that he could walk up a hill).  He does note vacuuming one room of the house recently, which the wife confirms.  The patient's wife notes a fall 2 months ago, which involved slipping on a mat in the bathroom in the home.  BP is significantly elevated today.  The patient notes taking his BP pills this morning.  The patient has a history of DM.  His last A1C was 7.1.  ROS: General: no fevers, chills, changes in weight, changes in appetite Skin: no rash HEENT: no blurry vision, hearing changes, sore throat Pulm: no dyspnea, coughing, wheezing CV: no chest pain, palpitations, shortness of breath Abd: no abdominal pain, nausea/vomiting, diarrhea/constipation GU: no dysuria, hematuria, polyuria Ext: see HPI Neuro: see HPI  Filed Vitals:   09/09/13 0930  BP: 185/83  Pulse: 67  Temp: 97.7 F (36.5 C)    PEX General: alert, cooperative, and in no apparent distress HEENT: pupils equal round and reactive to light, vision grossly intact, oropharynx clear and non-erythematous  Neck: supple, no lymphadenopathy, no JVD Lungs: clear to ascultation bilaterally, normal work of respiration, no wheezes, rales, ronchi Heart: regular rate and rhythm, no murmurs, gallops, or rubs Abdomen: soft, non-tender, non-distended, normal bowel  sounds Extremities: no cyanosis, clubbing, or edema Neurologic: alert & oriented X3, cranial nerves II-XII intact, walks with a walker  Current Outpatient Prescriptions on File Prior to Visit  Medication Sig Dispense Refill  . Aluminum & Magnesium Hydroxide (LIQUID ANTACID PO) Take by mouth. 30cc PO q4h prn      . amLODipine (NORVASC) 10 MG tablet Take 1 tablet (10 mg total) by mouth daily.  90 tablet  3  . ARIPiprazole (ABILIFY PO) Take 1 tablet by mouth daily.      . carvedilol (COREG) 6.25 MG tablet Take 3 tablets (18.75 mg total) by mouth 2 (two) times daily with a meal.  180 tablet  6  . clopidogrel (PLAVIX) 75 MG tablet Take 1 tablet (75 mg total) by mouth daily.  30 tablet  11  . cyclobenzaprine (FLEXERIL) 5 MG tablet Take 1 tablet (5 mg total) by mouth 3 (three) times daily as needed.  90 tablet  6  . donepezil (ARICEPT) 10 MG tablet Take 1 tablet (10 mg total) by mouth at bedtime.  30 tablet  5  . eplerenone (INSPRA) 50 MG tablet Take 1 tablet (50 mg total) by mouth 2 (two) times daily.  60 tablet  6  . furosemide (LASIX) 80 MG tablet TAKE ONE TABLET BY MOUTH EVERY DAY  30 tablet  11  . gabapentin (NEURONTIN) 600 MG tablet Take 1 tablet (600 mg total) by mouth 3 (three) times daily.  90 tablet  5  . Hypromellose (NATURAL BALANCE TEARS) 0.4 % SOLN Apply  2 drops to eye 3 (three) times daily.      . insulin glargine (LANTUS) 100 UNIT/ML injection Inject 0.24 mLs (24 Units total) into the skin at bedtime.  15 mL  12  . lisinopril (PRINIVIL,ZESTRIL) 20 MG tablet TAKE ONE TABLET BY MOUTH EVERY DAY  30 tablet  11  . simvastatin (ZOCOR) 40 MG tablet Take 1 tablet (40 mg total) by mouth at bedtime.  30 tablet  3  . terazosin (HYTRIN) 5 MG capsule Take 1 capsule (5 mg total) by mouth at bedtime.  90 capsule  3   No current facility-administered medications on file prior to visit.    Assessment/Plan

## 2013-09-09 NOTE — Assessment & Plan Note (Signed)
Lab Results  Component Value Date   HGBA1C 7.1 09/09/2013   HGBA1C 7.1 05/20/2013   HGBA1C 7.6 02/11/2013     Assessment: Diabetes control: good control (HgbA1C at goal) Progress toward A1C goal:  at goal Comments: A1C remains stable at 7.1  Plan: Medications:  continue current medications Home glucose monitoring: Frequency:   Timing:   Instruction/counseling given: reminded to bring blood glucose meter & log to each visit Educational resources provided:   Self management tools provided:

## 2013-09-09 NOTE — Assessment & Plan Note (Addendum)
The patient is scheduled for upcoming cataract surgery, which is a low risk surgery.  His revised Goldman score is 2 (for CVA, DM w/ insulin), indicating a 2.4% risk for cardiac death, nonfatal MI, or nonfatal cardiac arrest.  He can perform likely 4-6 METS (walking up stairs, vacuuming).  I do not recommend a pre-op EKG, given the fact that he is undergoing low-risk surgery.  I do not recommend pre-op stress testing, as this has not been shown to improve outcomes, and the patient has no current symptoms of ischemic heart disease.  I do not recommend pre-op Echo, as patient's history and physical exam does not point towards heart failure or valvular dysfunction, and echo from 2010 showed EF 65%. -follow-up prior to cataract surgery to ensure BP has improved (see below)

## 2013-09-09 NOTE — Progress Notes (Signed)
Case discussed with Dr. Brown at the time of the visit.  We reviewed the resident's history and exam and pertinent patient test results.  I agree with the assessment, diagnosis, and plan of care documented in the resident's note. 

## 2013-09-16 ENCOUNTER — Ambulatory Visit (INDEPENDENT_AMBULATORY_CARE_PROVIDER_SITE_OTHER): Payer: Medicare Other | Admitting: Internal Medicine

## 2013-09-16 ENCOUNTER — Encounter: Payer: Self-pay | Admitting: Internal Medicine

## 2013-09-16 VITALS — BP 157/72 | HR 75 | Temp 99.3°F | Ht 67.0 in | Wt 210.4 lb

## 2013-09-16 DIAGNOSIS — I10 Essential (primary) hypertension: Secondary | ICD-10-CM

## 2013-09-16 LAB — BASIC METABOLIC PANEL
BUN: 19 mg/dL (ref 6–23)
Calcium: 9.7 mg/dL (ref 8.4–10.5)
Creat: 1.18 mg/dL (ref 0.50–1.35)
Glucose, Bld: 95 mg/dL (ref 70–99)
Potassium: 4.2 mEq/L (ref 3.5–5.3)

## 2013-09-16 MED ORDER — CARVEDILOL 25 MG PO TABS: 25.0000 mg | ORAL_TABLET | Freq: Two times a day (BID) | ORAL | Status: DC

## 2013-09-16 NOTE — Assessment & Plan Note (Addendum)
BP Readings from Last 3 Encounters:  09/16/13 157/72  09/09/13 185/83  06/24/13 138/67    Lab Results  Component Value Date   NA 142 03/15/2013   K 4.3 03/15/2013   CREATININE 1.16 03/15/2013    Assessment: Blood pressure control: mildly elevated Progress toward BP goal:  improved Comments: BP improved since last time, but still mildly elevated  Plan: Medications:  Increase coreg to 25 mg BID.  Continue lisinopril, amlodipine, lasix. Educational resources provided:   Self management tools provided:   Other plans: Check BMET today given increase in lisinopril dose at last visit.  I'd like to see him back in 1 week for a BP recheck.  If, however, he cannot make this appointment, this should not delay his scheduled cataract surgery.

## 2013-09-16 NOTE — Patient Instructions (Signed)
General Instructions: Your blood pressure has improved, but is still elevated.  We are increasing your dose of Coreg (Carvedilol).  Take 1 tablet, 25 mg, twice per day.  Please return for a blood pressure re-check sometime next week if you can.   Treatment Goals:  Goals (1 Years of Data) as of 09/16/13         As of Today 09/09/13 06/24/13 06/17/13 05/20/13     Blood Pressure    . Blood Pressure < 140/90  157/72 185/83 138/67 137/59 153/74     Lifestyle    . Prevent Falls            Progress Toward Treatment Goals:  Treatment Goal 09/16/2013  Hemoglobin A1C at goal  Blood pressure improved  Prevent falls -    Self Care Goals & Plans:  Self Care Goal 09/16/2013  Manage my medications take my medicines as prescribed; refill my medications on time  Monitor my health keep track of my blood glucose; bring my glucose meter and log to each visit  Eat healthy foods eat baked foods instead of fried foods; eat foods that are low in salt  Be physically active find an activity I enjoy    Home Blood Glucose Monitoring 09/16/2013  Check my blood sugar 2 times a day  When to check my blood sugar -     Care Management & Community Referrals:  Referral 09/16/2013  Referrals made for care management support none needed

## 2013-09-16 NOTE — Progress Notes (Signed)
HPI The patient is a 72 y.o. male with a history of DM2, prior CVA, OSA, presenting for a BP recheck.  The patient presents for a BP recheck.  His BP was elevated at his last visit, at which point we increased his lisinopril.  He notes compliance with his BP medications, including today.  SBP has decreased from the 180's to the 150's, but remains elevated on recheck.  The patient's wife reports home BP's in the 180's still.  ROS: General: no fevers, chills, changes in weight, changes in appetite Skin: no rash HEENT: no blurry vision, hearing changes, sore throat Pulm: no dyspnea, coughing, wheezing CV: no chest pain, palpitations, shortness of breath Abd: no abdominal pain, nausea/vomiting, diarrhea/constipation GU: no dysuria, hematuria, polyuria Ext: no arthralgias, myalgias Neuro: no weakness, numbness, or tingling  Filed Vitals:   09/16/13 1600  BP: 157/72  Pulse: 75  Temp: 99.3 F (37.4 C)    PEX General: alert, cooperative, and in no apparent distress HEENT: pupils equal round and reactive to light, vision grossly intact, oropharynx clear and non-erythematous  Neck: supple Lungs: clear to ascultation bilaterally, normal work of respiration, no wheezes, rales, ronchi Heart: regular rate and rhythm, no murmurs, gallops, or rubs Abdomen: soft, non-tender, non-distended, normal bowel sounds Extremities: no cyanosis, clubbing, or edema Neurologic: alert & oriented X3, cranial nerves II-XII intact, strength grossly intact, sensation intact to light touch  Current Outpatient Prescriptions on File Prior to Visit  Medication Sig Dispense Refill  . Aluminum & Magnesium Hydroxide (LIQUID ANTACID PO) Take by mouth. 30cc PO q4h prn      . amLODipine (NORVASC) 10 MG tablet Take 1 tablet (10 mg total) by mouth daily.  90 tablet  3  . ARIPiprazole (ABILIFY PO) Take 1 tablet by mouth daily.      . carvedilol (COREG) 6.25 MG tablet Take 3 tablets (18.75 mg total) by mouth 2 (two) times  daily with a meal.  180 tablet  6  . clopidogrel (PLAVIX) 75 MG tablet Take 1 tablet (75 mg total) by mouth daily.  30 tablet  11  . cyclobenzaprine (FLEXERIL) 5 MG tablet Take 1 tablet (5 mg total) by mouth 3 (three) times daily as needed.  90 tablet  6  . donepezil (ARICEPT) 10 MG tablet Take 1 tablet (10 mg total) by mouth at bedtime.  30 tablet  5  . eplerenone (INSPRA) 50 MG tablet Take 1 tablet (50 mg total) by mouth 2 (two) times daily.  60 tablet  6  . furosemide (LASIX) 80 MG tablet TAKE ONE TABLET BY MOUTH EVERY DAY  30 tablet  11  . gabapentin (NEURONTIN) 600 MG tablet Take 1 tablet (600 mg total) by mouth 3 (three) times daily.  90 tablet  5  . Hypromellose (NATURAL BALANCE TEARS) 0.4 % SOLN Apply 2 drops to eye 3 (three) times daily.      . insulin glargine (LANTUS) 100 UNIT/ML injection Inject 0.24 mLs (24 Units total) into the skin at bedtime.  15 mL  12  . lisinopril (PRINIVIL,ZESTRIL) 40 MG tablet Take 1 tablet (40 mg total) by mouth daily.  30 tablet  11  . simvastatin (ZOCOR) 20 MG tablet Take 1 tablet (20 mg total) by mouth at bedtime.  30 tablet  11  . terazosin (HYTRIN) 5 MG capsule Take 1 capsule (5 mg total) by mouth at bedtime.  90 capsule  3   No current facility-administered medications on file prior to visit.    Assessment/Plan

## 2013-09-17 ENCOUNTER — Encounter: Payer: Self-pay | Admitting: Nurse Practitioner

## 2013-09-17 ENCOUNTER — Ambulatory Visit (INDEPENDENT_AMBULATORY_CARE_PROVIDER_SITE_OTHER): Payer: Medicare Other | Admitting: Nurse Practitioner

## 2013-09-17 VITALS — BP 142/79 | HR 76 | Temp 97.5°F | Ht 66.5 in | Wt 210.0 lb

## 2013-09-17 DIAGNOSIS — R296 Repeated falls: Secondary | ICD-10-CM

## 2013-09-17 DIAGNOSIS — G4720 Circadian rhythm sleep disorder, unspecified type: Secondary | ICD-10-CM

## 2013-09-17 DIAGNOSIS — Z9181 History of falling: Secondary | ICD-10-CM

## 2013-09-17 DIAGNOSIS — G319 Degenerative disease of nervous system, unspecified: Secondary | ICD-10-CM

## 2013-09-17 NOTE — Progress Notes (Signed)
Case discussed with Dr. Brown at the time of the visit.  We reviewed the resident's history and exam and pertinent patient test results.  I agree with the assessment, diagnosis, and plan of care documented in the resident's note. 

## 2013-09-17 NOTE — Patient Instructions (Signed)
PLAN:  Continue Aricept for dementia. Continue clopidogrel 75 mg orally every day for secondary stroke prevention and maintain strict control of hypertension with blood pressure goal below 130/90, diabetes with hemoglobin A1c goal below 6.5% and lipids with LDL cholesterol goal below 100 mg/dL.   Check labs today, ammonia level and CMP.  Try to cut Gabapentin dose in half, 300 mg three times a day.  Try to cut Cyclobenzaprine dose in half, break tablet in half each dose. This is a muscle relaxant --  If not needed, do not take.  If Abilify is too expensive, please speak to MD that prescribed it, ask for cheaper alternative.  Follow up in 3 months. Must see Dr. Pearlean Brownie next time.

## 2013-09-17 NOTE — Progress Notes (Signed)
PATIENT: [name] DOB: Apr 17, 1941   REASON FOR VISIT: follow up HISTORY FROM: patient  HISTORY OF PRESENT ILLNESS: 72 year male with right hemispheric TIA in September 2011 and vascular risk factors of uncontrolled HT for many years, hyperlipidimia, diabetes and sleep apnea. History of right MCA aneurysm clipping in 2009.Mild cognitive impairment versus early dementia.   He returns for f/u after last visit on 09/23/2012.  He was admitted to [facility] on 03/10/13 with generalized weakness and deconditioning. MRI brain showed no acute abnormalities. He went for rehab to SNF and is improving with PT/OT and is now walking with a walker. He has noticed worsening of his memory and cognitive difficulties. He forgets recent information and needs more help with ADLs like shaving and combing hair. He has been on aricept for a while and is tolerating it well.   UPDATE 06/17/13 (LL): Patient returns for followup since last visit on 04/16/13. Dementia panel labs were checked after last visit, TSH, B12, RPR all normal. EEG showed non-specific slowing. Patient is accompanied by wife who states there has been no change in his memory since last visit, and he has completed PT. He ambulates slow but steady with walker. He has had no falls at home. Wife states that he sits in recliner most of the day, watches television and naps. MMSE 26/30  UPDATE 09/17/13 (LL):  Patient returns for followup since last visit on 06/17/13.  Patient is accompanied by wife who states there has been mild worsening in his memory since last visit. He ambulates slow but steady with walker. He has had 1 fall at home at night when he went to the bathroom. Wife states that he sits in recliner most of the day, watches television and naps. He is more awake during the night time hours.  He has visual and auditory hallucinations at night.  He does not wander.  ROS:  14 system review of systems is positive for fatigue, blurred vision, double  vision, loss of vision, swelling in legs, hearing loss, feeling cold, increased thirst, constipation,snoring, incontinence, joint swelling, joint pain, memory loss, confusion, numbness, weakness, slurred speech, depression, too much sleep, decreased energy, disinterest in activities, hallucinations, insomnia, snoring.  ALLERGIES: Allergies  Allergen Reactions  . Pioglitazone Other (See Comments)    Edema   . Rosiglitazone Maleate Other (See Comments)    edema    HOME MEDICATIONS: Outpatient Prescriptions Prior to Visit  Medication Sig Dispense Refill  . Aluminum & Magnesium Hydroxide (LIQUID ANTACID PO) Take by mouth. 30cc PO q4h prn      . amLODipine (NORVASC) 10 MG tablet Take 1 tablet (10 mg total) by mouth daily.  90 tablet  3  . ARIPiprazole (ABILIFY PO) Take 1 tablet by mouth daily.      . carvedilol (COREG) 25 MG tablet Take 1 tablet (25 mg total) by mouth 2 (two) times daily with a meal.  60 tablet  5  . clopidogrel (PLAVIX) 75 MG tablet Take 1 tablet (75 mg total) by mouth daily.  30 tablet  11  . cyclobenzaprine (FLEXERIL) 5 MG tablet Take 1 tablet (5 mg total) by mouth 3 (three) times daily as needed.  90 tablet  6  . donepezil (ARICEPT) 10 MG tablet Take 1 tablet (10 mg total) by mouth at bedtime.  30 tablet  5  . eplerenone (INSPRA) 50 MG tablet Take 1 tablet (50 mg total) by mouth 2 (two) times daily.  60 tablet  6  .  furosemide (LASIX) 80 MG tablet TAKE ONE TABLET BY MOUTH EVERY DAY  30 tablet  11  . gabapentin (NEURONTIN) 600 MG tablet Take 1 tablet (600 mg total) by mouth 3 (three) times daily.  90 tablet  5  . Hypromellose (NATURAL BALANCE TEARS) 0.4 % SOLN Apply 2 drops to eye 3 (three) times daily.      . insulin glargine (LANTUS) 100 UNIT/ML injection Inject 0.24 mLs (24 Units total) into the skin at bedtime.  15 mL  12  . lisinopril (PRINIVIL,ZESTRIL) 40 MG tablet Take 1 tablet (40 mg total) by mouth daily.  30 tablet  11  . simvastatin (ZOCOR) 20 MG tablet Take 1  tablet (20 mg total) by mouth at bedtime.  30 tablet  11  . terazosin (HYTRIN) 5 MG capsule Take 1 capsule (5 mg total) by mouth at bedtime.  90 capsule  3   . BESIVANCE 0.6 % SUSP    Sig: Place 0.6 drops into the left eye.  . DUREZOL 0.05 % EMUL    Sig: Place 0.05 drops into the left eye.  . ILEVRO 0.3 % SUSP    Sig: Place 0.3 drops into the left eye.  . risperiDONE (RISPERDAL) 0.5 MG tablet    Sig: Take 0.5 mg by mouth as needed.    PAST MEDICAL HISTORY: Past Medical History  Diagnosis Date  . Stroke     left thalamic infarct  . Diabetes mellitus   . Hypertension   . 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 TIA (transient ischemic attack)     multiple in past, most recently in 2011  . Sleep apnea   . CHF (congestive heart failure)   . Depression   . Hyperlipemia   . Arthritis     PAST SURGICAL HISTORY: Past Surgical History  Procedure Laterality Date  . Knee arthroscopy      lt knee  . Shoulder arthroscopy w/ rotator cuff repair      rt shoulder  . Intracranial aneurysm repair    . 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 Colon cancer Neg Hx   . Diabetes Sister   . Diabetes Brother   . Diabetes Paternal Aunt   . Stroke Mother   . Pneumonia Father     SOCIAL HISTORY: History   Social History  . Marital Status: Married    Spouse Name: [name]    Number of Children: 3  . Years of Education: HS   Occupational History  . disabled    Social History Main Topics  . Smoking status: Former Smoker -- 1.50 packs/day for 15 years    Types: Cigarettes    Quit date: 10/16/1983  . Smokeless tobacco: Never Used     Comment: Quit smoking 30 yrs ago   . Alcohol Use: No  . Drug Use: No  . Sexual Activity: Yes   Other Topics Concern  . Not on file   Social History Narrative   Pt lives at  home with spouse. Currently in rehab at [facility].   Caffeine Use: 1 cup daily     PHYSICAL EXAM  Filed Vitals:   09/17/13 1506  BP: 142/79  Pulse: 76  Temp: 97.5 F (36.4 C)  TempSrc: Oral  Height: 5' 6.5" (1.689 m)  Weight: 210 lb (95.255  kg)   Body mass index is 33.39 kg/(m^2).  Generalized: In no acute distress, seated elderly AA male  Neck: Supple, no carotid bruits  Cardiac: Regular rate rhythm, no murmur  Pulmonary: Clear to auscultation bilaterally  Musculoskeletal: right second finger partial amputation .left Frozen shoulder  Skin: no rash/petichiae. 1 + edema feet bilaterally.  Vascular: Normal pulses all extremities   Neurological examination  Mentation: Alert oriented to time, place, language fluent, SLOW TO RESPOND, FLAT AFFECT, SOLEMNENT (Last visit MMSE 26/30 with deficits in attention, comprehension and following commands.Animal Naming test scored 10 only. Clock drawing 2/4. Geriatric depression scale 4-not depressed.)  Cranial nerve II-XII: Pupils were equal round reactive to light extraocular movements were full, visual field were full on confrontational test. facial sensation and strength were normal. hearing was intact to finger rubbing bilaterally. Uvula tongue midline. head turning and shoulder shrug and were normal and symmetric. BILATERAL PTOSIS L>R, PARTIALLY RESTRICTING FIELD OF VISION.  MOTOR: normal bulk and tone, Normal strength in all tested extremity muscles.Limited shoulder abduction left more than right due to frozen shoulder. Mild diminished fine finger movements on left and orbits right over left upper extremity.  Sensory: intact to tough and pinprick and vibratory.  Coordination: Rapid alternating movements normal in all extremities. Finger-to-nose and heel-to-shin performed accurately bilaterally.  Gait and Station: Arises from chair without difficulty. Stance is broad based. Gait demonstrates normal stride length and balance . Not able to heel,  toe and tandem walk without difficulty. Using rolling walker.  Reflexes: 1+ and symmetric.   DIAGNOSTIC DATA (LABS, IMAGING, TESTING) - I reviewed patient records, labs, notes, testing and imaging myself where available.     Component Value Date/Time   NA 144 09/16/2013 1622   K 4.2 09/16/2013 1622   CL 105 09/16/2013 1622   CO2 30 09/16/2013 1622   GLUCOSE 95 09/16/2013 1622   BUN 19 09/16/2013 1622   CREATININE 1.18 09/16/2013 1622   CREATININE 1.16 03/15/2013 2045   CALCIUM 9.7 09/16/2013 1622   PROT 7.3 03/15/2013 2045   ALBUMIN 3.9 03/15/2013 2045   AST 21 03/15/2013 2045   ALT 19 03/15/2013 2045   ALKPHOS 67 03/15/2013 2045   BILITOT 0.6 03/15/2013 2045   GFRNONAA 62* 03/15/2013 2045   GFRAA 72* 03/15/2013 2045   Lab Results  Component Value Date   CHOL 111 03/25/2013   HDL 31* 03/25/2013   LDLCALC 40 03/25/2013   TRIG 202* 03/25/2013   CHOLHDL 3.6 03/25/2013   Lab Results  Component Value Date   HGBA1C 7.1 09/09/2013   Lab Results  Component Value Date   VITAMINB12 763 03/16/2013   Lab Results  Component Value Date   TSH 2.077 03/16/2013   RPR, TSH B12 all normal.  EEG 05/23/13  This is a moderately abnormal EEG due to the presence of moderate bihemispheric slowing which is a a nonspecific finding seen in a vareity of ischemic, Toxic, Metabolic, and degenerative etiologies.  MRI HEAD WO CONTRAST 03/16/13  No acute infarct.  MRI CERVICAL SPINE 03/16/13  Motion degraded exam. 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   ASSESSMENT AND PLAN 72 year male with right hemispheric TIA in September 2011 and vascular risk factors of HT, hyperlipidimia, diabetes and sleep apnea (non-compliant with cpap).  History of right MCA aneurysm clipping in 2009.  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 Sleep-wake disorder with  night time hallucinations.  He has excessive  daytime sleepiness, possible due to polypharmacy.  Continue Aricept for dementia - try taking in morning instead of at night due to sleep-wake disorder Continue clopidogrel 75 mg orally every day for secondary stroke prevention and maintain strict control of hypertension with blood pressure goal below 130/90, diabetes with hemoglobin A1c goal below 6.5% and lipids with LDL cholesterol goal below 100 mg/dL.  Check labs today, ammonia level and CMP. Try to cut Gabapentin dose in half, 300 mg three times a day. Try to cut Cyclobenzaprine dose in half, break tablet in half each dose. This is a muscle relaxant --  If not needed, do not take. If Abilify is too expensive, please speak to MD that prescribed it, ask for cheaper alternative. Follow up in 3 months.  Must see Dr. Pearlean Brownie next time. Total visit time 35 minutes, over half of this time was education and reviewing medications with spouse.  Orders Placed This Encounter  Procedures  . Comprehensive metabolic panel  . Ammonia   Tawny Asal Gypsy Kellogg, MSN, NP-C 09/17/2013, 3:56 PM [facility] [address], [address] [place], [place] [address] [phone]  Note: This document was prepared with digital dictation and possible smart phrase technology. Any transcriptional errors that result from this process are unintentional.

## 2013-09-18 LAB — COMPREHENSIVE METABOLIC PANEL
ALT: 19 IU/L (ref 0–44)
AST: 13 IU/L (ref 0–40)
Albumin/Globulin Ratio: 1.4 (ref 1.1–2.5)
Albumin: 4 g/dL (ref 3.5–4.8)
Alkaline Phosphatase: 78 IU/L (ref 39–117)
BUN/Creatinine Ratio: 14 (ref 10–22)
BUN: 17 mg/dL (ref 8–27)
CO2: 31 mmol/L — ABNORMAL HIGH (ref 18–29)
Calcium: 9.4 mg/dL (ref 8.6–10.2)
Chloride: 104 mmol/L (ref 97–108)
Creatinine, Ser: 1.2 mg/dL (ref 0.76–1.27)
GFR calc Af Amer: 70 mL/min/{1.73_m2} (ref >59)
GFR calc non Af Amer: 61 mL/min/{1.73_m2} (ref >59)
Globulin, Total: 2.8 g/dL (ref 1.5–4.5)
Glucose: 106 mg/dL — ABNORMAL HIGH (ref 65–99)
Potassium: 4.1 mmol/L (ref 3.5–5.2)
Sodium: 146 mmol/L — ABNORMAL HIGH (ref 134–144)
Total Bilirubin: 0.7 mg/dL (ref 0.0–1.2)
Total Protein: 6.8 g/dL (ref 6.0–8.5)

## 2013-09-18 LAB — AMMONIA: Ammonia: 56 ug/dL (ref 27–102)

## 2013-09-23 ENCOUNTER — Encounter: Payer: Self-pay | Admitting: Internal Medicine

## 2013-09-23 ENCOUNTER — Ambulatory Visit (INDEPENDENT_AMBULATORY_CARE_PROVIDER_SITE_OTHER): Payer: Medicare Other | Admitting: Internal Medicine

## 2013-09-23 VITALS — BP 128/60 | HR 59 | Temp 97.4°F | Ht 67.0 in | Wt 209.0 lb

## 2013-09-23 DIAGNOSIS — I10 Essential (primary) hypertension: Secondary | ICD-10-CM

## 2013-09-23 DIAGNOSIS — E119 Type 2 diabetes mellitus without complications: Secondary | ICD-10-CM

## 2013-09-23 LAB — BASIC METABOLIC PANEL WITH GFR
BUN: 18 mg/dL (ref 6–23)
CO2: 31 mEq/L (ref 19–32)
Chloride: 103 mEq/L (ref 96–112)
Glucose, Bld: 178 mg/dL — ABNORMAL HIGH (ref 70–99)
Potassium: 4 mEq/L (ref 3.5–5.3)
Sodium: 140 mEq/L (ref 135–145)

## 2013-09-23 LAB — GLUCOSE, CAPILLARY: Glucose-Capillary: 148 mg/dL — ABNORMAL HIGH (ref 70–99)

## 2013-09-23 NOTE — Progress Notes (Signed)
Patient: [name]   MRN: [id number]  DOB: Oct 19, 1940  PCP: Linward Headland, MD   Subjective:    CC: BP check   HPI:    The patient is a 72 y.o. Man with a PMH of T2DM, HTN, HLD, CHF, prior CVA, OSA, and depression presenting for a BP recheck.      Patient had uncontrolled HTN, and his Lisinopril and Coreg were increased over last couple of OVs since Nov. 2014. He has not checked his BP since last OV on 09/17/14. He is here for BP recheck.       He needs the good BP control for Cataract surgery. He is scheduled to have surgery on Monday 09/28/13.    Review of Systems: Review of Systems:  Constitutional:  Denies fever, chills, diaphoresis, appetite change and fatigue.   HEENT:  Denies congestion, sore throat, rhinorrhea, sneezing, mouth sores, trouble swallowing, neck pain   Respiratory:  Denies SOB, DOE, cough, and wheezing.   Cardiovascular:  Denies palpitations and leg swelling.   Gastrointestinal:  Denies nausea, vomiting, abdominal pain, diarrhea, constipation, blood in stool and abdominal distention.   Genitourinary:  Denies dysuria, urgency, frequency, hematuria, flank pain and difficulty urinating.   Musculoskeletal:  Denies myalgias, back pain, joint swelling, arthralgias and gait problem.   Skin:  Denies pallor, rash and wound.   Neurological:  Denies dizziness, seizures, syncope, weakness, light-headedness, numbness and headaches.    .     Current Outpatient Medications: Current Outpatient Prescriptions  Medication Sig Dispense Refill  . Aluminum & Magnesium Hydroxide (LIQUID ANTACID PO) Take by mouth. 30cc PO q4h prn      . amLODipine (NORVASC) 10 MG tablet Take 1 tablet (10 mg total) by mouth daily.  90 tablet  3  . ARIPiprazole (ABILIFY PO) Take 1 tablet by mouth daily.      Marland Kitchen BESIVANCE 0.6 % SUSP Place 0.6 drops into the left eye.      . carvedilol (COREG) 25 MG tablet Take 1 tablet (25 mg total) by mouth 2 (two) times daily with a meal.  60 tablet  5  .  clopidogrel (PLAVIX) 75 MG tablet Take 1 tablet (75 mg total) by mouth daily.  30 tablet  11  . cyclobenzaprine (FLEXERIL) 5 MG tablet Take 1 tablet (5 mg total) by mouth 3 (three) times daily as needed.  90 tablet  6  . donepezil (ARICEPT) 10 MG tablet Take 1 tablet (10 mg total) by mouth at bedtime.  30 tablet  5  . DUREZOL 0.05 % EMUL Place 0.05 drops into the left eye.      Marland Kitchen eplerenone (INSPRA) 50 MG tablet Take 1 tablet (50 mg total) by mouth 2 (two) times daily.  60 tablet  6  . furosemide (LASIX) 80 MG tablet TAKE ONE TABLET BY MOUTH EVERY DAY  30 tablet  11  . gabapentin (NEURONTIN) 600 MG tablet Take 1 tablet (600 mg total) by mouth 3 (three) times daily.  90 tablet  5  . Hypromellose (NATURAL BALANCE TEARS) 0.4 % SOLN Apply 2 drops to eye 3 (three) times daily.      . ILEVRO 0.3 % SUSP Place 0.3 drops into the left eye.      . insulin glargine (LANTUS) 100 UNIT/ML injection Inject 0.24 mLs (24 Units total) into the skin at bedtime.  15 mL  12  . lisinopril (PRINIVIL,ZESTRIL) 40 MG tablet Take 1 tablet (40 mg total) by mouth daily.  30 tablet  11  . risperiDONE (RISPERDAL) 0.5 MG tablet Take 0.5 mg by mouth as needed.      . simvastatin (ZOCOR) 20 MG tablet Take 1 tablet (20 mg total) by mouth at bedtime.  30 tablet  11  . terazosin (HYTRIN) 5 MG capsule Take 1 capsule (5 mg total) by mouth at bedtime.  90 capsule  3   No current facility-administered medications for this visit.    Allergies: Allergies  Allergen Reactions  . Pioglitazone Other (See Comments)    Edema   . Rosiglitazone Maleate Other (See Comments)    edema    Past Medical History  Diagnosis Date  . Stroke     left thalamic infarct  . Diabetes mellitus   . Hypertension   . 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 TIA (transient ischemic attack)     multiple in past, most recently in 2011  . Sleep  apnea   . CHF (congestive heart failure)   . Depression   . Hyperlipemia   . Arthritis     Objective:    Physical Exam: Filed Vitals:   09/23/13 1056 09/23/13 1117  BP: 142/60 128/60  Pulse: 59   Temp: 97.4 F (36.3 C)   TempSrc: Oral   Height: 5\' 7"  (1.702 m)   Weight: 209 lb (94.802 kg)   SpO2: 96%      General: Vital signs reviewed and noted. Well-developed, well-nourished, in no acute distress; alert, appropriate and cooperative throughout examination.  Head: Normocephalic, atraumatic.  Lungs:  Normal respiratory effort. Clear to auscultation BL without crackles or wheezes.  Heart: RRR. S1 and S2 normal without gallop, rubs, murmur.  Abdomen:  BS normoactive. Soft, Nondistended, non-tender.  No masses or organomegaly.  Extremities: 1-2+ pretibial edema.   Assessment/ Plan:

## 2013-09-23 NOTE — Patient Instructions (Signed)
1. Continue your current medications 2. Will check your BMP 3. Please follow up in 3 months.   Hypertension As your heart beats, it forces blood through your arteries. This force is your blood pressure. If the pressure is too high, it is called hypertension (HTN) or high blood pressure. HTN is dangerous because you may have it and not know it. High blood pressure may mean that your heart has to work harder to pump blood. Your arteries may be narrow or stiff. The extra work puts you at risk for heart disease, stroke, and other problems.  Blood pressure consists of two numbers, a higher number over a lower, 110/72, for example. It is stated as "110 over 72." The ideal is below 120 for the top number (systolic) and under 80 for the bottom (diastolic). Write down your blood pressure today. You should pay close attention to your blood pressure if you have certain conditions such as:  Heart failure.  Prior heart attack.  Diabetes  Chronic kidney disease.  Prior stroke.  Multiple risk factors for heart disease. To see if you have HTN, your blood pressure should be measured while you are seated with your arm held at the level of the heart. It should be measured at least twice. A one-time elevated blood pressure reading (especially in the Emergency Department) does not mean that you need treatment. There may be conditions in which the blood pressure is different between your right and left arms. It is important to see your caregiver soon for a recheck. Most people have essential hypertension which means that there is not a specific cause. This type of high blood pressure may be lowered by changing lifestyle factors such as:  Stress.  Smoking.  Lack of exercise.  Excessive weight.  Drug/tobacco/alcohol use.  Eating less salt. Most people do not have symptoms from high blood pressure until it has caused damage to the body. Effective treatment can often prevent, delay or reduce that  damage. TREATMENT  When a cause has been identified, treatment for high blood pressure is directed at the cause. There are a large number of medications to treat HTN. These fall into several categories, and your caregiver will help you select the medicines that are best for you. Medications may have side effects. You should review side effects with your caregiver. If your blood pressure stays high after you have made lifestyle changes or started on medicines,   Your medication(s) may need to be changed.  Other problems may need to be addressed.  Be certain you understand your prescriptions, and know how and when to take your medicine.  Be sure to follow up with your caregiver within the time frame advised (usually within two weeks) to have your blood pressure rechecked and to review your medications.  If you are taking more than one medicine to lower your blood pressure, make sure you know how and at what times they should be taken. Taking two medicines at the same time can result in blood pressure that is too low. SEEK IMMEDIATE MEDICAL CARE IF:  You develop a severe headache, blurred or changing vision, or confusion.  You have unusual weakness or numbness, or a faint feeling.  You have severe chest or abdominal pain, vomiting, or breathing problems. MAKE SURE YOU:   Understand these instructions.  Will watch your condition.  Will get help right away if you are not doing well or get worse. Document Released: 10/01/2005 Document Revised: 12/24/2011 Document Reviewed: 05/21/2008 [employer] Patient Information  2014 [employer].  

## 2013-09-23 NOTE — Assessment & Plan Note (Signed)
Lab Results  Component Value Date   HGBA1C 7.1 09/09/2013   HGBA1C 7.1 05/20/2013   HGBA1C 7.6 02/11/2013     Assessment: Diabetes control: good control (HgbA1C at goal) Progress toward A1C goal:  at goal Reports medical compliance  Plan: Medications:  continue current medications Home glucose monitoring: Frequency:   Timing:   Instruction/counseling given: reminded to get eye exam, reminded to bring blood glucose meter & log to each visit and reminded to bring medications to each visit Educational resources provided:   Self management tools provided:

## 2013-09-23 NOTE — Assessment & Plan Note (Signed)
BP Readings from Last 3 Encounters:  09/23/13 128/60  09/17/13 142/79  09/16/13 157/72    Lab Results  Component Value Date   [name] 146* 09/17/2013   K 4.1 09/17/2013   CREATININE 1.20 09/17/2013    Assessment: Blood pressure control: controlled Progress toward BP goal:  at goal Reports medical compliance.  Plan: Medications:  continue current medications Educational resources provided:   Self management tools provided:   Other plans: will check BMP since Lisinopril was increased two weeks ago.

## 2013-09-24 NOTE — Progress Notes (Signed)
Case discussed with Dr. Li at time of visit.  We reviewed the resident's history and exam and pertinent patient test results.  I agree with the assessment, diagnosis, and plan of care documented in the resident's note. 

## 2013-10-05 ENCOUNTER — Encounter: Payer: Self-pay | Admitting: Internal Medicine

## 2013-12-17 ENCOUNTER — Ambulatory Visit: Payer: Medicare Other | Admitting: Nurse Practitioner

## 2014-01-13 ENCOUNTER — Ambulatory Visit (INDEPENDENT_AMBULATORY_CARE_PROVIDER_SITE_OTHER): Payer: Commercial Managed Care - HMO | Admitting: Internal Medicine

## 2014-01-13 ENCOUNTER — Encounter: Payer: Self-pay | Admitting: Internal Medicine

## 2014-01-13 VITALS — BP 130/70 | HR 60

## 2014-01-13 DIAGNOSIS — I10 Essential (primary) hypertension: Secondary | ICD-10-CM

## 2014-01-13 DIAGNOSIS — E119 Type 2 diabetes mellitus without complications: Secondary | ICD-10-CM

## 2014-01-13 DIAGNOSIS — M940 Chondrocostal junction syndrome [Tietze]: Secondary | ICD-10-CM | POA: Insufficient documentation

## 2014-01-13 LAB — GLUCOSE, CAPILLARY: Glucose-Capillary: 147 mg/dL — ABNORMAL HIGH (ref 70–99)

## 2014-01-13 LAB — POCT GLYCOSYLATED HEMOGLOBIN (HGB A1C): HEMOGLOBIN A1C: 6.7

## 2014-01-13 MED ORDER — MELOXICAM 7.5 MG PO TABS: 7.5000 mg | ORAL_TABLET | Freq: Every day | ORAL | Status: DC

## 2014-01-13 NOTE — Progress Notes (Signed)
HPI The patient is a 73 y.o. male with a history of DM2, HTN, OSA, prior CVA, hyperaldosteronism, presenting for a follow-up visit for HTN.  The patient has a history of HTN, as well as hyperaldosteronism.  He takes eplerenone 50 mg twice per day, with the evening dose around 7pm, but is bothered by nocturia.  The patient notes that 2 days ago he was walking to the bathroom in the middle of the night, and tripped over something on the ground, but was able to catch himself before falling all the way to the ground.  BP today is 144/67 initially, but decreases to 130/70 on recheck.  The patient notes a 2-week history of pain with taking a deep breath, described as a sharp pain, right-sided, non-radiating, unchanged by exertion, rest, food, or changing position.  He notes no fever, cough, congestion, sore throat.  He notes no associated shortness of breath, nausea, vomiting, or diaphoresis.  The patient's A1C today is 6.7.  ROS: General: no fevers, chills, changes in weight, changes in appetite Skin: no rash HEENT: no blurry vision, hearing changes, sore throat Pulm: no dyspnea, coughing, wheezing CV: no palpitations, shortness of breath Abd: no abdominal pain, nausea/vomiting, diarrhea/constipation GU: no dysuria, hematuria, polyuria Ext: no arthralgias, myalgias Neuro: no weakness, numbness, or tingling  Filed Vitals:   01/13/14 1702  BP: 130/70  Pulse: 60    PEX General: alert, cooperative, and in no apparent distress HEENT: pupils equal round and reactive to light, vision grossly intact, oropharynx clear and non-erythematous  Neck: supple Lungs: clear to ascultation bilaterally, normal work of respiration, no wheezes, rales, ronchi Heart: regular rate and rhythm, no murmurs, gallops, or rubs.  Right chest wall mildly tender to palpation at right lower sternal border, which the patient notes is the same pain that was described with deep breathing Abdomen: soft, non-tender,  non-distended, normal bowel sounds Extremities: 1+ bilateral pitting edema Neurologic: alert & oriented X3, cranial nerves II-XII intact, strength grossly intact, sensation intact to light touch  Current Outpatient Prescriptions on File Prior to Visit  Medication Sig Dispense Refill  . Aluminum & Magnesium Hydroxide (LIQUID ANTACID PO) Take by mouth. 30cc PO q4h prn      . amLODipine (NORVASC) 10 MG tablet Take 1 tablet (10 mg total) by mouth daily.  90 tablet  3  . ARIPiprazole (ABILIFY PO) Take 1 tablet by mouth daily.      Marland Kitchen BESIVANCE 0.6 % SUSP Place 0.6 drops into the left eye.      . carvedilol (COREG) 25 MG tablet Take 1 tablet (25 mg total) by mouth 2 (two) times daily with a meal.  60 tablet  5  . clopidogrel (PLAVIX) 75 MG tablet Take 1 tablet (75 mg total) by mouth daily.  30 tablet  11  . cyclobenzaprine (FLEXERIL) 5 MG tablet Take 1 tablet (5 mg total) by mouth 3 (three) times daily as needed.  90 tablet  6  . donepezil (ARICEPT) 10 MG tablet Take 1 tablet (10 mg total) by mouth at bedtime.  30 tablet  5  . DUREZOL 0.05 % EMUL Place 0.05 drops into the left eye.      Marland Kitchen eplerenone (INSPRA) 50 MG tablet Take 1 tablet (50 mg total) by mouth 2 (two) times daily.  60 tablet  6  . furosemide (LASIX) 80 MG tablet TAKE ONE TABLET BY MOUTH EVERY DAY  30 tablet  11  . gabapentin (NEURONTIN) 600 MG tablet Take 1 tablet (600 mg  total) by mouth 3 (three) times daily.  90 tablet  5  . Hypromellose (NATURAL BALANCE TEARS) 0.4 % SOLN Apply 2 drops to eye 3 (three) times daily.      . ILEVRO 0.3 % SUSP Place 0.3 drops into the left eye.      . insulin glargine (LANTUS) 100 UNIT/ML injection Inject 0.24 mLs (24 Units total) into the skin at bedtime.  15 mL  12  . lisinopril (PRINIVIL,ZESTRIL) 40 MG tablet Take 1 tablet (40 mg total) by mouth daily.  30 tablet  11  . risperiDONE (RISPERDAL) 0.5 MG tablet Take 0.5 mg by mouth as needed.      . simvastatin (ZOCOR) 20 MG tablet Take 1 tablet (20 mg  total) by mouth at bedtime.  30 tablet  11  . terazosin (HYTRIN) 5 MG capsule Take 1 capsule (5 mg total) by mouth at bedtime.  90 capsule  3   No current facility-administered medications on file prior to visit.    Assessment/Plan

## 2014-01-13 NOTE — Assessment & Plan Note (Signed)
BP Readings from Last 3 Encounters:  01/13/14 130/70  09/23/13 128/60  09/17/13 142/79    Lab Results  Component Value Date   NA 140 09/23/2013   K 4.0 09/23/2013   CREATININE 1.13 09/23/2013    Assessment: Blood pressure control: controlled Progress toward BP goal:  at goal Comments: BP well-controlled on recheck.  Will change timing of eplerenone to 7am and 1pm to avoid nocturia  Plan: Medications:  continue current medications Educational resources provided:   Self management tools provided:   Other plans: Recheck at next visit

## 2014-01-13 NOTE — Patient Instructions (Signed)
General Instructions: For your diabetes, your A1C is well-controlled today!  Continue taking Lantus 24 units once per day.  For your blood pressure and urination at night, we are changing when you take your Inspra.  Take your first dose around 7:00 AM, and take your second dose around 1:00 PM.  We will fill out your [place] paperwork, and return it to you via mail.  Please return for a follow-up visit in 3 months.   Treatment Goals:  Goals (1 Years of Data) as of 01/13/14         09/23/13 09/23/13 09/17/13 09/16/13 09/09/13     Blood Pressure    . Blood Pressure < 140/90  128/60 142/60 142/79 157/72 185/83     Lifestyle    . Prevent Falls            Progress Toward Treatment Goals:  Treatment Goal 01/13/2014  Hemoglobin A1C at goal  Blood pressure at goal  Prevent falls -    Self Care Goals & Plans:  Self Care Goal 01/13/2014  Manage my medications take my medicines as prescribed; bring my medications to every visit; refill my medications on time  Monitor my health keep track of my blood glucose; bring my glucose meter and log to each visit  Eat healthy foods drink diet soda or water instead of juice or soda; eat foods that are low in salt; eat baked foods instead of fried foods  Be physically active find an activity I enjoy    Home Blood Glucose Monitoring 09/16/2013  Check my blood sugar 2 times a day  When to check my blood sugar -     Care Management & Community Referrals:  Referral 01/13/2014  Referrals made for care management support none needed

## 2014-01-13 NOTE — Assessment & Plan Note (Signed)
The patient notes right-sided chest pain at the sternal border, reproducible to palpation, likely representing costochondritis.  The patient notes no history of severe coughing, but has had falls in the past, most recently 2 days ago, which could have caused his msk chest pain. -treat with 1-week course of meloxicam, 7.5 mg daily (reduced dose and duration given CKD)

## 2014-01-13 NOTE — Assessment & Plan Note (Signed)
Lab Results  Component Value Date   HGBA1C 6.7 01/13/2014   HGBA1C 7.1 09/09/2013   HGBA1C 7.1 05/20/2013     Assessment: Diabetes control: good control (HgbA1C at goal) Progress toward A1C goal:  at goal Comments: Well-controlled  Plan: Medications:  Continue Lantus 24 units daily Home glucose monitoring: Frequency:   Timing:   Instruction/counseling given: discussed foot care Educational resources provided: handout Self management tools provided:   Other plans: Recheck at next visit

## 2014-01-14 NOTE — Progress Notes (Signed)
Case discussed with Dr. Brown at the time of the visit.  We reviewed the resident's history and exam and pertinent patient test results.  I agree with the assessment, diagnosis and plan of care documented in the resident's note. 

## 2014-02-16 ENCOUNTER — Ambulatory Visit (INDEPENDENT_AMBULATORY_CARE_PROVIDER_SITE_OTHER): Payer: Commercial Managed Care - HMO | Admitting: Neurology

## 2014-02-16 ENCOUNTER — Encounter: Payer: Self-pay | Admitting: Neurology

## 2014-02-16 ENCOUNTER — Encounter (INDEPENDENT_AMBULATORY_CARE_PROVIDER_SITE_OTHER): Payer: Self-pay

## 2014-02-16 VITALS — BP 170/60 | HR 57 | Ht 66.5 in | Wt 196.0 lb

## 2014-02-16 DIAGNOSIS — F0391 Unspecified dementia with behavioral disturbance: Secondary | ICD-10-CM

## 2014-02-16 DIAGNOSIS — F03918 Unspecified dementia, unspecified severity, with other behavioral disturbance: Secondary | ICD-10-CM

## 2014-02-16 MED ORDER — MEMANTINE HCL ER 7 & 14 & 21 &28 MG PO CP24: 7.0000 mg | ORAL_CAPSULE | ORAL | Status: DC

## 2014-02-16 NOTE — Progress Notes (Signed)
PATIENT: [name] DOB: 1941/05/23   REASON FOR VISIT: follow up HISTORY FROM: patient  HISTORY OF PRESENT ILLNESS: 73 year male with right hemispheric TIA in September 2011 and vascular risk factors of uncontrolled HT for many years, hyperlipidimia, diabetes and sleep apnea. History of right MCA aneurysm clipping in 2009.Mild cognitive impairment versus early dementia.   He returns for f/u after last visit on 09/23/2012.  He was admitted to [facility] on 03/10/13 with generalized weakness and deconditioning. MRI brain showed no acute abnormalities. He went for rehab to SNF and is improving with PT/OT and is now walking with a walker. He has noticed worsening of his memory and cognitive difficulties. He forgets recent information and needs more help with ADLs like shaving and combing hair. He has been on aricept for a while and is tolerating it well.   UPDATE 06/17/13 (LL): Patient returns for followup since last visit on 04/16/13. Dementia panel labs were checked after last visit, TSH, B12, RPR all normal. EEG showed non-specific slowing. Patient is accompanied by wife who states there has been no change in his memory since last visit, and he has completed PT. He ambulates slow but steady with walker. He has had no falls at home. Wife states that he sits in recliner most of the day, watches television and naps. MMSE 26/30  UPDATE 09/17/13 (LL):  Patient returns for followup since last visit on 06/17/13.  Patient is accompanied by wife who states there has been mild worsening in his memory since last visit. He ambulates slow but steady with walker. He has had 1 fall at home at night when he went to the bathroom. Wife states that he sits in recliner most of the day, watches television and naps. He is more awake during the night time hours.  He has visual and auditory hallucinations at night.  He does not wander. Update 02/16/14 ; he returns for followup today accompanied by his wife. She reports  continuing cognitive decline with memory difficulties as well as decrease overall functioning. His gait and balance also worse. He is now frequently incontinent. He uses a cane to ambulate. He is on Aricept 10 mg daily for a long time but seems to be slowly declining. His MMSE score today is 20/30 which is declined from 26/30 at last visit. He had labwork for reversible causes of cognitive impairment an EEG in 2014 which were unremarkable ROS:  14 system review of systems is positive for  constipation, apnea, snoring, leg swelling, hearing loss, incontinence of bladder, frequency of urination, urgency, weakness, memory loss, confusion, anxiety, walking difficulty, joint pain and swelling.  ALLERGIES: Allergies  Allergen Reactions  . Pioglitazone Other (See Comments)    Edema   . Rosiglitazone Maleate Other (See Comments)    edema    HOME MEDICATIONS: Outpatient Prescriptions Prior to Visit  Medication Sig Dispense Refill  . Aluminum & Magnesium Hydroxide (LIQUID ANTACID PO) Take by mouth. 30cc PO q4h prn      . amLODipine (NORVASC) 10 MG tablet Take 1 tablet (10 mg total) by mouth daily.  90 tablet  3  . ARIPiprazole (ABILIFY PO) Take 1 tablet by mouth daily.      . carvedilol (COREG) 25 MG tablet Take 1 tablet (25 mg total) by mouth 2 (two) times daily with a meal.  60 tablet  5  . clopidogrel (PLAVIX) 75 MG tablet Take 1 tablet (75 mg total) by mouth daily.  30 tablet  11  .  cyclobenzaprine (FLEXERIL) 5 MG tablet Take 1 tablet (5 mg total) by mouth 3 (three) times daily as needed.  90 tablet  6  . donepezil (ARICEPT) 10 MG tablet Take 1 tablet (10 mg total) by mouth at bedtime.  30 tablet  5  . eplerenone (INSPRA) 50 MG tablet Take 1 tablet (50 mg total) by mouth 2 (two) times daily.  60 tablet  6  . furosemide (LASIX) 80 MG tablet TAKE ONE TABLET BY MOUTH EVERY DAY  30 tablet  11  . gabapentin (NEURONTIN) 600 MG tablet Take 1 tablet (600 mg total) by mouth 3 (three) times daily.  90  tablet  5  . Hypromellose (NATURAL BALANCE TEARS) 0.4 % SOLN Apply 2 drops to eye 3 (three) times daily.      . insulin glargine (LANTUS) 100 UNIT/ML injection Inject 0.24 mLs (24 Units total) into the skin at bedtime.  15 mL  12  . lisinopril (PRINIVIL,ZESTRIL) 40 MG tablet Take 1 tablet (40 mg total) by mouth daily.  30 tablet  11  . simvastatin (ZOCOR) 20 MG tablet Take 1 tablet (20 mg total) by mouth at bedtime.  30 tablet  11  . terazosin (HYTRIN) 5 MG capsule Take 1 capsule (5 mg total) by mouth at bedtime.  90 capsule  3   . BESIVANCE 0.6 % SUSP    Sig: Place 0.6 drops into the left eye.  . DUREZOL 0.05 % EMUL    Sig: Place 0.05 drops into the left eye.  . ILEVRO 0.3 % SUSP    Sig: Place 0.3 drops into the left eye.  . risperiDONE (RISPERDAL) 0.5 MG tablet    Sig: Take 0.5 mg by mouth as needed.    PAST MEDICAL HISTORY: Past Medical History  Diagnosis Date  . Stroke     left thalamic infarct  . Diabetes mellitus   . Hypertension   . 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 TIA (transient ischemic attack)     multiple in past, most recently in 2011  . Sleep apnea   . CHF (congestive heart failure)   . Depression   . Hyperlipemia   . Arthritis     PAST SURGICAL HISTORY: Past Surgical History  Procedure Laterality Date  . Knee arthroscopy      lt knee  . Shoulder arthroscopy w/ rotator cuff repair      rt shoulder  . Intracranial aneurysm repair    . 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 Colon cancer Neg Hx   . Diabetes Sister   . Diabetes Brother   . Diabetes Paternal Aunt   . Stroke Mother   . Pneumonia Father     SOCIAL HISTORY: History   Social History  . Marital Status: Married    Spouse Name: [name]    Number of Children: 3  . Years of Education: HS   Occupational History   . disabled    Social History Main Topics  . Smoking status: Former Smoker -- 1.50 packs/day for 15 years    Types: Cigarettes    Quit date: 10/16/1983  . Smokeless tobacco: Never Used     Comment: Quit smoking 30 yrs ago   . Alcohol Use: No  . Drug Use: No  . Sexual Activity: Yes  Other Topics Concern  . Not on file   Social History Narrative   Pt lives at home with spouse. Currently in rehab at [facility].   Caffeine Use: 1 cup daily     PHYSICAL EXAM  Filed Vitals:   02/16/14 1530  BP: 170/60  Pulse: 57  Height: 5' 6.5" (1.689 m)  Weight: 196 lb (88.905 kg)   Body mass index is 31.16 kg/(m^2).  Generalized: In no acute distress, seated elderly AA male  Neck: Supple, no carotid bruits  Cardiac: Regular rate rhythm, no murmur  Pulmonary: Clear to auscultation bilaterally  Musculoskeletal: right second finger partial amputation .left Frozen shoulder  Skin: no rash/petichiae. 1 + edema feet bilaterally.  Vascular: Normal pulses all extremities   Neurological examination  Mentation: Alert oriented to time, place, language fluent, SLOW TO RESPOND, FLAT AFFECT,  . MMSE 20/30 today (Last visit MMSE 26/30 ) with deficits in attention, comprehension and following commands.Animal Naming test scored 6 only. Clock drawing 3/4. Geriatric depression scale 5-not depressed.)  Cranial nerve II-XII: Pupils were equal round reactive to light extraocular movements were full, visual field were full on confrontational test. facial sensation and strength were normal. hearing was intact to finger rubbing bilaterally. Uvula tongue midline. head turning and shoulder shrug and were normal and symmetric. BILATERAL PTOSIS L>R, PARTIALLY RESTRICTING FIELD OF VISION.  MOTOR: normal bulk and tone, Normal strength in all tested extremity muscles.Limited shoulder abduction left more than right due to frozen shoulder. Mild diminished fine finger movements on left and orbits right over left upper  extremity.  Sensory: intact to touch and pinprick and vibratory sensation.  Coordination: Rapid alternating movements normal in all extremities. Finger-to-nose and heel-to-shin performed accurately bilaterally.  Gait and Station: Arises from chair without difficulty. Stance is broad based. Gait demonstrates normal stride length and balance . Not able to heel, toe and tandem walk without difficulty. Using a cane  Reflexes: 1+ and symmetric.   DIAGNOSTIC DATA (LABS, IMAGING, TESTING) - I reviewed patient records, labs, notes, testing and imaging myself where available.     Component Value Date/Time   NA 140 09/23/2013 1133   NA 146* 09/17/2013 1603   K 4.0 09/23/2013 1133   CL 103 09/23/2013 1133   CO2 31 09/23/2013 1133   GLUCOSE 178* 09/23/2013 1133   GLUCOSE 106* 09/17/2013 1603   BUN 18 09/23/2013 1133   BUN 17 09/17/2013 1603   CREATININE 1.13 09/23/2013 1133   CREATININE 1.20 09/17/2013 1603   CALCIUM 9.6 09/23/2013 1133   PROT 6.8 09/17/2013 1603   PROT 7.3 03/15/2013 2045   ALBUMIN 3.9 03/15/2013 2045   AST 13 09/17/2013 1603   ALT 19 09/17/2013 1603   ALKPHOS 78 09/17/2013 1603   BILITOT 0.7 09/17/2013 1603   GFRNONAA 65 09/23/2013 1133   GFRNONAA 61 09/17/2013 1603   GFRAA 76 09/23/2013 1133   GFRAA 70 09/17/2013 1603   Lab Results  Component Value Date   CHOL 111 03/25/2013   HDL 31* 03/25/2013   LDLCALC 40 03/25/2013   TRIG 202* 03/25/2013   CHOLHDL 3.6 03/25/2013   Lab Results  Component Value Date   HGBA1C 6.7 01/13/2014   Lab Results  Component Value Date   WYOVZCHY85 027 03/16/2013   Lab Results  Component Value Date   TSH 2.077 03/16/2013   RPR, TSH B12 all normal.  EEG 05/23/13  This is a moderately abnormal EEG due to the presence of moderate bihemispheric slowing which is a a nonspecific  finding seen in a vareity of ischemic, Toxic, Metabolic, and degenerative etiologies.  MRI HEAD WO CONTRAST 03/16/13  No acute infarct.  MRI CERVICAL SPINE 03/16/13  Motion degraded  exam. 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   ASSESSMENT AND PLAN 73 year male with right hemispheric TIA in September 2011 and vascular risk factors of HT, hyperlipidimia, diabetes and sleep apnea (non-compliant with cpap).  History of right MCA aneurysm clipping in 2009.  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 Despite being on aricept I had a long discussion with the patient and his wife regarding his progressive dementia despite being on aspirin. I recommend trial of Namenda next titration pack and increase it as tolerated. Continue Aricept 10 mg daily. Check CT scan of the head, CMP, CBC and UA. Return for followup in 3 months Lynn,nurse. practitioner or call earlier if necessary  Continue clopidogrel 75 mg orally every day for secondary stroke prevention and maintain strict control of hypertension with blood pressure goal below 130/90, diabetes with hemoglobin A1c goal below 6.5% and lipids with LDL cholesterol goal below 100 mg/dL.     Orders Placed This Encounter  Procedures  . CT Head Wo Contrast  . CMP  . CBC with Differential  . Urinalysis with Reflex Microscopic   Antony Contras, MD  02/16/2014, 4:31 PM [facility] [address] [place], [place] [address] [phone]  Note: This document was prepared with digital dictation and possible smart phrase technology. Any transcriptional errors that result from this process are unintentional.

## 2014-02-16 NOTE — Patient Instructions (Signed)
I had a long discussion with the patient and his wife regarding his progressive dementia despite being on aspirin. I recommend trial of Namenda next titration pack and increase it as tolerated. Continue Aricept 10 mg daily. Check CT scan of the head, CMP, CBC and UA. Return for followup in 3 months Lynn,nurse. practitioner or call earlier if necessary

## 2014-02-17 ENCOUNTER — Other Ambulatory Visit (INDEPENDENT_AMBULATORY_CARE_PROVIDER_SITE_OTHER): Payer: Self-pay

## 2014-02-17 DIAGNOSIS — Z0289 Encounter for other administrative examinations: Secondary | ICD-10-CM

## 2014-02-18 LAB — CBC WITH DIFFERENTIAL/PLATELET
BASOS: 1 %
Basophils Absolute: 0 10*3/uL (ref 0.0–0.2)
EOS ABS: 0.1 10*3/uL (ref 0.0–0.4)
EOS: 2 %
HEMATOCRIT: 38.6 % (ref 37.5–51.0)
Hemoglobin: 11.8 g/dL — ABNORMAL LOW (ref 12.6–17.7)
Immature Grans (Abs): 0 10*3/uL (ref 0.0–0.1)
Immature Granulocytes: 0 %
LYMPHS ABS: 2.1 10*3/uL (ref 0.7–3.1)
Lymphs: 37 %
MCH: 24.8 pg — ABNORMAL LOW (ref 26.6–33.0)
MCHC: 30.6 g/dL — ABNORMAL LOW (ref 31.5–35.7)
MCV: 81 fL (ref 79–97)
MONOS ABS: 0.4 10*3/uL (ref 0.1–0.9)
Monocytes: 7 %
NEUTROS ABS: 3.1 10*3/uL (ref 1.4–7.0)
Neutrophils Relative %: 53 %
RBC: 4.76 x10E6/uL (ref 4.14–5.80)
RDW: 16.5 % — ABNORMAL HIGH (ref 12.3–15.4)
WBC: 5.8 10*3/uL (ref 3.4–10.8)

## 2014-02-18 LAB — COMPREHENSIVE METABOLIC PANEL
ALBUMIN: 4.1 g/dL (ref 3.5–4.8)
ALK PHOS: 66 IU/L (ref 39–117)
ALT: 14 IU/L (ref 0–44)
AST: 15 IU/L (ref 0–40)
Albumin/Globulin Ratio: 1.6 (ref 1.1–2.5)
BUN/Creatinine Ratio: 18 (ref 10–22)
BUN: 19 mg/dL (ref 8–27)
CO2: 27 mmol/L (ref 18–29)
CREATININE: 1.06 mg/dL (ref 0.76–1.27)
Calcium: 9.4 mg/dL (ref 8.6–10.2)
Chloride: 104 mmol/L (ref 97–108)
GFR calc non Af Amer: 71 mL/min/{1.73_m2} (ref >59)
GFR, EST AFRICAN AMERICAN: 82 mL/min/{1.73_m2} (ref >59)
Globulin, Total: 2.6 g/dL (ref 1.5–4.5)
Glucose: 126 mg/dL — ABNORMAL HIGH (ref 65–99)
Potassium: 3.9 mmol/L (ref 3.5–5.2)
Sodium: 145 mmol/L — ABNORMAL HIGH (ref 134–144)
Total Bilirubin: 0.7 mg/dL (ref 0.0–1.2)
Total Protein: 6.7 g/dL (ref 6.0–8.5)

## 2014-02-19 LAB — MICROSCOPIC EXAMINATION
EPITHELIAL CELLS (NON RENAL): NONE SEEN /HPF (ref 0–10)
WBC UA: NONE SEEN /HPF (ref 0–?)

## 2014-02-19 LAB — URINALYSIS, ROUTINE W REFLEX MICROSCOPIC
Bilirubin, UA: NEGATIVE
GLUCOSE, UA: NEGATIVE
KETONES UA: NEGATIVE
LEUKOCYTES UA: NEGATIVE
Nitrite, UA: NEGATIVE
RBC, UA: NEGATIVE
Specific Gravity, UA: >1.03 — ABNORMAL HIGH (ref 1.005–1.030)
Urobilinogen, Ur: 0.2 mg/dL (ref 0.0–1.9)
pH, UA: 6 (ref 5.0–7.5)

## 2014-02-26 ENCOUNTER — Ambulatory Visit
Admission: RE | Admit: 2014-02-26 | Discharge: 2014-02-26 | Disposition: A | Discharge status: Home / Self Care | Payer: Commercial Managed Care - HMO | Source: Ambulatory Visit | Attending: Neurology | Admitting: Neurology

## 2014-02-26 DIAGNOSIS — F03918 Unspecified dementia, unspecified severity, with other behavioral disturbance: Secondary | ICD-10-CM

## 2014-02-26 DIAGNOSIS — F0391 Unspecified dementia with behavioral disturbance: Secondary | ICD-10-CM

## 2014-02-26 DIAGNOSIS — F0390 Unspecified dementia without behavioral disturbance: Secondary | ICD-10-CM

## 2014-03-05 ENCOUNTER — Encounter: Payer: Self-pay | Admitting: Licensed Clinical Social Worker

## 2014-03-05 NOTE — Progress Notes (Signed)
Patient ID: [name], male   DOB: Apr 14, 1941, 73 y.o.   MRN: [id number] Per spouse request, Referral faxed to [facility] for Personal Care services.

## 2014-03-18 ENCOUNTER — Telehealth: Payer: Self-pay | Admitting: Nurse Practitioner

## 2014-03-18 NOTE — Telephone Encounter (Signed)
I called the pharmacy.  They are ordering the 7mg  caps and will dispense with titration pack instructions since titration pack is on backorder.  I called the patient back.  [name] is aware.

## 2014-03-18 NOTE — Telephone Encounter (Signed)
Spouse called and stated [facility] does not have medication for Rx Memantine HCl ER (NAMENDA XR TITRATION PACK) 7 & 14 & 21 &28 MG CP24.  Wanting to know if Dr Leonie Man could change medication.

## 2014-05-24 ENCOUNTER — Encounter: Payer: Self-pay | Admitting: Nurse Practitioner

## 2014-05-24 ENCOUNTER — Ambulatory Visit (INDEPENDENT_AMBULATORY_CARE_PROVIDER_SITE_OTHER): Payer: Commercial Managed Care - HMO | Admitting: Nurse Practitioner

## 2014-05-24 VITALS — BP 182/86 | HR 71 | Ht 65.25 in | Wt 183.8 lb

## 2014-05-24 DIAGNOSIS — F0391 Unspecified dementia with behavioral disturbance: Secondary | ICD-10-CM

## 2014-05-24 DIAGNOSIS — G4720 Circadian rhythm sleep disorder, unspecified type: Secondary | ICD-10-CM

## 2014-05-24 DIAGNOSIS — F03918 Unspecified dementia, unspecified severity, with other behavioral disturbance: Secondary | ICD-10-CM

## 2014-05-24 DIAGNOSIS — G319 Degenerative disease of nervous system, unspecified: Secondary | ICD-10-CM

## 2014-05-24 MED ORDER — MEMANTINE HCL ER 28 MG PO CP24: 1.0000 | ORAL_CAPSULE | Freq: Every day | ORAL | Status: DC

## 2014-05-24 MED ORDER — DONEPEZIL HCL 10 MG PO TABS: 10.0000 mg | ORAL_TABLET | Freq: Every day | ORAL | Status: DC

## 2014-05-24 MED ORDER — CLOPIDOGREL BISULFATE 75 MG PO TABS: 75.0000 mg | ORAL_TABLET | Freq: Every day | ORAL | Status: DC

## 2014-05-24 NOTE — Patient Instructions (Addendum)
Start Namenda XR Starter Pack, and when finished, there will be a prescription for Namenda XR 28 mg at the pharmacy. Take 1 capsule daily.  Continue Aricept 10 mg daily for memory loss as well.  Continue clopidogrel 75 mg orally every day for secondary stroke prevention and maintain strict control of hypertension with blood pressure goal below 140/90, diabetes with hemoglobin A1c goal below 7.0% and lipids with LDL cholesterol goal below 100 mg/dL.   Dementia Dementia is a general term for problems with brain function. A person with dementia has memory loss and a hard time with at least one other brain function such as thinking, speaking, or problem solving. Dementia can affect social functioning, how you do your job, your mood, or your personality. The changes may be hidden for a long time. The earliest forms of this disease are usually not detected by family or friends. Dementia can be:  Irreversible.  Potentially reversible.  Partially reversible.  Progressive. This means it can get worse over time. CAUSES  Irreversible dementia causes may include:  Degeneration of brain cells (Alzheimer disease or Lewy body dementia).  Multiple small strokes (vascular dementia).  Infection (chronic meningitis or Creutzfeldt-Jakob disease).  Frontotemporal dementia. This affects younger people, age 36 to 30, compared to those who have Alzheimer disease.  Dementia associated with other disorders like Parkinson disease, Huntington disease, or HIV-associated dementia. Potentially or partially reversible dementia causes may include:  Medicines.  Metabolic causes such as excessive alcohol intake, vitamin B12 deficiency, or thyroid disease.  Masses or pressure in the brain such as a tumor, blood clot, or hydrocephalus. SIGNS AND SYMPTOMS  Symptoms are often hard to detect. Family members or coworkers may not notice them early in the disease process. Different people with dementia may have different  symptoms. Symptoms can include:  A hard time with memory, especially recent memory. Long-term memory may not be impaired.  Asking the same question multiple times or forgetting something someone just said.  A hard time speaking your thoughts or finding certain words.  A hard time solving problems or performing familiar tasks (such as how to use a telephone).  Sudden changes in mood.  Changes in personality, especially increasing moodiness or mistrust.  Depression.  A hard time understanding complex ideas that were never a problem in the past. DIAGNOSIS  There are no specific tests for dementia.   Your health care provider may recommend a thorough evaluation. This is because some forms of dementia can be reversible. The evaluation will likely include a physical exam and getting a detailed history from you and a family member. The history often gives the best clues and suggestions for a diagnosis.  Memory testing may be done. A detailed brain function evaluation called neuropsychologic testing may be helpful.  Lab tests and brain imaging (such as a CT scan or MRI scan) are sometimes important.  Sometimes observation and re-evaluation over time is very helpful. TREATMENT  Treatment depends on the cause.   If the problem is a vitamin deficiency, it may be helped or cured with supplements.  For dementias such as Alzheimer disease, medicines are available to stabilize or slow the course of the disease. There are no cures for this type of dementia.  Your health care provider can help direct you to groups, organizations, and other health care providers to help with decisions in the care of you or your loved one. HOME CARE INSTRUCTIONS The care of individuals with dementia is varied and dependent upon the progression  of the dementia. The following suggestions are intended for the person living with, or caring for, the person with dementia.  Create a safe environment.  Remove the locks  on bathroom doors to prevent the person from accidentally locking himself or herself in.  Use childproof latches on kitchen cabinets and any place where cleaning supplies, chemicals, or alcohol are kept.  Use childproof covers in unused electrical outlets.  Install childproof devices to keep doors and windows secured.  Remove stove knobs or install safety knobs and an automatic shut-off on the stove.  Lower the temperature on water heaters.  Label medicines and keep them locked up.  Secure knives, lighters, matches, power tools, and guns, and keep these items out of reach.  Keep the house free from clutter. Remove rugs or anything that might contribute to a fall.  Remove objects that might break and hurt the person.  Make sure lighting is good, both inside and outside.  Install grab rails as needed.  Use a monitoring device to alert you to falls or other needs for help.  Reduce confusion.  Keep familiar objects and people around.  Use night lights or dim lights at night.  Label items or areas.  Use reminders, notes, or directions for daily activities or tasks.  Keep a simple, consistent routine for waking, meals, bathing, dressing, and bedtime.  Create a calm, quiet environment.  Place large clocks and calendars prominently.  Display emergency numbers and home address near all telephones.  Use cues to establish different times of the day. An example is to open curtains to let the natural light in during the day.   Use effective communication.  Choose simple words and short sentences.  Use a gentle, calm tone of voice.  Be careful not to interrupt.  If the person is struggling to find a word or communicate a thought, try to provide the word or thought.  Ask one question at a time. Allow the person ample time to answer questions. Repeat the question again if the person does not respond.  Reduce nighttime restlessness.  Provide a comfortable bed.  Have a  consistent nighttime routine.  Ensure a regular walking or physical activity schedule. Involve the person in daily activities as much as possible.  Limit napping during the day.  Limit caffeine.  Attend social events that stimulate rather than overwhelm the senses.  Encourage good nutrition and hydration.  Reduce distractions during meal times and snacks.  Avoid foods that are too hot or too cold.  Monitor chewing and swallowing ability.  Continue with routine vision, hearing, dental, and medical screenings.  Give medicines only as directed by the health care provider.  Monitor driving abilities. Do not allow the person to drive when safe driving is no longer possible.  Register with an identification program which could provide location assistance in the event of a missing person situation. SEEK MEDICAL CARE IF:   New behavioral problems start such as moodiness, aggressiveness, or seeing things that are not there (hallucinations).  Any new problem with brain function happens. This includes problems with balance, speech, or falling a lot.  Problems with swallowing develop.  Any symptoms of other illness happen. Small changes or worsening in any aspect of brain function can be a sign that the illness is getting worse. It can also be a sign of another medical illness such as infection. Seeing a health care provider right away is important. SEEK IMMEDIATE MEDICAL CARE IF:   A fever develops.  New or worsened confusion develops.  New or worsened sleepiness develops.  Staying awake becomes hard to do. Document Released: 03/27/2001 Document Revised: 02/15/2014 Document Reviewed: 02/26/2011 [facility] Patient Information 2015 [place], [place]. This information is not intended to replace advice given to you by your health care provider. Make sure you discuss any questions you have with your health care provider.

## 2014-05-24 NOTE — Progress Notes (Signed)
PATIENT: [name] DOB: 1941-10-08  REASON FOR VISIT: routine follow up for dementia HISTORY FROM: patient  HISTORY OF PRESENT ILLNESS: 73 year-old AA male with right hemispheric TIA in September 2011 and vascular risk factors of uncontrolled HT for many years, hyperlipidimia, diabetes and sleep apnea. History of right MCA aneurysm clipping in 2009.Mild cognitive impairment versus early dementia.   He returns for f/u after last visit on 09/23/2012. He was admitted to [facility] on 03/10/13 with generalized weakness and deconditioning. MRI brain showed no acute abnormalities. He went for rehab to SNF and is improving with PT/OT and is now walking with a walker. He has noticed worsening of his memory and cognitive difficulties. He forgets recent information and needs more help with ADLs like shaving and combing hair. He has been on aricept for a while and is tolerating it well.   UPDATE 06/17/13 (LL): Patient returns for followup since last visit on 04/16/13. Dementia panel labs were checked after last visit, TSH, B12, RPR all normal. EEG showed non-specific slowing. Patient is accompanied by wife who states there has been no change in his memory since last visit, and he has completed PT. He ambulates slow but steady with walker. He has had no falls at home. Wife states that he sits in recliner most of the day, watches television and naps. MMSE 26/30   UPDATE 09/17/13 (LL): Patient returns for followup since last visit on 06/17/13. Patient is accompanied by wife who states there has been mild worsening in his memory since last visit. He ambulates slow but steady with walker. He has had 1 fall at home at night when he went to the bathroom. Wife states that he sits in recliner most of the day, watches television and naps. He is more awake during the night time hours. He has visual and auditory hallucinations at night. He does not wander.   Update 02/16/14 (PS): He returns for followup today accompanied by  his wife. She reports continuing cognitive decline with memory difficulties as well as decrease overall functioning. His gait and balance also worse. He is now frequently incontinent. He uses a cane to ambulate. He is on Aricept 10 mg daily for a long time but seems to be slowly declining. His MMSE score today is 20/30 which is declined from 26/30 at last visit. He had labwork for reversible causes of cognitive impairment and EEG in 2014 which were unremarkable.  Update 05/24/14 (LL): He returns for followup today accompanied by his wife. At last visit CT head was ordered, was unremarkable. CBC, CMP, and UA were unremarkable as well. He was to be started on Namenda starter pack, but they could never get it from their pharmacy, it was backordered. His cognition continues to decline, now having problems with getting him to bathe or change clothing.  He is incontinent and often misses the toilet. Wife states he will just sit in a dark room all day, has no desire to go out of the house or engage in any activities.  He sleeps most of the day and then has problems sleeping at night.  He has visual and auditory hallucinations and is paranoid at times. Daughter is helping her see if he is eligible for any benefits through the [place].  Wife continues to struggle to care for him by herself. She has changed her cooking to be more healthy and controlling his portions, he has lost about 20 lbs.  ROS:  14 system review of systems is positive  for constipation, apnea, snoring, leg swelling, hearing loss, chills, fatigue, incontinence of bladder, frequency of urination, urgency, weakness, memory loss, confusion, anxiety, walking difficulty, joint pain and swelling.    ALLERGIES: Allergies  Allergen Reactions  . Pioglitazone Other (See Comments)    Edema   . Rosiglitazone Maleate Other (See Comments)    edema    HOME MEDICATIONS: Outpatient Prescriptions Prior to Visit  Medication Sig Dispense Refill  . Aluminum &  Magnesium Hydroxide (LIQUID ANTACID PO) Take by mouth. 30cc PO q4h prn      . amLODipine (NORVASC) 10 MG tablet Take 1 tablet (10 mg total) by mouth daily.  90 tablet  3  . ARIPiprazole (ABILIFY PO) Take 1 tablet by mouth daily.      . carvedilol (COREG) 25 MG tablet Take 1 tablet (25 mg total) by mouth 2 (two) times daily with a meal.  60 tablet  5  . eplerenone (INSPRA) 50 MG tablet Take 1 tablet (50 mg total) by mouth 2 (two) times daily.  60 tablet  6  . furosemide (LASIX) 80 MG tablet TAKE ONE TABLET BY MOUTH EVERY DAY  30 tablet  11  . gabapentin (NEURONTIN) 600 MG tablet Take 1 tablet (600 mg total) by mouth 3 (three) times daily.  90 tablet  5  . Hypromellose (NATURAL BALANCE TEARS) 0.4 % SOLN Apply 2 drops to eye 3 (three) times daily.      . insulin glargine (LANTUS) 100 UNIT/ML injection Inject 0.24 mLs (24 Units total) into the skin at bedtime.  15 mL  12  . lisinopril (PRINIVIL,ZESTRIL) 40 MG tablet Take 1 tablet (40 mg total) by mouth daily.  30 tablet  11  . meloxicam (MOBIC) 7.5 MG tablet Take 1 tablet (7.5 mg total) by mouth daily.  7 tablet  0  . risperiDONE (RISPERDAL) 0.5 MG tablet Take 0.5 mg by mouth as needed.      . simvastatin (ZOCOR) 20 MG tablet Take 1 tablet (20 mg total) by mouth at bedtime.  30 tablet  11  . terazosin (HYTRIN) 5 MG capsule Take 1 capsule (5 mg total) by mouth at bedtime.  90 capsule  3  . donepezil (ARICEPT) 10 MG tablet Take 1 tablet (10 mg total) by mouth at bedtime.  30 tablet  5  . Memantine HCl ER (NAMENDA XR TITRATION PACK) 7 & 14 & 21 &28 MG CP24 Take 7 mg by mouth 1 day or 1 dose.  60 capsule  1   No facility-administered medications prior to visit.   PHYSICAL EXAM Filed Vitals:   05/24/14 1443  BP: 182/86  Pulse: 71  Height: 5' 5.25" (1.657 m)  Weight: 183 lb 12.8 oz (83.371 kg)   Body mass index is 30.36 kg/(m^2).  Generalized: In no acute distress, seated elderly AA male  Neck: Supple, no carotid bruits  Cardiac: Regular rate  rhythm, no murmur  Pulmonary: Clear to auscultation bilaterally  Musculoskeletal: right second finger partial amputation, left Frozen shoulder  Skin: no rash/petichiae. 1 + edema feet bilaterally.  Vascular: Normal pulses all extremities   Neurological examination  Mentation: Alert oriented to time, place, language fluent, SLOW TO RESPOND, FLAT AFFECT, Last visit MMSE 20/30, previous visit MMSE 26/30 ) with deficits in attention, comprehension and following commands.Animal Naming test scored 6 only. Clock drawing 3/4. Geriatric depression scale 5-not depressed.)  Cranial nerve II-XII: Pupils were equal round reactive to light extraocular movements were full, visual field were full on confrontational test. facial  sensation and strength were normal. hearing was intact to finger rubbing bilaterally. Uvula tongue midline. head turning and shoulder shrug and were normal and symmetric. BILATERAL PTOSIS L>R, PARTIALLY RESTRICTING FIELD OF VISION.  MOTOR: normal bulk and tone, Normal strength in all tested extremity muscles.Limited shoulder abduction left more than right due to frozen shoulder. Mild diminished fine finger movements on left and orbits right over left upper extremity.  Sensory: intact to light touch on all 4 extremities. Coordination: Rapid alternating movements normal in all extremities. Finger-to-nose and heel-to-shin performed accurately bilaterally.  Gait and Station: Arises from chair without difficulty. Stance is broad based. Gait demonstrates normal stride length and balance . Not able to heel, toe and tandem walk without difficulty. Using a rolling walker. Reflexes: 1+ and symmetric.   CT HEAD 02/26/14  Abnormal CT scan of the head showing remote age bilateral basal ganglia infarcts and stable changes of right frontal craniotomy and right MCA aneurysm surgical clipping. No significant change compared with MRI scan dated 03/16/2013  ASSESSMENT: 73 year-old male with right hemispheric  TIA in September 2011 and vascular risk factors of HT, hyperlipidimia, diabetes and sleep apnea (non-compliant with cpap). History of right MCA aneurysm clipping in 2009.  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 Despite being on aricept.  PLAN: I had a long discussion with the patient and his wife regarding his progressive dementia despite being on aricept. Recommend trial of Namenda next titration pack and increase it as tolerated. Continue Aricept 10 mg daily. Titration starter pack given today.  Continue clopidogrel 75 mg orally every day for secondary stroke prevention and maintain strict control of hypertension with blood pressure goal below 130/90, diabetes with hemoglobin A1c goal below 6.5% and lipids with LDL cholesterol goal below 100 mg/dL. Return for followup in 6 months Jeani Hawking, nurse practitioner or call earlier if necessary.   Meds ordered this encounter  Medications  . Memantine HCl ER (NAMENDA XR) 28 MG CP24    Sig: Take 28 mg by mouth daily.    Dispense:  30 capsule    Refill:  5    Order Specific Question:  Supervising Provider    Answer:  Leonie Man, PRAMOD [[id number]]  . donepezil (ARICEPT) 10 MG tablet    Sig: Take 1 tablet (10 mg total) by mouth at bedtime.    Dispense:  30 tablet    Refill:  5    Order Specific Question:  Supervising Provider    Answer:  Leonie Man, PRAMOD [[id number]]  . clopidogrel (PLAVIX) 75 MG tablet    Sig: Take 1 tablet (75 mg total) by mouth daily.    Dispense:  30 tablet    Refill:  11    Order Specific Question:  Supervising Provider   Return in about 6 months (around 11/24/2014) for dementia, hx of stroke.  Henry Rummage Henry Christman, MSN, FNP-BC, A/GNP-C 05/24/2014, 4:55 PM [facility] [address] [place], [place] [address] [phone]  Note: This document was prepared with digital dictation and possible smart phrase technology. Any transcriptional errors that result from this process are unintentional.

## 2014-05-26 NOTE — Progress Notes (Signed)
I agree with the above plan 

## 2014-06-07 ENCOUNTER — Encounter: Payer: Self-pay | Admitting: Internal Medicine

## 2014-06-08 ENCOUNTER — Encounter: Payer: Self-pay | Admitting: Internal Medicine

## 2014-06-08 ENCOUNTER — Ambulatory Visit (INDEPENDENT_AMBULATORY_CARE_PROVIDER_SITE_OTHER): Payer: Commercial Managed Care - HMO | Admitting: Internal Medicine

## 2014-06-08 VITALS — Ht 65.0 in | Wt 198.8 lb

## 2014-06-08 DIAGNOSIS — I10 Essential (primary) hypertension: Secondary | ICD-10-CM

## 2014-06-08 DIAGNOSIS — I6350 Cerebral infarction due to unspecified occlusion or stenosis of unspecified cerebral artery: Secondary | ICD-10-CM

## 2014-06-08 DIAGNOSIS — Z0000 Encounter for general adult medical examination without abnormal findings: Secondary | ICD-10-CM

## 2014-06-08 DIAGNOSIS — Z23 Encounter for immunization: Secondary | ICD-10-CM

## 2014-06-08 DIAGNOSIS — E119 Type 2 diabetes mellitus without complications: Secondary | ICD-10-CM

## 2014-06-08 DIAGNOSIS — F0150 Vascular dementia without behavioral disturbance: Secondary | ICD-10-CM

## 2014-06-08 DIAGNOSIS — R32 Unspecified urinary incontinence: Secondary | ICD-10-CM

## 2014-06-08 DIAGNOSIS — Z9181 History of falling: Secondary | ICD-10-CM

## 2014-06-08 DIAGNOSIS — E785 Hyperlipidemia, unspecified: Secondary | ICD-10-CM

## 2014-06-08 DIAGNOSIS — I639 Cerebral infarction, unspecified: Secondary | ICD-10-CM

## 2014-06-08 DIAGNOSIS — R296 Repeated falls: Secondary | ICD-10-CM

## 2014-06-08 DIAGNOSIS — F0151 Vascular dementia with behavioral disturbance: Secondary | ICD-10-CM

## 2014-06-08 LAB — POCT GLYCOSYLATED HEMOGLOBIN (HGB A1C): Hemoglobin A1C: 6

## 2014-06-08 LAB — GLUCOSE, CAPILLARY: Glucose-Capillary: 102 mg/dL — ABNORMAL HIGH (ref 70–99)

## 2014-06-08 MED ORDER — CARVEDILOL 12.5 MG PO TABS: 12.5000 mg | ORAL_TABLET | Freq: Two times a day (BID) | ORAL | Status: DC

## 2014-06-08 NOTE — Assessment & Plan Note (Addendum)
-  referral to CSW for PT to help with gait (pt had recent falls lately and ambulates with cane occasionally and has a wheelchair that he uses on occasion) -d/c gabapentin

## 2014-06-08 NOTE — Assessment & Plan Note (Addendum)
Pt with dementia and unable to control urination.  Wife reports coming home and he has urinated on the floor.  Pt also with h/o BPH on terazosin which I discontinued due to possibly contributing to falls.  -reassess in 2-3 weeks

## 2014-06-08 NOTE — Assessment & Plan Note (Addendum)
Pt BP well-controlled today.  However, wife reports pt has not taken lasix in several months so I discontinued this.  Also due to increased falls, discontinued terazosin.  To further reduce pill burden and polypharmacy, I discontinued eplerenone for now.  Also decreased dose of carvedilol to 12.5mg  twice daily as his HR was also on the low side today. -continue amlodipine 10mg  daily, lisinopril 40mg  daily, and carvedilol 12.5mg  twice daily  -d/c mobic (may increase BP--wife reports not using this anyway) -use tylenol for pain if needed  -follow up in 2-3 weeks for a BP recheck (may need to add back eplerenone but would not add terazosin)

## 2014-06-08 NOTE — Assessment & Plan Note (Signed)
Pt has not used lantus in 3-4 months according to the wife.  HA1c 6.0 today.  Wife reports that she works ~8hrs/day and [name] with dementia on namenda and aricept is by himself.  She comes home and sometimes finds the water on and he has urinated on the floor.  Pt not able to perform ADLs of cooking, cleaning, toileting.  He is able to bathe himself somewhat but has difficulty due to weakness of the left side from his previous CVA.  The wife reports that she also has DMII and watches closely their diet and eats mainly vegetables and lean meats while limiting sweets.   -d/c lantus to decrease polypharmacy and medication burden  -return to clinic in 2-3 weeks for a recheck

## 2014-06-08 NOTE — Assessment & Plan Note (Signed)
-  recheck lipid panel based on guidelines -continue statin

## 2014-06-08 NOTE — Progress Notes (Signed)
Patient ID: [name], male   DOB: 08-08-1941, 73 y.o.   MRN: [id number]    Subjective:   Patient ID: [name] male    DOB: 1941/04/06 73 y.o.    MRN: [id number] Health Maintenance Due: Health Maintenance Due  Topic Date Due  . Zostavax  04/03/2003  . Lipid Panel  03/25/2014  . Hemoglobin A1c  04/14/2014  . Influenza Vaccine  05/15/2014    _________________________________________________  HPI: [name] is a 73 y.o. male here for a acute/routine visit.  Pt has a PMH outlined below.  Please see problem-based charting assessment and plan note for further details of medical issues addressed at today's visit.  PMH: Past Medical History  Diagnosis Date  . Stroke     left thalamic infarct  . Diabetes mellitus   . Hypertension   . 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 TIA (transient ischemic attack)     multiple in past, most recently in 2011  . Sleep apnea   . CHF (congestive heart failure)   . Depression   . Hyperlipemia   . Arthritis     Medications: Current Outpatient Prescriptions on File Prior to Visit  Medication Sig Dispense Refill  . ACCU-CHEK AVIVA PLUS test strip       . ACCU-CHEK SOFTCLIX LANCETS lancets       . Aluminum & Magnesium Hydroxide (LIQUID ANTACID PO) Take by mouth. 30cc PO q4h prn      . amLODipine (NORVASC) 10 MG tablet Take 1 tablet (10 mg total) by mouth daily.  90 tablet  3  . ARIPiprazole (ABILIFY PO) Take 1 tablet by mouth daily.      . carvedilol (COREG) 25 MG tablet Take 1 tablet (25 mg total) by mouth 2 (two) times daily with a meal.  60 tablet  5  . clopidogrel (PLAVIX) 75 MG tablet Take 1 tablet (75 mg total) by mouth daily.  30 tablet  11  . donepezil (ARICEPT) 10 MG tablet Take 1 tablet (10 mg total) by mouth at bedtime.  30 tablet  5  . eplerenone (INSPRA) 50 MG tablet Take 1 tablet (50 mg total) by  mouth 2 (two) times daily.  60 tablet  6  . furosemide (LASIX) 80 MG tablet TAKE ONE TABLET BY MOUTH EVERY DAY  30 tablet  11  . gabapentin (NEURONTIN) 600 MG tablet Take 1 tablet (600 mg total) by mouth 3 (three) times daily.  90 tablet  5  . Hypromellose (NATURAL BALANCE TEARS) 0.4 % SOLN Apply 2 drops to eye 3 (three) times daily.      . insulin glargine (LANTUS) 100 UNIT/ML injection Inject 0.24 mLs (24 Units total) into the skin at bedtime.  15 mL  12  . lisinopril (PRINIVIL,ZESTRIL) 40 MG tablet Take 1 tablet (40 mg total) by mouth daily.  30 tablet  11  . meloxicam (MOBIC) 7.5 MG tablet Take 1 tablet (7.5 mg total) by mouth daily.  7 tablet  0  . Memantine HCl ER (NAMENDA XR) 28 MG CP24 Take 28 mg by mouth daily.  30 capsule  5  . risperiDONE (RISPERDAL) 0.5 MG tablet Take 0.5 mg by mouth as needed.      . simvastatin (ZOCOR) 20 MG tablet Take 1 tablet (20 mg total) by mouth at bedtime.  30 tablet  11  . terazosin (HYTRIN) 5  MG capsule Take 1 capsule (5 mg total) by mouth at bedtime.  90 capsule  3   No current facility-administered medications on file prior to visit.    Allergies: Allergies  Allergen Reactions  . Pioglitazone Other (See Comments)    Edema   . Rosiglitazone Maleate Other (See Comments)    edema    FH: Family History  Problem Relation Age of Onset  . Colon cancer Neg Hx   . Diabetes Sister   . Diabetes Brother   . Diabetes Paternal Aunt   . Stroke Mother   . Pneumonia Father     SH: History   Social History  . Marital Status: Married    Spouse Name: [name]    Number of Children: 3  . Years of Education: HS   Occupational History  . disabled    Social History Main Topics  . Smoking status: Former Smoker -- 1.50 packs/day for 15 years    Types: Cigarettes    Quit date: 10/16/1983  . Smokeless tobacco: Never Used     Comment: Quit smoking 30 yrs ago   . Alcohol Use: No  . Drug Use: No  . Sexual Activity: Yes   Other Topics Concern  . Not  on file   Social History Narrative   Pt lives at home with spouse. Currently in rehab at [facility].   Caffeine Use: 1 cup daily    Review of Systems: Constitutional: Negative for fever, chills and weight loss.  Eyes: Negative for blurred vision.  Respiratory: Negative for cough and shortness of breath.  Cardiovascular: Negative for chest pain, palpitations and leg swelling.  Gastrointestinal: Negative for nausea, vomiting, abdominal pain, diarrhea, constipation and blood in stool.  Genitourinary: Negative for dysuria, urgency and frequency.  Musculoskeletal: Negative for myalgias and back pain.  Neurological: Negative for dizziness, weakness and headaches.     Objective:   Vital Signs: There were no vitals filed for this visit.    BP Readings from Last 3 Encounters:  05/24/14 182/86  02/16/14 170/60  01/13/14 130/70    Physical Exam: Constitutional: Vital signs reviewed.  Patient is well-developed and well-nourished in NAD and cooperative with exam.  Head: Normocephalic and atraumatic. Eyes: PERRL, EOMI, conjunctivae nl, no scleral icterus.  Neck: Supple. Cardiovascular: RRR, no MRG. Pulmonary/Chest: normal effort, non-tender to palpation, CTAB, no wheezes, rales, or rhonchi. Abdominal: Soft. NT/ND +BS. Musculoskeletal: Full range ofmotion. no pain,edema,or deformity.  Nocyanosis,clubbing,oredema. Neurological: A&O x3, cranial nerves II-XII are grossly intact, moving all extremities. Extremities: 2+DP b/l; no pitting edema. Skin: Warm, dry and intact. No rash.  Most Recent Laboratory Results:  CMP     Component Value Date/Time   NA 145* 02/17/2014 1524   NA 140 09/23/2013 1133   K 3.9 02/17/2014 1524   CL 104 02/17/2014 1524   CO2 27 02/17/2014 1524   GLUCOSE 126* 02/17/2014 1524   GLUCOSE 178* 09/23/2013 1133   BUN 19 02/17/2014 1524   BUN 18 09/23/2013 1133   CREATININE 1.06 02/17/2014 1524   CREATININE 1.13 09/23/2013 1133   CALCIUM 9.4 02/17/2014 1524   PROT  6.7 02/17/2014 1524   PROT 7.3 03/15/2013 2045   ALBUMIN 3.9 03/15/2013 2045   AST 15 02/17/2014 1524   ALT 14 02/17/2014 1524   ALKPHOS 66 02/17/2014 1524   BILITOT 0.7 02/17/2014 1524   GFRNONAA 71 02/17/2014 1524   GFRNONAA 65 09/23/2013 1133   GFRAA 82 02/17/2014 1524   GFRAA 76 09/23/2013 1133  CBC    Component Value Date/Time   WBC 5.8 02/17/2014 1524   WBC 6.8 03/15/2013 2002   RBC 4.76 02/17/2014 1524   RBC 4.72 03/15/2013 2002   RBC 4.76 05/05/2012 1017   HGB 11.8* 02/17/2014 1524   HCT 38.6 02/17/2014 1524   PLT 187 03/15/2013 2002   MCV 81 02/17/2014 1524   MCH 24.8* 02/17/2014 1524   MCH 25.8* 03/15/2013 2002   MCHC 30.6* 02/17/2014 1524   MCHC 31.5 03/15/2013 2002   RDW 16.5* 02/17/2014 1524   RDW 15.3 03/15/2013 2002   LYMPHSABS 2.1 02/17/2014 1524   LYMPHSABS 1.4 03/15/2013 2002   MONOABS 0.4 03/15/2013 2002   EOSABS 0.1 02/17/2014 1524   EOSABS 0.1 03/15/2013 2002   BASOSABS 0.0 02/17/2014 1524   BASOSABS 0.0 03/15/2013 2002    Lipid Panel Lab Results  Component Value Date   CHOL 111 03/25/2013   HDL 31* 03/25/2013   LDLCALC 40 03/25/2013   TRIG 202* 03/25/2013   CHOLHDL 3.6 03/25/2013    HA1C Lab Results  Component Value Date   HGBA1C 6.7 01/13/2014    Urinalysis    Component Value Date/Time   COLORURINE YELLOW 03/15/2013 2153   APPEARANCEUR CLOUDY* 03/15/2013 2153   LABSPEC 1.024 03/15/2013 2153   PHURINE 5.5 03/15/2013 2153   GLUCOSEU Negative 02/19/2014 0745   GLUCOSEU NEG mg/dL 01/14/2007 2016   HGBUR TRACE* 03/15/2013 2153   BILIRUBINUR Negative 02/19/2014 0745   BILIRUBINUR NEGATIVE 03/15/2013 2153   KETONESUR 15* 03/15/2013 2153   PROTEINUR >300* 03/15/2013 2153   UROBILINOGEN 1.0 03/15/2013 2153   NITRITE Negative 02/19/2014 0745   NITRITE NEGATIVE 03/15/2013 2153   LEUKOCYTESUR Negative 02/19/2014 0745   LEUKOCYTESUR NEGATIVE 03/15/2013 2153    Urine Microalbumin Lab Results  Component Value Date   MICROALBUR 0.50 08/10/2009    Imaging N/A   Assessment & Plan:   Assessment and plan was discussed and  formulated with my attending.

## 2014-06-08 NOTE — Patient Instructions (Signed)
Thank you for your visit today.   Please return to the internal medicine clinic in 2-3 weeks to recheck your blood pressure.       I have made the following additions/changes to your medications:  Decreased coreg to 12.5mg  twice daily. Stop terazosin.  Stop furosemide. Stop abilify. Stop eplerenone. Stop gabapentin.  Stop meloxicam.  Stop risperdal.  Stop lantus.   Continue all other medications.   I would try melatonin at bedtime for sleep.  You may purchase this at your local drug store.   Please be sure to bring all of your medications with you to every visit; this includes herbal supplements, vitamins, eye drops, and any over-the-counter medications.   Should you have any questions regarding your medications and/or any new or worsening symptoms, please be sure to call the clinic at [phone].   If you believe that you are suffering from a life threatening condition or one that may result in the loss of limb or function, then you should call 911 or proceed to the nearest Emergency Department.     A healthy lifestyle and preventative care can promote health and wellness.   Maintain regular health, dental, and eye exams.  Eat a healthy diet. Foods like vegetables, fruits, whole grains, low-fat dairy products, and lean protein foods contain the nutrients you need without too many calories. Decrease your intake of foods high in solid fats, added sugars, and salt. Get information about a proper diet from your caregiver, if necessary.  Regular physical exercise is one of the most important things you can do for your health. Most adults should get at least 150 minutes of moderate-intensity exercise (any activity that increases your heart rate and causes you to sweat) each week. In addition, most adults need muscle-strengthening exercises on 2 or more days a week.   Maintain a healthy weight. The body mass index (BMI) is a screening tool to identify possible weight problems. It  provides an estimate of body fat based on height and weight. Your caregiver can help determine your BMI, and can help you achieve or maintain a healthy weight. For adults 20 years and older:  A BMI below 18.5 is considered underweight.  A BMI of 18.5 to 24.9 is normal.  A BMI of 25 to 29.9 is considered overweight.  A BMI of 30 and above is considered obese.

## 2014-06-08 NOTE — Assessment & Plan Note (Signed)
-  continue plavix and statin

## 2014-06-08 NOTE — Assessment & Plan Note (Addendum)
-  referral to CSW for assistance and possible respite (pt cares for husband alone) -flu vaccine given  -needs prevnar

## 2014-06-08 NOTE — Addendum Note (Signed)
Addended by: Jones Bales on: 06/08/2014 10:50 PM   Modules accepted: Level of Service

## 2014-06-08 NOTE — Assessment & Plan Note (Addendum)
Follows with neurology recently had a decline in memory, mainly short term.  Is unable to perform ADLs except bathing that he struggles with due to left sided residual weakness from previous CVA.  Last OV, neurology started namenda in addition to aricept already on.  TSH, B12, RPR, previously checked and were wnl.  Dementia likely to do CVA but also would consider NPH however, no evidence of hydrocephalus on CT/MRI.  Also could be Alzheimer's.   -would consider checking B1 at next visit  -continue namenda and aricept -d/c risperdal as this has a black box warning that may actually increase the risk of strokes in the elderly  -may require an agent for depression but would try lexapro

## 2014-06-09 LAB — LIPID PANEL
Cholesterol: 102 mg/dL (ref 0–200)
HDL: 47 mg/dL (ref >39)
LDL CALC: 46 mg/dL (ref 0–99)
Total CHOL/HDL Ratio: 2.2 Ratio
Triglycerides: 47 mg/dL (ref <150)
VLDL: 9 mg/dL (ref 0–40)

## 2014-06-09 NOTE — Progress Notes (Signed)
Case discussed with Dr. Gordy Levan soon after the resident saw the patient.  We reviewed the resident's history and exam and pertinent patient test results.  I agree with the assessment, diagnosis and plan of care documented in the resident's note.  If BPH continues to be a problem off of terazosin could consider finasteride therapy if his PSA is normal for age.  This can take time to achieve maximal symptomatic effectiveness, but could eventually address the BOO symptoms without a significant impact on blood pressure and falls.

## 2014-07-08 ENCOUNTER — Encounter: Payer: Self-pay | Admitting: Licensed Clinical Social Worker

## 2014-07-08 ENCOUNTER — Ambulatory Visit (INDEPENDENT_AMBULATORY_CARE_PROVIDER_SITE_OTHER): Payer: Commercial Managed Care - HMO | Admitting: Internal Medicine

## 2014-07-08 ENCOUNTER — Encounter: Payer: Self-pay | Admitting: Internal Medicine

## 2014-07-08 VITALS — BP 176/52 | HR 71 | Temp 99.5°F | Ht 65.0 in | Wt 190.0 lb

## 2014-07-08 DIAGNOSIS — I10 Essential (primary) hypertension: Secondary | ICD-10-CM

## 2014-07-08 DIAGNOSIS — F0150 Vascular dementia without behavioral disturbance: Secondary | ICD-10-CM

## 2014-07-08 DIAGNOSIS — E119 Type 2 diabetes mellitus without complications: Secondary | ICD-10-CM

## 2014-07-08 MED ORDER — CARVEDILOL 25 MG PO TABS: 25.0000 mg | ORAL_TABLET | Freq: Two times a day (BID) | ORAL | Status: DC

## 2014-07-08 MED ORDER — EPLERENONE 25 MG PO TABS: 25.0000 mg | ORAL_TABLET | Freq: Two times a day (BID) | ORAL | Status: DC

## 2014-07-08 NOTE — Assessment & Plan Note (Signed)
BP Readings from Last 3 Encounters:  07/08/14 176/52  05/24/14 182/86  02/16/14 170/60    Lab Results  Component Value Date   NA 145* 02/17/2014   K 3.9 02/17/2014   CREATININE 1.06 02/17/2014    Assessment: Blood pressure control:  moderately elevated Progress toward BP goal:   not at goal Comments: Pt's medications were changed on last visit  Plan: Medications:  Will increase coreg back to 25 mg bid and start inspra at 25 mg 2 *day Educational resources provided:   Self management tools provided:   Other plans: Will recheck BMP and BP in 2 weeks

## 2014-07-08 NOTE — Assessment & Plan Note (Signed)
-   Pt with worsening gait and memory impairment - Will get home health care and PT to evaluate patient - c/w namenda and aricept. Outpatient neuro follow up - Pt had a discussion with [facility] CSW. Will make decision about long term placement

## 2014-07-08 NOTE — Patient Instructions (Signed)
-   Your BP is high today. Will increase the coreg to 25 mg 2 *day - Will also restart inspra at 25 mg twice a day - Please follow up in 3 months for BP recheck - I will get [facility]ana CSW to speak with you about options for possible long term placement - Outpatient follow up with neurology

## 2014-07-08 NOTE — Assessment & Plan Note (Signed)
Lab Results  Component Value Date   HGBA1C 6.0 06/08/2014   HGBA1C 6.7 01/13/2014   HGBA1C 7.1 09/09/2013     Assessment: Diabetes control:  well controlled Progress toward A1C goal:   at goal Comments: pt now off medication  Plan: Medications:  Pt was taken off meds on last visit Home glucose monitoring: Frequency:   Timing:   Instruction/counseling given: discussed diet Educational resources provided:   Self management tools provided:   Other plans: Pt to get A1C in 3 months. Would c/w diet control alone for now

## 2014-07-08 NOTE — Progress Notes (Addendum)
CSW met with [name] and spouse.  Spouse is known to this worker, as pt has been in need of personal care services but lack payor source.  Spouse states daughter has completed the registration process for [name] to access his VA benefits.  CSW provided [name] with aide and adult day care services available through [place].  In addition, CSW will add referral for [facility] to facilitate access VA benefits should family need.  Referral to [employer] was completed several months ago.  Discussed medical transportation.  [name] was unaware of medical transportation through [place].  Spouse states it is becoming increasing more difficult to transport [name] in car with walker.  PCP placed referral for home health PT.  Pt has used an agency before, [employer] and an agency out of [place] [employer].  Spouse prefers the agency out of [place], but unaware of the name.  CSW will inquire with [facility] and EMR for which [facility] agency was most recent.    EMR states upon d/c from [facility] pt received [facility] services from [place].  CSW will fax referral to [place].

## 2014-07-08 NOTE — Progress Notes (Signed)
   Subjective:    Patient ID: [name], male    DOB: 11/20/1940, 73 y.o.   MRN: [id number]  Hand Pain   Pt here for routine follow up. History obtained from wife and patient.  Pt noted to have elevated BP today. His medications were changed on last admission as it was controlled [name] states that his gait has gotten worse. He is able to ambulate small distances but not more and he has a history of frequent falls.  She expresses concern that patient is home alone during the day and sometimes she finds the water running or that he urinated on the floor. She is considering long term placement and would like to speak to [name] Patient with L sided hemiparesis s/p CVA and wife states patient does not move his left arm much but this is chronic since CVA Pt has been taken off DM meds and his last A1C was at goal. [name] also complains about worsening memory especially short term Pt states he feels well and denies any complaints   Review of Systems  Constitutional: Positive for activity change. Negative for fever, chills, diaphoresis, appetite change and fatigue.  HENT: Negative.   Eyes: Negative.   Respiratory: Negative.   Cardiovascular: Negative.   Gastrointestinal: Negative.   Endocrine: Negative.   Genitourinary: Negative.   Musculoskeletal: Negative.   Skin: Negative.   Neurological: Positive for weakness.       Objective:   Physical Exam  Constitutional: He is oriented to person, place, and time. He appears well-developed and well-nourished.  HENT:  Head: Normocephalic and atraumatic.  Eyes: Conjunctivae are normal.  Neck: Normal range of motion.  Cardiovascular: Normal rate and regular rhythm.  Exam reveals no friction rub.   No murmur heard. Pulmonary/Chest: Effort normal and breath sounds normal. No respiratory distress. He has no wheezes.  Abdominal: Soft. Bowel sounds are normal. He exhibits no distension. There is no tenderness.  Musculoskeletal: Normal  range of motion. He exhibits edema.  B/l LE edema 1 + L>R  Neurological: He is alert and oriented to person, place, and time.  Skin: Skin is warm and dry.          Assessment & Plan:  Please see problem based charting for assessment and plan:

## 2014-07-22 ENCOUNTER — Ambulatory Visit (INDEPENDENT_AMBULATORY_CARE_PROVIDER_SITE_OTHER): Payer: Commercial Managed Care - HMO | Admitting: Internal Medicine

## 2014-07-22 ENCOUNTER — Encounter: Payer: Self-pay | Admitting: Internal Medicine

## 2014-07-22 VITALS — BP 156/54 | HR 91 | Temp 98.4°F | Ht 65.0 in | Wt 190.0 lb

## 2014-07-22 DIAGNOSIS — E119 Type 2 diabetes mellitus without complications: Secondary | ICD-10-CM

## 2014-07-22 DIAGNOSIS — I10 Essential (primary) hypertension: Secondary | ICD-10-CM

## 2014-07-22 DIAGNOSIS — R296 Repeated falls: Secondary | ICD-10-CM

## 2014-07-22 DIAGNOSIS — F0151 Vascular dementia with behavioral disturbance: Secondary | ICD-10-CM

## 2014-07-22 LAB — BASIC METABOLIC PANEL WITH GFR
BUN: 25 mg/dL — ABNORMAL HIGH (ref 6–23)
CO2: 25 mEq/L (ref 19–32)
Calcium: 9.1 mg/dL (ref 8.4–10.5)
Chloride: 110 mEq/L (ref 96–112)
Creat: 1.11 mg/dL (ref 0.50–1.35)
GFR, EST AFRICAN AMERICAN: 77 mL/min
GFR, EST NON AFRICAN AMERICAN: 66 mL/min
Glucose, Bld: 137 mg/dL — ABNORMAL HIGH (ref 70–99)
POTASSIUM: 4.1 meq/L (ref 3.5–5.3)
SODIUM: 146 meq/L — AB (ref 135–145)

## 2014-07-22 NOTE — Assessment & Plan Note (Signed)
BP Readings from Last 3 Encounters:  07/22/14 156/54  07/08/14 176/52  05/24/14 182/86    Lab Results  Component Value Date   NA 145* 02/17/2014   K 3.9 02/17/2014   CREATININE 1.06 02/17/2014    Assessment: Blood pressure control: controlled Progress toward BP goal:  at goal Comments: The diastolic blood pressure between 43 and 59. Patient is asymptomatic. Has been taking lisinopril, and amlodipine, but never refilled Inspra from last visit.  Plan: Medications:  Continue with lisinopril 40 mg daily, and amlodipine 10 mg daily. Educational resources provided:   Self management tools provided:   Other plans: Will discontinue Inspra for now since pt has not been taking it anyway. It was for treatment of hyperaldosteronism. Discussed with Dr. [name] who is patient's PCP, who agreed with holding this medication. BMP ordered.

## 2014-07-22 NOTE — Progress Notes (Signed)
Patient ID: [name], male   DOB: 07-21-1941, 73 y.o.   MRN: [id number]   Subjective:   HPI: Mr.Soham L Torpey is a 73 y.o. male with a history of DM2, HTN, OSA, prior CVA, hyperaldosteronism, presenting for a follow-up visit for HTN  Reason(s) for this visit: 1. Blood pressure followup: BP today 156/59, with a pulse of 91. Repeat 134/43. Patient is asymptomatic. Was seen in the clinic by his PCP on 07/08/2014 where his Coreg dose was increased from 25 mg daily to 25 mg twice a day and also restarted on Inspra (for treatment of hyperaldosteronism). Patient reports that never picked [place] and therefore has not been taking it but has been compliant with his Lisinopril 40 mg daily, and Amlodipine10 mg daily. Wife manages all his pills as patient has dementia. No pill bottles today. She appears to have a clear understanding of all medications and she has a current medication list.  2. Fall: Wife reports, the patient attempted to take a bath by himself on 4 days ago, and he fell in the bathroom. This was a nontraumatic fall. Patient said that his slid in the bathtub, but did not hit his head. No neurological complaints. Normally his wife helps him with bathing. PCP is in the process of assist patient into long term placement as he depends on his wife for most of his ADLs. He has an outpatient neurology followup and home health PT, who will be coming out to his house tomorrow.   ROS: Constitutional: Denies fever, chills, diaphoresis, appetite change and fatigue.  Respiratory: Denies SOB, DOE, cough, chest tightness, and wheezing. Denies chest pain. CVS: No chest pain, palpitations and leg swelling.  GI: No abdominal pain, nausea, vomiting, bloody stools GU: No dysuria, frequency, hematuria, or flank pain.  MSK: No myalgias, back pain, joint swelling, arthralgias  Psych:dementia. No SI or SA.    Objective:  Physical Exam: Filed Vitals:   07/22/14 1039 07/22/14 1040  BP: 156/54     Pulse: 91   Temp: 98.4 F (36.9 C)   TempSrc: Oral   Height: 5\' 5"  (1.651 m)   Weight:  190 lb (86.183 kg)  SpO2: 91%    General: Well nourished. No acute distress. In [place]. Wife with him in exam room HEENT: Normal oral mucosa. MMM.  Lungs: CTA bilaterally. Heart: RRR; no extra sounds or murmurs  Abdomen: Non-distended, normal bowel sounds, soft, nontender; no hepatosplenomegaly  Extremities: bilateral edema.  No joint swelling or tenderness. Neurologic: Normal EOM,  Alert and oriented x3. No obvious neurologic/cranial nerve deficits.  Assessment & Plan:  Discussed case with my attending in the clinic, Dr. [name] See problem based charting.

## 2014-07-22 NOTE — Assessment & Plan Note (Signed)
Patient suffered a nontraumatic fall last week. PCP is in the process of assisting the patient get into long-term placement facility. She has home health PT ordered. They're coming out to his house tomorrow.

## 2014-07-22 NOTE — Assessment & Plan Note (Signed)
Diet controlled. Follow up with PCP

## 2014-07-22 NOTE — Patient Instructions (Signed)
General Instructions: Please continue with your medications as before  Please do not refill the inspra as prescribed before Please come back to see Dr [name] in 1-2 months  Please bring your medicines with you each time you come to clinic.  Medicines may include prescription medications, over-the-counter medications, herbal remedies, eye drops, vitamins, or other pills.   Progress Toward Treatment Goals:  Treatment Goal 07/22/2014  Hemoglobin A1C at goal  Blood pressure at goal  Prevent falls improved    Self Care Goals & Plans:  Self Care Goal 07/22/2014  Manage my medications take my medicines as prescribed; bring my medications to every visit; refill my medications on time  Monitor my health keep track of my blood glucose; bring my glucose meter and log to each visit  Eat healthy foods eat foods that are low in salt; eat baked foods instead of fried foods  Be physically active -    Home Blood Glucose Monitoring 07/22/2014  Check my blood sugar once a day  When to check my blood sugar before breakfast     Care Management & Community Referrals:  Referral 07/22/2014  Referrals made for care management support none needed

## 2014-07-22 NOTE — Assessment & Plan Note (Signed)
Will need long term placement. He depends on his wife for all his needs.

## 2014-07-26 NOTE — Progress Notes (Signed)
Internal Medicine Clinic Attending  Case discussed with Dr. Kazibwe soon after the resident saw the patient.  We reviewed the resident's history and exam and pertinent patient test results.  I agree with the assessment, diagnosis, and plan of care documented in the resident's note. 

## 2014-07-28 ENCOUNTER — Telehealth: Payer: Self-pay | Admitting: *Deleted

## 2014-07-28 NOTE — Telephone Encounter (Signed)
Call from [facility] with [facility] called asking for a Verbal Order for social work eval to identify community resources. # [phone]  Will this be okay with you?

## 2014-07-28 NOTE — Telephone Encounter (Signed)
That sounds good. Thank you. 

## 2014-08-03 ENCOUNTER — Telehealth: Payer: Self-pay | Admitting: *Deleted

## 2014-08-10 NOTE — Telephone Encounter (Signed)
closed

## 2014-08-11 ENCOUNTER — Telehealth: Payer: Self-pay | Admitting: *Deleted

## 2014-08-11 NOTE — Telephone Encounter (Signed)
Call from [place], [place] with [facility] - #  [phone]  Nurse saw pt today and pt reports urinary frequency and increase incontenince and reports dysuria. Nurse request Verbal order for U/A  I talked with Dr [name] and okay to do the U/A,  [facility] informed and will fax results.

## 2014-08-18 NOTE — Telephone Encounter (Signed)
Received lab report and urine was negative.  Signed by Dr Lynnae January and will be scanned in EPIC. Home health informed and I asked her to have the pt make appointment if still having problems. She will see pt tomorrow.

## 2014-08-25 ENCOUNTER — Telehealth: Payer: Self-pay | Admitting: *Deleted

## 2014-08-25 NOTE — Telephone Encounter (Signed)
Talked with Dr [name] about medical social worker - agrees. Jennifer aware. Hilda Blades Kenyatte Chatmon RN 08/25/14 11:10AM

## 2014-08-25 NOTE — Telephone Encounter (Signed)
Anderson Malta with [employer] [phone] called to see if an order for medical social worker for community resources. Could be obtained. Hilda Blades Khaila Velarde RN 08/25/14 11AM

## 2014-08-29 ENCOUNTER — Other Ambulatory Visit (HOSPITAL_COMMUNITY): Payer: Self-pay | Admitting: Psychiatry

## 2014-08-30 ENCOUNTER — Telehealth: Payer: Self-pay | Admitting: *Deleted

## 2014-08-30 NOTE — Telephone Encounter (Signed)
[name] from [facility] calls and request wraps for lower L leg due to edema, pt is not keeping leg elevated as he usually does, his wife is in hosp at this time and daughter is not there to assist him. Social work from the agency will visit him tomorrow, in the meantime we can call the [facility] who sees pt for additional info, rakeisha at [phone]. Please advise

## 2014-08-30 NOTE — Telephone Encounter (Signed)
We could do ACE compression wraps but I haven't seen or evaluated his legs. They could try this if they feel the edema is not too bad but if they feel he needs to be evaluated we can see him in the clinic. Thank you

## 2014-08-31 NOTE — Telephone Encounter (Signed)
Spoke w/ HHN and gave dr narendra's advice, also scheduled pt appt 11/23 w/ dr Randell Patient as pt's wife has hosp f/u w/ dr Randell Patient that day also, they will be seen back to back, spoke to doriss. And dr Randell Patient will not be scheduled any other pt's for that afternoon except ones on schedule as of today. [name] was called made aware and they both will be here

## 2014-08-31 NOTE — Telephone Encounter (Signed)
Thank you Helen 

## 2014-09-01 ENCOUNTER — Observation Stay (HOSPITAL_COMMUNITY)
Admission: AD | Admit: 2014-09-01 | Discharge: 2014-09-03 | Disposition: A | Discharge status: Home / Self Care | Payer: Commercial Managed Care - HMO | Source: Ambulatory Visit | Attending: Internal Medicine | Admitting: Internal Medicine

## 2014-09-01 ENCOUNTER — Inpatient Hospital Stay (HOSPITAL_COMMUNITY): Payer: Commercial Managed Care - HMO

## 2014-09-01 ENCOUNTER — Encounter: Payer: Self-pay | Admitting: Neurology

## 2014-09-01 ENCOUNTER — Telehealth: Payer: Self-pay | Admitting: *Deleted

## 2014-09-01 ENCOUNTER — Ambulatory Visit (INDEPENDENT_AMBULATORY_CARE_PROVIDER_SITE_OTHER): Payer: Medicare HMO | Admitting: Internal Medicine

## 2014-09-01 ENCOUNTER — Encounter (HOSPITAL_COMMUNITY): Payer: Self-pay | Admitting: General Practice

## 2014-09-01 ENCOUNTER — Encounter: Payer: Self-pay | Admitting: Internal Medicine

## 2014-09-01 VITALS — BP 151/74 | HR 86 | Temp 101.2°F | Ht 65.0 in

## 2014-09-01 DIAGNOSIS — E785 Hyperlipidemia, unspecified: Secondary | ICD-10-CM | POA: Diagnosis not present

## 2014-09-01 DIAGNOSIS — Z7902 Long term (current) use of antithrombotics/antiplatelets: Secondary | ICD-10-CM | POA: Diagnosis not present

## 2014-09-01 DIAGNOSIS — Z888 Allergy status to other drugs, medicaments and biological substances status: Secondary | ICD-10-CM | POA: Diagnosis not present

## 2014-09-01 DIAGNOSIS — I10 Essential (primary) hypertension: Secondary | ICD-10-CM | POA: Diagnosis not present

## 2014-09-01 DIAGNOSIS — E119 Type 2 diabetes mellitus without complications: Secondary | ICD-10-CM | POA: Insufficient documentation

## 2014-09-01 DIAGNOSIS — I509 Heart failure, unspecified: Secondary | ICD-10-CM | POA: Insufficient documentation

## 2014-09-01 DIAGNOSIS — Z8673 Personal history of transient ischemic attack (TIA), and cerebral infarction without residual deficits: Secondary | ICD-10-CM | POA: Insufficient documentation

## 2014-09-01 DIAGNOSIS — R509 Fever, unspecified: Secondary | ICD-10-CM | POA: Insufficient documentation

## 2014-09-01 DIAGNOSIS — Z79899 Other long term (current) drug therapy: Secondary | ICD-10-CM | POA: Diagnosis not present

## 2014-09-01 DIAGNOSIS — R531 Weakness: Secondary | ICD-10-CM | POA: Diagnosis present

## 2014-09-01 DIAGNOSIS — Z87891 Personal history of nicotine dependence: Secondary | ICD-10-CM | POA: Diagnosis not present

## 2014-09-01 DIAGNOSIS — F0390 Unspecified dementia without behavioral disturbance: Secondary | ICD-10-CM | POA: Diagnosis not present

## 2014-09-01 HISTORY — DX: Type 2 diabetes mellitus without complications: E11.9

## 2014-09-01 LAB — CBC WITH DIFFERENTIAL/PLATELET
Basophils Absolute: 0 10*3/uL (ref 0.0–0.1)
Basophils Relative: 0 % (ref 0–1)
Eosinophils Absolute: 0 10*3/uL (ref 0.0–0.7)
Eosinophils Relative: 0 % (ref 0–5)
HCT: 36.5 % — ABNORMAL LOW (ref 39.0–52.0)
HEMOGLOBIN: 11.3 g/dL — AB (ref 13.0–17.0)
LYMPHS ABS: 0.6 10*3/uL — AB (ref 0.7–4.0)
Lymphocytes Relative: 11 % — ABNORMAL LOW (ref 12–46)
MCH: 25.7 pg — AB (ref 26.0–34.0)
MCHC: 31 g/dL (ref 30.0–36.0)
MCV: 83.1 fL (ref 78.0–100.0)
MONOS PCT: 9 % (ref 3–12)
MPV: 10.7 fL (ref 9.4–12.4)
Monocytes Absolute: 0.5 10*3/uL (ref 0.1–1.0)
NEUTROS ABS: 4 10*3/uL (ref 1.7–7.7)
NEUTROS PCT: 80 % — AB (ref 43–77)
PLATELETS: 189 10*3/uL (ref 150–400)
RBC: 4.39 MIL/uL (ref 4.22–5.81)
RDW: 16.1 % — ABNORMAL HIGH (ref 11.5–15.5)
WBC: 5 10*3/uL (ref 4.0–10.5)

## 2014-09-01 LAB — POCT GLYCOSYLATED HEMOGLOBIN (HGB A1C): HEMOGLOBIN A1C: 5.9

## 2014-09-01 LAB — GLUCOSE, CAPILLARY
Glucose-Capillary: 106 mg/dL — ABNORMAL HIGH (ref 70–99)
Glucose-Capillary: 129 mg/dL — ABNORMAL HIGH (ref 70–99)
Glucose-Capillary: 132 mg/dL — ABNORMAL HIGH (ref 70–99)

## 2014-09-01 LAB — SEDIMENTATION RATE: SED RATE: 11 mm/h (ref 0–16)

## 2014-09-01 MED ORDER — SODIUM CHLORIDE 0.9 % IV SOLN
INTRAVENOUS | Status: AC
  Administered 2014-09-01: 75 mL/h via INTRAVENOUS

## 2014-09-01 MED ORDER — ACETAMINOPHEN 325 MG PO TABS
650.0000 mg | ORAL_TABLET | Freq: Four times a day (QID) | ORAL | Status: DC | PRN
  Administered 2014-09-01: 650 mg via ORAL
  Filled 2014-09-01: qty 2

## 2014-09-01 MED ORDER — CLOPIDOGREL BISULFATE 75 MG PO TABS
75.0000 mg | ORAL_TABLET | Freq: Every day | ORAL | Status: DC
  Administered 2014-09-01 – 2014-09-03 (×3): 75 mg via ORAL
  Filled 2014-09-01 (×3): qty 1

## 2014-09-01 MED ORDER — CARVEDILOL 25 MG PO TABS
25.0000 mg | ORAL_TABLET | Freq: Two times a day (BID) | ORAL | Status: DC
  Administered 2014-09-01 – 2014-09-02 (×3): 25 mg via ORAL
  Filled 2014-09-01 (×5): qty 1

## 2014-09-01 MED ORDER — AMLODIPINE BESYLATE 10 MG PO TABS
10.0000 mg | ORAL_TABLET | Freq: Every day | ORAL | Status: DC
  Administered 2014-09-01 – 2014-09-03 (×3): 10 mg via ORAL
  Filled 2014-09-01 (×3): qty 1

## 2014-09-01 MED ORDER — LISINOPRIL 40 MG PO TABS
40.0000 mg | ORAL_TABLET | Freq: Every day | ORAL | Status: DC
  Administered 2014-09-01 – 2014-09-03 (×3): 40 mg via ORAL
  Filled 2014-09-01 (×3): qty 1

## 2014-09-01 MED ORDER — DONEPEZIL HCL 10 MG PO TABS
10.0000 mg | ORAL_TABLET | Freq: Every day | ORAL | Status: DC
  Administered 2014-09-01 – 2014-09-02 (×2): 10 mg via ORAL
  Filled 2014-09-01 (×3): qty 1

## 2014-09-01 MED ORDER — SIMVASTATIN 20 MG PO TABS
20.0000 mg | ORAL_TABLET | Freq: Every day | ORAL | Status: DC
  Administered 2014-09-01 – 2014-09-02 (×2): 20 mg via ORAL
  Filled 2014-09-01 (×3): qty 1

## 2014-09-01 MED ORDER — MEMANTINE HCL ER 28 MG PO CP24
1.0000 | ORAL_CAPSULE | Freq: Every day | ORAL | Status: DC
  Administered 2014-09-01 – 2014-09-03 (×3): 28 mg via ORAL
  Filled 2014-09-01 (×3): qty 28

## 2014-09-01 MED ORDER — ENOXAPARIN SODIUM 40 MG/0.4ML ~~LOC~~ SOLN
40.0000 mg | SUBCUTANEOUS | Status: DC
  Administered 2014-09-01 – 2014-09-03 (×3): 40 mg via SUBCUTANEOUS
  Filled 2014-09-01 (×3): qty 0.4

## 2014-09-01 MED ORDER — INSULIN ASPART 100 UNIT/ML ~~LOC~~ SOLN
0.0000 [IU] | Freq: Three times a day (TID) | SUBCUTANEOUS | Status: DC
  Administered 2014-09-02 (×2): 1 [IU] via SUBCUTANEOUS
  Administered 2014-09-03: 2 [IU] via SUBCUTANEOUS

## 2014-09-01 NOTE — Progress Notes (Signed)
Medicine attending: Medical history, presenting problems, physical findings, and medications, reviewed with resident physician Dr. Jessee Avers and I concur with his evaluation and management plan.

## 2014-09-01 NOTE — Telephone Encounter (Signed)
Received call from [facility], Physical Therapist with [facility] She reports a temp of 101.8 oral, BP 156/68  Pulse 88 I called therapist back and HHN is in with pt now, she is wrapping legs at this time.  She states swelling has not changed butis also concerned with fever.   Therapist feels he is not ambulating as well as he has been.  I arranged appointment for 1:45 today and wife agrees to bring him in.

## 2014-09-01 NOTE — Progress Notes (Signed)
Patient ID: [name], male   DOB: Nov 08, 1940, 73 y.o.   MRN: [id number]   Subjective:   HPI: [name] is a 73 y.o. man with a history of DM2, HTN, OSA, prior CVA, hyperaldosteronism, presenting for an acute visit due to fever and increased weakness since this morning.  Reason(s) for this visit: 1. Fever and increased weakness: History is provided by his family as the patient has dementia. This morning, patient was unable to participate with Home health PT and he appeared less interactive. He was also noted to have increased weakness in his lower extremities. He was unable to get out of the bed as he normally does without any assistance. He denies headache, photophobia, neck pain, or any new neurological symptoms. Home health nurse checked his temperature aunt was elevated to 101.8 with a blood pressure 156/68, pulse rate of 88. Otherwise, patient without other symptoms to point to a source of fevers. Specifically, he denies cough, shortness of breath, dysuria, nausea or vomiting. Reportedly 11 of his family members have recently been sick with URIs.   ROS: Constitutional: Appetite has remained normal.  Respiratory: Denies SOB, DOE, cough, chest tightness, and wheezing. Denies chest pain. CVS: No chest pain, palpitations and leg swelling.  GI: No abdominal pain, nausea, vomiting, bloody stools GU: Chronic urinary incontinence without hematuria, or flank pain.  MSK: No myalgias, back pain, joint swelling, arthralgias  Psych: Chronic dementia. No SI or SA.    Objective:  Physical Exam: Filed Vitals:   09/01/14 1410  BP: 151/74  Pulse: 86  Temp: 101.2 F (38.4 C)  TempSrc: Oral  Height: 5\' 5"  (1.651 m)  SpO2: 98%   General: Well nourished. No acute distress.  HEENT: Normal oral mucosa. MMM.  Lungs: CTA bilaterally. Heart: RRR; no extra sounds or murmurs  Abdomen: Non-distended, normal bowel sounds, soft, nontender; no hepatosplenomegaly  Extremities: bilateral  lower extremity pedal edema with right leg wrapped in ACE bandage. No joint swelling or tenderness. Neurologic: Normal EOM,  Alert and oriented x2. No obvious neurologic/cranial nerve deficits. Patient is aware that is at the clinic. Able to recognize his wife and the rest of his family. Thinks that this is 2014. Unable to tell the day of the week, family states this is in not new for him. No neck rigidity.   Assessment & Plan:  Discussed case with my attending in the clinic, Dr. Beryle Beams. See problem based charting.   Given the patient's comorbidities, the patient will be admitted for further evaluation. I have ordered stat CBC with differential, CMP, urinalysis, CRP, and sedimentation rate in the pursuit of the etiology of his fevers. Possible etiologies include a viral syndrome given his contact with several family members with URI, however, occult bacteremia, UTI or a respiratory tract infection cannot be excluded. Patient is otherwise hemodynamically stable and non toxic appearing. He'll be admitted to a MedSurg bed. I have paged the on call resident.

## 2014-09-01 NOTE — Progress Notes (Signed)
Report called to Katie on [address].  Pt is alert and transported via wheelchair to [facility].  Family with patient.  Sander Nephew, RN 09/01/2014 4:33 PM.

## 2014-09-01 NOTE — Telephone Encounter (Signed)
Agree with appt Thanks 

## 2014-09-01 NOTE — Progress Notes (Signed)
[name] [id number] Admission Data: 09/01/2014 5:18 PM Attending Provider: Axel Filler, MD  [id number], Henry Citrin, MD Consults/ Treatment Team:    [name] is a 73 y.o. male patient, direct admit, alert  & orientated  X 2,  Full Code, VSS - Blood pressure 165/73, pulse 85, temperature 101.4 F (38.6 C), temperature source Oral, resp. rate 20, SpO2 98 %.,  no c/o shortness of breath, no c/o chest pain, no distress noted.     Allergies:   Allergies  Allergen Reactions  . Pioglitazone Other (See Comments)    Edema   . Rosiglitazone Maleate Other (See Comments)    edema     Past Medical History  Diagnosis Date  . Stroke     left thalamic infarct  . Diabetes mellitus   . Hypertension   . 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 TIA (transient ischemic attack)     multiple in past, most recently in 2011  . Sleep apnea   . CHF (congestive heart failure)   . Depression   . Hyperlipemia   . Arthritis       Pt orientation to unit, room and routine. Information packet given to patient/family and safety video watched.  Admission INP armband ID verified with patient/family, and in place. SR up x 2, fall risk assessment complete with Patient and family verbalizing understanding of risks associated with falls. Pt verbalizes an understanding of how to use the call bell and to call for help before getting out of bed.  Skin, clean-dry- intact without evidence of bruising, or skin tears.   No evidence of skin break down noted on exam.     Will cont to monitor and assist as needed.  Henry Points, RN 09/01/2014 5:18 PM

## 2014-09-01 NOTE — H&P (Signed)
Date: 09/01/2014               Patient Name:  [name] MRN: [id number]  DOB: 07/18/1941 Age / Sex: 73 y.o., male   PCP: Aldine Contes, MD         Medical Service: Internal Medicine Teaching Service         Attending Physician: Dr. Axel Filler, MD    First Contact: Dr. Genene Churn Pager: [phone]  Second Contact: Dr. Ronnald Ramp Pager: [phone]       After Hours (After 5p/  First Contact Pager: [phone]  weekends / holidays): Second Contact Pager: [phone]   Chief Complaint: fever and generalized weakness  History of Present Illness:   73 yo male with CVA 2011, CHF, DM (diet controlled), HLD here with fever and generalized weakness since today. Patient woke up this morning and felt generalized weakness, mainly on both legs. When home PT came, he wasn't able to participate with them due to his weakness. Temp was checked by home health nruse and was 101.8. He usually uses walker but today he wasn't able to get out of bed because of weakness. He denies any numbness ,tingling, focal weakness, vision changes. No chest pain, sob. His family members recently have viral gastro and URI symptoms. He denies any diarrhea/n/v.   Has recently noticed left leg swelling, which is wrapped with dressing. No wounds, just increased swelling, more compared to the right. He does not keep his leg elevated most of the time. Denies any dysuria, recent travel.  Has remote hx of blood clot. Not sure of previous location.  Meds: Current Facility-Administered Medications  Medication Dose Route Frequency Provider Last Rate Last Dose  . amLODipine (NORVASC) tablet 10 mg  10 mg Oral Daily Corky Sox, MD   10 mg at 09/01/14 1755  . carvedilol (COREG) tablet 25 mg  25 mg Oral BID Corky Sox, MD      . clopidogrel (PLAVIX) tablet 75 mg  75 mg Oral Daily Corky Sox, MD   75 mg at 09/01/14 1755  . donepezil (ARICEPT) tablet 10 mg  10 mg Oral QHS Corky Sox, MD      . enoxaparin (LOVENOX) injection 40 mg  40  mg Subcutaneous Q24H Corky Sox, MD   40 mg at 09/01/14 1800  . [START ON 09/02/2014] insulin aspart (novoLOG) injection 0-9 Units  0-9 Units Subcutaneous TID WC Corky Sox, MD      . lisinopril (PRINIVIL,ZESTRIL) tablet 40 mg  40 mg Oral Daily Corky Sox, MD   40 mg at 09/01/14 1800  . Memantine HCl ER CP24 28 mg  1 capsule Oral Daily Corky Sox, MD   28 mg at 09/01/14 1800  . simvastatin (ZOCOR) tablet 20 mg  20 mg Oral QHS Corky Sox, MD        Allergies: Allergies as of 09/01/2014 - Review Complete 07/22/2014  Allergen Reaction Noted  . Pioglitazone Other (See Comments)   . Rosiglitazone maleate Other (See Comments)    Past Medical History  Diagnosis Date  . Stroke     left thalamic infarct  . Diabetes mellitus   . Hypertension   . 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 TIA (transient ischemic attack)     multiple in past, most recently in 2011  . Sleep apnea   .  CHF (congestive heart failure)   . Depression   . Hyperlipemia   . Arthritis    Past Surgical History  Procedure Laterality Date  . Knee arthroscopy      lt knee  . Shoulder arthroscopy w/ rotator cuff repair      rt shoulder  . Intracranial aneurysm repair    . Neck artery repaired from injury     Family History  Problem Relation Age of Onset  . Colon cancer Neg Hx   . Diabetes Sister   . Diabetes Brother   . Diabetes Paternal Aunt   . Stroke Mother   . Pneumonia Father    History   Social History  . Marital Status: Married    Spouse Name: [name]    Number of Children: 3  . Years of Education: HS   Occupational History  . disabled    Social History Main Topics  . Smoking status: Former Smoker -- 1.50 packs/day for 15 years    Types: Cigarettes    Quit date: 10/16/1983  . Smokeless tobacco: Never Used     Comment: Quit smoking 30 yrs ago   . Alcohol Use: No  . Drug Use:  No  . Sexual Activity: Yes   Other Topics Concern  . Not on file   Social History Narrative   Pt lives at home with spouse. Currently in rehab at [facility].   Caffeine Use: 1 cup daily    Review of Systems: Review of Systems  Constitutional: Positive for fever and chills. Negative for malaise/fatigue and diaphoresis.  HENT: Negative for congestion, ear discharge, sore throat and tinnitus.   Eyes: Negative for blurred vision, double vision, pain, discharge and redness.  Respiratory: Negative for cough, hemoptysis, sputum production, shortness of breath and wheezing.   Cardiovascular: Positive for leg swelling. Negative for chest pain, palpitations, orthopnea and claudication.  Gastrointestinal: Negative.   Genitourinary: Negative.  Negative for dysuria, urgency and frequency.       Has urinary incontinence  Musculoskeletal: Negative.   Skin: Negative.   Neurological: Positive for weakness. Negative for headaches.  Endo/Heme/Allergies: Negative.   Psychiatric/Behavioral: Negative.      Physical Exam: Blood pressure 165/73, pulse 85, temperature 101.4 F (38.6 C), temperature source Oral, resp. rate 20, SpO2 98 %.  Physical Exam  Constitutional: He is oriented to person, place, and time. He appears well-developed and well-nourished. No distress.  Has some slowless in responding. Baseline, hx of dementia.  HENT:  Head: Normocephalic and atraumatic.  Right Ear: External ear normal.  Left Ear: External ear normal.  Nose: Nose normal.  Mouth/Throat: Oropharynx is clear and moist. No oropharyngeal exudate.  Eyes: Conjunctivae and EOM are normal. Pupils are equal, round, and reactive to light. Right eye exhibits no discharge. Left eye exhibits no discharge. No scleral icterus.  Neck: Normal range of motion. Neck supple. No JVD present. No tracheal deviation present.  Cardiovascular: Normal rate, regular rhythm, normal heart sounds and intact distal pulses.  Exam reveals no gallop  and no friction rub.   No murmur heard. Respiratory: Effort normal and breath sounds normal. No stridor. No respiratory distress. He has no wheezes. He has no rales. He exhibits no tenderness.  GI: Soft. Bowel sounds are normal. He exhibits no distension. There is no tenderness. There is no rebound.  Musculoskeletal: Normal range of motion. He exhibits edema and tenderness.  Has 1+ non pitting edema on right leg. Non ttp. Left leg is wrapped with dressing. Slightly larger and  TTP.  Lymphadenopathy:    He has no cervical adenopathy.  Neurological: He is alert and oriented to person, place, and time. He has normal reflexes. He displays normal reflexes. No cranial nerve deficit. He exhibits normal muscle tone. Coordination normal.  No neuro deficits.  Skin: Skin is warm and dry. He is not diaphoretic.     Lab results: Basic Metabolic Panel:  Recent Labs  09/01/14 1438  NA 147*  K 3.8  CL 109  CO2 28  GLUCOSE 121*  BUN 22  CREATININE 0.97  CALCIUM 8.8   Liver Function Tests:  Recent Labs  09/01/14 1438  AST 20  ALT 26  ALKPHOS 62  BILITOT 1.0  PROT 6.8  ALBUMIN 3.5   No results for input(s): LIPASE, AMYLASE in the last 72 hours. No results for input(s): AMMONIA in the last 72 hours. CBC:  Recent Labs  09/01/14 1438  WBC 5.0  NEUTROABS 4.0  HGB 11.3*  HCT 36.5*  MCV 83.1  PLT 189   Cardiac Enzymes: No results for input(s): CKTOTAL, CKMB, CKMBINDEX, TROPONINI in the last 72 hours. BNP: No results for input(s): PROBNP in the last 72 hours. D-Dimer: No results for input(s): DDIMER in the last 72 hours. CBG:  Recent Labs  09/01/14 1401 09/01/14 1705  GLUCAP 106* 129*   Hemoglobin A1C:  Recent Labs  09/01/14 1411  HGBA1C 5.9   Fasting Lipid Panel: No results for input(s): CHOL, HDL, LDLCALC, TRIG, CHOLHDL, LDLDIRECT in the last 72 hours. Thyroid Function Tests: No results for input(s): TSH, T4TOTAL, FREET4, T3FREE, THYROIDAB in the last 72  hours. Anemia Panel: No results for input(s): VITAMINB12, FOLATE, FERRITIN, TIBC, IRON, RETICCTPCT in the last 72 hours. Coagulation: No results for input(s): LABPROT, INR in the last 72 hours. Urine Drug Screen: Drugs of Abuse     Component Value Date/Time   LABOPIA NONE DETECTED 08/06/2012 1900   LABOPIA NEGATIVE 03/11/2009 0532   COCAINSCRNUR NONE DETECTED 08/06/2012 1900   COCAINSCRNUR NEGATIVE 03/11/2009 0532   LABBENZ NONE DETECTED 08/06/2012 1900   LABBENZ NEGATIVE 03/11/2009 0532   AMPHETMU NONE DETECTED 08/06/2012 1900   AMPHETMU NEGATIVE 03/11/2009 0532   THCU NONE DETECTED 08/06/2012 1900   LABBARB NONE DETECTED 08/06/2012 1900    Alcohol Level: No results for input(s): ETH in the last 72 hours. Urinalysis: No results for input(s): COLORURINE, LABSPEC, PHURINE, GLUCOSEU, HGBUR, BILIRUBINUR, KETONESUR, PROTEINUR, UROBILINOGEN, NITRITE, LEUKOCYTESUR in the last 72 hours.  Invalid input(s): APPERANCEUR Misc. Labs:  Imaging results:  No results found.  Other results: EKG: RBBB, TWI v1-v3, I, TW flattening avf, unchanged from previous.  Assessment & Plan by Problem: Active Problems:   Weakness generalized  73 yo male with CVA 2011, CHF, DM (diet controlled), HLD here with fever and generalized weakness since this morning.  Fever with generalized weakness- likely viral URI. Need to rule source. DVT could be another source. No neuro deficits, no headache, unlikely meningitis or stroke. No confusion or LOC.   -CXR, UA, BCX - tylenol prn fever for now. - influenza - 75 cc/hr.   HLD - cont statin  DM - diet controlled - hgba1c 5.9. 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  Dispo: Disposition is deferred at this time, awaiting improvement of current medical problems. Anticipated discharge in approximately 1-2 day(s).   The patient  does  have a current PCP (Aldine Contes, MD) and does need an [facility] hospital follow-up appointment after discharge.  The patient does have transportation limitations that hinder transportation to clinic appointments.  Signed: Dellia Nims, MD 09/01/2014, 6:05 PM

## 2014-09-02 DIAGNOSIS — R609 Edema, unspecified: Secondary | ICD-10-CM

## 2014-09-02 DIAGNOSIS — R531 Weakness: Secondary | ICD-10-CM | POA: Diagnosis not present

## 2014-09-02 LAB — CBC
HEMATOCRIT: 31.1 % — AB (ref 39.0–52.0)
Hemoglobin: 9.3 g/dL — ABNORMAL LOW (ref 13.0–17.0)
MCH: 25.2 pg — AB (ref 26.0–34.0)
MCHC: 29.9 g/dL — AB (ref 30.0–36.0)
MCV: 84.3 fL (ref 78.0–100.0)
Platelets: 160 10*3/uL (ref 150–400)
RBC: 3.69 MIL/uL — ABNORMAL LOW (ref 4.22–5.81)
RDW: 16.3 % — AB (ref 11.5–15.5)
WBC: 3 10*3/uL — ABNORMAL LOW (ref 4.0–10.5)

## 2014-09-02 LAB — BASIC METABOLIC PANEL
Anion gap: 9 (ref 5–15)
BUN: 21 mg/dL (ref 6–23)
CO2: 26 meq/L (ref 19–32)
CREATININE: 1.05 mg/dL (ref 0.50–1.35)
Calcium: 8.2 mg/dL — ABNORMAL LOW (ref 8.4–10.5)
Chloride: 111 mEq/L (ref 96–112)
GFR calc Af Amer: 80 mL/min — ABNORMAL LOW (ref >90)
GFR calc non Af Amer: 69 mL/min — ABNORMAL LOW (ref >90)
Glucose, Bld: 125 mg/dL — ABNORMAL HIGH (ref 70–99)
Potassium: 3.6 mEq/L — ABNORMAL LOW (ref 3.7–5.3)
Sodium: 146 mEq/L (ref 137–147)

## 2014-09-02 LAB — DIFFERENTIAL
Basophils Absolute: 0 10*3/uL (ref 0.0–0.1)
Basophils Relative: 0 % (ref 0–1)
Eosinophils Absolute: 0 10*3/uL (ref 0.0–0.7)
Eosinophils Relative: 1 % (ref 0–5)
Lymphocytes Relative: 36 % (ref 12–46)
Lymphs Abs: 1.1 10*3/uL (ref 0.7–4.0)
Monocytes Absolute: 0.4 10*3/uL (ref 0.1–1.0)
Monocytes Relative: 14 % — ABNORMAL HIGH (ref 3–12)
Neutro Abs: 1.5 10*3/uL — ABNORMAL LOW (ref 1.7–7.7)
Neutrophils Relative %: 49 % (ref 43–77)

## 2014-09-02 LAB — GLUCOSE, CAPILLARY
Glucose-Capillary: 117 mg/dL — ABNORMAL HIGH (ref 70–99)
Glucose-Capillary: 123 mg/dL — ABNORMAL HIGH (ref 70–99)
Glucose-Capillary: 133 mg/dL — ABNORMAL HIGH (ref 70–99)
Glucose-Capillary: 134 mg/dL — ABNORMAL HIGH (ref 70–99)

## 2014-09-02 LAB — URINALYSIS, ROUTINE W REFLEX MICROSCOPIC
Glucose, UA: NEGATIVE mg/dL
Hgb urine dipstick: NEGATIVE
Ketones, ur: 15 mg/dL — AB
Leukocytes, UA: NEGATIVE
NITRITE: NEGATIVE
Protein, ur: NEGATIVE mg/dL
Specific Gravity, Urine: 1.03 (ref 1.005–1.030)
Urobilinogen, UA: 1 mg/dL (ref 0.0–1.0)
pH: 5.5 (ref 5.0–8.0)

## 2014-09-02 LAB — INFLUENZA PANEL BY PCR (TYPE A & B)
H1N1FLUPCR: NOT DETECTED
Influenza A By PCR: NEGATIVE
Influenza B By PCR: NEGATIVE

## 2014-09-02 MED ORDER — POTASSIUM CHLORIDE CRYS ER 20 MEQ PO TBCR
40.0000 meq | EXTENDED_RELEASE_TABLET | Freq: Once | ORAL | Status: AC
  Administered 2014-09-02: 40 meq via ORAL
  Filled 2014-09-02: qty 2

## 2014-09-02 NOTE — Progress Notes (Signed)
VASCULAR LAB PRELIMINARY  PRELIMINARY  PRELIMINARY  PRELIMINARY  Left lower extremity venous duplex completed.    Preliminary report:  Left:  No evidence of DVT, superficial thrombosis, or Baker's cyst.  [name], RVT 09/02/2014, 4:01 PM

## 2014-09-02 NOTE — Progress Notes (Signed)
Subjective:  Patient reports no acute events overnight. He states that he slept well and feels better than he did yesterday. He had a mild dry cough when he first woke up, but admits that it has since improved. He denies any cp, shob, HA, dizziness, abdominal pain, dysuria, n/v/d.   Objective: Vital signs in last 24 hours: Filed Vitals:   09/01/14 1650 09/01/14 2200 09/02/14 0558  BP: 165/73 151/55 118/55  Pulse: 85 84 60  Temp: 101.4 F (38.6 C) 100.5 F (38.1 C) 99.1 F (37.3 C)  TempSrc: Oral Oral Oral  Resp: 20 20 20   Height: 5\' 5"  (1.651 m)    Weight: 89.268 kg (196 lb 12.8 oz)    SpO2: 98% 100% 99%    Intake/Output Summary (Last 24 hours) at 09/02/14 0858 Last data filed at 09/02/14 0600  Gross per 24 hour  Intake      0 ml  Output    200 ml  Net   -200 ml   BP 118/55 mmHg  Pulse 60  Temp(Src) 99.1 F (37.3 C) (Oral)  Resp 20  Ht 5\' 5"  (1.651 m)  Wt 89.268 kg (196 lb 12.8 oz)  BMI 32.75 kg/m2  SpO2 99%   Vital signs reviewed and are stable. General appearance: alert, cooperative, no distress and slowed mentation Head: Normocephalic, without obvious abnormality, atraumatic Eyes: conjunctivae/corneas clear. PERRL, EOM's intact. Fundi benign. Ears: normal TM's and external ear canals both ears Throat: normal findings: tongue midline and normal and oropharynx pink & moist without lesions or evidence of thrush Lungs: clear to auscultation bilaterally Heart: regular rate and rhythm, S1, S2 normal, no murmur, click, rub or gallop Abdomen: soft, non-tender; bowel sounds normal; no masses,  no organomegaly Extremities: edema R>L. 2+ on R side to midshin, 1+ L side to midshin. Homan's negative b/l. No TTP b/l. Non-erythematous b/l.  Neurologic: Mental status: Alert, oriented, thought content appropriate Motor: grossly normal   Lab Results: Basic Metabolic Panel:  Recent Labs Lab 09/01/14 1438 09/02/14 0530  NA 147* 146  K 3.8 3.6*  CL 109 111  CO2 28 26    GLUCOSE 121* 125*  BUN 22 21  CREATININE 0.97 1.05  CALCIUM 8.8 8.2*   Liver Function Tests:  Recent Labs Lab 09/01/14 1438  AST 20  ALT 26  ALKPHOS 62  BILITOT 1.0  PROT 6.8  ALBUMIN 3.5   CBC:  Recent Labs Lab 09/01/14 1438 09/02/14 0530  WBC 5.0 3.0*  NEUTROABS 4.0  --   HGB 11.3* 9.3*  HCT 36.5* 31.1*  MCV 83.1 84.3  PLT 189 160   CBG:  Recent Labs Lab 09/01/14 1401 09/01/14 1705 09/01/14 2156 09/02/14 0800  GLUCAP 106* 129* 132* 117*   Hemoglobin A1C:  Recent Labs Lab 09/01/14 1411  HGBA1C 5.9   Urine Drug Screen: Drugs of Abuse     Component Value Date/Time   LABOPIA NONE DETECTED 08/06/2012 1900   LABOPIA NEGATIVE 03/11/2009 0532   COCAINSCRNUR NONE DETECTED 08/06/2012 1900   COCAINSCRNUR NEGATIVE 03/11/2009 0532   LABBENZ NONE DETECTED 08/06/2012 1900   LABBENZ NEGATIVE 03/11/2009 0532   AMPHETMU NONE DETECTED 08/06/2012 1900   AMPHETMU NEGATIVE 03/11/2009 0532   THCU NONE DETECTED 08/06/2012 1900   LABBARB NONE DETECTED 08/06/2012 1900    Urinalysis:   - PENDING. Was supposed to be drawn yesterday, but was not performed   Influenza: Negative  Studies/Results: X-ray Chest Pa And Lateral  09/02/2014   CLINICAL DATA:  Fever and  cough  EXAM: CHEST  2 VIEW  COMPARISON:  03/15/2013  FINDINGS: Cardiac shadow is stable. The lungs are well aerated bilaterally. No focal infiltrate or sizable effusion is seen. No acute bony abnormality is noted.  IMPRESSION: No active cardiopulmonary disease.   Electronically Signed   By: Inez Catalina M.D.   On: 09/02/2014 00:14   Medications: I have reviewed the patient's current medications. Scheduled Meds: . amLODipine  10 mg Oral Daily  . carvedilol  25 mg Oral BID  . clopidogrel  75 mg Oral Daily  . donepezil  10 mg Oral QHS  . enoxaparin (LOVENOX) injection  40 mg Subcutaneous Q24H  . insulin aspart  0-9 Units Subcutaneous TID WC  . lisinopril  40 mg Oral Daily  . Memantine HCl ER  1 capsule Oral  Daily  . simvastatin  20 mg Oral QHS   PRN Meds:.acetaminophen 650mg  Q6H for fever  Assessment/Plan: Active Problems:   Weakness generalized   Fever  73 y.o. Male with PMHx CVA (2011), CHF, DM (diet controlled), HLD, and dementia on HD 1 for generalized weakness and fever.  1.) Fever with generalized weakness: Fever has come down since yesterday (101.4 -> 99.1). Influenza panel was negative. Patient states he feels better than yesterday but still weak. CXR was negative   - LLE Dopplers pending for possible DVT   - BCx pending   - UA pending   - Continue acetaminophen 650mg  Q6H prn  2.) HLD   - Continue statin therapy  3.) DM: Diet controlled - A1c is 5.9%   - Monitor, no tx necessary at this time  4.) HTN: BP has been stable in the 120/60 range.   - Continue Coreg 25mg  BID   - Continue Amlodipine 10mg  QD  5.) Dementia:    - Continue memantine, aricept  6.) Stroke Ppx   - Continue Plavix 75mg  QD  7.) Hypokalemia: Slightly low today at 3.6   - Patient given 40 mEq PO  8.) DVT/PE Ppx   - Continue lovenox 40mg  QD  Dispo: Disposition is deferred at this time, awaiting improvement of current medical problems.  Anticipated discharge in approximately 1-2 day(s).   The patient does have a current PCP (Aldine Contes, MD) and does not need an [facility] hospital follow-up appointment after discharge.  The patient does not have transportation limitations that hinder transportation to clinic appointments.  .Services Needed at time of discharge: Y = Yes, Blank = No PT:   OT:   RN:   Equipment:   Other:     LOS: 1 day   Farrell Ours, Med Student 09/02/2014, 8:58 AM

## 2014-09-02 NOTE — Progress Notes (Signed)
Nutrition Brief Note  Patient identified on the Malnutrition Screening Tool (MST) Report. Patient with 12% weight loss in 8 months, weight has trended back up to ~190 lb in October. Current weight likely slightly elevated with edema. Current weight is only 7% below weight from 1 year ago. Weight changes are not significant. PO intake has been adequate.   Wt Readings from Last 15 Encounters:  09/01/14 196 lb 12.8 oz (89.268 kg)  07/22/14 190 lb (86.183 kg)  07/08/14 190 lb (86.183 kg)  06/08/14 198 lb 12.8 oz (90.175 kg)  05/24/14 183 lb 12.8 oz (83.371 kg)  02/16/14 196 lb (88.905 kg)  09/23/13 209 lb (94.802 kg)  09/17/13 210 lb (95.255 kg)  09/16/13 210 lb 6.4 oz (95.437 kg)  09/09/13 211 lb (95.709 kg)  06/24/13 213 lb 12.8 oz (96.979 kg)  06/17/13 213 lb (96.616 kg)  05/20/13 216 lb (97.977 kg)  04/16/13 218 lb (98.884 kg)  09/23/12 219 lb (99.338 kg)    Body mass index is 32.75 kg/(m^2). Patient meets criteria for class 1 obesity based on current BMI.   Current diet order is CHO-modified, patient is consuming approximately 50% of meals at this time. Labs and medications reviewed.   No nutrition interventions warranted at this time. If nutrition issues arise, please consult RD.    Molli Barrows, RD, LDN, [place] Pager [phone] After Hours Pager [phone]

## 2014-09-02 NOTE — Evaluation (Signed)
Physical Therapy Evaluation Patient Details Name: [name] MRN: [id number] DOB: October 05, 1941 Today's Date: 09/02/2014   History of Present Illness  pt admitted with fever and weakness.  Patient woke up this morning and felt generalized weakness, mainly on both legs. When home PT came, he wasn't able to participate with them due to his weakness. Temp was checked by home health nruse and was 101.8. He usually uses walker but today he wasn't able to get out of bed because of weakness. He denies any numbness ,tingling, focal weakness, vision changes. No chest pain, sob. His family members recently have viral gastro and URI symptoms. He denies any diarrhea/n/v.   Work up continues  Clinical Impression  Pt admitted with/for generalize weakness and fever making it difficult for pt to move..  Pt currently limited functionally due to the problems listed below.  (see problems list.)  Pt will benefit from PT to maximize function and safety to be able to get home safely with available assist of family.     Follow Up Recommendations Home health PT    Equipment Recommendations  None recommended by PT    Recommendations for Other Services       Precautions / Restrictions Precautions Precautions: Fall      Mobility  Bed Mobility Overal bed mobility: Needs Assistance Bed Mobility: Supine to Sit     Supine to sit: Supervision;HOB elevated (with rail)     General bed mobility comments: pt able to build momentum to move fairly easily.  Transfers Overall transfer level: Needs assistance   Transfers: Sit to/from Stand Sit to Stand: Min guard;Supervision         General transfer comment: uses legs against the bed or chair to stabilize.  Ambulation/Gait Ambulation/Gait assistance: Min assist;Min guard Ambulation Distance (Feet): 125 Feet Assistive device: 1 person hand held assist (or min guard with rail (used as cane) Gait Pattern/deviations: Step-through pattern Gait velocity:  slow and deliberate Gait velocity interpretation: Below normal speed for age/gender General Gait Details: slow, fairly equal steps, less heel/toe on Left, mildly unsteady overall  Stairs            Wheelchair Mobility    Modified Rankin (Stroke Patients Only) Modified Rankin (Stroke Patients Only) Modified Rankin: Moderate disability     Balance Overall balance assessment: Needs assistance Sitting-balance support: No upper extremity supported Sitting balance-Leahy Scale: Fair     Standing balance support: No upper extremity supported;Single extremity supported Standing balance-Leahy Scale: Fair Standing balance comment: steady statically, more unsteady dynamically                             Pertinent Vitals/Pain Pain Assessment: Faces Faces Pain Scale: Hurts little more Pain Location: R knee with ambulation Pain Descriptors / Indicators: Grimacing Pain Intervention(s): Limited activity within patient's tolerance    Home Living Family/patient expects to be discharged to:: Private residence Living Arrangements: Spouse/significant other;Other relatives Available Help at Discharge: Family;Available PRN/intermittently (significant other works ~6 hrs and pt alone.) Type of Home: House Home Access: Stairs to enter Entrance Stairs-Rails: Left Entrance Stairs-Number of Steps: 2 (front and back, back has rails) Home Layout: One level Home Equipment: Walker - 2 wheels;Cane - single point;Shower seat;Grab bars - tub/shower;Wheelchair - manual Additional Comments: pt uses canes in home and should use RW in/out of house but will only use cane    Prior Function Level of Independence: Independent with assistive device(s)  Hand Dominance        Extremity/Trunk Assessment   Upper Extremity Assessment:  (R wfl, L weaker moves in synergy,)           Lower Extremity Assessment: Overall WFL for tasks assessed (generally isolated movement, L  weaker than R LE)         Communication   Communication: No difficulties  Cognition Arousal/Alertness: Awake/alert Behavior During Therapy: WFL for tasks assessed/performed Overall Cognitive Status: Within Functional Limits for tasks assessed                      General Comments General comments (skin integrity, edema, etc.): pt's significant other [facility]    Exercises        Assessment/Plan    PT Assessment Patient needs continued PT services  PT Diagnosis Difficulty walking;Generalized weakness (mild 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 PT Goal Formulation: With patient Time For Goal Achievement: 09/09/14 Potential to Achieve Goals: Good    Frequency Min 3X/week   Barriers to discharge Decreased caregiver support (wife) works and no one home at that time.    Co-evaluation               End of Session   Activity Tolerance: Patient tolerated treatment well Patient left: in chair;with call bell/phone within reach;with family/visitor present Nurse Communication: Mobility status         Time: 2694-8546 PT Time Calculation (min) (ACUTE ONLY): 28 min   Charges:   PT Evaluation $Initial PT Evaluation Tier I: 1 Procedure PT Treatments $Gait Training: 8-22 mins   PT G Codes:          [name] Patient, Tessie Fass 09/02/2014, 12:32 PM 09/02/2014  Donnella Sham, PT [phone]  (pager)

## 2014-09-02 NOTE — Care Management (Signed)
Utilization review completed.  

## 2014-09-02 NOTE — Plan of Care (Signed)
Problem: Phase I Progression Outcomes Goal: Pain controlled with appropriate interventions Outcome: Completed/Met Date Met:  09/02/14 Goal: OOB as tolerated unless otherwise ordered Outcome: Progressing Goal: Voiding-avoid urinary catheter unless indicated Outcome: Completed/Met Date Met:  09/02/14  Problem: Phase II Progression Outcomes Goal: IV changed to normal saline lock Outcome: Completed/Met Date Met:  09/02/14 Goal: Obtain order to discontinue catheter if appropriate Outcome: Not Applicable Date Met:  42/39/53  Problem: Phase III Progression Outcomes Goal: Pain controlled on oral analgesia Outcome: Completed/Met Date Met:  09/02/14 Pt does not c/o pain  Goal: Voiding independently Outcome: Completed/Met Date Met:  09/02/14 Goal: Foley discontinued Outcome: Not Applicable Date Met:  20/23/34

## 2014-09-02 NOTE — Care Management Note (Signed)
    Page 1 of 2   09/03/2014     3:41:37 PM CARE MANAGEMENT NOTE 09/03/2014  Patient:  [name], [name]   Account Number:  [id number]  Date Initiated:  09/02/2014  Documentation initiated by:  Tomi Bamberger  Subjective/Objective Assessment:   dx fever leg weakness  admit- lives with spouse  Patient states he is active with [facility]     Action/Plan:   pt eval- rec hhpt   Anticipated DC Date:  09/03/2014   Anticipated DC Plan:  [place]  CM consult      [facility] [place] Choice  Resumption Of Svcs/PTA Provider   Choice offered to / List presented to:  C-1 Patient        [place] arranged  HH-1 RN  [place]   Status of service:  Completed, signed off Medicare Important Message given?  NO (If response is "NO", the following Medicare IM given date fields will be blank) Date Medicare IM given:   Medicare IM given by:   Date Additional Medicare IM given:   Additional Medicare IM given by:    Discharge Disposition:  [place]  Per UR Regulation:  Reviewed for med. necessity/level of care/duration of stay  If discussed at [place] of Stay Meetings, dates discussed:    Comments:  09/03/14 [place], BSN [phone] patient will resume with [facility] for [place], orders faxed to [facility].  09/02/14 [place], BSN [phone] patient lives with spouse, patient states he is active with [facility], NCM notified [facility] , their fax number is [phone], will need to fax resume orders to them for [facility] and [place].  NCM will continue to follow for dc needs.

## 2014-09-02 NOTE — Progress Notes (Addendum)
Subjective: Doing well. No cough, afebrile currently. Last fever recorded was last night. No URI symptoms. No chest pain/sob, dysuria. No pain on left leg, just had the dressing on because had some swelling.  Objective: Vital signs in last 24 hours: Filed Vitals:   09/01/14 2200 09/02/14 0558 09/02/14 1016 09/02/14 1318  BP: 151/55 118/55 119/41 120/49  Pulse: 84 60  51  Temp: 100.5 F (38.1 C) 99.1 F (37.3 C)    TempSrc: Oral Oral    Resp: 20 20  20   Height:      Weight:      SpO2: 100% 99%  99%   Weight change:   Intake/Output Summary (Last 24 hours) at 09/02/14 1334 Last data filed at 09/02/14 0959  Gross per 24 hour  Intake   1200 ml  Output    700 ml  Net    500 ml   Vitals reviewed. General: resting in bed, NAD HEENT: PERRL, EOMI, no scleral icterus Cardiac: RRR, no rubs, murmurs or gallops Pulm: clear to auscultation bilaterally, no wheezes, rales, or rhonchi Abd: soft, nontender, nondistended, BS present Ext: warm and well perfused, 1+ non pitting edmea both legs, left slightly larger than right, non TTP.  Neuro: alert and oriented X3, cranial nerves II-XII grossly intact, strength and sensation to light touch equal in bilateral upper and lower extremities  Lab Results: Basic Metabolic Panel:  Recent Labs Lab 09/01/14 1438 09/02/14 0530  NA 147* 146  K 3.8 3.6*  CL 109 111  CO2 28 26  GLUCOSE 121* 125*  BUN 22 21  CREATININE 0.97 1.05  CALCIUM 8.8 8.2*   Liver Function Tests:  Recent Labs Lab 09/01/14 1438  AST 20  ALT 26  ALKPHOS 62  BILITOT 1.0  PROT 6.8  ALBUMIN 3.5   No results for input(s): LIPASE, AMYLASE in the last 168 hours. No results for input(s): AMMONIA in the last 168 hours. CBC:  Recent Labs Lab 09/01/14 1438 09/02/14 0530  WBC 5.0 3.0*  NEUTROABS 4.0 1.5*  HGB 11.3* 9.3*  HCT 36.5* 31.1*  MCV 83.1 84.3  PLT 189 160   Cardiac Enzymes: No results for input(s): CKTOTAL, CKMB, CKMBINDEX, TROPONINI in the last 168  hours. BNP: No results for input(s): PROBNP in the last 168 hours. D-Dimer: No results for input(s): DDIMER in the last 168 hours. CBG:  Recent Labs Lab 09/01/14 1401 09/01/14 1705 09/01/14 2156 09/02/14 0800 09/02/14 1155  GLUCAP 106* 129* 132* 117* 123*   Hemoglobin A1C:  Recent Labs Lab 09/01/14 1411  HGBA1C 5.9   Fasting Lipid Panel: No results for input(s): CHOL, HDL, LDLCALC, TRIG, CHOLHDL, LDLDIRECT in the last 168 hours. Thyroid Function Tests: No results for input(s): TSH, T4TOTAL, FREET4, T3FREE, THYROIDAB in the last 168 hours. Coagulation: No results for input(s): LABPROT, INR in the last 168 hours. Anemia Panel: No results for input(s): VITAMINB12, FOLATE, FERRITIN, TIBC, IRON, RETICCTPCT in the last 168 hours. Urine Drug Screen: Drugs of Abuse     Component Value Date/Time   LABOPIA NONE DETECTED 08/06/2012 1900   LABOPIA NEGATIVE 03/11/2009 0532   COCAINSCRNUR NONE DETECTED 08/06/2012 1900   COCAINSCRNUR NEGATIVE 03/11/2009 0532   LABBENZ NONE DETECTED 08/06/2012 1900   LABBENZ NEGATIVE 03/11/2009 0532   AMPHETMU NONE DETECTED 08/06/2012 1900   AMPHETMU NEGATIVE 03/11/2009 0532   THCU NONE DETECTED 08/06/2012 1900   LABBARB NONE DETECTED 08/06/2012 1900    Alcohol Level: No results for input(s): ETH in the last 168 hours.  Urinalysis: No results for input(s): COLORURINE, LABSPEC, PHURINE, GLUCOSEU, HGBUR, BILIRUBINUR, KETONESUR, PROTEINUR, UROBILINOGEN, NITRITE, LEUKOCYTESUR in the last 168 hours.  Invalid input(s): APPERANCEUR Misc. Labs:   Micro Results: No results found for this or any previous visit (from the past 240 hour(s)). Studies/Results: X-ray Chest Pa And Lateral  09/02/2014   CLINICAL DATA:  Fever and cough  EXAM: CHEST  2 VIEW  COMPARISON:  03/15/2013  FINDINGS: Cardiac shadow is stable. The lungs are well aerated bilaterally. No focal infiltrate or sizable effusion is seen. No acute bony abnormality is noted.  IMPRESSION: No  active cardiopulmonary disease.   Electronically Signed   By: Inez Catalina M.D.   On: 09/02/2014 00:14   Medications: I have reviewed the patient's current medications. Scheduled Meds: . amLODipine  10 mg Oral Daily  . carvedilol  25 mg Oral BID  . clopidogrel  75 mg Oral Daily  . donepezil  10 mg Oral QHS  . enoxaparin (LOVENOX) injection  40 mg Subcutaneous Q24H  . insulin aspart  0-9 Units Subcutaneous TID WC  . lisinopril  40 mg Oral Daily  . Memantine HCl ER  1 capsule Oral Daily  . simvastatin  20 mg Oral QHS   Continuous Infusions:  PRN Meds:.acetaminophen Assessment/Plan: Active Problems:   Weakness generalized 73 yo male with CVA 2011, DM diet controlled, HLD here with fever and generalized weakness.  Fever with generalized weakness - unclear source.Marland Kitchen DVT is one source, no URI symptoms but several family member did have some URI symptoms. No confusion or LOC. No white count on CBC (low, normal diff with 1.5k neutrophils). No dysuria, no prostitis symptoms. No sacral ulcers. No sinus tenderness. - CXR normal. UA pending, bcx pending, tylenol prn for fever - influenza negative  HLD - cont statin  DM - diet controlled. Monitor  HTN - on coreg 25mg  BID, amlodopine 10mg  daily. Cont these.   Dementia - likely alzeimer's or vascular. Cont memantime and aricept.  Hx cva - cont plavix 75mg  daily.  Dispo: Disposition is deferred at this time, awaiting improvement of current medical problems.  Anticipated discharge in approximately 1-2 day(s).   The patient does have a current PCP (Aldine Contes, MD) and does need an [facility] hospital follow-up appointment after discharge.  The patient does have transportation limitations that hinder transportation to clinic appointments.  .Services Needed at time of discharge: Y = Yes, Blank = No PT:   OT:   RN:   Equipment:   Other:     LOS: 1 day   Dellia Nims, MD 09/02/2014, 1:34 PM

## 2014-09-02 NOTE — Progress Notes (Signed)
Per MD, in and out cath for UA sample. RN in and out cath pt, but unable to retrieve any urine. Per pt, he has had difficulty with catheter insertions in the past. MD has been paged and notified, will attempt to get a clean urine sample from new condom cath.

## 2014-09-03 DIAGNOSIS — R531 Weakness: Secondary | ICD-10-CM | POA: Diagnosis not present

## 2014-09-03 LAB — BASIC METABOLIC PANEL
Anion gap: 11 (ref 5–15)
BUN: 17 mg/dL (ref 6–23)
CHLORIDE: 108 meq/L (ref 96–112)
CO2: 26 meq/L (ref 19–32)
CREATININE: 0.99 mg/dL (ref 0.50–1.35)
Calcium: 8.6 mg/dL (ref 8.4–10.5)
GFR calc non Af Amer: 80 mL/min — ABNORMAL LOW (ref >90)
Glucose, Bld: 99 mg/dL (ref 70–99)
POTASSIUM: 3.6 meq/L — AB (ref 3.7–5.3)
Sodium: 145 mEq/L (ref 137–147)

## 2014-09-03 LAB — CBC WITH DIFFERENTIAL/PLATELET
BASOS PCT: 0 % (ref 0–1)
Basophils Absolute: 0 10*3/uL (ref 0.0–0.1)
Eosinophils Absolute: 0.1 10*3/uL (ref 0.0–0.7)
Eosinophils Relative: 3 % (ref 0–5)
HEMATOCRIT: 31.5 % — AB (ref 39.0–52.0)
HEMOGLOBIN: 9.9 g/dL — AB (ref 13.0–17.0)
LYMPHS PCT: 28 % (ref 12–46)
Lymphs Abs: 1.4 10*3/uL (ref 0.7–4.0)
MCH: 26.3 pg (ref 26.0–34.0)
MCHC: 31.4 g/dL (ref 30.0–36.0)
MCV: 83.8 fL (ref 78.0–100.0)
MONO ABS: 0.6 10*3/uL (ref 0.1–1.0)
Monocytes Relative: 11 % (ref 3–12)
NEUTROS ABS: 3 10*3/uL (ref 1.7–7.7)
NEUTROS PCT: 58 % (ref 43–77)
Platelets: 158 10*3/uL (ref 150–400)
RBC: 3.76 MIL/uL — ABNORMAL LOW (ref 4.22–5.81)
RDW: 15.9 % — ABNORMAL HIGH (ref 11.5–15.5)
WBC: 5.1 10*3/uL (ref 4.0–10.5)

## 2014-09-03 LAB — URINE CULTURE
COLONY COUNT: NO GROWTH
CULTURE: NO GROWTH

## 2014-09-03 LAB — GLUCOSE, CAPILLARY
GLUCOSE-CAPILLARY: 108 mg/dL — AB (ref 70–99)
GLUCOSE-CAPILLARY: 113 mg/dL — AB (ref 70–99)
Glucose-Capillary: 154 mg/dL — ABNORMAL HIGH (ref 70–99)

## 2014-09-03 MED ORDER — POTASSIUM CHLORIDE CRYS ER 20 MEQ PO TBCR
40.0000 meq | EXTENDED_RELEASE_TABLET | Freq: Once | ORAL | Status: AC
  Administered 2014-09-03: 40 meq via ORAL
  Filled 2014-09-03: qty 2

## 2014-09-03 NOTE — Plan of Care (Signed)
Problem: Phase I Progression Outcomes Goal: OOB as tolerated unless otherwise ordered Outcome: Completed/Met Date Met:  09/03/14 Goal: Initial discharge plan identified Outcome: Completed/Met Date Met:  09/03/14 Goal: Hemodynamically stable Outcome: Completed/Met Date Met:  09/03/14 Goal: Other Phase I Outcomes/Goals Outcome: Not Applicable Date Met:  57/26/20  Problem: Phase II Progression Outcomes Goal: Progress activity as tolerated unless otherwise ordered Outcome: Completed/Met Date Met:  09/03/14 Goal: Discharge plan established Outcome: Completed/Met Date Met:  09/03/14 Goal: Vital signs remain stable Outcome: Completed/Met Date Met:  09/03/14 Goal: Other Phase II Outcomes/Goals Outcome: Not Applicable Date Met:  35/59/74  Problem: Phase III Progression Outcomes Goal: Activity at appropriate level-compared to baseline (UP IN CHAIR FOR HEMODIALYSIS)  Outcome: Completed/Met Date Met:  09/03/14 Goal: IV/normal saline lock discontinued Outcome: Completed/Met Date Met:  09/03/14 Goal: Discharge plan remains appropriate-arrangements made Outcome: Completed/Met Date Met:  09/03/14 Goal: Other Phase III Outcomes/Goals Outcome: Not Applicable Date Met:  16/38/45

## 2014-09-03 NOTE — Progress Notes (Signed)
UR completed 

## 2014-09-03 NOTE — Discharge Summary (Signed)
Name: [name] MRN: [id number] DOB: 15-Apr-1941 73 y.o. PCP: Aldine Contes, MD  Date of Admission: 09/01/2014  4:44 PM Date of Discharge: 09/03/2014 Attending Physician: Axel Filler, MD  Discharge Diagnosis: Active Problems:   Weakness generalized  Discharge Medications:   Medication List    TAKE these medications        amLODipine 10 MG tablet  Commonly known as:  NORVASC  Take 1 tablet (10 mg total) by mouth daily.     carvedilol 25 MG tablet  Commonly known as:  COREG  Take 1 tablet (25 mg total) by mouth 2 (two) times daily.     clopidogrel 75 MG tablet  Commonly known as:  PLAVIX  Take 1 tablet (75 mg total) by mouth daily.     donepezil 10 MG tablet  Commonly known as:  ARICEPT  Take 1 tablet (10 mg total) by mouth at bedtime.     LIQUID ANTACID PO  Take 30 mLs by mouth every 4 (four) hours as needed (for indegestion).     lisinopril 40 MG tablet  Commonly known as:  PRINIVIL,ZESTRIL  Take 1 tablet (40 mg total) by mouth daily.     Memantine HCl ER 28 MG Cp24  Commonly known as:  NAMENDA XR  Take 28 mg by mouth daily.     NATURAL BALANCE TEARS 0.4 % Soln  Generic drug:  Hypromellose  Apply 2 drops to eye 3 (three) times daily.     simvastatin 20 MG tablet  Commonly known as:  ZOCOR  Take 1 tablet (20 mg total) by mouth at bedtime.        Disposition and follow-up:   [name] was discharged from [facility] in Stable condition.  At the hospital follow up visit please address:  1.  Please check if he continues to have fever. We did not find any source of fever in the hospital and he had been afebrile since the night of admission here. DVT ruled out. No skin lesions. No UTI, Pneumonia, or URI symptoms. Bcx and ucx ngtd. ua negative. Influenza negative.  2.  Labs / imaging needed at time of follow-up:   3.  Pending labs/ test needing follow-up: bcx.  Follow-up Appointments: Follow-up Information    Follow up with Jacques Earthly, MD. Go on 09/06/2014.   Specialty:  Internal Medicine   Why:  At 1:30 PM   Contact information:   [place] [address] [phone]       Discharge Instructions:   Consultations:    Procedures Performed:  X-ray Chest Pa And Lateral  09/02/2014   CLINICAL DATA:  Fever and cough  EXAM: CHEST  2 VIEW  COMPARISON:  03/15/2013  FINDINGS: Cardiac shadow is stable. The lungs are well aerated bilaterally. No focal infiltrate or sizable effusion is seen. No acute bony abnormality is noted.  IMPRESSION: No active cardiopulmonary disease.   Electronically Signed   By: Inez Catalina M.D.   On: 09/02/2014 00:14    2D Echo:   Cardiac Cath:   Admission HPI:   73 yo male with CVA 2011, CHF, DM (diet controlled), HLD here with fever and generalized weakness since today. Patient woke up this morning and felt generalized weakness, mainly on both legs. When home PT came, he wasn't able to participate with them due to his weakness. Temp was checked by home health nruse and was 101.8. He usually uses walker but today he wasn't able  to get out of bed because of weakness. He denies any numbness ,tingling, focal weakness, vision changes. No chest pain, sob. His family members recently have viral gastro and URI symptoms. He denies any diarrhea/n/v.   Has recently noticed left leg swelling, which is wrapped with dressing. No wounds, just increased swelling, more compared to the right. He does not keep his leg elevated most of the time. Denies any dysuria, recent travel. Has remote hx of blood clot. Not sure of previous location.   Hospital Course by problem list: Active Problems:   Weakness generalized   73 yo male with CVA 2011, DM diet controlled, HLD here with fever and generalized weakness.  Fever with generalized weakness -came in with fever from home but has been afebrile most of the time in the hospital except for the night of admission. unclear source.  Ruled out DVT with doppler. No skin wounds, no URI symptoms but several family member did have some URI symptoms. No confusion or LOC. No white count. No dysuria, no prostitis symptoms. No sacral ulcers. No sinus tenderness. CRP normal, ESR normal. Patient is doing totally fine since admission.  - CXR normal. UA normal, bcx NGTD influenza NGTD - not sure what was the cause of the fever. Could have been some kind of viral infection which resolved. - will discharge home today since he is doing well. F/up at clinic if symptoms worsen.  HLD - cont statin  DM - diet controlled. Monitor  HTN - on coreg 37m BID, lisinopril, amlodopine 133mdaily. Cont these.   Dementia - likely alzeimer's or vascular. Cont memantime and aricept.  Hx cva - cont plavix 7513maily.   Discharge Vitals:   BP 155/65 mmHg  Pulse 50  Temp(Src) 97.8 F (36.6 C) (Oral)  Resp 20  Ht 5' 5" (1.651 m)  Wt 89.268 kg (196 lb 12.8 oz)  BMI 32.75 kg/m2  SpO2 97%  Discharge Labs:  Results for orders placed or performed during the hospital encounter of 09/01/14 (from the past 24 hour(s))  Urinalysis, Routine w reflex microscopic     Status: Abnormal   Collection Time: 09/02/14  3:31 PM  Result Value Ref Range   Color, Urine AMBER (A) YELLOW   APPearance CLEAR CLEAR   Specific Gravity, Urine 1.030 1.005 - 1.030   pH 5.5 5.0 - 8.0   Glucose, UA NEGATIVE NEGATIVE mg/dL   Hgb urine dipstick NEGATIVE NEGATIVE   Bilirubin Urine SMALL (A) NEGATIVE   Ketones, ur 15 (A) NEGATIVE mg/dL   Protein, ur NEGATIVE NEGATIVE mg/dL   Urobilinogen, UA 1.0 0.0 - 1.0 mg/dL   Nitrite NEGATIVE NEGATIVE   Leukocytes, UA NEGATIVE NEGATIVE  Glucose, capillary     Status: Abnormal   Collection Time: 09/02/14  5:18 PM  Result Value Ref Range   Glucose-Capillary 133 (H) 70 - 99 mg/dL  Glucose, capillary     Status: Abnormal   Collection Time: 09/02/14 10:00 PM  Result Value Ref Range   Glucose-Capillary 134 (H) 70 - 99 mg/dL  CBC with  Differential     Status: Abnormal   Collection Time: 09/03/14  5:24 AM  Result Value Ref Range   WBC 5.1 4.0 - 10.5 K/uL   RBC 3.76 (L) 4.22 - 5.81 MIL/uL   Hemoglobin 9.9 (L) 13.0 - 17.0 g/dL   HCT 31.5 (L) 39.0 - 52.0 %   MCV 83.8 78.0 - 100.0 fL   MCH 26.3 26.0 - 34.0 pg   MCHC 31.4 30.0 -  36.0 g/dL   RDW 15.9 (H) 11.5 - 15.5 %   Platelets 158 150 - 400 K/uL   Neutrophils Relative % 58 43 - 77 %   Neutro Abs 3.0 1.7 - 7.7 K/uL   Lymphocytes Relative 28 12 - 46 %   Lymphs Abs 1.4 0.7 - 4.0 K/uL   Monocytes Relative 11 3 - 12 %   Monocytes Absolute 0.6 0.1 - 1.0 K/uL   Eosinophils Relative 3 0 - 5 %   Eosinophils Absolute 0.1 0.0 - 0.7 K/uL   Basophils Relative 0 0 - 1 %   Basophils Absolute 0.0 0.0 - 0.1 K/uL  Basic metabolic panel     Status: Abnormal   Collection Time: 09/03/14  5:24 AM  Result Value Ref Range   Sodium 145 137 - 147 mEq/L   Potassium 3.6 (L) 3.7 - 5.3 mEq/L   Chloride 108 96 - 112 mEq/L   CO2 26 19 - 32 mEq/L   Glucose, Bld 99 70 - 99 mg/dL   BUN 17 6 - 23 mg/dL   Creatinine, Ser 0.99 0.50 - 1.35 mg/dL   Calcium 8.6 8.4 - 10.5 mg/dL   GFR calc non Af Amer 80 (L) >90 mL/min   GFR calc Af Amer >90 >90 mL/min   Anion gap 11 5 - 15  Glucose, capillary     Status: Abnormal   Collection Time: 09/03/14  8:22 AM  Result Value Ref Range   Glucose-Capillary 108 (H) 70 - 99 mg/dL  Glucose, capillary     Status: Abnormal   Collection Time: 09/03/14 11:39 AM  Result Value Ref Range   Glucose-Capillary 113 (H) 70 - 99 mg/dL    Signed: Dellia Nims, MD 09/03/2014, 2:09 PM    Services Ordered on Discharge:  Equipment Ordered on Discharge:

## 2014-09-03 NOTE — Progress Notes (Signed)
Lewis Moccasin to be D/C'd Home per MD order.  Discussed with the patient and all questions fully answered.    Medication List    TAKE these medications        amLODipine 10 MG tablet  Commonly known as:  NORVASC  Take 1 tablet (10 mg total) by mouth daily.     carvedilol 25 MG tablet  Commonly known as:  COREG  Take 1 tablet (25 mg total) by mouth 2 (two) times daily.     clopidogrel 75 MG tablet  Commonly known as:  PLAVIX  Take 1 tablet (75 mg total) by mouth daily.     donepezil 10 MG tablet  Commonly known as:  ARICEPT  Take 1 tablet (10 mg total) by mouth at bedtime.     LIQUID ANTACID PO  Take 30 mLs by mouth every 4 (four) hours as needed (for indegestion).     lisinopril 40 MG tablet  Commonly known as:  PRINIVIL,ZESTRIL  Take 1 tablet (40 mg total) by mouth daily.     Memantine HCl ER 28 MG Cp24  Commonly known as:  NAMENDA XR  Take 28 mg by mouth daily.     NATURAL BALANCE TEARS 0.4 % Soln  Generic drug:  Hypromellose  Apply 2 drops to eye 3 (three) times daily.     simvastatin 20 MG tablet  Commonly known as:  ZOCOR  Take 1 tablet (20 mg total) by mouth at bedtime.        VVS, Skin clean, dry and intact without evidence of skin break down, no evidence of skin tears noted. IV catheter discontinued intact. Site without signs and symptoms of complications. Dressing and pressure applied.  An After Visit Summary was printed and given to the patient.  D/c education completed with patient/family including follow up instructions, medication list, d/c activities limitations if indicated, with other d/c instructions as indicated by MD - patient able to verbalize understanding, all questions fully answered.   Patient instructed to return to ED, call 911, or call MD for any changes in condition.   Patient escorted via [place], and D/C home via private auto.  Audria Nine F 09/03/2014 7:00 PM

## 2014-09-03 NOTE — Discharge Instructions (Signed)
You were admitted with a fever and generalized weakness. Our workup has been all negative. Your fever improved in the hospital without any need for antibiotics on the very first night.   You are doing well now without anything concerning. If you feel sick and have a fever again please call our clinic or come back to the ED.

## 2014-09-03 NOTE — Progress Notes (Signed)
 Subjective: Doing well. No cough, afebrile currently even without tylenol.  Last fever recorded 2 nights ago. No URI symptoms. No chest pain/sob, dysuria. No pain on left leg. Weakness has resolved on legs. Fever workup all negative. DVT negative.  Objective: Vital signs in last 24 hours: Filed Vitals:   09/02/14 1016 09/02/14 1318 09/02/14 2100 09/03/14 0625  BP: 119/41 120/49 164/65 136/54  Pulse:  51 62 50  Temp:  98.5 F (36.9 C) 99.8 F (37.7 C) 97.8 F (36.6 C)  TempSrc:  Oral Oral Oral  Resp:  20 22 20  Height:      Weight:      SpO2:  99% 97% 97%   Weight change:   Intake/Output Summary (Last 24 hours) at 09/03/14 0726 Last data filed at 09/03/14 0500  Gross per 24 hour  Intake    560 ml  Output   1800 ml  Net  -1240 ml   Vitals reviewed. General: resting in bed, NAD HEENT: PERRL, EOMI, no scleral icterus Cardiac: RRR, no rubs, murmurs or gallops Pulm: clear to auscultation bilaterally, no wheezes, rales, or rhonchi Abd: soft, nontender, nondistended, BS present Ext: warm and well perfused, 1+ non pitting edmea both legs, left slightly larger than right, non TTP.  Neuro: alert and oriented X3, cranial nerves II-XII grossly intact, strength and sensation to light touch equal in bilateral upper and lower extremities  Lab Results: Basic Metabolic Panel:  Recent Labs Lab 09/02/14 0530 09/03/14 0524  NA 146 145  K 3.6* 3.6*  CL 111 108  CO2 26 26  GLUCOSE 125* 99  BUN 21 17  CREATININE 1.05 0.99  CALCIUM 8.2* 8.6   Liver Function Tests:  Recent Labs Lab 09/01/14 1438  AST 20  ALT 26  ALKPHOS 62  BILITOT 1.0  PROT 6.8  ALBUMIN 3.5   No results for input(s): LIPASE, AMYLASE in the last 168 hours. No results for input(s): AMMONIA in the last 168 hours. CBC:  Recent Labs Lab 09/02/14 0530 09/03/14 0524  WBC 3.0* 5.1  NEUTROABS 1.5* 3.0  HGB 9.3* 9.9*  HCT 31.1* 31.5*  MCV 84.3 83.8  PLT 160 158   Cardiac Enzymes: No results for  input(s): CKTOTAL, CKMB, CKMBINDEX, TROPONINI in the last 168 hours. BNP: No results for input(s): PROBNP in the last 168 hours. D-Dimer: No results for input(s): DDIMER in the last 168 hours. CBG:  Recent Labs Lab 09/01/14 1705 09/01/14 2156 09/02/14 0800 09/02/14 1155 09/02/14 1718 09/02/14 2200  GLUCAP 129* 132* 117* 123* 133* 134*   Hemoglobin A1C:  Recent Labs Lab 09/01/14 1411  HGBA1C 5.9   Fasting Lipid Panel: No results for input(s): CHOL, HDL, LDLCALC, TRIG, CHOLHDL, LDLDIRECT in the last 168 hours. Thyroid Function Tests: No results for input(s): TSH, T4TOTAL, FREET4, T3FREE, THYROIDAB in the last 168 hours. Coagulation: No results for input(s): LABPROT, INR in the last 168 hours. Anemia Panel: No results for input(s): VITAMINB12, FOLATE, FERRITIN, TIBC, IRON, RETICCTPCT in the last 168 hours. Urine Drug Screen: Drugs of Abuse     Component Value Date/Time   LABOPIA NONE DETECTED 08/06/2012 1900   LABOPIA NEGATIVE 03/11/2009 0532   COCAINSCRNUR NONE DETECTED 08/06/2012 1900   COCAINSCRNUR NEGATIVE 03/11/2009 0532   LABBENZ NONE DETECTED 08/06/2012 1900   LABBENZ NEGATIVE 03/11/2009 0532   AMPHETMU NONE DETECTED 08/06/2012 1900   AMPHETMU NEGATIVE 03/11/2009 0532   THCU NONE DETECTED 08/06/2012 1900   LABBARB NONE DETECTED 08/06/2012 1900    Alcohol Level:   No results for input(s): ETH in the last 168 hours. Urinalysis:  Recent Labs Lab 09/02/14 1531  COLORURINE AMBER*  LABSPEC 1.030  PHURINE 5.5  GLUCOSEU NEGATIVE  HGBUR NEGATIVE  BILIRUBINUR SMALL*  KETONESUR 15*  PROTEINUR NEGATIVE  UROBILINOGEN 1.0  NITRITE NEGATIVE  LEUKOCYTESUR NEGATIVE   Misc. Labs:   Micro Results: No results found for this or any previous visit (from the past 240 hour(s)). Studies/Results: X-ray Chest Pa And Lateral  09/02/2014   CLINICAL DATA:  Fever and cough  EXAM: CHEST  2 VIEW  COMPARISON:  03/15/2013  FINDINGS: Cardiac shadow is stable. The lungs are  well aerated bilaterally. No focal infiltrate or sizable effusion is seen. No acute bony abnormality is noted.  IMPRESSION: No active cardiopulmonary disease.   Electronically Signed   By: Inez Catalina M.D.   On: 09/02/2014 00:14   Medications: I have reviewed the patient's current medications. Scheduled Meds: . amLODipine  10 mg Oral Daily  . carvedilol  25 mg Oral BID  . clopidogrel  75 mg Oral Daily  . donepezil  10 mg Oral QHS  . enoxaparin (LOVENOX) injection  40 mg Subcutaneous Q24H  . insulin aspart  0-9 Units Subcutaneous TID WC  . lisinopril  40 mg Oral Daily  . Memantine HCl ER  1 capsule Oral Daily  . simvastatin  20 mg Oral QHS   Continuous Infusions:  PRN Meds:. Assessment/Plan: Active Problems:   Weakness generalized 73 yo male with CVA 2011, DM diet controlled, HLD here with fever and generalized weakness.  Fever with generalized weakness - unclear source. Ruled out DVT with doppler. No skin wounds, no URI symptoms but several family member did have some URI symptoms. No confusion or LOC. No white count. No dysuria, no prostitis symptoms. No sacral ulcers. No sinus tenderness. CRP normal, ESR normal. Patient is doing totally fine since admission.  - CXR normal. UA normal, bcx NGTD influenza NGTD - not sure what was the cause of the fever. Could have been some kind of viral infection which resolved. - will discharge home today since he is doing well. F/up at clinic if symptoms worsen.  HLD - cont statin  DM - diet controlled. Monitor  HTN - on coreg 74m BID, amlodopine 131mdaily. Cont these.   Dementia - likely alzeimer's or vascular. Cont memantime and aricept.  Hx cva - cont plavix 7568maily.  Dispo: Disposition is deferred at this time, awaiting improvement of current medical problems.  Anticipated discharge in approximately 1-2 day(s).   The patient does have a current PCP (NisAldine ContesD) and [facility]  discharge.  The patient does have transportation limitations that hinder transportation to clinic appointments.  .Services Needed at time of discharge: Y = Yes, Blank = No PT:   OT:   RN:   Equipment:   Other:     LOS: 2 days   TasDellia NimsD 09/03/2014, 7:26 AM

## 2014-09-03 NOTE — Progress Notes (Signed)
Subjective:  Patient still reports no fevers since the day of his admission. He denies cp, shob, HA, dizziness, or dysuria. He said that when he woke up he had some numbness in his 4th and 5th digits of his right hand, but it resolved after his armband was loosened. He denies any facial weakness/drooping, arm or hand weakness, or new onset weakness.  Objective: Vital signs in last 24 hours: Filed Vitals:   09/02/14 1016 09/02/14 1318 09/02/14 2100 09/03/14 0625  BP: 119/41 120/49 164/65 136/54  Pulse:  51 62 50  Temp:  98.5 F (36.9 C) 99.8 F (37.7 C) 97.8 F (36.6 C)  TempSrc:  Oral Oral Oral  Resp:  20 22 20   Height:      Weight:      SpO2:  99% 97% 97%    Intake/Output Summary (Last 24 hours) at 09/03/14 0754 Last data filed at 09/03/14 0500  Gross per 24 hour  Intake    560 ml  Output   1800 ml  Net  -1240 ml   BP 136/54 mmHg  Pulse 50  Temp(Src) 97.8 F (36.6 C) (Oral)  Resp 20  Ht 5\' 5"  (1.651 m)  Wt 89.268 kg (196 lb 12.8 oz)  BMI 32.75 kg/m2  SpO2 97%   Vital signs reviewed and are stable. General appearance: alert, cooperative and no distress Eyes: conjunctivae/corneas clear. PERRL, EOM's intact. Fundi benign. Throat: normal findings: tongue midline and normal Lungs: clear to auscultation bilaterally Heart: regular rate and rhythm, S1, S2 normal, no murmur, click, rub or gallop Abdomen: soft, non-tender; bowel sounds normal; no masses,  no organomegaly Extremities: no edema, redness or tenderness in the calves or thighs Neurologic: Mental status: Alert, oriented, thought content appropriate Cranial nerves: normal Sensory: normal, no numbness noted in R hand  Motor: 5/5 grip strength b/l   Lab Results: Basic Metabolic Panel:  Recent Labs Lab 09/02/14 0530 09/03/14 0524  NA 146 145  K 3.6* 3.6*  CL 111 108  CO2 26 26  GLUCOSE 125* 99  BUN 21 17  CREATININE 1.05 0.99  CALCIUM 8.2* 8.6   Liver Function Tests:  Recent Labs Lab  09/01/14 1438  AST 20  ALT 26  ALKPHOS 62  BILITOT 1.0  PROT 6.8  ALBUMIN 3.5   CBC:  Recent Labs Lab 09/02/14 0530 09/03/14 0524  WBC 3.0* 5.1  NEUTROABS 1.5* 3.0  HGB 9.3* 9.9*  HCT 31.1* 31.5*  MCV 84.3 83.8  PLT 160 158   CBG:  Recent Labs Lab 09/01/14 1705 09/01/14 2156 09/02/14 0800 09/02/14 1155 09/02/14 1718 09/02/14 2200  GLUCAP 129* 132* 117* 123* 133* 134*   Hemoglobin A1C:  Recent Labs Lab 09/01/14 1411  HGBA1C 5.9   Urine Drug Screen: Drugs of Abuse     Component Value Date/Time   LABOPIA NONE DETECTED 08/06/2012 1900   LABOPIA NEGATIVE 03/11/2009 0532   COCAINSCRNUR NONE DETECTED 08/06/2012 1900   COCAINSCRNUR NEGATIVE 03/11/2009 0532   LABBENZ NONE DETECTED 08/06/2012 1900   LABBENZ NEGATIVE 03/11/2009 0532   AMPHETMU NONE DETECTED 08/06/2012 1900   AMPHETMU NEGATIVE 03/11/2009 0532   THCU NONE DETECTED 08/06/2012 1900   LABBARB NONE DETECTED 08/06/2012 1900    Urinalysis:  Recent Labs Lab 09/02/14 1531  COLORURINE AMBER*  LABSPEC 1.030  PHURINE 5.5  GLUCOSEU NEGATIVE  HGBUR NEGATIVE  BILIRUBINUR SMALL*  KETONESUR 15*  PROTEINUR NEGATIVE  UROBILINOGEN 1.0  NITRITE NEGATIVE  LEUKOCYTESUR NEGATIVE   Micro results:   - BCx:  NGTD x2   - UCx: Pending  Studies/Results:  X-ray Chest Pa And Lateral  09/02/2014   CLINICAL DATA:  Fever and cough  EXAM: CHEST  2 VIEW  COMPARISON:  03/15/2013  FINDINGS: Cardiac shadow is stable. The lungs are well aerated bilaterally. No focal infiltrate or sizable effusion is seen. No acute bony abnormality is noted.  IMPRESSION: No active cardiopulmonary disease.   Electronically Signed   By: Inez Catalina M.D.   On: 09/02/2014 00:14   Lower extremity Venous Duplex:  09/02/2014   Preliminary report: Left: No evidence of DVT, superficial thrombosis, or Baker's cyst.  Medications: I have reviewed the patient's current medications. Scheduled Meds: . amLODipine  10 mg Oral Daily  . carvedilol   25 mg Oral BID  . clopidogrel  75 mg Oral Daily  . donepezil  10 mg Oral QHS  . enoxaparin (LOVENOX) injection  40 mg Subcutaneous Q24H  . insulin aspart  0-9 Units Subcutaneous TID WC  . lisinopril  40 mg Oral Daily  . Memantine HCl ER  1 capsule Oral Daily  . potassium chloride  40 mEq Oral Once  . simvastatin  20 mg Oral QHS   PRN Meds:. Acetaminophen 650mg  Q6H  Assessment/Plan:  Active Problems:   Weakness generalized  73 y.o. Male with PMHx of CVA, diet-controlled DM II, and HLD on HD 2 for fever and generalized weakness  1.) Fever with generalized weakness: No fever since admission date, even after acetaminophen was witheld. Still no clear source. DVT r/o via LLE U/S. No URI sx, but has sick contacts at home. No leukocytosis (5.1). BCx showed NGTD. Patient has been largely asymptomatic for 2 days now.   - UCx pending. Tylenol prn for fevers   - Plan for d/c home today  2.) HLD   - Continue simvastatin 20mg  QD  3.) Hypokalemia: Still low today at 3.6   - Replete with 40 mEq PO  4.) DM II. Diet controlled. A1c is 5.9%. Sugars have been ~130.   - Monitor. No tx necessary at this time.  Dispo: Disposition is deferred at this time, awaiting improvement of current medical problems.  Anticipated discharge in approximately 1-2 day(s).   The patient does have a current PCP (Aldine Contes, MD) and does need an [facility] hospital follow-up appointment after discharge.  The patient does not have transportation limitations that hinder transportation to clinic appointments.  Services Needed at time of discharge: Y = Yes, Blank = No PT:   OT:   RN:   Equipment:   Other:     LOS: 2 days   Farrell Ours, Med Student 09/03/2014, 7:54 AM

## 2014-09-03 NOTE — Evaluation (Signed)
Occupational Therapy Evaluation Patient Details Name: [name] MRN: [id number] DOB: 1941-04-09 Today's Date: 09/03/2014    History of Present Illness pt admitted with fever and weakness.  Patient woke up this morning and felt generalized weakness, mainly on both legs. When home PT came, he wasn't able to participate with them due to his weakness. Temp was checked by home health nruse and was 101.8. He usually uses walker but today he wasn't able to get out of bed because of weakness. He denies any numbness ,tingling, focal weakness, vision changes. No chest pain, sob. His family members recently have viral gastro and URI symptoms. He denies any diarrhea/n/v.   Work up continues   Clinical Impression   Pt was performing ADL at a supervision to modified independent level prior to admission. He was using a cane for ambulation and working with HHPT to improve his balance.  Pt reports he is close to his baseline.  He required hand held assist for transfers and ambulation in the absence of his cane.  He reports that a walker is not practical in his home.  Will follow acutely. Agree with resumption of HHPT to address pt's balance upon discharge.    Follow Up Recommendations  No OT follow up    Equipment Recommendations  None recommended by OT    Recommendations for Other Services       Precautions / Restrictions Precautions Precautions: Fall Restrictions Weight Bearing Restrictions: No      Mobility Bed Mobility   Bed Mobility: Sit to Supine       Sit to supine: Independent      Transfers Overall transfer level: Needs assistance   Transfers: Sit to/from Stand Sit to Stand: Min guard         General transfer comment: uses legs against the bed or chair to stabilize, stands momentarily before ambulating    Balance     Sitting balance-Leahy Scale: Fair       Standing balance-Leahy Scale: Fair                              ADL Overall ADL's :  Needs assistance/impaired Eating/Feeding: Independent;Sitting   Grooming: Wash/dry hands;Standing;Min guard   Upper Body Bathing: Supervision/ safety;Sitting   Lower Body Bathing: Min guard;Sit to/from stand   Upper Body Dressing : Set up;Sitting   Lower Body Dressing: Min guard;Sit to/from stand   Toilet Transfer: Minimal assistance;Comfort height toilet;Ambulation;Grab bars           Functional mobility during ADLs: Minimal assistance (hand held assist)       Vision                     Perception     Praxis      Pertinent Vitals/Pain Pain Assessment: No/denies pain     Hand Dominance Right   Extremity/Trunk Assessment Upper Extremity Assessment Upper Extremity Assessment: Overall WFL for tasks assessed   Lower Extremity Assessment Lower Extremity Assessment: Defer to PT evaluation       Communication Communication Communication: No difficulties   Cognition Arousal/Alertness: Awake/alert Behavior During Therapy: WFL for tasks assessed/performed Overall Cognitive Status: History of cognitive impairments - at baseline (hx of dementia)                     General Comments       Exercises       Shoulder Instructions  Home Living Family/patient expects to be discharged to:: Private residence Living Arrangements: Spouse/significant other;Other relatives Available Help at Discharge: Family;Available PRN/intermittently (pt is alone from 1 pm to 6 pm) Type of Home: House Home Access: Stairs to enter [employer] of Steps: 2 Entrance Stairs-Rails: Left Home Layout: One level     Bathroom Shower/Tub: Teacher, early years/pre: Handicapped height     Home Equipment: Environmental consultant - 2 wheels;Cane - single point;Shower seat;Grab bars - tub/shower;Wheelchair - manual   Additional Comments: pt uses canes in home and should use RW in/out of house but will only use cane      Prior Functioning/Environment Level of  Independence: Independent with assistive device(s)             OT Diagnosis:     OT Problem List: Impaired balance (sitting and/or standing);Decreased cognition;Decreased activity tolerance;Decreased strength   OT Treatment/Interventions:      OT Goals(Current goals can be found in the care plan section) Acute Rehab OT Goals Patient Stated Goal: be safe at home alone for periods of time. OT Goal Formulation: With patient Time For Goal Achievement: 09/10/14 Potential to Achieve Goals: Good ADL Goals Pt Will Perform Grooming: standing;with modified independence Pt Will Transfer to Toilet: ambulating;with modified independence Pt Will Perform Toileting - Clothing Manipulation and hygiene: with modified independence;sit to/from stand Pt Will Perform Tub/Shower Transfer: Tub transfer;with supervision;ambulating;shower seat;grab bars  OT Frequency:     Barriers to D/C:            Co-evaluation              End of Session Nurse Communication:  (wife wants her to call her)  Activity Tolerance: Patient tolerated treatment well Patient left: in bed;with call bell/phone within reach;with bed alarm set   Time: 4098-1191 OT Time Calculation (min): 28 min Charges:  OT General Charges $OT Visit: 1 Procedure OT Evaluation $Initial OT Evaluation Tier I: 1 Procedure OT Treatments $Self Care/Home Management : 8-22 mins G-Codes: OT G-codes **NOT FOR INPATIENT CLASS** Functional Assessment Tool Used: clinical judgement Functional Limitation: Self care Self Care Current Status (Y7829): At least 1 percent but less than 20 percent impaired, limited or restricted Self Care Goal Status (F6213): At least 1 percent but less than 20 percent impaired, limited or restricted  [name] 09/03/2014, 11:03 AM  [phone]

## 2014-09-06 ENCOUNTER — Encounter: Payer: Self-pay | Admitting: Pulmonary Disease

## 2014-09-06 ENCOUNTER — Ambulatory Visit (INDEPENDENT_AMBULATORY_CARE_PROVIDER_SITE_OTHER): Payer: Medicare HMO | Admitting: Pulmonary Disease

## 2014-09-06 VITALS — BP 150/52 | HR 52 | Temp 98.0°F | Ht 65.0 in | Wt 197.0 lb

## 2014-09-06 DIAGNOSIS — E119 Type 2 diabetes mellitus without complications: Secondary | ICD-10-CM

## 2014-09-06 DIAGNOSIS — G4733 Obstructive sleep apnea (adult) (pediatric): Secondary | ICD-10-CM

## 2014-09-06 DIAGNOSIS — I10 Essential (primary) hypertension: Secondary | ICD-10-CM

## 2014-09-06 DIAGNOSIS — R609 Edema, unspecified: Secondary | ICD-10-CM

## 2014-09-06 DIAGNOSIS — R509 Fever, unspecified: Secondary | ICD-10-CM

## 2014-09-06 NOTE — Progress Notes (Signed)
Internal Medicine Clinic Attending  I saw and evaluated the patient.  I personally confirmed the key portions of the history and exam documented by Dr. Krall and I reviewed pertinent patient test results.  The assessment, diagnosis, and plan were formulated together and I agree with the documentation in the resident's note.  

## 2014-09-06 NOTE — Assessment & Plan Note (Signed)
Assessment: Has not been on CPAP because machine is broken  Plan: Will aid patient in obtaining a new machine.

## 2014-09-06 NOTE — Assessment & Plan Note (Addendum)
Lab Results  Component Value Date   HGBA1C 5.9 09/01/2014   HGBA1C 6.0 06/08/2014   HGBA1C 6.7 01/13/2014     Assessment: Diabetes control: good control (HgbA1C at goal) Progress toward A1C goal:  at goal   Foot exam performed today - no lesions identified.  Plan: Medications:  Continue diet control Other plans:  -Referral made to Dr. Webb Laws who usually does [name]'s retinal exams. -Follow up in 6 months for reassessment of Diabetes control

## 2014-09-06 NOTE — Patient Instructions (Signed)
Please follow up in about 6 months.   General Instructions:   Please bring your medicines with you each time you come to clinic.  Medicines may include prescription medications, over-the-counter medications, herbal remedies, eye drops, vitamins, or other pills.   Progress Toward Treatment Goals:  Treatment Goal 09/06/2014  Hemoglobin A1C at goal  Blood pressure improved  Prevent falls -    Self Care Goals & Plans:  Self Care Goal 09/06/2014  Manage my medications take my medicines as prescribed; bring my medications to every visit  Monitor my health -  Eat healthy foods drink diet soda or water instead of juice or soda; eat foods that are low in salt; eat baked foods instead of fried foods  Be physically active find an activity I enjoy  Meeting treatment goals maintain the current self-care plan    Home Blood Glucose Monitoring 07/22/2014  Check my blood sugar once a day  When to check my blood sugar before breakfast

## 2014-09-06 NOTE — Assessment & Plan Note (Signed)
Assessment: Recently hospitalized from 09/01/2014 to 09/03/2014 for fever and generalized weakness. CXR was negative for acute cardiopulmonary disease. UA and UCx negative for infection. Blood cultures no growth. Negative for influenza. Resolved.  Plan: No recurrence since discharge. Resolved.

## 2014-09-06 NOTE — Assessment & Plan Note (Addendum)
BP Readings from Last 3 Encounters:  09/06/14 150/52  09/03/14 155/57  09/01/14 151/74    Lab Results  Component Value Date   NA 145 09/03/2014   K 3.6* 09/03/2014   CREATININE 0.99 09/03/2014    Assessment: Blood pressure control: mildly elevated Progress toward BP goal:  improved  Plan: Medications:  continue current medications including amlodipine 10mg  daily, carvedilol 25mg  BID, lisinopril 40mg  daily.

## 2014-09-06 NOTE — Progress Notes (Signed)
Subjective:   Patient ID: [name], male    DOB: 11/06/1940, 73 y.o.   MRN: [id number]  HPI [name] is a 73 year old man with history of CVA on Plavix 75mg  daily, HTN, DM2, OSA presenting for hospital follow up.  Recently hospitalized from 09/01/2014 to 09/03/2014 for fever and generalized weakness. CXR was negative for acute cardiopulmonary disease. UA negative for infection. Blood cultures no growth. Negative for influenza.  Feels better overall. No fevers since then. Feels like weakness is better. Can walk from bedroom to bathroom - improved from prior to hospitalization. PT will start back this week.   LE edema: L leg swelling noted during hospitalization. Chronic but it had gotten a lot worse prior to admission. Remote history of blood clot. No DVT found on venous duplex 09/02/2014. His wife reports that it is back to baseline. Has not been wrapping it.   HTN: no episodes of LH, dizziness. Compliant on medications.  DM2: continues to be diet controlled  OSA: had not restarted CPAP because motor is broken. Wife states he needs to restart this.  Review of Systems  Constitutional: Negative for fever and chills.  HENT: Negative for congestion and sore throat.   Eyes: Negative for visual disturbance.  Respiratory: Negative for cough and shortness of breath.   Cardiovascular: Positive for leg swelling (returned to baseline). Negative for chest pain and palpitations.  Gastrointestinal: Negative for nausea, vomiting, abdominal pain and diarrhea.  Endocrine: Negative for polydipsia.  Genitourinary: Negative for dysuria and hematuria.  Musculoskeletal: Positive for arthralgias (chronic knee arthritis). Negative for myalgias and joint swelling.  Skin: Negative for rash.  Neurological: Negative for weakness (generalized, improved greatly), light-headedness, numbness and headaches.  Hematological: Does not bruise/bleed easily.   Past Medical History  Diagnosis Date    . Hypertension   . 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 TIA (transient ischemic attack)     multiple in past, most recently in 2011  . CHF (congestive heart failure)   . Depression   . Hyperlipemia   . Stroke 2009    left thalamic infarct, residual LUE weakness  . Type II diabetes mellitus   . Sleep apnea     "my machine [place]; got to get me another one" (09/01/2014)  . Arthritis     "maybe"   Current Outpatient Prescriptions on File Prior to Visit  Medication Sig Dispense Refill  . Aluminum & Magnesium Hydroxide (LIQUID ANTACID PO) Take 30 mLs by mouth every 4 (four) hours as needed (for indegestion).     Marland Kitchen amLODipine (NORVASC) 10 MG tablet Take 1 tablet (10 mg total) by mouth daily. 90 tablet 3  . carvedilol (COREG) 25 MG tablet Take 1 tablet (25 mg total) by mouth 2 (two) times daily. 60 tablet 11  . clopidogrel (PLAVIX) 75 MG tablet Take 1 tablet (75 mg total) by mouth daily. 30 tablet 11  . donepezil (ARICEPT) 10 MG tablet Take 1 tablet (10 mg total) by mouth at bedtime. 30 tablet 5  . Hypromellose (NATURAL BALANCE TEARS) 0.4 % SOLN Apply 2 drops to eye 3 (three) times daily.    Marland Kitchen lisinopril (PRINIVIL,ZESTRIL) 40 MG tablet Take 1 tablet (40 mg total) by mouth daily. 30 tablet 11  . Memantine HCl ER (NAMENDA XR) 28 MG CP24 Take 28 mg by mouth daily. 30 capsule 5  . simvastatin (ZOCOR)  20 MG tablet Take 1 tablet (20 mg total) by mouth at bedtime. 30 tablet 11   No current facility-administered medications on file prior to visit.   Today's Vitals   09/06/14 1351  BP: 154/52  Pulse: 55  Temp: 98 F (36.7 C)  TempSrc: Oral  Height: 5\' 5"  (1.651 m)  Weight: 197 lb (89.359 kg)  SpO2: 99%  PainSc: 0-No pain    Objective:  Physical Exam  Constitutional: He is oriented to person, place, and time. He appears well-developed and well-nourished. No distress.   HENT:  Head: Normocephalic and atraumatic.  Mouth/Throat: Oropharynx is clear and moist.  Eyes: EOM are normal.  Neck: Neck supple.  Cardiovascular: Normal rate and regular rhythm.   No murmur heard. Pulmonary/Chest: Breath sounds normal. He has no wheezes. He has no rales.  Abdominal: Soft. He exhibits no distension. There is no tenderness.  Musculoskeletal: Normal range of motion. He exhibits edema (1+ pitting edema BLE ). He exhibits no tenderness.  Neurological: He is alert and oriented to person, place, and time. No cranial nerve deficit. Coordination normal.  Skin: Skin is warm and dry.   Assessment & Plan:  Please refer to problem based charting.

## 2014-09-06 NOTE — Assessment & Plan Note (Signed)
Assessment: L leg swelling noted during hospitalization. Chronic but it had gotten a lot worse prior to admission. No DVT found on venous duplex 09/02/2014. Resolved.  Plan: continue to monitor

## 2014-09-07 LAB — COMPLETE METABOLIC PANEL WITH GFR
ALT: 26 U/L (ref 0–53)
AST: 20 U/L (ref 0–37)
Albumin: 3.5 g/dL (ref 3.5–5.2)
Alkaline Phosphatase: 62 U/L (ref 39–117)
BILIRUBIN TOTAL: 1 mg/dL (ref 0.3–1.2)
BUN: 22 mg/dL (ref 6–23)
CHLORIDE: 109 meq/L (ref 96–112)
CO2: 28 mEq/L (ref 19–32)
CREATININE: 0.97 mg/dL (ref 0.50–1.35)
Calcium: 8.8 mg/dL (ref 8.4–10.5)
GFR, Est African American: >89 mL/min
GFR, Est Non African American: 78 mL/min
Glucose, Bld: 121 mg/dL — ABNORMAL HIGH (ref 70–99)
Potassium: 3.8 mEq/L (ref 3.5–5.3)
SODIUM: 147 meq/L — AB (ref 135–145)
TOTAL PROTEIN: 6.8 g/dL (ref 6.0–8.3)

## 2014-09-07 LAB — C-REACTIVE PROTEIN: CRP: 1.4 mg/dL — ABNORMAL HIGH (ref <0.60)

## 2014-09-08 LAB — CULTURE, BLOOD (ROUTINE X 2)
CULTURE: NO GROWTH
CULTURE: NO GROWTH

## 2014-09-15 ENCOUNTER — Telehealth: Payer: Self-pay | Admitting: *Deleted

## 2014-09-15 NOTE — Telephone Encounter (Signed)
[facility] called and orders from Dr [name] given.

## 2014-09-15 NOTE — Telephone Encounter (Signed)
That will be fine. Thanks [employer]

## 2014-09-15 NOTE — Telephone Encounter (Signed)
Call from [place], [place] with [facility] [phone] Request Verbal Order for Physical Therapy twice a week for 2 weeks.  Home health evaluation is done. She also request a Copywriter, advertising.

## 2014-09-17 ENCOUNTER — Telehealth: Payer: Self-pay | Admitting: *Deleted

## 2014-09-17 NOTE — Telephone Encounter (Signed)
[facility] calls and ask for verbal approval for 2 to 3 visits for assistance with community resources and emotional support, verbal was given, do you approve?

## 2014-09-18 ENCOUNTER — Other Ambulatory Visit: Payer: Self-pay | Admitting: Internal Medicine

## 2014-09-20 NOTE — Telephone Encounter (Signed)
Thank you Edd Fabian. That would be fine

## 2014-09-23 ENCOUNTER — Ambulatory Visit: Payer: Self-pay | Admitting: Internal Medicine

## 2014-10-20 ENCOUNTER — Telehealth: Payer: Self-pay | Admitting: *Deleted

## 2014-10-20 NOTE — Telephone Encounter (Signed)
[facility] informed 

## 2014-10-20 NOTE — Telephone Encounter (Signed)
Call from [facility] with [facility] - # [phone]  Nurse is asking for a Verbal Order for social Worker consult. Will you be okay with this?

## 2014-10-20 NOTE — Telephone Encounter (Signed)
VO for soc work consult OK.

## 2014-10-26 NOTE — Progress Notes (Signed)
Late Entry Addendum to Initial Evaluation    09/02/14 1213  PT Time Calculation  PT Start Time (ACUTE ONLY) 1142  PT Stop Time (ACUTE ONLY) 1210  PT Time Calculation (min) (ACUTE ONLY) 28 min  PT G-Codes **NOT FOR INPATIENT CLASS**  Functional Assessment Tool Used clinical judgement  Functional Limitation Mobility: Walking and moving around  Mobility: Walking and Moving Around Current Status (L0786) CI  Mobility: Walking and Moving Around Goal Status (L5449) CH  PT General Charges  $$ ACUTE PT VISIT 1 Procedure  PT Evaluation  $Initial PT Evaluation Tier I 1 Procedure  PT Treatments  $Gait Training 8-22 mins   10/26/2014  Donnella Sham, PT [phone]  (pager)

## 2014-11-01 LAB — HM DIABETES EYE EXAM

## 2014-11-15 ENCOUNTER — Encounter: Payer: Self-pay | Admitting: *Deleted

## 2014-11-15 ENCOUNTER — Other Ambulatory Visit: Payer: Self-pay | Admitting: Internal Medicine

## 2014-11-15 DIAGNOSIS — E119 Type 2 diabetes mellitus without complications: Secondary | ICD-10-CM

## 2014-11-22 ENCOUNTER — Telehealth: Payer: Self-pay

## 2014-11-22 NOTE — Telephone Encounter (Signed)
Spoke to spouse. R/s appt.

## 2014-11-24 ENCOUNTER — Ambulatory Visit: Payer: Commercial Managed Care - HMO | Admitting: Nurse Practitioner

## 2014-11-29 ENCOUNTER — Ambulatory Visit: Payer: Commercial Managed Care - HMO | Admitting: Nurse Practitioner

## 2014-12-01 ENCOUNTER — Encounter: Payer: Self-pay | Admitting: Nurse Practitioner

## 2014-12-01 ENCOUNTER — Ambulatory Visit (INDEPENDENT_AMBULATORY_CARE_PROVIDER_SITE_OTHER): Payer: Commercial Managed Care - HMO | Admitting: Nurse Practitioner

## 2014-12-01 VITALS — BP 187/68 | HR 56 | Temp 98.0°F | Ht 65.5 in | Wt 188.0 lb

## 2014-12-01 DIAGNOSIS — I6350 Cerebral infarction due to unspecified occlusion or stenosis of unspecified cerebral artery: Secondary | ICD-10-CM

## 2014-12-01 DIAGNOSIS — G4733 Obstructive sleep apnea (adult) (pediatric): Secondary | ICD-10-CM

## 2014-12-01 DIAGNOSIS — I639 Cerebral infarction, unspecified: Secondary | ICD-10-CM

## 2014-12-01 DIAGNOSIS — I10 Essential (primary) hypertension: Secondary | ICD-10-CM

## 2014-12-01 DIAGNOSIS — F0150 Vascular dementia without behavioral disturbance: Secondary | ICD-10-CM

## 2014-12-01 DIAGNOSIS — R269 Unspecified abnormalities of gait and mobility: Secondary | ICD-10-CM

## 2014-12-01 MED ORDER — MEMANTINE HCL ER 28 MG PO CP24: 28.0000 mg | ORAL_CAPSULE | Freq: Every day | ORAL | Status: DC

## 2014-12-01 MED ORDER — DONEPEZIL HCL 10 MG PO TABS: 10.0000 mg | ORAL_TABLET | Freq: Every day | ORAL | Status: DC

## 2014-12-01 MED ORDER — CLOPIDOGREL BISULFATE 75 MG PO TABS: 75.0000 mg | ORAL_TABLET | Freq: Every day | ORAL | Status: DC

## 2014-12-01 NOTE — Progress Notes (Addendum)
[facility]  PATIENT: [name] DOB: [birth year]   REASON FOR VISIT: Follow-up for history of stroke and dementia  HISTORY FROM: Patient and wife    HISTORY OF PRESENT ILLNESS: 74 year-old AA male with right hemispheric TIA in September 2011 and vascular risk factors of uncontrolled HT for many years, hyperlipidimia, diabetes and sleep apnea. History of right MCA aneurysm clipping in 2009.Mild cognitive impairment versus early dementia.   He returns for f/u after last visit on 09/23/2012. He was admitted to [facility] on 03/10/13 with generalized weakness and deconditioning. MRI brain showed no acute abnormalities. He went for rehab to SNF and is improving with PT/OT and is now walking with a walker. He has noticed worsening of his memory and cognitive difficulties. He forgets recent information and needs more help with ADLs like shaving and combing hair. He has been on aricept for a while and is tolerating it well.   UPDATE: 12/01/13  [name], 74 year old male returns for follow-up. He was last seen in this office by Charlott Holler nurse practitioner on 05/24/2014. He has not had further stroke or TIA symptoms. He is currently on Plavix daily His memory is about the same. He is currently on Aricept 10 mg daily and Namenda 28XR daily. He has visual and auditory hallucinations at night. He does not wander. He has had one fall since last seen.He had labwork for reversible causes of cognitive impairment and EEG in 2014 which were unremarkable. He can feed himself and needs minimal assistance with dressing. He can be incontinent but often misses the toilet. She is trying to get benefit through the [place] for help in the home. His blood pressure is noted to be elevated today however he has not taken his hypertensive medications. He returns for reevaluation  REVIEW OF SYSTEMS: Full 14 system review of systems performed and notable only for those listed, all others are neg:    Constitutional: neg  Cardiovascular: Leg swelling Ear/Nose/Throat: Hearing loss  Skin: neg Eyes: neg Respiratory: neg Gastroitestinal: neg  Hematology/Lymphatic: neg  Endocrine: Intolerance to heat and cold  Musculoskeletal: Joint pain, walking difficulty Allergy/Immunology: neg Neurological: Memory loss Psychiatric: Hallucinations Sleep : Daytime sleepiness   ALLERGIES: Allergies  Allergen Reactions  . Pioglitazone Other (See Comments)    Edema   . Rosiglitazone Maleate Other (See Comments)    edema    HOME MEDICATIONS: Outpatient Prescriptions Prior to Visit  Medication Sig Dispense Refill  . Aluminum & Magnesium Hydroxide (LIQUID ANTACID PO) Take 30 mLs by mouth every 4 (four) hours as needed (for indegestion).     Marland Kitchen amLODipine (NORVASC) 10 MG tablet Take 1 tablet (10 mg total) by mouth daily. 90 tablet 3  . carvedilol (COREG) 12.5 MG tablet TAKE ONE TABLET BY MOUTH TWICE DAILY 60 tablet 11  . clopidogrel (PLAVIX) 75 MG tablet Take 1 tablet (75 mg total) by mouth daily. 30 tablet 11  . donepezil (ARICEPT) 10 MG tablet Take 1 tablet (10 mg total) by mouth at bedtime. 30 tablet 5  . Hypromellose (NATURAL BALANCE TEARS) 0.4 % SOLN Apply 2 drops to eye 3 (three) times daily.    Marland Kitchen lisinopril (PRINIVIL,ZESTRIL) 40 MG tablet Take 1 tablet (40 mg total) by mouth daily. 30 tablet 11  . Memantine HCl ER (NAMENDA XR) 28 MG CP24 Take 28 mg by mouth daily. 30 capsule 5  . simvastatin (ZOCOR) 20 MG tablet Take 1 tablet (20 mg total) by mouth at bedtime. 30 tablet 11  No facility-administered medications prior to visit.    PAST MEDICAL HISTORY: Past Medical History  Diagnosis Date  . Hypertension   . 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 TIA (transient ischemic attack)     multiple in past, most recently in 2011  . CHF (congestive heart failure)   . Depression   .  Hyperlipemia   . Stroke 2009    left thalamic infarct, residual LUE weakness  . Type II diabetes mellitus   . Sleep apnea     "my machine [place]; got to get me another one" (09/01/2014)  . Arthritis     "maybe"    PAST SURGICAL HISTORY: Past Surgical History  Procedure Laterality Date  . Knee arthroscopy Left   . Shoulder arthroscopy w/ rotator cuff repair Right   . 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 Cardiac catheterization  07/2010  . Craniotomy  05/2008    for clipping of an incidental asymptomatic unruptured/notes 02/13/2011    FAMILY HISTORY: Family History  Problem Relation Age of Onset  . Colon cancer Neg Hx   . Diabetes Sister   . Diabetes Brother   . Diabetes Paternal Aunt   . Stroke Mother   . Pneumonia Father     SOCIAL HISTORY: History   Social History  . Marital Status: Married    Spouse Name: [name]  . Number of Children: 3  . Years of Education: HS   Occupational History  . disabled    Social History Main Topics  . Smoking status: Former Smoker -- 1.50 packs/day for 15 years    Types: Cigarettes    Quit date: 10/16/1983  . Smokeless tobacco: Never Used  . Alcohol Use: No  . Drug Use: No  . Sexual Activity: No   Other Topics Concern  . Not on file   Social History Narrative   Pt lives at home with spouse. Currently in rehab at [facility].   Caffeine Use: 1 cup daily     PHYSICAL EXAM  Filed Vitals:   12/01/14 1107  BP: 187/68  Pulse: 56  Temp: 98 F (36.7 C)  TempSrc: Oral  Height: 5' 5.5" (1.664 m)  Weight: 188 lb (85.276 kg)   Body mass index is 30.8 kg/(m^2). Generalized: In no acute distress, seated elderly AA male  Neck: Supple, no carotid bruits  Cardiac: Regular rate rhythm, no murmur  Pulmonary: Clear to auscultation bilaterally  Musculoskeletal: right second finger partial amputation, left Frozen shoulder  Skin: no rash/petichiae. 1 + edema feet bilaterally.  Vascular: Normal pulses all  extremities   Neurological examination  Mentation: Alert oriented to time, place, language fluent, SLOW TO RESPOND, FLAT AFFECT,  MMSE 23/30,  with deficits in orientation, attention and calculation. Animal Naming test scored 13  Clock drawing 3/4.   Cranial nerve II-XII: Pupils were equal round reactive to light extraocular movements were full, visual field were full on confrontational test. facial sensation and strength were normal. hearing was intact to finger rubbing bilaterally. Uvula tongue midline. head turning and shoulder shrug and were normal and symmetric. BILATERAL PTOSIS L>R, PARTIALLY RESTRICTING FIELD OF VISION.  MOTOR: normal bulk and tone, Normal strength in all tested extremity muscles.Limited shoulder abduction left more than right due to frozen shoulder. Mild diminished fine finger movements on left and orbits right over left upper extremity.  Sensory: intact to light touch  on all 4 extremities. Coordination: Rapid alternating movements normal in all extremities. Finger-to-nose and heel-to-shin performed accurately bilaterally.  Gait and Station: Arises from chair without difficulty. Stance is broad based. Gait demonstrates normal stride length and balance . Not able to heel, toe and tandem walk without difficulty. Using a single-point cane  Reflexes: 1+ and symmetric.    DIAGNOSTIC DATA (LABS, IMAGING, TESTING) - I reviewed patient records, labs, notes, testing and imaging myself where available.  Lab Results  Component Value Date   WBC 5.1 09/03/2014   HGB 9.9* 09/03/2014   HCT 31.5* 09/03/2014   MCV 83.8 09/03/2014   PLT 158 09/03/2014      Component Value Date/Time   NA 145 09/03/2014 0524   NA 145* 02/17/2014 1524   K 3.6* 09/03/2014 0524   CL 108 09/03/2014 0524   CO2 26 09/03/2014 0524   GLUCOSE 99 09/03/2014 0524   GLUCOSE 126* 02/17/2014 1524   BUN 17 09/03/2014 0524   BUN 19 02/17/2014 1524   CREATININE 0.99 09/03/2014 0524   CREATININE 0.97  09/01/2014 1438   CALCIUM 8.6 09/03/2014 0524   PROT 6.8 09/01/2014 1438   PROT 6.7 02/17/2014 1524   ALBUMIN 3.5 09/01/2014 1438   AST 20 09/01/2014 1438   ALT 26 09/01/2014 1438   ALKPHOS 62 09/01/2014 1438   BILITOT 1.0 09/01/2014 1438   GFRNONAA 80* 09/03/2014 0524   GFRNONAA 78 09/01/2014 1438   GFRAA >90 09/03/2014 0524   GFRAA >89 09/01/2014 1438   Lab Results  Component Value Date   CHOL 102 06/08/2014   HDL 47 06/08/2014   LDLCALC 46 06/08/2014   TRIG 47 06/08/2014   CHOLHDL 2.2 06/08/2014   Lab Results  Component Value Date   HGBA1C 5.9 09/01/2014   ASSESSMENT AND PLAN  74 y.o. year old male  has a past medical history of Hypertension; Cerebral aneurysm without rupture (05/2008); TIA (transient ischemic attack); CHF (congestive heart failure); Depression; Hyperlipemia; Stroke  (2009); Type II diabetes mellitus; Sleep apnea; and Arthritis. here to follow-up. He has not had further stroke or TIA symptoms. He has vascular risk factors of  Hypertension, hyperlipidemia diabetes and sleep apnea. His memory has stabilized with the addition of Namenda.The patient is a current patient of Dr. Leonie Man  who is out of the office today . This note is sent to the work in doctor.     Continue Aricept 10 mg daily will refill Continue Namenda XR 28 mg daily will refill Continue Plavix daily will refill Blood pressure is elevated today please  take  medications when you  get home Follow-up in 6-8 months Dennie Bible, [facility], [facility], [facility] [address] [place], [place] [address] [phone]  I reviewed the above note and documentation by the Nurse Practitioner and agree with the history, physical exam, assessment and plan as outlined above. I was immediately available for face-to-face consultation. Star Age, MD, PhD [facility])

## 2014-12-01 NOTE — Patient Instructions (Signed)
Memory score has improved from last visit Continue Aricept 10 mg daily will refill Continue Namenda XR 28 mg daily will refill Continue Plavix daily will refill Follow-up in 6-8 months

## 2014-12-15 ENCOUNTER — Telehealth: Payer: Self-pay | Admitting: *Deleted

## 2014-12-15 NOTE — Telephone Encounter (Signed)
Telephone note encounter opened in error. Yvonna Alanis, RN, 12/15/14, 3:18 PM

## 2015-01-06 ENCOUNTER — Ambulatory Visit (INDEPENDENT_AMBULATORY_CARE_PROVIDER_SITE_OTHER): Payer: Commercial Managed Care - HMO | Admitting: Internal Medicine

## 2015-01-06 ENCOUNTER — Encounter: Payer: Self-pay | Admitting: Internal Medicine

## 2015-01-06 VITALS — BP 154/50 | HR 52 | Temp 98.2°F | Ht 65.0 in | Wt 193.6 lb

## 2015-01-06 DIAGNOSIS — N3941 Urge incontinence: Secondary | ICD-10-CM

## 2015-01-06 DIAGNOSIS — Z0000 Encounter for general adult medical examination without abnormal findings: Secondary | ICD-10-CM

## 2015-01-06 DIAGNOSIS — I639 Cerebral infarction, unspecified: Secondary | ICD-10-CM | POA: Diagnosis not present

## 2015-01-06 DIAGNOSIS — E119 Type 2 diabetes mellitus without complications: Secondary | ICD-10-CM

## 2015-01-06 DIAGNOSIS — Z23 Encounter for immunization: Secondary | ICD-10-CM

## 2015-01-06 DIAGNOSIS — I10 Essential (primary) hypertension: Secondary | ICD-10-CM

## 2015-01-06 DIAGNOSIS — F0150 Vascular dementia without behavioral disturbance: Secondary | ICD-10-CM

## 2015-01-06 DIAGNOSIS — R269 Unspecified abnormalities of gait and mobility: Secondary | ICD-10-CM

## 2015-01-06 LAB — BASIC METABOLIC PANEL WITH GFR
BUN: 23 mg/dL (ref 6–23)
CHLORIDE: 105 meq/L (ref 96–112)
CO2: 31 mEq/L (ref 19–32)
Calcium: 9 mg/dL (ref 8.4–10.5)
Creat: 1.15 mg/dL (ref 0.50–1.35)
GFR, EST AFRICAN AMERICAN: 74 mL/min
GFR, EST NON AFRICAN AMERICAN: 64 mL/min
GLUCOSE: 98 mg/dL (ref 70–99)
POTASSIUM: 3.5 meq/L (ref 3.5–5.3)
SODIUM: 143 meq/L (ref 135–145)

## 2015-01-06 LAB — GLUCOSE, CAPILLARY: Glucose-Capillary: 102 mg/dL — ABNORMAL HIGH (ref 70–99)

## 2015-01-06 LAB — POCT GLYCOSYLATED HEMOGLOBIN (HGB A1C): Hemoglobin A1C: 5.8

## 2015-01-06 MED ORDER — CARVEDILOL 12.5 MG PO TABS: 12.5000 mg | ORAL_TABLET | Freq: Two times a day (BID) | ORAL | Status: AC

## 2015-01-06 MED ORDER — AMLODIPINE BESYLATE 10 MG PO TABS: 10.0000 mg | ORAL_TABLET | Freq: Every day | ORAL | Status: DC

## 2015-01-06 MED ORDER — LISINOPRIL 40 MG PO TABS: 40.0000 mg | ORAL_TABLET | Freq: Every day | ORAL | Status: DC

## 2015-01-06 MED ORDER — SIMVASTATIN 20 MG PO TABS: 20.0000 mg | ORAL_TABLET | Freq: Every day | ORAL | Status: DC

## 2015-01-06 NOTE — Assessment & Plan Note (Signed)
-   he has no current complaints - He is due for a prevnar vaccine today and this was given

## 2015-01-06 NOTE — Patient Instructions (Signed)
-   It was a pleasure seeing you today - I have filled the form for the [place] today. Please pick it up at the front desk - Please follow up in 3 months - I have refilled your BP meds. Please take them as instructed and call us if your meds are running out - We will check some blood work and urine tests on you today - I will refer you to urology for further w/u of your urinary incontinence

## 2015-01-06 NOTE — Progress Notes (Signed)
   Subjective:    Patient ID: [name], male    DOB: 1941/02/11, 74 y.o.   MRN: [id number]  HPI Patient here for routine follow up of his HTN and diabetes. He was recently seen by neurology in follow up for his likely dementia and for his history of CVA. He also needs a form filled for the [place] and is here for evaluation to see if he qualifies for home assistance  Review of Systems  HENT: Negative.   Eyes: Negative.   Respiratory: Negative.   Cardiovascular: Positive for leg swelling. Negative for chest pain and palpitations.  Gastrointestinal: Negative.   Genitourinary: Positive for urgency. Negative for dysuria, discharge, difficulty urinating and penile pain.  Musculoskeletal: Positive for gait problem. Negative for myalgias, back pain, joint swelling, arthralgias and neck pain.  Skin: Negative.   Neurological: Positive for weakness. Negative for dizziness, tremors, syncope, light-headedness, numbness and headaches.  Psychiatric/Behavioral: Negative.        Objective:   Physical Exam  Constitutional: He is oriented to person, place, and time. He appears well-developed and well-nourished.  HENT:  Head: Normocephalic and atraumatic.  Eyes: Conjunctivae are normal. Right eye exhibits no discharge. Left eye exhibits no discharge.  Neck: Normal range of motion. Neck supple.  Cardiovascular: Normal rate, regular rhythm and normal heart sounds.   Pulmonary/Chest: Effort normal and breath sounds normal. No respiratory distress. He has no wheezes.  Abdominal: Soft. Bowel sounds are normal. He exhibits no distension. There is no tenderness.  Musculoskeletal: Normal range of motion. He exhibits edema. He exhibits no tenderness.  Patient with 2 + b/l LE edema L>R (chronic per patient)  Neurological: He is alert and oriented to person, place, and time. He displays no tremor. No cranial nerve deficit or sensory deficit. Gait abnormal. GCS eye subscore is 4. GCS verbal subscore is 5. GCS  motor subscore is 6.  Power is 3/5 L UE at the shoulder and 5/5 RUE, LLE and RLE   Skin: Skin is warm and dry.  Psychiatric: He has a normal mood and affect. His behavior is normal.          Assessment & Plan:  Please see problem based charting for assessment and plan:

## 2015-01-06 NOTE — Assessment & Plan Note (Signed)
-   being followed by neurology - c/w namenda and aricept - His memory is still bad per his wife but is stable on his current regimen - He still has incidents where he urinates on the floor and forgets that he did and he leaves the water running occasionally. He also needs his wife to arrange his medications for him and assist him with ADLs

## 2015-01-06 NOTE — Assessment & Plan Note (Signed)
-   Patient walked in clinic today and was noted to have an unsteady gait with slight swaying when he walks and needing to hold on to the wheelchair for support - He was able to walk approx 30-40 steps in the clinic - he will need to continue to need his walker/cane at home - I filled out his [facility] form today documenting my findings - Patient also has not been able to maintain proper hygiene as the last time he was in the tub to shower he fell down and his wife is scared he will fall again

## 2015-01-06 NOTE — Assessment & Plan Note (Addendum)
Lab Results  Component Value Date   HGBA1C 5.8 01/06/2015   HGBA1C 5.9 09/01/2014   HGBA1C 6.0 06/08/2014     Assessment: Diabetes control:  well controlled Progress toward A1C goal:   at goal Comments: Patient is well controlled on diet alone  Plan: Medications:  c/w diet control Home glucose monitoring: Frequency:   Timing:   Instruction/counseling given: reminded to get eye exam and discussed diet Educational resources provided:   Self management tools provided:   Other plans: Will f/u in 3 months for an A1C. Will check urine microalbumin today

## 2015-01-06 NOTE — Assessment & Plan Note (Signed)
BP Readings from Last 3 Encounters:  01/06/15 154/50  12/01/14 187/68  09/06/14 150/52    Lab Results  Component Value Date   NA 145 09/03/2014   K 3.6* 09/03/2014   CREATININE 0.99 09/03/2014    Assessment: Blood pressure control:  fair Progress toward BP goal:   improved Comments: Patient has run out of his lisinopril and has not taken it today. Will refill his BP meds and follow up in 3 months  Plan: Medications:  continue current medications Educational resources provided:   Self management tools provided:   Other plans:

## 2015-01-06 NOTE — Assessment & Plan Note (Signed)
-   Still with increased frequency, nocturia and episodes of urinating on the floor - Will refer to urology for follow up - He is now off terazosin secondary to his falls. Will f.u urology consult and consider starting finasteride

## 2015-01-07 LAB — MICROALBUMIN / CREATININE URINE RATIO
Creatinine, Urine: 149.5 mg/dL
Microalb Creat Ratio: 28.8 mg/g (ref 0.0–30.0)
Microalb, Ur: 4.3 mg/dL — ABNORMAL HIGH (ref <2.0)

## 2015-03-18 ENCOUNTER — Telehealth: Payer: Self-pay | Admitting: Internal Medicine

## 2015-03-18 DIAGNOSIS — E119 Type 2 diabetes mellitus without complications: Secondary | ICD-10-CM

## 2015-03-18 NOTE — Telephone Encounter (Signed)
-----   Message from [facility], [place] sent at 03/18/2015 11:05 AM EDT ----- Regarding: referral for Eye spec Patient was seen by his optometrist,they are referring him to Retina specialist,would you put eye refer inPatient has appointment on June 7,2016 [place], [place] C6/3/201611:07 AM

## 2015-03-22 ENCOUNTER — Encounter (INDEPENDENT_AMBULATORY_CARE_PROVIDER_SITE_OTHER): Payer: Commercial Managed Care - HMO | Admitting: Ophthalmology

## 2015-03-28 ENCOUNTER — Encounter (INDEPENDENT_AMBULATORY_CARE_PROVIDER_SITE_OTHER): Payer: Commercial Managed Care - HMO | Admitting: Ophthalmology

## 2015-04-07 ENCOUNTER — Encounter: Payer: Self-pay | Admitting: Internal Medicine

## 2015-04-07 ENCOUNTER — Ambulatory Visit (INDEPENDENT_AMBULATORY_CARE_PROVIDER_SITE_OTHER): Payer: Commercial Managed Care - HMO | Admitting: Internal Medicine

## 2015-04-07 VITALS — BP 150/44 | HR 54 | Temp 98.3°F | Ht 65.0 in | Wt 193.6 lb

## 2015-04-07 DIAGNOSIS — R600 Localized edema: Secondary | ICD-10-CM

## 2015-04-07 DIAGNOSIS — I10 Essential (primary) hypertension: Secondary | ICD-10-CM | POA: Diagnosis not present

## 2015-04-07 DIAGNOSIS — R32 Unspecified urinary incontinence: Secondary | ICD-10-CM | POA: Diagnosis not present

## 2015-04-07 DIAGNOSIS — E119 Type 2 diabetes mellitus without complications: Secondary | ICD-10-CM | POA: Diagnosis not present

## 2015-04-07 DIAGNOSIS — R609 Edema, unspecified: Secondary | ICD-10-CM

## 2015-04-07 LAB — GLUCOSE, CAPILLARY: Glucose-Capillary: 171 mg/dL — ABNORMAL HIGH (ref 65–99)

## 2015-04-07 LAB — POCT GLYCOSYLATED HEMOGLOBIN (HGB A1C): Hemoglobin A1C: 6

## 2015-04-07 NOTE — Assessment & Plan Note (Signed)
-   Patient still with episodes of incontinence - Started on "medication" by urology - They will follow up in 2 weeks with uro - Will obtain urology notes and add medication into chart

## 2015-04-07 NOTE — Assessment & Plan Note (Signed)
-   still with mild b/l LE edema L>R - Chronic per wife and notes - Negative duplex in 11/15 - no further work up for now - Would consider repeat duplex if worsening edema - Advised leg elevation - If persistent would consider compression stockings

## 2015-04-07 NOTE — Assessment & Plan Note (Signed)
Lab Results  Component Value Date   HGBA1C 6.0 04/07/2015   HGBA1C 5.8 01/06/2015   HGBA1C 5.9 09/01/2014     Assessment: Diabetes control:  well controlled Progress toward A1C goal:   at goal Comments: diet controlled  Plan: Medications:  continue with diabetic diet Home glucose monitoring: Frequency:   Timing:   Instruction/counseling given: reminded to get eye exam and reminded to bring medications to each visit Educational resources provided: brochure (Denied) Self management tools provided:   Other plans: Will f/u in 3-6 months

## 2015-04-07 NOTE — Progress Notes (Signed)
   Subjective:    Patient ID: [name], male    DOB: 10-Jan-1941, 74 y.o.   MRN: [id number]  HPI Patient seen and examined. He presents today for a routine follow up of his diabetes and his hypertension He feels well and has no other complaints at this time.   However on speaking to his wife she states that he occasionally gets LE swelling L>R which is chronic since his stroke. She also states that his memory is slowly getting worse and that he still has symptoms of urinary incontinence for which he is following with urology.   Review of Systems  Constitutional: Negative.   HENT: Negative.   Eyes: Negative.   Respiratory: Negative.   Cardiovascular: Positive for leg swelling. Negative for chest pain and palpitations.  Genitourinary:       Patient with urinary incontinence  Musculoskeletal: Negative.   Skin: Negative.   Neurological: Negative.   Psychiatric/Behavioral: Negative.        Objective:   Physical Exam  Constitutional: He is oriented to person, place, and time. He appears well-developed and well-nourished. No distress.  HENT:  Head: Normocephalic and atraumatic.  Eyes: Conjunctivae are normal.  Neck: Normal range of motion.  Cardiovascular: Normal rate, regular rhythm and normal heart sounds.   Pulmonary/Chest: Effort normal and breath sounds normal. No respiratory distress. He has no wheezes.  Abdominal: Soft. Bowel sounds are normal. He exhibits no distension. There is no tenderness.  Musculoskeletal: Normal range of motion. He exhibits edema.  Mild bilateral LE edema L>R  Neurological: He is alert and oriented to person, place, and time.  Skin: Skin is warm and dry.  Psychiatric: He has a normal mood and affect. His behavior is normal.          Assessment & Plan:  Please see problem based charting for assessment and plan:

## 2015-04-07 NOTE — Patient Instructions (Addendum)
- It was a pleasure seeing you today - Please follow up with urology for urinary incontinence - Your diabetes is at goal. Please continue with your diet - Try and keep your legs elevated to decrease swelling. If it worsens please follow up with [place] in the clinic - Your BP is a little high today. We will recheck it before you leave. Will consider adding an additional medication for better BP control  Urinary Incontinence Urinary incontinence is the involuntary loss of urine from your bladder. CAUSES  There are many causes of urinary incontinence. They include:  Medicines.  Infections.  Prostatic enlargement, leading to overflow of urine from your bladder.  Surgery.  Neurological diseases.  Emotional factors. SIGNS AND SYMPTOMS Urinary Incontinence can be divided into four types: 1. Urge incontinence. Urge incontinence is the involuntary loss of urine before you have the opportunity to go to the bathroom. There is a sudden urge to void but not enough time to reach a bathroom. 2. Stress incontinence. Stress incontinence is the sudden loss of urine with any activity that forces urine to pass. It is commonly caused by anatomical changes to the pelvis and sphincter areas of your body. 3. Overflow incontinence. Overflow incontinence is the loss of urine from an obstructed opening to your bladder. This results in a backup of urine and a resultant buildup of pressure within the bladder. When the pressure within the bladder exceeds the closing pressure of the sphincter, the urine overflows, which causes incontinence, similar to water overflowing a dam. 4. Total incontinence. Total incontinence is the loss of urine as a result of the inability to store urine within your bladder. DIAGNOSIS  Evaluating the cause of incontinence may require:  A thorough and complete medical and obstetric history.  A complete physical exam.  Laboratory tests such as a urine culture and sensitivities. When  additional tests are indicated, they can include:  An ultrasound exam.  Kidney and bladder X-rays.  Cystoscopy. This is an exam of the bladder using a narrow scope.  Urodynamic testing to test the nerve function to the bladder and sphincter areas. TREATMENT  Treatment for urinary incontinence depends on the cause:  For urge incontinence caused by a bacterial infection, antibiotics will be prescribed. If the urge incontinence is related to medicines you take, your health care provider may have you change the medicine.  For stress incontinence, surgery to re-establish anatomical support to the bladder or sphincter, or both, will often correct the condition.  For overflow incontinence caused by an enlarged prostate, an operation to open the channel through the enlarged prostate will allow the flow of urine out of the bladder. In women with fibroids, a hysterectomy may be recommended.  For total incontinence, surgery on your urinary sphincter may help. An artificial urinary sphincter (an inflatable cuff placed around the urethra) may be required. In women who have developed a hole-like passage between their bladder and vagina (vesicovaginal fistula), surgery to close the fistula often is required. HOME CARE INSTRUCTIONS  Normal daily hygiene and the use of pads or adult diapers that are changed regularly will help prevent odors and skin damage.  Avoid caffeine. It can overstimulate your bladder.  Use the bathroom regularly. Try about every 2-3 hours to go to the bathroom, even if you do not feel the need to do so. Take time to empty your bladder completely. After urinating, wait a minute. Then try to urinate again.  For causes involving nerve dysfunction, keep a log of the  medicines you take and a journal of the times you go to the bathroom. SEEK MEDICAL CARE IF:  You experience worsening of pain instead of improvement in pain after your procedure.  Your incontinence becomes worse instead  of better. SEE IMMEDIATE MEDICAL CARE IF:  You experience fever or shaking chills.  You are unable to pass your urine.  You have redness spreading into your groin or down into your thighs. MAKE SURE YOU:   Understand these instructions.   Will watch your condition.  Will get help right away if you are not doing well or get worse. Document Released: 11/08/2004 Document Revised: 07/22/2013 Document Reviewed: 03/10/2013 [facility] Patient Information 2015 [place], [place]. This information is not intended to replace advice given to you by your health care provider. Make sure you discuss any questions you have with your health care provider.

## 2015-04-07 NOTE — Assessment & Plan Note (Signed)
BP Readings from Last 3 Encounters:  04/07/15 150/44  01/06/15 154/50  12/01/14 187/68    Lab Results  Component Value Date   NA 143 01/06/2015   K 3.5 01/06/2015   CREATININE 1.15 01/06/2015    Assessment: Blood pressure control:  fair Progress toward BP goal:   improved Comments: patient is compliant with his meds  Plan: Medications:  continue current medications Educational resources provided: brochure (Denied) Self management tools provided:   Other plans: Would consider adding another BP med if patient with worsening BP on follow up.

## 2015-04-08 ENCOUNTER — Telehealth: Payer: Self-pay | Admitting: *Deleted

## 2015-04-08 NOTE — Telephone Encounter (Signed)
That sounds good. Do you need me to put an order in or can you give a verbal order for it?

## 2015-04-08 NOTE — Telephone Encounter (Signed)
Call from [facility] Nurse patient's wife has asked for an assistant to come to help patient.  Nurse will need an order to go assess the patient for the services needed.  Regino Schultze Aiko Belko,RN 04/07/2015 11:15 AM

## 2015-04-13 ENCOUNTER — Encounter (INDEPENDENT_AMBULATORY_CARE_PROVIDER_SITE_OTHER): Payer: Commercial Managed Care - HMO | Admitting: Ophthalmology

## 2015-04-13 DIAGNOSIS — H35033 Hypertensive retinopathy, bilateral: Secondary | ICD-10-CM

## 2015-04-13 DIAGNOSIS — H35372 Puckering of macula, left eye: Secondary | ICD-10-CM | POA: Diagnosis not present

## 2015-04-13 DIAGNOSIS — I10 Essential (primary) hypertension: Secondary | ICD-10-CM

## 2015-04-13 DIAGNOSIS — H59032 Cystoid macular edema following cataract surgery, left eye: Secondary | ICD-10-CM | POA: Diagnosis not present

## 2015-04-13 DIAGNOSIS — E11321 Type 2 diabetes mellitus with mild nonproliferative diabetic retinopathy with macular edema: Secondary | ICD-10-CM | POA: Diagnosis not present

## 2015-04-13 DIAGNOSIS — E11311 Type 2 diabetes mellitus with unspecified diabetic retinopathy with macular edema: Secondary | ICD-10-CM | POA: Diagnosis not present

## 2015-04-13 DIAGNOSIS — E11329 Type 2 diabetes mellitus with mild nonproliferative diabetic retinopathy without macular edema: Secondary | ICD-10-CM | POA: Diagnosis not present

## 2015-04-13 DIAGNOSIS — H43813 Vitreous degeneration, bilateral: Secondary | ICD-10-CM | POA: Diagnosis not present

## 2015-04-13 LAB — HM DIABETES EYE EXAM

## 2015-05-18 ENCOUNTER — Encounter: Payer: Self-pay | Admitting: *Deleted

## 2015-05-30 ENCOUNTER — Encounter (INDEPENDENT_AMBULATORY_CARE_PROVIDER_SITE_OTHER): Payer: Commercial Managed Care - HMO | Admitting: Ophthalmology

## 2015-05-30 DIAGNOSIS — I10 Essential (primary) hypertension: Secondary | ICD-10-CM | POA: Diagnosis not present

## 2015-05-30 DIAGNOSIS — H35033 Hypertensive retinopathy, bilateral: Secondary | ICD-10-CM

## 2015-05-30 DIAGNOSIS — H59032 Cystoid macular edema following cataract surgery, left eye: Secondary | ICD-10-CM

## 2015-05-30 DIAGNOSIS — H35372 Puckering of macula, left eye: Secondary | ICD-10-CM

## 2015-05-30 DIAGNOSIS — H43813 Vitreous degeneration, bilateral: Secondary | ICD-10-CM

## 2015-05-30 DIAGNOSIS — E11319 Type 2 diabetes mellitus with unspecified diabetic retinopathy without macular edema: Secondary | ICD-10-CM

## 2015-05-30 DIAGNOSIS — E11329 Type 2 diabetes mellitus with mild nonproliferative diabetic retinopathy without macular edema: Secondary | ICD-10-CM | POA: Diagnosis not present

## 2015-06-01 ENCOUNTER — Ambulatory Visit (INDEPENDENT_AMBULATORY_CARE_PROVIDER_SITE_OTHER): Payer: Commercial Managed Care - HMO | Admitting: Nurse Practitioner

## 2015-06-01 ENCOUNTER — Encounter: Payer: Self-pay | Admitting: Nurse Practitioner

## 2015-06-01 VITALS — BP 156/62 | HR 56 | Ht 67.0 in | Wt 186.8 lb

## 2015-06-01 DIAGNOSIS — I6350 Cerebral infarction due to unspecified occlusion or stenosis of unspecified cerebral artery: Secondary | ICD-10-CM

## 2015-06-01 DIAGNOSIS — F0150 Vascular dementia without behavioral disturbance: Secondary | ICD-10-CM | POA: Diagnosis not present

## 2015-06-01 DIAGNOSIS — IMO0002 Reserved for concepts with insufficient information to code with codable children: Secondary | ICD-10-CM

## 2015-06-01 DIAGNOSIS — F329 Major depressive disorder, single episode, unspecified: Secondary | ICD-10-CM

## 2015-06-01 DIAGNOSIS — I639 Cerebral infarction, unspecified: Secondary | ICD-10-CM

## 2015-06-01 DIAGNOSIS — I10 Essential (primary) hypertension: Secondary | ICD-10-CM

## 2015-06-01 MED ORDER — SERTRALINE HCL 25 MG PO TABS: 25.0000 mg | ORAL_TABLET | Freq: Every day | ORAL | Status: DC

## 2015-06-01 MED ORDER — DONEPEZIL HCL 10 MG PO TABS: 10.0000 mg | ORAL_TABLET | Freq: Every day | ORAL | Status: AC

## 2015-06-01 MED ORDER — MEMANTINE HCL ER 28 MG PO CP24: 28.0000 mg | ORAL_CAPSULE | Freq: Every day | ORAL | Status: AC

## 2015-06-01 MED ORDER — CLOPIDOGREL BISULFATE 75 MG PO TABS: 75.0000 mg | ORAL_TABLET | Freq: Every day | ORAL | Status: DC

## 2015-06-01 NOTE — Patient Instructions (Addendum)
Memory score is stable Continue Aricept 10 mg daily will refill Continue Namenda XR 28 mg daily will refill Continue Plavix daily will refill Begin low-dose Zoloft for mild depression Blood pressure is elevated today in the office 152/62  please take medications when you get home Follow-up in 6-8 months next with Dr Leonie Man

## 2015-06-01 NOTE — Progress Notes (Signed)
I agree with the above plan 

## 2015-06-01 NOTE — Progress Notes (Signed)
[facility]  PATIENT: [name] DOB: 20-Feb-1941   REASON FOR VISIT: Follow-up for history of stroke and memory loss Complaint of depression HISTORY FROM: Wife and patient    HISTORY OF PRESENT ILLNESS:74 year-old [place] male with right hemispheric TIA in September 2011 and vascular risk factors of uncontrolled HT for many years, hyperlipidimia, diabetes and sleep apnea. History of right MCA aneurysm clipping in 2009.Mild cognitive impairment versus early dementia.  He returns for f/u after last visit on 09/23/2012. He was admitted to [facility] on 03/10/13 with generalized weakness and deconditioning. MRI brain showed no acute abnormalities. He went for rehab to SNF and is improving with PT/OT and is now walking with a walker. He has noticed worsening of his memory and cognitive difficulties. He forgets recent information and needs more help with ADLs like shaving and combing hair. He has been on aricept for a while and is tolerating it well.   UPDATE: 12/01/14 [name], 74 year old male returns for follow-up. He was last seen in this office by Charlott Holler nurse practitioner on 05/24/2014. He has not had further stroke or TIA symptoms. He is currently on Plavix daily His memory is about the same. He is currently on Aricept 10 mg daily and Namenda 28XR daily. He has visual and auditory hallucinations at night. He does not wander. He has had one fall since last seen.He had labwork for reversible causes of cognitive impairment and EEG in 2014 which were unremarkable. He can feed himself and needs minimal assistance with dressing. He can be incontinent but often misses the toilet. She is trying to get benefit through the [place] for help in the home. His blood pressure is noted to be elevated today however he has not taken his hypertensive medications. He returns for reevaluation UPDATE 06/01/2015. [name], 74 year old male returns for follow-up. He has not had further stroke or TIA  symptoms he is currently on Plavix daily. There has been no change in memory according to the wife. He is currently on Aricept and Namenda. He occasionally has auditory hallucinations at night however he does not wander. No recent falls. Appetite is good, he can feed himself and needs minimal assistance with dressing. His blood pressure is noted to be elevated in the office today however he has not taking his antihypertensives this morning. Wife  complains of his  lack of motivation and depression. He returns for reevaluation  REVIEW OF SYSTEMS: Full 14 system review of systems performed and notable only for those listed, all others are neg:  Constitutional: neg  Cardiovascular: neg Ear/Nose/Throat: Hearing loss Skin: neg Eyes: neg Respiratory: neg Gastroitestinal: Constipation, urinary frequency  Hematology/Lymphatic: neg  Endocrine: Excessive thirst Musculoskeletal: Joint pain, walking difficulty Allergy/Immunology: neg Neurological: Memory loss Psychiatric: Agitation depression  Sleep : History of obstructive sleep apnea, does not use CPAP   ALLERGIES: Allergies  Allergen Reactions  . Pioglitazone Other (See Comments)    Edema   . Rosiglitazone Maleate Other (See Comments)    edema    HOME MEDICATIONS: Outpatient Prescriptions Prior to Visit  Medication Sig Dispense Refill  . Aluminum & Magnesium Hydroxide (LIQUID ANTACID PO) Take 30 mLs by mouth every 4 (four) hours as needed (for indegestion).     Marland Kitchen amLODipine (NORVASC) 10 MG tablet Take 1 tablet (10 mg total) by mouth daily. 90 tablet 3  . carvedilol (COREG) 12.5 MG tablet Take 1 tablet (12.5 mg total) by mouth 2 (two) times daily. 60 tablet 11  . clopidogrel (  PLAVIX) 75 MG tablet Take 1 tablet (75 mg total) by mouth daily. 30 tablet 11  . donepezil (ARICEPT) 10 MG tablet Take 1 tablet (10 mg total) by mouth at bedtime. 30 tablet 6  . Hypromellose (NATURAL BALANCE TEARS) 0.4 % SOLN Apply 2 drops to eye 3 (three) times  daily.    Marland Kitchen lisinopril (PRINIVIL,ZESTRIL) 40 MG tablet Take 1 tablet (40 mg total) by mouth daily. 30 tablet 11  . memantine (NAMENDA XR) 28 MG CP24 24 hr capsule Take 1 capsule (28 mg total) by mouth daily. 30 capsule 6  . simvastatin (ZOCOR) 20 MG tablet Take 1 tablet (20 mg total) by mouth at bedtime. 30 tablet 11   No facility-administered medications prior to visit.    PAST MEDICAL HISTORY: Past Medical History  Diagnosis Date  . Hypertension   . 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 TIA (transient ischemic attack)     multiple in past, most recently in 2011  . CHF (congestive heart failure)   . Depression   . Hyperlipemia   . Stroke 2009    left thalamic infarct, residual LUE weakness  . Type II diabetes mellitus   . Sleep apnea     "my machine [place]; got to get me another one" (09/01/2014)  . Arthritis     "maybe"    PAST SURGICAL HISTORY: Past Surgical History  Procedure Laterality Date  . Knee arthroscopy Left   . Shoulder arthroscopy w/ rotator cuff repair Right   . 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 Cardiac catheterization  07/2010  . Craniotomy  05/2008    for clipping of an incidental asymptomatic unruptured/notes 02/13/2011    FAMILY HISTORY: Family History  Problem Relation Age of Onset  . Colon cancer Neg Hx   . Diabetes Sister   . Diabetes Brother   . Diabetes Paternal Aunt   . Stroke Mother   . Pneumonia Father     SOCIAL HISTORY: Social History   Social History  . Marital Status: Married    Spouse Name: [name]  . Number of Children: 3  . Years of Education: HS   Occupational History  . disabled    Social History Main Topics  . Smoking status: Former Smoker -- 1.50 packs/day for 15 years    Types: Cigarettes    Quit date: 10/16/1983  . Smokeless tobacco: Never Used  . Alcohol Use: No  . Drug Use: No  .  Sexual Activity: No   Other Topics Concern  . Not on file   Social History Narrative   Pt lives at home with spouse.   Caffeine Use: 1 cup daily     PHYSICAL EXAM  Filed Vitals:   06/01/15 1028  BP: 156/62  Pulse: 56  Height: 5\' 7"  (1.702 m)  Weight: 186 lb 12.8 oz (84.732 kg)   Body mass index is 29.25 kg/(m^2). Generalized: In no acute distress, seated elderly AA male  Neck: Supple, no carotid bruits  Cardiac: Regular rate rhythm, no murmur  Pulmonary: Clear to auscultation bilaterally  Musculoskeletal: right second finger partial amputation, left Frozen shoulder  Skin: no rash/petichiae. 1 + edema feet bilaterally.  Vascular: Normal pulses all extremities   Neurological examination  Mentation: Alert oriented to time, place, language fluent, SLOW TO RESPOND, FLAT AFFECT, MMSE 24/30, with deficits in orientation, attention and calculation. Animal Naming  test scored 11 Clock drawing 2/4.GDS=7.  Cranial nerve II-XII: Pupils were equal round reactive to light extraocular movements were full, visual field were full on confrontational test. facial sensation and strength were normal. hearing was intact to finger rubbing bilaterally. Uvula tongue midline. head turning and shoulder shrug and were normal and symmetric. BILATERAL PTOSIS L>R, PARTIALLY RESTRICTING FIELD OF VISION.  MOTOR: normal bulk and tone, Normal strength in all tested extremity muscles.Limited shoulder abduction left more than right due to frozen shoulder. Mild diminished fine finger movements on left and orbits right over left upper extremity.  Sensory: intact to light touch on all 4 extremities. Coordination: Rapid alternating movements normal in all extremities. Finger-to-nose and heel-to-shin performed accurately bilaterally.  Gait and Station: Arises from chair without difficulty. Stance is broad based. Gait demonstrates normal stride length and balance . Not able to heel, toe and tandem walk without  difficulty. Using a single-point cane  Reflexes: 1+ and symmetric.   DIAGNOSTIC DATA (LABS, IMAGING, TESTING) - I reviewed patient records, labs, notes, testing and imaging myself where available.  Lab Results  Component Value Date   WBC 5.1 09/03/2014   HGB 9.9* 09/03/2014   HCT 31.5* 09/03/2014   MCV 83.8 09/03/2014   PLT 158 09/03/2014      Component Value Date/Time   NA 143 01/06/2015 1024   NA 145* 02/17/2014 1524   K 3.5 01/06/2015 1024   CL 105 01/06/2015 1024   CO2 31 01/06/2015 1024   GLUCOSE 98 01/06/2015 1024   GLUCOSE 126* 02/17/2014 1524   BUN 23 01/06/2015 1024   BUN 19 02/17/2014 1524   CREATININE 1.15 01/06/2015 1024   CREATININE 0.99 09/03/2014 0524   CALCIUM 9.0 01/06/2015 1024   PROT 6.8 09/01/2014 1438   PROT 6.7 02/17/2014 1524   ALBUMIN 3.5 09/01/2014 1438   AST 20 09/01/2014 1438   ALT 26 09/01/2014 1438   ALKPHOS 62 09/01/2014 1438   BILITOT 1.0 09/01/2014 1438   GFRNONAA 64 01/06/2015 1024   GFRNONAA 80* 09/03/2014 0524   GFRAA 74 01/06/2015 1024   GFRAA >90 09/03/2014 0524   Lab Results  Component Value Date   CHOL 102 06/08/2014   HDL 47 06/08/2014   LDLCALC 46 06/08/2014   TRIG 47 06/08/2014   CHOLHDL 2.2 06/08/2014   Lab Results  Component Value Date   HGBA1C 6.0 04/07/2015     ASSESSMENT AND PLAN  74 y.o. year old male  has a past medical history of Hypertension; Cerebral aneurysm without rupture (05/2008); TIA (transient ischemic attack); CHF (congestive heart failure); Depression; Hyperlipemia; Stroke (2009); Type II diabetes mellitus; Sleep apnea; and Arthritis. here to follow-up   Memory score is stable Continue Aricept 10 mg daily will refill Continue Namenda XR 28 mg daily will refill Continue Plavix daily will refill Begin low-dose Zoloft for mild depression Blood pressure is elevated today in the office 152/62  please take medications when you get home Follow-up in 6-8 months next with Dr Leonie Man Dennie Bible, [facility], [facility], [facility] [address] [place], [place] [address] [phone]

## 2015-08-01 ENCOUNTER — Encounter (INDEPENDENT_AMBULATORY_CARE_PROVIDER_SITE_OTHER): Payer: Commercial Managed Care - HMO | Admitting: Ophthalmology

## 2015-08-01 DIAGNOSIS — H59032 Cystoid macular edema following cataract surgery, left eye: Secondary | ICD-10-CM

## 2015-08-01 DIAGNOSIS — I10 Essential (primary) hypertension: Secondary | ICD-10-CM

## 2015-08-01 DIAGNOSIS — H35033 Hypertensive retinopathy, bilateral: Secondary | ICD-10-CM

## 2015-08-01 DIAGNOSIS — E113291 Type 2 diabetes mellitus with mild nonproliferative diabetic retinopathy without macular edema, right eye: Secondary | ICD-10-CM | POA: Diagnosis not present

## 2015-08-01 DIAGNOSIS — H35372 Puckering of macula, left eye: Secondary | ICD-10-CM

## 2015-08-01 DIAGNOSIS — E11311 Type 2 diabetes mellitus with unspecified diabetic retinopathy with macular edema: Secondary | ICD-10-CM | POA: Diagnosis not present

## 2015-08-01 DIAGNOSIS — E113212 Type 2 diabetes mellitus with mild nonproliferative diabetic retinopathy with macular edema, left eye: Secondary | ICD-10-CM | POA: Diagnosis not present

## 2015-08-03 ENCOUNTER — Encounter: Payer: Self-pay | Admitting: Internal Medicine

## 2015-08-03 ENCOUNTER — Telehealth: Payer: Self-pay

## 2015-08-03 ENCOUNTER — Ambulatory Visit (INDEPENDENT_AMBULATORY_CARE_PROVIDER_SITE_OTHER): Payer: Commercial Managed Care - HMO | Admitting: Internal Medicine

## 2015-08-03 VITALS — BP 154/48 | HR 56 | Temp 97.0°F | Ht 67.0 in | Wt 197.9 lb

## 2015-08-03 DIAGNOSIS — I10 Essential (primary) hypertension: Secondary | ICD-10-CM

## 2015-08-03 DIAGNOSIS — R600 Localized edema: Secondary | ICD-10-CM | POA: Diagnosis not present

## 2015-08-03 DIAGNOSIS — Z86718 Personal history of other venous thrombosis and embolism: Secondary | ICD-10-CM

## 2015-08-03 DIAGNOSIS — E119 Type 2 diabetes mellitus without complications: Secondary | ICD-10-CM

## 2015-08-03 LAB — POCT GLYCOSYLATED HEMOGLOBIN (HGB A1C): HEMOGLOBIN A1C: 6

## 2015-08-03 LAB — GLUCOSE, CAPILLARY: Glucose-Capillary: 178 mg/dL — ABNORMAL HIGH (ref 65–99)

## 2015-08-03 MED ORDER — CHLORTHALIDONE 25 MG PO TABS: 12.5000 mg | ORAL_TABLET | Freq: Every day | ORAL | Status: DC

## 2015-08-03 NOTE — Telephone Encounter (Signed)
Spoke with patients wife.  Per CNA there to bathe patient, he has developed blisters on LLE that are not yet draining and BLE are swollen.  LLE is red, no warmth per aide.   Blisters not present last Thursday on her last visit but present yesterday. Per wife, swelling is impairing patients mobility. Patient scheduled for appointment to be seen this afternoon prior to our conversation. FYI

## 2015-08-03 NOTE — Progress Notes (Addendum)
Subjective:    Patient ID: [name], male    DOB: 11/14/1940, 74 y.o.   MRN: [id number]  HPI Comments: [name] is a 74 year old man with PMH as below here for increasing lower extremity swelling.  Nurse aid comes TWTh.  Today she made family aware of increased swelling since she saw him last Thursday.  Pain in left ankle in past two weeks.  No increased salt, denies HF symptoms, no decreased urine, no noticeable weight gain.  He has been on lasix in the past for leg swelling but not recently.  Hx of left leg DVT years ago per family.  Left leg swollen > right chronically.  No recent long trips, no surgery, no cancer hx.  Sits with legs hanging down most of the day and night.  He does ambulate with cane to bathroom and kitchen.      Past Medical History  Diagnosis Date  . Hypertension   . 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 TIA (transient ischemic attack)     multiple in past, most recently in 2011  . CHF (congestive heart failure) ([place])   . Depression   . Hyperlipemia   . Stroke [facility]) 2009    left thalamic infarct, residual LUE weakness  . Type II diabetes mellitus ([place])   . Sleep apnea     "my machine [place]; got to get me another one" (09/01/2014)  . Arthritis     "maybe"   Current Outpatient Prescriptions on File Prior to Visit  Medication Sig Dispense Refill  . Aluminum & Magnesium Hydroxide (LIQUID ANTACID PO) Take 30 mLs by mouth every 4 (four) hours as needed (for indegestion).     Marland Kitchen amLODipine (NORVASC) 10 MG tablet Take 1 tablet (10 mg total) by mouth daily. 90 tablet 3  . carvedilol (COREG) 12.5 MG tablet Take 1 tablet (12.5 mg total) by mouth 2 (two) times daily. 60 tablet 11  . clopidogrel (PLAVIX) 75 MG tablet Take 1 tablet (75 mg total) by mouth daily. 30 tablet 11  . donepezil (ARICEPT) 10 MG tablet Take 1 tablet (10 mg total) by mouth at  bedtime. 30 tablet 6  . Hypromellose (NATURAL BALANCE TEARS) 0.4 % SOLN Apply 2 drops to eye 3 (three) times daily.    . ILEVRO 0.3 % ophthalmic suspension Place 1 drop into the left eye daily.     Marland Kitchen lisinopril (PRINIVIL,ZESTRIL) 40 MG tablet Take 1 tablet (40 mg total) by mouth daily. 30 tablet 11  . memantine (NAMENDA XR) 28 MG CP24 24 hr capsule Take 1 capsule (28 mg total) by mouth daily. 30 capsule 6  . MYRBETRIQ 50 MG TB24 tablet Take 50 mg by mouth daily.     . sertraline (ZOLOFT) 25 MG tablet Take 1 tablet (25 mg total) by mouth daily. 30 tablet 6  . simvastatin (ZOCOR) 20 MG tablet Take 1 tablet (20 mg total) by mouth at bedtime. 30 tablet 11  . TOVIAZ 4 MG TB24 tablet Take 4 mg by mouth.      No current facility-administered medications on file prior to visit.    Review of Systems  Constitutional: Negative for fever, chills and appetite change.  Respiratory: Negative for shortness of breath.        Denies orthopnea, PND or DOE  Cardiovascular: Positive for leg swelling. Negative for chest pain and palpitations.  Gastrointestinal: Negative for abdominal distention.  Genitourinary: Negative for difficulty urinating.       Filed Vitals:   08/03/15 1453  BP: 154/48  Pulse: 56  Temp: 97 F (36.1 C)  TempSrc: Oral  Height: 5\' 7"  (1.702 m)  Weight: 197 lb 14.4 oz (89.767 kg)  SpO2: 97%     Objective:   Physical Exam  Constitutional: He is oriented to person, place, and time. He appears well-developed. No distress.  HENT:  Head: Normocephalic and atraumatic.  Cardiovascular: Normal rate, regular rhythm and normal heart sounds.  Exam reveals no gallop and no friction rub.   No murmur heard. Pulmonary/Chest: Effort normal and breath sounds normal. No respiratory distress. He has no wheezes. He has no rales.  Abdominal: Soft. Bowel sounds are normal. He exhibits no distension. There is no tenderness. There is no rebound and no guarding.  Musculoskeletal: He exhibits edema.  He exhibits no tenderness.  3+ pitting edema left leg; 2+ pitting edema right leg; there is no calf tenderness.  Neurological: He is alert and oriented to person, place, and time.  Skin: Skin is warm. He is not diaphoretic. No erythema.  There is a fluid filled blister on the left antero-lateral lower leg.  There are no ulcerations on the legs or feet.  Skin is otherwise non-erythematous, non-scaly, no other venous stasis changes.  Psychiatric: He has a normal mood and affect. His behavior is normal. Judgment and thought content normal.  Vitals reviewed.         Assessment & Plan:  Please see problem based charting for A&P.

## 2015-08-03 NOTE — Patient Instructions (Signed)
1. Please try Juxtalite compression stockings (I will put in order).  Elevate your legs at home.  I am going to stop your amlodipine to see if this helps your swelling.  Please start taking chlorthalidone in place of amlodipine.  Please check blood pressure at home daily and call us if very low or you feel poorly or if top number very high (>150)   2. Please take all medications as prescribed.    3. If you have worsening of your symptoms or new symptoms arise, please call the clinic ([phone]), or go to the ER immediately if symptoms are severe.   Come back to hospital for leg ultrasound in the morning.  Come back to clinic in 2 weeks to check your blood pressure and kidney function on new medication.

## 2015-08-03 NOTE — Telephone Encounter (Signed)
Thank you Leigh. We will assess him this afternoon

## 2015-08-04 ENCOUNTER — Ambulatory Visit (HOSPITAL_COMMUNITY)
Admission: RE | Admit: 2015-08-04 | Discharge: 2015-08-04 | Disposition: A | Discharge status: Home / Self Care | Payer: Commercial Managed Care - HMO | Source: Ambulatory Visit | Attending: Cardiology | Admitting: Cardiology

## 2015-08-04 DIAGNOSIS — E785 Hyperlipidemia, unspecified: Secondary | ICD-10-CM | POA: Diagnosis not present

## 2015-08-04 DIAGNOSIS — E119 Type 2 diabetes mellitus without complications: Secondary | ICD-10-CM | POA: Insufficient documentation

## 2015-08-04 DIAGNOSIS — Z87891 Personal history of nicotine dependence: Secondary | ICD-10-CM | POA: Insufficient documentation

## 2015-08-04 DIAGNOSIS — I10 Essential (primary) hypertension: Secondary | ICD-10-CM | POA: Diagnosis not present

## 2015-08-04 DIAGNOSIS — R600 Localized edema: Secondary | ICD-10-CM | POA: Diagnosis not present

## 2015-08-04 NOTE — Assessment & Plan Note (Addendum)
Increasing left leg edema with small fluid filled blister.  No skin breakdown or ulceration and there are no signs of cellulitis.  There are no blisters on other areas of the skin or systemic signs and symptoms to suggests TEN, pemphigus, bullous pemphigoid.  Given his severe dependent edema and the location of the blister on the leg, the blister seems most likely to be due to his dependent edema.  He has no dyspnea, PND or orthopnea to suggest HF and ECHO in 2010 revealed good systolic function.  His right leg is usually bigger (per chart review and patient and family report) which is c/w his hx of left leg DVT.  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  This is further supported by the fact the patient continues to sit with legs hanging down most of the time and he does not wear his compression stockings because he finds them difficult to put on. - encourage leg elevation - try and avoid friction against the blister (I expect it will improve as the swelling improves, but it may pop); continue good skin care - will order Juxtalite's (compression stockings) which may be easier for him to get on  - will STOP amlodipine for now (he has been on this med for several years but will try off of it to see if it may be contributing to continued swelling); add chlorthalidone for BP - venous duplex left leg (again, less likely acute DVT but this will likely reveal features c/w prior clot and help support suspicion of post-thrombotic syndrome) - follow-up with PCP in two weeks to check his legs and BP/BMP (on new rx)

## 2015-08-04 NOTE — Assessment & Plan Note (Addendum)
BP Readings from Last 3 Encounters:  08/03/15 154/48  06/01/15 156/62  04/07/15 150/44    Lab Results  Component Value Date   NA 143 01/06/2015   K 3.5 01/06/2015   CREATININE 1.15 01/06/2015    Assessment: Blood pressure control:  fair Progress toward BP goal:   improved Comments: Compliant with trx.  Has increasing leg edema.  On amlodipine for years.  Plan: Medications:  STOP amlodipine (in case it is contributing to LE edema).  START chlorthalidone 12.5mg  daily (his HR will not tolerate an increase in BB and he is on max ACEI).  CONTINUE lisinopril 40mg  daily and Coreg 12.5mg  BID. Other plans: He will call if SBP low (<110s) or high (>150s) or if he feels unwell (lightheaded, etc).  Otherwise, RTC in 2 weeks for BP check and BMP on new BP med

## 2015-08-05 ENCOUNTER — Other Ambulatory Visit: Payer: Self-pay | Admitting: Nurse Practitioner

## 2015-08-05 NOTE — Progress Notes (Signed)
Internal Medicine Clinic Attending  Case discussed with Dr. Wilson soon after the resident saw the patient.  We reviewed the resident's history and exam and pertinent patient test results.  I agree with the assessment, diagnosis, and plan of care documented in the resident's note.  

## 2015-08-09 ENCOUNTER — Telehealth: Payer: Self-pay | Admitting: *Deleted

## 2015-08-09 NOTE — Telephone Encounter (Signed)
Call to patient's wife given results of Doppler study.  No DVT on the leg Ultrasound.  Patient to continue elevation of legs and to hold Amlodipine that was stopped last visit.  Patient to get a special type of compression stocking to help with the leg swelling.  Patient to also make an appointment in 2 weeks for follow up to check his B/P off the Amlodipine and to see if the swelling has gone down. Patient's wife voiced understanding of the plan.  Sander Nephew, RN 08/09/2015 1:55 PM

## 2015-08-22 ENCOUNTER — Encounter: Payer: Self-pay | Admitting: Internal Medicine

## 2015-08-22 ENCOUNTER — Ambulatory Visit (INDEPENDENT_AMBULATORY_CARE_PROVIDER_SITE_OTHER): Payer: Commercial Managed Care - HMO | Admitting: Internal Medicine

## 2015-08-22 VITALS — BP 163/52 | HR 48 | Temp 98.1°F | Ht 67.0 in

## 2015-08-22 DIAGNOSIS — R32 Unspecified urinary incontinence: Secondary | ICD-10-CM | POA: Diagnosis not present

## 2015-08-22 DIAGNOSIS — R600 Localized edema: Secondary | ICD-10-CM

## 2015-08-22 DIAGNOSIS — I10 Essential (primary) hypertension: Secondary | ICD-10-CM | POA: Diagnosis not present

## 2015-08-22 DIAGNOSIS — Z79899 Other long term (current) drug therapy: Secondary | ICD-10-CM

## 2015-08-22 MED ORDER — CHLORTHALIDONE 25 MG PO TABS: 25.0000 mg | ORAL_TABLET | Freq: Every day | ORAL | Status: DC

## 2015-08-22 NOTE — Assessment & Plan Note (Signed)
Patient presents for follow up of chronic lower extremity edema L>R. At previous visits, DVT was rule out with lower extremity dopplers. Edema is non-tender. Last echo showed a good EF and lower extremity edema was attributed to venous insufficiency. L is greater than R, likely due to previous left knee surgery. It was recommended that patient wear compression stockings, keep legs elevated, and stop amlodipine. He has not been taking amlodipine, but has not been able to obtain compression stockings yet. He often forgets to elevated his legs due to underlying dementia. He has an appointment with [facility] soon for the stockings.   Plan: -Obtain compression stockings -Elevated legs when able -Increase chlorthalidone to 25 mg daily -Follow up one month

## 2015-08-22 NOTE — Assessment & Plan Note (Signed)
Place new referral for Urology to continue visits.

## 2015-08-22 NOTE — Patient Instructions (Signed)
TAKE CHLORTHALIDONE 25 MG (ONE PILL) ONCE A DAY.  RETURN IN ONE MONTH.

## 2015-08-22 NOTE — Progress Notes (Signed)
Subjective:    Patient ID: [name], male    DOB: 1941-03-03, 74 y.o.   MRN: [id number]  HPI [name] is a 74 y.o. male with PMHx of HTN, TIA, T2DM  who presents to the clinic for follow up for venous insufficiency and HTN. Please see A&P for the status of the patient's chronic medical problems.   Patient presents for follow up of chronic lower extremity edema L>R. At previous visits, DVT was rule out with lower extremity dopplers. Edema is non-tender. Last echo showed a good EF and lower extremity edema was attributed to venous insufficiency. L is greater than R, likely due to previous left knee surgery. It was recommended that patient wear compression stockings, keep legs elevated, and stop amlodipine. He has not been taking amlodipine, but has not been able to obtain compression stockings yet. He often forgets to elevated his legs due to underlying dementia. He has an appointment with [facility] soon for the stocking.   Patient's blood pressure is elevated at 163/52 after stopping amlodipine 10 mg daily and starting chlorthalidone 12.5 mg daily.   Past Medical History  Diagnosis Date  . Hypertension   . 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 TIA (transient ischemic attack)     multiple in past, most recently in 2011  . CHF (congestive heart failure) ([place])   . Depression   . Hyperlipemia   . Stroke [facility]) 2009    left thalamic infarct, residual LUE weakness  . Type II diabetes mellitus ([place])   . Sleep apnea     "my machine [place]; got to get me another one" (09/01/2014)  . Arthritis     "maybe"    Outpatient Encounter Prescriptions as of 08/22/2015  Medication Sig Note  . Aluminum & Magnesium Hydroxide (LIQUID ANTACID PO) Take 30 mLs by mouth every 4 (four) hours as needed (for indegestion).    . carvedilol (COREG) 12.5 MG tablet Take 1  tablet (12.5 mg total) by mouth 2 (two) times daily.   . chlorthalidone (HYGROTON) 25 MG tablet Take 1 tablet (25 mg total) by mouth daily.   . clopidogrel (PLAVIX) 75 MG tablet Take 1 tablet (75 mg total) by mouth daily.   Marland Kitchen donepezil (ARICEPT) 10 MG tablet Take 1 tablet (10 mg total) by mouth at bedtime.   . donepezil (ARICEPT) 10 MG tablet TAKE ONE TABLET BY MOUTH AT BEDTIME   . Hypromellose (NATURAL BALANCE TEARS) 0.4 % SOLN Apply 2 drops to eye 3 (three) times daily.   . ILEVRO 0.3 % ophthalmic suspension Place 1 drop into the left eye daily.  06/01/2015: Received from: External Pharmacy  . lisinopril (PRINIVIL,ZESTRIL) 40 MG tablet Take 1 tablet (40 mg total) by mouth daily.   . memantine (NAMENDA XR) 28 MG CP24 24 hr capsule Take 1 capsule (28 mg total) by mouth daily.   Marland Kitchen MYRBETRIQ 50 MG TB24 tablet Take 50 mg by mouth daily.  06/01/2015: Received from: External Pharmacy  . NAMENDA XR 28 MG CP24 24 hr capsule TAKE ONE CAPSULE BY MOUTH  DAILY   . sertraline (ZOLOFT) 25 MG tablet Take 1 tablet (25 mg total) by mouth daily.   . simvastatin (ZOCOR) 20 MG tablet Take 1 tablet (20 mg total) by mouth at bedtime.   . TOVIAZ 4 MG TB24 tablet Take 4 mg by mouth.  06/01/2015:  Received from: External Pharmacy  . [DISCONTINUED] chlorthalidone (HYGROTON) 25 MG tablet Take 0.5 tablets (12.5 mg total) by mouth daily.    No facility-administered encounter medications on file as of 08/22/2015.    Family History  Problem Relation Age of Onset  . Colon cancer Neg Hx   . Diabetes Sister   . Diabetes Brother   . Diabetes Paternal Aunt   . Stroke Mother   . Pneumonia Father     Social History   Social History  . Marital Status: Married    Spouse Name: [name]  . Number of Children: 3  . Years of Education: HS   Occupational History  . disabled    Social History Main Topics  . Smoking status: Former Smoker -- 1.50 packs/day for 15 years    Types: Cigarettes    Quit date: 10/16/1983  . Smokeless  tobacco: Never Used  . Alcohol Use: No  . Drug Use: No  . Sexual Activity: No   Other Topics Concern  . Not on file   Social History Narrative   Pt lives at home with spouse.   Caffeine Use: 1 cup daily    Review of Systems General: Denies fever, chills Respiratory: Denies SOB, cough Cardiovascular: Denies chest pain and palpitations.  Admits to lower extremity edema b/l L>R, denies pain. Genitourinary: Admits to urinary incontinence. Denies dysuria. Skin: Denies rash and wounds.  Neurological: Denies dizziness, headaches, weakness, lightheadedness    Objective:   Physical Exam Filed Vitals:   08/22/15 1037  BP: 163/52  Pulse: 48  Temp: 98.1 F (36.7 C)  TempSrc: Oral  Height: 5\' 7"  (1.702 m)  SpO2: 99%   General: Vital signs reviewed.  Patient is elderly male, in no acute distress and cooperative with exam.  Cardiovascular: Bradycardic, regular rhythm, S1 normal, S2 normal Pulmonary/Chest: Clear to auscultation bilaterally, no wheezes, rales, or rhonchi. Abdominal: Soft, non-tender, non-distended, BS + Extremities: 2+ pitting edema in left lower extremity edema and 1+ pitting edema in right lower extremity,  pulses symmetric and intact bilaterally. Blister on left shin has ruptured and is well healed without signs of infection. No calf tenderness. Skin: Warm, dry and intact.      Assessment & Plan:   Please see problem based assessment and plan.

## 2015-08-22 NOTE — Assessment & Plan Note (Signed)
BP Readings from Last 3 Encounters:  08/22/15 163/52  08/03/15 154/48  06/01/15 156/62    Lab Results  Component Value Date   NA 143 01/06/2015   K 3.5 01/06/2015   CREATININE 1.15 01/06/2015    Assessment: Blood pressure control:  Uncontrolled Progress toward BP goal:   Above goal Comments: Compliant with chlorthalidone 12.5 mg daily, lisinopril 40 mg daily, and carvedilol 12.5 mg daily.   Plan: Medications:  continue current medications and increase chlorthalidone to 25 mg daily Other plans: Check BMET. Return in one month.

## 2015-08-23 LAB — BMP8+ANION GAP
Anion Gap: 14 mmol/L (ref 10.0–18.0)
BUN / CREAT RATIO: 21 (ref 10–22)
BUN: 23 mg/dL (ref 8–27)
CHLORIDE: 103 mmol/L (ref 97–106)
CO2: 27 mmol/L (ref 18–29)
Calcium: 9.2 mg/dL (ref 8.6–10.2)
Creatinine, Ser: 1.08 mg/dL (ref 0.76–1.27)
GFR calc Af Amer: 79 mL/min/{1.73_m2} (ref >59)
GFR calc non Af Amer: 68 mL/min/{1.73_m2} (ref >59)
GLUCOSE: 147 mg/dL — AB (ref 65–99)
POTASSIUM: 3.7 mmol/L (ref 3.5–5.2)
Sodium: 144 mmol/L (ref 136–144)

## 2015-08-24 NOTE — Progress Notes (Signed)
Internal Medicine Clinic Attending  Case discussed with Dr. Richardson soon after the resident saw the patient.  We reviewed the resident's history and exam and pertinent patient test results.  I agree with the assessment, diagnosis, and plan of care documented in the resident's note. 

## 2015-08-26 ENCOUNTER — Other Ambulatory Visit: Payer: Self-pay | Admitting: Internal Medicine

## 2015-09-16 ENCOUNTER — Ambulatory Visit (INDEPENDENT_AMBULATORY_CARE_PROVIDER_SITE_OTHER): Payer: Commercial Managed Care - HMO | Admitting: Internal Medicine

## 2015-09-16 ENCOUNTER — Encounter: Payer: Self-pay | Admitting: Internal Medicine

## 2015-09-16 VITALS — BP 159/56 | HR 54 | Temp 98.8°F | Ht 67.0 in | Wt 197.7 lb

## 2015-09-16 DIAGNOSIS — R32 Unspecified urinary incontinence: Secondary | ICD-10-CM | POA: Diagnosis not present

## 2015-09-16 DIAGNOSIS — R600 Localized edema: Secondary | ICD-10-CM | POA: Diagnosis not present

## 2015-09-16 DIAGNOSIS — I10 Essential (primary) hypertension: Secondary | ICD-10-CM

## 2015-09-16 MED ORDER — MEDICAL COMPRESSION STOCKINGS MISC
Status: DC
Start: 2015-09-16 — End: 2016-06-08

## 2015-09-16 NOTE — Patient Instructions (Signed)
Mr. Kerin,  It was a pleasure meeting you today.  I have placed an order for your compression stocking on the right leg. And I have also placed referrals for urology and the foot center.  Your blood pressure seems to be well controlled, so we will make any changes to your medications.  Take care, and give [place] a call if we can help you out with anything else, Dr. Melburn Hake

## 2015-09-16 NOTE — Assessment & Plan Note (Signed)
He continues to have urinary incontinence, which his wife prefers to be managed by the urologist. She requested another referral which I placed.

## 2015-09-16 NOTE — Assessment & Plan Note (Signed)
His left lower extremity edema persists. Dopplers of the lower extremities 1 month ago were negative for DVT. He tells me that he sleeps on his bed flat, while dangling his legs onto the floor, which I believe is a big contributor to his edema. He denies any orthopnea, and does not have signs of CHF on exam, besides the isolated lower extremity edema. He had an echo in 2010 that showed an ejection fraction of 65%. Moreover, I believe this is venous insufficiency, and his wife is picking up compression stockings today which I think will help him drastically.

## 2015-09-16 NOTE — Progress Notes (Signed)
Patient ID: [name], male   DOB: 1941-06-04, 74 y.o.   MRN: [id number]     Subjective:   Patient ID: [name] male   DOB: [birth year] 74 y.o.   MRN: [id number]  HPI: [name] is a 74 y.o. man with a history of hypertension, vascular dementia, well-controlled type 2 diabetes, hyperlipidemia, and major depressive disorder presenting for follow-up.  He reports he has been doing quite well. His left leg swelling has been about the same since he was last seen. He got Dopplers back in October that were negative for a DVT. He tells me that he lays on his bed flat, with his feet dangling off the side. He is able to sleep just fine, without orthopnea. His wife is going to pick up compression stockings today. She asked for an order for the right leg as well. She also requests referrals to urology and the foot center which I'm happy to oblige. Neither the patient nor his wife have any other complaints today.  Please see the assessment and plan for the status of the patient's chronic medical problems.  Past Medical History  Diagnosis Date  . Hypertension   . 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 TIA (transient ischemic attack)     multiple in past, most recently in 2011  . CHF (congestive heart failure) ([place])   . Depression   . Hyperlipemia   . Stroke [facility]) 2009    left thalamic infarct, residual LUE weakness  . Type II diabetes mellitus ([place])   . Sleep apnea     "my machine [place]; got to get me another one" (09/01/2014)  . Arthritis     "maybe"   Current Outpatient Prescriptions  Medication Sig Dispense Refill  . Aluminum & Magnesium Hydroxide (LIQUID ANTACID PO) Take 30 mLs by mouth every 4 (four) hours as needed (for indegestion).     . carvedilol (COREG) 12.5 MG tablet Take 1 tablet (12.5 mg total) by mouth 2 (two) times daily. 60 tablet 11  .  chlorthalidone (HYGROTON) 25 MG tablet Take 1 tablet (25 mg total) by mouth daily. 30 tablet 3  . clopidogrel (PLAVIX) 75 MG tablet Take 1 tablet (75 mg total) by mouth daily. 30 tablet 11  . donepezil (ARICEPT) 10 MG tablet Take 1 tablet (10 mg total) by mouth at bedtime. 30 tablet 6  . donepezil (ARICEPT) 10 MG tablet TAKE ONE TABLET BY MOUTH AT BEDTIME 30 tablet 3  . Hypromellose (NATURAL BALANCE TEARS) 0.4 % SOLN Apply 2 drops to eye 3 (three) times daily.    . ILEVRO 0.3 % ophthalmic suspension Place 1 drop into the left eye daily.     Marland Kitchen lisinopril (PRINIVIL,ZESTRIL) 40 MG tablet Take 1 tablet (40 mg total) by mouth daily. 30 tablet 11  . memantine (NAMENDA XR) 28 MG CP24 24 hr capsule Take 1 capsule (28 mg total) by mouth daily. 30 capsule 6  . MYRBETRIQ 50 MG TB24 tablet Take 50 mg by mouth daily.     Marland Kitchen NAMENDA XR 28 MG CP24 24 hr capsule TAKE ONE CAPSULE BY MOUTH  DAILY 30 capsule 3  . sertraline (ZOLOFT) 25 MG tablet Take 1 tablet (25 mg total) by mouth daily. 30 tablet 6  . simvastatin (ZOCOR) 20 MG tablet Take 1 tablet (20 mg total) by mouth at bedtime. 30 tablet 11  .  TOVIAZ 4 MG TB24 tablet Take 4 mg by mouth.      No current facility-administered medications for this visit.   Family History  Problem Relation Age of Onset  . Colon cancer Neg Hx   . Diabetes Sister   . Diabetes Brother   . Diabetes Paternal Aunt   . Stroke Mother   . Pneumonia Father    Social History   Social History  . Marital Status: Married    Spouse Name: [name]  . Number of Children: 3  . Years of Education: HS   Occupational History  . disabled    Social History Main Topics  . Smoking status: Former Smoker -- 1.50 packs/day for 15 years    Types: Cigarettes    Quit date: 10/16/1983  . Smokeless tobacco: Never Used  . Alcohol Use: No  . Drug Use: No  . Sexual Activity: No   Other Topics Concern  . Not on file   Social History Narrative   Pt lives at home with spouse.   Caffeine Use:  1 cup daily   Review of Systems  Constitutional: Negative for fever, chills, weight loss and malaise/fatigue.  HENT: Negative for hearing loss.   Eyes: Negative for blurred vision and double vision.  Respiratory: Negative for cough and shortness of breath.   Cardiovascular: Positive for leg swelling. Negative for chest pain, palpitations, orthopnea and PND.  Gastrointestinal: Negative for heartburn, nausea, vomiting and abdominal pain.  Genitourinary: Negative for dysuria and urgency.  Musculoskeletal: Negative for myalgias and neck pain.  Skin: Negative for itching and rash.  Neurological: Positive for focal weakness. Negative for dizziness, sensory change, seizures and headaches.  Psychiatric/Behavioral: Negative for depression. The patient is not nervous/anxious.    Objective:  Physical Exam: Filed Vitals:   09/16/15 1057 09/16/15 1126  BP: 173/57 159/56  Pulse: 58 54  Temp: 98.8 F (37.1 C)   TempSrc: Oral   Height: 5\' 7"  (1.702 m)   Weight: 197 lb 11.2 oz (89.676 kg)   SpO2: 100%    General: resting in wheelchair comfortably, appropriately conversational HEENT: no scleral icterus, extra-ocular muscles intact, oropharynx without lesions Cardiac: regular rate and rhythm, no rubs, murmurs or gallops. No JVD. Pulm: breathing well, clear to auscultation bilaterally Abd: bowel sounds normal, soft, nondistended, non-tender Ext: warm and well perfused, with 3+ pitting edema in the right leg and 2+ pitting edema in the left leg. Lymph: no cervical or supraclavicular lymphadenopathy Skin: no rash, hair, or nail changes Neuro: alert and oriented X3, cranial nerves II-XII grossly intact, left arm and leg 4/5 strength compared to 5/5 strength on the right  Assessment & Plan:   Please see problem based charting for my assessment and plan.

## 2015-09-16 NOTE — Assessment & Plan Note (Signed)
His blood pressure today was 159/56, and home blood pressures have ranged in the 150s to 160s. He is currently on HCTZ 25 mg, lisinopril 40 mg, carvedilol 12.5 mg, and I did not make any changes to his medications today, as he is very close to the recommendations of being below 150/90, and I did not want to drop his diastolic blood pressure too low, to facilitate a fall. He denies any symptoms of orthostatic hypotension on this regimen.

## 2015-09-20 NOTE — Addendum Note (Signed)
Addended by: Gilles Chiquito B on: 09/20/2015 04:50 PM   Modules accepted: Level of Service

## 2015-09-20 NOTE — Progress Notes (Signed)
Internal Medicine Clinic Attending  I saw and evaluated the patient.  I personally confirmed the key portions of the history and exam documented by Dr. Flores and I reviewed pertinent patient test results.  The assessment, diagnosis, and plan were formulated together and I agree with the documentation in the resident's note.  

## 2015-10-03 ENCOUNTER — Encounter (INDEPENDENT_AMBULATORY_CARE_PROVIDER_SITE_OTHER): Payer: Commercial Managed Care - HMO | Admitting: Ophthalmology

## 2015-10-03 DIAGNOSIS — H35033 Hypertensive retinopathy, bilateral: Secondary | ICD-10-CM | POA: Diagnosis not present

## 2015-10-03 DIAGNOSIS — E11311 Type 2 diabetes mellitus with unspecified diabetic retinopathy with macular edema: Secondary | ICD-10-CM | POA: Diagnosis not present

## 2015-10-03 DIAGNOSIS — I10 Essential (primary) hypertension: Secondary | ICD-10-CM | POA: Diagnosis not present

## 2015-10-03 DIAGNOSIS — E113312 Type 2 diabetes mellitus with moderate nonproliferative diabetic retinopathy with macular edema, left eye: Secondary | ICD-10-CM | POA: Diagnosis not present

## 2015-10-03 DIAGNOSIS — H35372 Puckering of macula, left eye: Secondary | ICD-10-CM

## 2015-10-03 DIAGNOSIS — H43813 Vitreous degeneration, bilateral: Secondary | ICD-10-CM

## 2015-10-03 DIAGNOSIS — H59032 Cystoid macular edema following cataract surgery, left eye: Secondary | ICD-10-CM | POA: Diagnosis not present

## 2015-10-03 DIAGNOSIS — E113211 Type 2 diabetes mellitus with mild nonproliferative diabetic retinopathy with macular edema, right eye: Secondary | ICD-10-CM | POA: Diagnosis not present

## 2015-10-31 DIAGNOSIS — L603 Nail dystrophy: Secondary | ICD-10-CM | POA: Diagnosis not present

## 2015-10-31 DIAGNOSIS — I739 Peripheral vascular disease, unspecified: Secondary | ICD-10-CM | POA: Diagnosis not present

## 2015-10-31 DIAGNOSIS — E1151 Type 2 diabetes mellitus with diabetic peripheral angiopathy without gangrene: Secondary | ICD-10-CM | POA: Diagnosis not present

## 2015-11-10 ENCOUNTER — Emergency Department (HOSPITAL_COMMUNITY): Payer: PPO

## 2015-11-10 ENCOUNTER — Encounter (HOSPITAL_COMMUNITY): Payer: Self-pay | Admitting: *Deleted

## 2015-11-10 ENCOUNTER — Emergency Department (HOSPITAL_COMMUNITY)
Admission: EM | Admit: 2015-11-10 | Discharge: 2015-11-10 | Disposition: A | Discharge status: Home / Self Care | Payer: PPO | Attending: Emergency Medicine | Admitting: Emergency Medicine

## 2015-11-10 DIAGNOSIS — G4730 Sleep apnea, unspecified: Secondary | ICD-10-CM | POA: Diagnosis not present

## 2015-11-10 DIAGNOSIS — Z87891 Personal history of nicotine dependence: Secondary | ICD-10-CM | POA: Diagnosis not present

## 2015-11-10 DIAGNOSIS — Z8673 Personal history of transient ischemic attack (TIA), and cerebral infarction without residual deficits: Secondary | ICD-10-CM | POA: Insufficient documentation

## 2015-11-10 DIAGNOSIS — Z9981 Dependence on supplemental oxygen: Secondary | ICD-10-CM | POA: Insufficient documentation

## 2015-11-10 DIAGNOSIS — E119 Type 2 diabetes mellitus without complications: Secondary | ICD-10-CM | POA: Diagnosis not present

## 2015-11-10 DIAGNOSIS — W1839XA Other fall on same level, initial encounter: Secondary | ICD-10-CM | POA: Insufficient documentation

## 2015-11-10 DIAGNOSIS — Z79899 Other long term (current) drug therapy: Secondary | ICD-10-CM | POA: Diagnosis not present

## 2015-11-10 DIAGNOSIS — Y92009 Unspecified place in unspecified non-institutional (private) residence as the place of occurrence of the external cause: Secondary | ICD-10-CM | POA: Insufficient documentation

## 2015-11-10 DIAGNOSIS — Z7902 Long term (current) use of antithrombotics/antiplatelets: Secondary | ICD-10-CM | POA: Insufficient documentation

## 2015-11-10 DIAGNOSIS — I509 Heart failure, unspecified: Secondary | ICD-10-CM | POA: Diagnosis not present

## 2015-11-10 DIAGNOSIS — Z8739 Personal history of other diseases of the musculoskeletal system and connective tissue: Secondary | ICD-10-CM | POA: Diagnosis not present

## 2015-11-10 DIAGNOSIS — Y9389 Activity, other specified: Secondary | ICD-10-CM | POA: Insufficient documentation

## 2015-11-10 DIAGNOSIS — E785 Hyperlipidemia, unspecified: Secondary | ICD-10-CM | POA: Insufficient documentation

## 2015-11-10 DIAGNOSIS — E876 Hypokalemia: Secondary | ICD-10-CM

## 2015-11-10 DIAGNOSIS — F0390 Unspecified dementia without behavioral disturbance: Secondary | ICD-10-CM | POA: Diagnosis not present

## 2015-11-10 DIAGNOSIS — W19XXXA Unspecified fall, initial encounter: Secondary | ICD-10-CM

## 2015-11-10 DIAGNOSIS — I10 Essential (primary) hypertension: Secondary | ICD-10-CM | POA: Insufficient documentation

## 2015-11-10 DIAGNOSIS — Z043 Encounter for examination and observation following other accident: Secondary | ICD-10-CM | POA: Insufficient documentation

## 2015-11-10 DIAGNOSIS — Y998 Other external cause status: Secondary | ICD-10-CM | POA: Insufficient documentation

## 2015-11-10 DIAGNOSIS — F329 Major depressive disorder, single episode, unspecified: Secondary | ICD-10-CM | POA: Insufficient documentation

## 2015-11-10 LAB — BASIC METABOLIC PANEL
ANION GAP: 8 (ref 5–15)
BUN: 29 mg/dL — ABNORMAL HIGH (ref 6–20)
CHLORIDE: 105 mmol/L (ref 101–111)
CO2: 29 mmol/L (ref 22–32)
Calcium: 8.5 mg/dL — ABNORMAL LOW (ref 8.9–10.3)
Creatinine, Ser: 1.11 mg/dL (ref 0.61–1.24)
GFR calc non Af Amer: >60 mL/min (ref >60)
GLUCOSE: 165 mg/dL — AB (ref 65–99)
Potassium: 2.8 mmol/L — ABNORMAL LOW (ref 3.5–5.1)
Sodium: 142 mmol/L (ref 135–145)

## 2015-11-10 LAB — CBC WITH DIFFERENTIAL/PLATELET
BASOS ABS: 0 10*3/uL (ref 0.0–0.1)
BASOS PCT: 0 %
Eosinophils Absolute: 0 10*3/uL (ref 0.0–0.7)
Eosinophils Relative: 0 %
HEMATOCRIT: 39.9 % (ref 39.0–52.0)
Hemoglobin: 12.4 g/dL — ABNORMAL LOW (ref 13.0–17.0)
LYMPHS PCT: 8 %
Lymphs Abs: 0.5 10*3/uL — ABNORMAL LOW (ref 0.7–4.0)
MCH: 26.1 pg (ref 26.0–34.0)
MCHC: 31.1 g/dL (ref 30.0–36.0)
MCV: 84 fL (ref 78.0–100.0)
MONO ABS: 0.5 10*3/uL (ref 0.1–1.0)
MONOS PCT: 7 %
NEUTROS ABS: 5.5 10*3/uL (ref 1.7–7.7)
NEUTROS PCT: 85 %
Platelets: 152 10*3/uL (ref 150–400)
RBC: 4.75 MIL/uL (ref 4.22–5.81)
RDW: 16 % — AB (ref 11.5–15.5)
WBC: 6.5 10*3/uL (ref 4.0–10.5)

## 2015-11-10 LAB — I-STAT CG4 LACTIC ACID, ED: LACTIC ACID, VENOUS: 1.76 mmol/L (ref 0.5–2.0)

## 2015-11-10 LAB — URINALYSIS, ROUTINE W REFLEX MICROSCOPIC
Bilirubin Urine: NEGATIVE
GLUCOSE, UA: NEGATIVE mg/dL
KETONES UR: NEGATIVE mg/dL
LEUKOCYTES UA: NEGATIVE
Nitrite: NEGATIVE
PH: 5 (ref 5.0–8.0)
Protein, ur: NEGATIVE mg/dL
Specific Gravity, Urine: 1.023 (ref 1.005–1.030)

## 2015-11-10 LAB — URINE MICROSCOPIC-ADD ON: SQUAMOUS EPITHELIAL / LPF: NONE SEEN

## 2015-11-10 LAB — I-STAT TROPONIN, ED: Troponin i, poc: 0.01 ng/mL (ref 0.00–0.08)

## 2015-11-10 MED ORDER — POTASSIUM CHLORIDE CRYS ER 20 MEQ PO TBCR
40.0000 meq | EXTENDED_RELEASE_TABLET | Freq: Once | ORAL | Status: AC
Start: 2015-11-10 — End: 2015-11-10
  Administered 2015-11-10: 40 meq via ORAL
  Filled 2015-11-10: qty 2

## 2015-11-10 MED ORDER — POTASSIUM CHLORIDE CRYS ER 20 MEQ PO TBCR: 20.0000 meq | EXTENDED_RELEASE_TABLET | Freq: Every day | ORAL | Status: DC

## 2015-11-10 MED ORDER — ONDANSETRON HCL 4 MG/2ML IJ SOLN: 4.0000 mg | Freq: Once | INTRAMUSCULAR | Status: DC

## 2015-11-10 NOTE — Discharge Instructions (Signed)
Dementia Dementia is a general term for problems with brain function. A person with dementia has memory loss and a hard time with at least one other brain function such as thinking, speaking, or problem solving. Dementia can affect social functioning, how you do your job, your mood, or your personality. The changes may be hidden for a long time. The earliest forms of this disease are usually not detected by family or friends. Dementia can be:  Irreversible.  Potentially reversible.  Partially reversible.  Progressive. This means it can get worse over time. CAUSES  Irreversible dementia causes may include:  Degeneration of brain cells (Alzheimer disease or Lewy body dementia).  Multiple small strokes (vascular dementia).  Infection (chronic meningitis or Creutzfeldt-Jakob disease).  Frontotemporal dementia. This affects younger people, age 87 to 63, compared to those who have Alzheimer disease.  Dementia associated with other disorders like Parkinson disease, Huntington disease, or HIV-associated dementia. Potentially or partially reversible dementia causes may include:  Medicines.  Metabolic causes such as excessive alcohol intake, vitamin B12 deficiency, or thyroid disease.  Masses or pressure in the brain such as a tumor, blood clot, or hydrocephalus. SIGNS AND SYMPTOMS  Symptoms are often hard to detect. Family members or coworkers may not notice them early in the disease process. Different people with dementia may have different symptoms. Symptoms can include:  A hard time with memory, especially recent memory. Long-term memory may not be impaired.  Asking the same question multiple times or forgetting something someone just said.  A hard time speaking your thoughts or finding certain words.  A hard time solving problems or performing familiar tasks (such as how to use a telephone).  Sudden changes in mood.  Changes in personality, especially increasing moodiness or  mistrust.  Depression.  A hard time understanding complex ideas that were never a problem in the past. DIAGNOSIS  There are no specific tests for dementia.   Your health care provider may recommend a thorough evaluation. This is because some forms of dementia can be reversible. The evaluation will likely include a physical exam and getting a detailed history from you and a family member. The history often gives the best clues and suggestions for a diagnosis.  Memory testing may be done. A detailed brain function evaluation called neuropsychologic testing may be helpful.  Lab tests and brain imaging (such as a CT scan or MRI scan) are sometimes important.  Sometimes observation and re-evaluation over time is very helpful. TREATMENT  Treatment depends on the cause.   If the problem is a vitamin deficiency, it may be helped or cured with supplements.  For dementias such as Alzheimer disease, medicines are available to stabilize or slow the course of the disease. There are no cures for this type of dementia.  Your health care provider can help direct you to groups, organizations, and other health care providers to help with decisions in the care of you or your loved one. HOME CARE INSTRUCTIONS The care of individuals with dementia is varied and dependent upon the progression of the dementia. The following suggestions are intended for the person living with, or caring for, the person with dementia.  Create a safe environment.  Remove the locks on bathroom doors to prevent the person from accidentally locking himself or herself in.  Use childproof latches on kitchen cabinets and any place where cleaning supplies, chemicals, or alcohol are kept.  Use childproof covers in unused electrical outlets.  Install childproof devices to keep doors and windows  secured.  Remove stove knobs or install safety knobs and an automatic shut-off on the stove.  Lower the temperature on water  heaters.  Label medicines and keep them locked up.  Secure knives, lighters, matches, power tools, and guns, and keep these items out of reach.  Keep the house free from clutter. Remove rugs or anything that might contribute to a fall.  Remove objects that might break and hurt the person.  Make sure lighting is good, both inside and outside.  Install grab rails as needed.  Use a monitoring device to alert you to falls or other needs for help.  Reduce confusion.  Keep familiar objects and people around.  Use night lights or dim lights at night.  Label items or areas.  Use reminders, notes, or directions for daily activities or tasks.  Keep a simple, consistent routine for waking, meals, bathing, dressing, and bedtime.  Create a calm, quiet environment.  Place large clocks and calendars prominently.  Display emergency numbers and home address near all telephones.  Use cues to establish different times of the day. An example is to open curtains to let the natural light in during the day.   Use effective communication.  Choose simple words and short sentences.  Use a gentle, calm tone of voice.  Be careful not to interrupt.  If the person is struggling to find a word or communicate a thought, try to provide the word or thought.  Ask one question at a time. Allow the person ample time to answer questions. Repeat the question again if the person does not respond.  Reduce nighttime restlessness.  Provide a comfortable bed.  Have a consistent nighttime routine.  Ensure a regular walking or physical activity schedule. Involve the person in daily activities as much as possible.  Limit napping during the day.  Limit caffeine.  Attend social events that stimulate rather than overwhelm the senses.  Encourage good nutrition and hydration.  Reduce distractions during meal times and snacks.  Avoid foods that are too hot or too cold.  Monitor chewing and swallowing  ability.  Continue with routine vision, hearing, dental, and medical screenings.  Give medicines only as directed by the health care provider.  Monitor driving abilities. Do not allow the person to drive when safe driving is no longer possible.  Register with an identification program which could provide location assistance in the event of a missing person situation. SEEK MEDICAL CARE IF:   New behavioral problems start such as moodiness, aggressiveness, or seeing things that are not there (hallucinations).  Any new problem with brain function happens. This includes problems with balance, speech, or falling a lot.  Problems with swallowing develop.  Any symptoms of other illness happen. Small changes or worsening in any aspect of brain function can be a sign that the illness is getting worse. It can also be a sign of another medical illness such as infection. Seeing a health care provider right away is important. SEEK IMMEDIATE MEDICAL CARE IF:   A fever develops.  New or worsened confusion develops.  New or worsened sleepiness develops.  Staying awake becomes hard to do.   This information is not intended to replace advice given to you by your health care provider. Make sure you discuss any questions you have with your health care provider.   Document Released: 03/27/2001 Document Revised: 10/22/2014 Document Reviewed: 02/26/2011 Elsevier Interactive Patient Education 2016 [place].  Potassium Content of Foods Potassium is a mineral found in  many foods and drinks. It helps keep fluids and minerals balanced in your body and affects how steadily your heart beats. Potassium also helps control your blood pressure and keep your muscles and nervous system healthy. Certain health conditions and medicines may change the balance of potassium in your body. When this happens, you can help balance your level of potassium through the foods that you do or do not eat. Your health care  provider or dietitian may recommend an amount of potassium that you should have each day. The following lists of foods provide the amount of potassium (in parentheses) per serving in each item. HIGH IN POTASSIUM  The following foods and beverages have 200 mg or more of potassium per serving:  Apricots, 2 raw or 5 dry (200 mg).  Artichoke, 1 medium (345 mg).  Avocado, raw,  each (245 mg).  Banana, 1 medium (425 mg).  Beans, lima, or baked beans, canned,  cup (280 mg).  Beans, white, canned,  cup (595 mg).  Beef roast, 3 oz (320 mg).  Beef, ground, 3 oz (270 mg).  Beets, raw or cooked,  cup (260 mg).  Bran muffin, 2 oz (300 mg).  Broccoli,  cup (230 mg).  Brussels sprouts,  cup (250 mg).  Cantaloupe,  cup (215 mg).  Cereal, 100% bran,  cup (200-400 mg).  Cheeseburger, single, fast food, 1 each (225-400 mg).  Chicken, 3 oz (220 mg).  Clams, canned, 3 oz (535 mg).  Crab, 3 oz (225 mg).  Dates, 5 each (270 mg).  Dried beans and peas,  cup (300-475 mg).  Figs, dried, 2 each (260 mg).  Fish: halibut, tuna, cod, snapper, 3 oz (480 mg).  Fish: salmon, haddock, swordfish, perch, 3 oz (300 mg).  Fish, tuna, canned 3 oz (200 mg).  [place] fries, fast food, 3 oz (470 mg).  Granola with fruit and nuts,  cup (200 mg).  Grapefruit juice,  cup (200 mg).  Greens, beet,  cup (655 mg).  Honeydew melon,  cup (200 mg).  Kale, raw, 1 cup (300 mg).  Kiwi, 1 medium (240 mg).  Kohlrabi, rutabaga, parsnips,  cup (280 mg).  Lentils,  cup (365 mg).  Mango, 1 each (325 mg).  Milk, chocolate, 1 cup (420 mg).  Milk: nonfat, low-fat, whole, buttermilk, 1 cup (350-380 mg).  Molasses, 1 Tbsp (295 mg).  Mushrooms,  cup (280) mg.  Nectarine, 1 each (275 mg).  Nuts: almonds, peanuts, hazelnuts, [place], cashew, mixed, 1 oz (200 mg).  Nuts, pistachios, 1 oz (295 mg).  Orange, 1 each (240 mg).  Orange juice,  cup (235 mg).  Papaya, medium,  fruit  (390 mg).  Peanut butter, chunky, 2 Tbsp (240 mg).  Peanut butter, smooth, 2 Tbsp (210 mg).  Pear, 1 medium (200 mg).  Pomegranate, 1 whole (400 mg).  Pomegranate juice,  cup (215 mg).  Pork, 3 oz (350 mg).  Potato chips, salted, 1 oz (465 mg).  Potato, baked with skin, 1 medium (925 mg).  Potatoes, boiled,  cup (255 mg).  Potatoes, mashed,  cup (330 mg).  Prune juice,  cup (370 mg).  Prunes, 5 each (305 mg).  Pudding, chocolate,  cup (230 mg).  Pumpkin, canned,  cup (250 mg).  Raisins, seedless,  cup (270 mg).  Seeds, sunflower or pumpkin, 1 oz (240 mg).  Soy milk, 1 cup (300 mg).  Spinach,  cup (420 mg).  Spinach, canned,  cup (370 mg).  Sweet potato, baked with skin, 1 medium (450 mg).  Swiss chard,  cup (480 mg).  Tomato or vegetable juice,  cup (275 mg).  Tomato sauce or puree,  cup (400-550 mg).  Tomato, raw, 1 medium (290 mg).  Tomatoes, canned,  cup (200-300 mg).  [place], 3 oz (250 mg).  Wheat germ, 1 oz (250 mg).  Winter squash,  cup (250 mg).  Yogurt, plain or fruited, 6 oz (260-435 mg).  Zucchini,  cup (220 mg). MODERATE IN POTASSIUM The following foods and beverages have 50-200 mg of potassium per serving:  Apple, 1 each (150 mg).  Apple juice,  cup (150 mg).  Applesauce,  cup (90 mg).  Apricot nectar,  cup (140 mg).  Asparagus, small spears,  cup or 6 spears (155 mg).  Bagel, cinnamon raisin, 1 each (130 mg).  Bagel, egg or plain, 4 in., 1 each (70 mg).  Beans, green,  cup (90 mg).  Beans, yellow,  cup (190 mg).  Beer, regular, 12 oz (100 mg).  Beets, canned,  cup (125 mg).  Blackberries,  cup (115 mg).  Blueberries,  cup (60 mg).  Bread, whole wheat, 1 slice (70 mg).  Broccoli, raw,  cup (145 mg).  Cabbage,  cup (150 mg).  Carrots, cooked or raw,  cup (180 mg).  Cauliflower, raw,  cup (150 mg).  Celery, raw,  cup (155 mg).  Cereal, bran flakes, cup (120-150 mg).  Cheese,  cottage,  cup (110 mg).  Cherries, 10 each (150 mg).  Chocolate, 1 oz bar (165 mg).  Coffee, brewed 6 oz (90 mg).  Corn,  cup or 1 ear (195 mg).  Cucumbers,  cup (80 mg).  Egg, large, 1 each (60 mg).  Eggplant,  cup (60 mg).  Endive, raw, cup (80 mg).  English muffin, 1 each (65 mg).  Fish, orange roughy, 3 oz (150 mg).  Frankfurter, beef or pork, 1 each (75 mg).  Fruit cocktail,  cup (115 mg).  Grape juice,  cup (170 mg).  Grapefruit,  fruit (175 mg).  Grapes,  cup (155 mg).  Greens: kale, turnip, collard,  cup (110-150 mg).  Ice cream or frozen yogurt, chocolate,  cup (175 mg).  Ice cream or frozen yogurt, vanilla,  cup (120-150 mg).  Lemons, limes, 1 each (80 mg).  Lettuce, all types, 1 cup (100 mg).  Mixed vegetables,  cup (150 mg).  Mushrooms, raw,  cup (110 mg).  Nuts: walnuts, pecans, or macadamia, 1 oz (125 mg).  Oatmeal,  cup (80 mg).  Okra,  cup (110 mg).  Onions, raw,  cup (120 mg).  Peach, 1 each (185 mg).  Peaches, canned,  cup (120 mg).  Pears, canned,  cup (120 mg).  Peas, green, frozen,  cup (90 mg).  Peppers, green,  cup (130 mg).  Peppers, red,  cup (160 mg).  Pineapple juice,  cup (165 mg).  Pineapple, fresh or canned,  cup (100 mg).  Plums, 1 each (105 mg).  Pudding, vanilla,  cup (150 mg).  Raspberries,  cup (90 mg).  Rhubarb,  cup (115 mg).  Rice, wild,  cup (80 mg).  Shrimp, 3 oz (155 mg).  Spinach, raw, 1 cup (170 mg).  Strawberries,  cup (125 mg).  Summer squash  cup (175-200 mg).  Swiss chard, raw, 1 cup (135 mg).  Tangerines, 1 each (140 mg).  Tea, brewed, 6 oz (65 mg).  Turnips,  cup (140 mg).  Watermelon,  cup (85 mg).  Wine, red, table, 5 oz (180 mg).  Wine, white, table, 5 oz (100  mg). LOW IN POTASSIUM The following foods and beverages have less than 50 mg of potassium per serving.  Bread, white, 1 slice (30 mg).  Carbonated beverages, 12 oz (less  than 5 mg).  Cheese, 1 oz (20-30 mg).  Cranberries,  cup (45 mg).  Cranberry juice cocktail,  cup (20 mg).  Fats and oils, 1 Tbsp (less than 5 mg).  Hummus, 1 Tbsp (32 mg).  Nectar: papaya, mango, or pear,  cup (35 mg).  Rice, white or brown,  cup (50 mg).  Spaghetti or macaroni,  cup cooked (30 mg).  Tortilla, flour or corn, 1 each (50 mg).  Waffle, 4 in., 1 each (50 mg).  Water chestnuts,  cup (40 mg).   This information is not intended to replace advice given to you by your health care provider. Make sure you discuss any questions you have with your health care provider.   Document Released: 05/15/2005 Document Revised: 10/06/2013 Document Reviewed: 08/28/2013 Elsevier Interactive Patient Education 2016 [employer].  Hypokalemia Hypokalemia means that the amount of potassium in the blood is lower than normal.Potassium is a chemical, called an electrolyte, that helps regulate the amount of fluid in the body. It also stimulates muscle contraction and helps nerves function properly.Most of the body's potassium is inside of cells, and only a very small amount is in the blood. Because the amount in the blood is so small, minor changes can be life-threatening. CAUSES  Antibiotics.  Diarrhea or vomiting.  Using laxatives too much, which can cause diarrhea.  Chronic kidney disease.  Water pills (diuretics).  Eating disorders (bulimia).  Low magnesium level.  Sweating a lot. SIGNS AND SYMPTOMS  Weakness.  Constipation.  Fatigue.  Muscle cramps.  Mental confusion.  Skipped heartbeats or irregular heartbeat (palpitations).  Tingling or numbness. DIAGNOSIS  Your health care provider can diagnose hypokalemia with blood tests. In addition to checking your potassium level, your health care provider may also check other lab tests. TREATMENT Hypokalemia can be treated with potassium supplements taken by mouth or adjustments in your current medicines. If  your potassium level is very low, you may need to get potassium through a vein (IV) and be monitored in the hospital. A diet high in potassium is also helpful. Foods high in potassium are:  Nuts, such as peanuts and pistachios.  Seeds, such as sunflower seeds and pumpkin seeds.  Peas, lentils, and lima beans.  Whole grain and bran cereals and breads.  Fresh fruit and vegetables, such as apricots, avocado, bananas, cantaloupe, kiwi, oranges, tomatoes, asparagus, and potatoes.  Orange and tomato juices.  Red meats.  Fruit yogurt. HOME CARE INSTRUCTIONS  Take all medicines as prescribed by your health care provider.  Maintain a healthy diet by including nutritious food, such as fruits, vegetables, nuts, whole grains, and lean meats.  If you are taking a laxative, be sure to follow the directions on the label. SEEK MEDICAL CARE IF:  Your weakness gets worse.  You feel your heart pounding or racing.  You are vomiting or having diarrhea.  You are diabetic and having trouble keeping your blood glucose in the normal range. SEEK IMMEDIATE MEDICAL CARE IF:  You have chest pain, shortness of breath, or dizziness.  You are vomiting or having diarrhea for more than 2 days.  You faint. MAKE SURE YOU:   Understand these instructions.  Will watch your condition.  Will get help right away if you are not doing well or get worse.   This information is  not intended to replace advice given to you by your health care provider. Make sure you discuss any questions you have with your health care provider.   Document Released: 10/01/2005 Document Revised: 10/22/2014 Document Reviewed: 04/03/2013 Elsevier Interactive Patient Education [employer].

## 2015-11-10 NOTE — Care Management Note (Signed)
Case Management Note  Patient Details  Name: [name] MRN: [id number] Date of Birth: 09-13-1941  Subjective/Objective:    Patient presents to Ed for increasing weakness, foul smelling urine.  EDCM spoke to patient and his daughter at bedside.  Patient agreeable to home health services with [facility].  Patient's daughter also requesting wheelchair access ramp and bedside commode.  Patient's daughter reports patient has an aide who comes M-W-F for and hour to assist with ADL's. Good number to reach patient [phone].                 Action/Plan:  EDCM spoke to EDP who will place hom ehealth orders for RN, PT, OT aide and social work.  Orders placed for access ramp and 3 in 1 commode per patient's daughter request.  [facility] faxed home health orders to [facility] with confirmation of receipt.     Expected Discharge Date:                  Expected Discharge Plan:  [place]  In-House Referral:     Discharge planning Services     Post Acute Care Choice:  Durable Medical Equipment Choice offered to:  Patient, Adult Children  DME Arranged:  3-N-1, Access ramp DME Agency:  [place]:  RN, PT, OT, Nurse's Aide, Social Work [employer] Agency:  [place]  Status of Service:  Completed, signed off  Medicare Important Message Given:    Date Medicare IM Given:    Medicare IM give by:    Date Additional Medicare IM Given:    Additional Medicare Important Message give by:     If discussed at [place] of Stay Meetings, dates discussed:    Additional CommentsLivia Snellen, RN 11/10/2015, 5:00 PM

## 2015-11-10 NOTE — ED Notes (Signed)
Per EMS - patient comes from home where he lives with his wife and a home health aid.  Twice today patient "just slid down" to floor from standing position.  Patient is uncertain why.  The first "fall," patient was found on floor 4 hours after he went to floor by wife.  The second "fall" was @ 11 am today as patient tried to ambulate to bathroom.  This episode was witnessed.  Patient denies pain and has no bony deformities.  Patient's family and home health aide state patient is more weak than usual and he has had a recent increase in baseline incontinence.  Patient has hx of HTN, CVA (residual left side weakness), dementia, DM (CBG 149 on scene), incontinence, pedal edema and dyslipidemia.  Vitals on scene, 138/62, HR 80, RR 16.  RA sats 96%.

## 2015-11-10 NOTE — ED Notes (Addendum)
Patient presents from home with c/o weakness and increased confusion x1 week.  Patient has dementia and denies any complaints, but wife states she has found patient on floor twice today.  Wife also notes that patient's urine has smelled strongly and he has had increased frequency today. Unknown if patient hit head during falls.  General: Awake, alert, well-nourished, NAD Cardio: RRR, S1S2 heard, no murmurs appreciated, no LE edema, cyanosis or clubbing; +2 pulses bilaterally Pulm: CTAB, no wheezes, rhonchi or rales GI: soft, NT/ND,+BS x4, no hepatomegaly, no splenomegaly Extremities: MAE Skin: dry, intact, no rashes or lesions Neuro: Strength and sensation grossly intact.  Grips = bilat, no arm drift, moves both LE well with equal strength bilat, no dysmetria, tongue midline, smile symmetric, no facial droop.  PERRL 38mm.

## 2015-11-10 NOTE — ED Provider Notes (Signed)
CSN: [id number]     Arrival date & time 11/10/15  1206 History   First MD Initiated Contact with Patient 11/10/15 1411     Chief Complaint  Patient presents with  . Weakness  . Fall     (Consider location/radiation/quality/duration/timing/severity/associated sxs/prior Treatment) HPI   [name] is a 75 y.o. male who presents for evaluation of weakness, confusion, and fall. Patient's wife found him on the floor, where she feels he spent the whole night. Causes of fall is not clear. She was able to get him up with help from family member. He again fell later this morning, and she subsequently brought him here for evaluation. He ambulates with a walker usually. He has ongoing and worsening urinary incontinence, for several months. She feels that he rushes to get the bathroom but cannot hold it so spontaneously voids. She feels like his urine has a follow-up at this time. This has been evaluated and treated by a urologist, without improvement. Patient has chronic baseline dementia, and his mental status is unchanged.    Level V caveat- dementia    Past Medical History  Diagnosis Date  . Hypertension   . 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 TIA (transient ischemic attack)     multiple in past, most recently in 2011  . CHF (congestive heart failure) ([place])   . Depression   . Hyperlipemia   . Stroke [facility]) 2009    left thalamic infarct, residual LUE weakness  . Type II diabetes mellitus ([place])   . Sleep apnea     "my machine [place]; got to get me another one" (09/01/2014)  . Arthritis     "maybe"   Past Surgical History  Procedure Laterality Date  . Knee arthroscopy Left   . Shoulder arthroscopy w/ rotator cuff repair Right   . 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 Cardiac catheterization  07/2010  . Craniotomy  05/2008    for clipping of an  incidental asymptomatic unruptured/notes 02/13/2011   Family History  Problem Relation Age of Onset  . Colon cancer Neg Hx   . Diabetes Sister   . Diabetes Brother   . Diabetes Paternal Aunt   . Stroke Mother   . Pneumonia Father    Social History  Substance Use Topics  . Smoking status: Former Smoker -- 1.50 packs/day for 15 years    Types: Cigarettes    Quit date: 10/16/1983  . Smokeless tobacco: Never Used  . Alcohol Use: No    Review of Systems  Unable to perform ROS: Dementia      Allergies  Pioglitazone and Rosiglitazone maleate  Home Medications   Prior to Admission medications   Medication Sig Start Date End Date Taking? Authorizing Provider  Aluminum & Magnesium Hydroxide (LIQUID ANTACID PO) Take 30 mLs by mouth every 4 (four) hours as needed (for indegestion).    Yes Historical Provider, MD  carvedilol (COREG) 12.5 MG tablet Take 1 tablet (12.5 mg total) by mouth 2 (two) times daily. 01/06/15  Yes Nischal [name], MD  chlorthalidone (HYGROTON) 25 MG tablet Take 1 tablet (25 mg total) by mouth daily. 08/22/15  Yes Alexa Sherral Hammers, MD  clopidogrel (PLAVIX) 75 MG tablet Take 1 tablet (75 mg total) by mouth daily. 06/01/15  Yes Dennie Bible, NP  donepezil (ARICEPT) 10 MG tablet Take 1 tablet (10 mg  total) by mouth at bedtime. 06/01/15  Yes Dennie Bible, NP  Elastic Bandages & Supports (MEDICAL COMPRESSION STOCKINGS) [place] Apply daily 09/16/15  Yes Loleta Chance, MD  Hypromellose (NATURAL BALANCE TEARS) 0.4 % SOLN Apply 2 drops to eye 3 (three) times daily.   Yes Historical Provider, MD  ILEVRO 0.3 % ophthalmic suspension Place 1 drop into the left eye daily.  04/13/15  Yes Historical Provider, MD  lisinopril (PRINIVIL,ZESTRIL) 40 MG tablet Take 1 tablet (40 mg total) by mouth daily. 01/06/15  Yes Nischal [name], MD  memantine (NAMENDA XR) 28 MG CP24 24 hr capsule Take 1 capsule (28 mg total) by mouth daily. 06/01/15  Yes Dennie Bible, NP  MYRBETRIQ 50  MG TB24 tablet Take 50 mg by mouth daily.  04/29/15  Yes Historical Provider, MD  sertraline (ZOLOFT) 25 MG tablet Take 1 tablet (25 mg total) by mouth daily. 06/01/15  Yes Dennie Bible, NP  simvastatin (ZOCOR) 20 MG tablet Take 1 tablet (20 mg total) by mouth at bedtime. 01/06/15  Yes Nischal Narendra, MD  TOVIAZ 4 MG TB24 tablet Take 4 mg by mouth daily.  04/29/15  Yes Historical Provider, MD  potassium chloride SA (K-DUR,KLOR-CON) 20 MEQ tablet Take 1 tablet (20 mEq total) by mouth daily. 11/10/15   Daleen Bo, MD   BP 163/73 mmHg  Pulse 77  Temp(Src) 99.3 F (37.4 C) (Rectal)  Resp 10  SpO2 95% Physical Exam  Constitutional: He appears well-developed.  Elderly, frail  HENT:  Head: Normocephalic and atraumatic.  Right Ear: External ear normal.  Left Ear: External ear normal.  Eyes: Conjunctivae and EOM are normal. Pupils are equal, round, and reactive to light.  Neck: Normal range of motion and phonation normal. Neck supple.  Cardiovascular: Normal rate, regular rhythm and normal heart sounds.   Pulmonary/Chest: Effort normal and breath sounds normal. He exhibits no bony tenderness.  Abdominal: Soft. There is no tenderness.  Musculoskeletal: Normal range of motion.  He is able to lift and stabilize both arms and legs off the stretcher independently, easily. He can sit in the stretcher, and hold himself in that position.  Neurological: He is alert. No cranial nerve deficit or sensory deficit. He exhibits normal muscle tone. Coordination normal.  No dysarthria. No focal asymmetry of strength.  Skin: Skin is warm, dry and intact.  Psychiatric: He has a normal mood and affect. His behavior is normal.  Nursing note and vitals reviewed.   ED Course  Procedures (including critical care time)  Medications  ondansetron (ZOFRAN) injection 4 mg (not administered)  potassium chloride SA (K-DUR,KLOR-CON) CR tablet 40 mEq (40 mEq Oral Given 11/10/15 1645)    Patient Vitals for the  past 24 hrs:  BP Temp Temp src Pulse Resp SpO2  11/10/15 1454 - 99.3 F (37.4 C) Rectal - - -  11/10/15 1400 163/73 mmHg - - 77 10 95 %  11/10/15 1330 173/78 mmHg - - 80 14 95 %  11/10/15 1217 177/86 mmHg 100.2 F (37.9 C) Oral 86 16 96 %    Nurse case manager from ED, so the patient and help to arrange in-home services.  I requested evaluation at home by home health services.    5:03 PM Reevaluation with update and discussion. After initial assessment and treatment, an updated evaluation reveals clinical status unchanged. He remains alert and conversant but confused. Findings discussed with patient's family members who agree with the plan. [place]  Reviewed  CBC WITH DIFFERENTIAL/PLATELET - Abnormal; Notable for the following:    Hemoglobin 12.4 (*)    RDW 16.0 (*)    Lymphs Abs 0.5 (*)    All other components within normal limits  BASIC METABOLIC PANEL - Abnormal; Notable for the following:    Potassium 2.8 (*)    Glucose, Bld 165 (*)    BUN 29 (*)    Calcium 8.5 (*)    All other components within normal limits  URINALYSIS, ROUTINE W REFLEX MICROSCOPIC (NOT AT [facility]) - Abnormal; Notable for the following:    APPearance CLOUDY (*)    Hgb urine dipstick TRACE (*)    All other components within normal limits  URINE MICROSCOPIC-ADD ON - Abnormal; Notable for the following:    Bacteria, UA RARE (*)    All other components within normal limits  I-STAT TROPOININ, ED  I-STAT CG4 LACTIC ACID, ED    Imaging Review Dg Chest 2 View  11/10/2015  CLINICAL DATA:  Patient presents from home with c/o weakness and increased confusion x1 week. Patient has dementia and denies any complaints, but wife states she has found patient on floor twice today. EXAM: CHEST  2 VIEW COMPARISON:  11/01/2013 FINDINGS: Normal mediastinum and cardiac silhouette. There is mild central venous congestion. No effusion, infiltrate or pneumothorax. Chronic elevation LEFT  hemidiaphragm. IMPRESSION: No acute cardiopulmonary process. Electronically Signed   By: Suzy Bouchard M.D.   On: 11/10/2015 14:49   Ct Head Wo Contrast  11/10/2015  CLINICAL DATA:  patient comes from home where he lives with his wife and a home health aid. Twice today patient "just slid down" to floor from standing position. Patient is uncertain why. The first "fall," patient was found on floor 4 hours EXAM: CT HEAD WITHOUT CONTRAST TECHNIQUE: Contiguous axial images were obtained from the base of the skull through the vertex without intravenous contrast. COMPARISON:  Head CT 02/16/2014 FINDINGS: Aneurysmal coil in the RIGHT middle cranial fossa. Craniotomy flap in the RIGHT frontal temporal bone. No acute intracranial hemorrhage. No focal mass lesion. No CT evidence of acute infarction. No midline shift or mass effect. No hydrocephalus. Basilar cisterns are patent. There are periventricular white matter hypodensities. There is white matter hypodensities in the RIGHT external capsule and extending into the lentiform nucleus on the RIGHT. Paranasal sinuses and  mastoid air cells are clear. IMPRESSION: 1. No acute intracranial findings. 2. Chronic infarction in the deep white matter and basal ganglia on the RIGHT. 3. Aneurysm clipping in the RIGHT middle cranial fossa. Electronically Signed   By: Suzy Bouchard M.D.   On: 11/10/2015 15:48   I have personally reviewed and evaluated these images and lab results as part of my medical decision-making.   EKG Interpretation   Date/Time:  Thursday November 10 2015 12:29:14 EST Ventricular Rate:  80 PR Interval:  258 QRS Duration: 162 QT Interval:  430 QTC Calculation: 496 R Axis:   -88 Text Interpretation:  Sinus rhythm Prolonged PR interval IVCD, consider  atypical RBBB Probable lateral infarct, age indeterminate since last  tracing no significant change Confirmed by Eulis Foster  MD, Vannak Montenegro [phone]) on  11/10/2015 4:31:07 PM      MDM   Final  diagnoses:  Fall, initial encounter  Dementia, without behavioral disturbance  Hypokalemia    Multiple falls, with apparent normal strength. While in the emergency department. Patient likely has confusion contributing to falls. No apparent ongoing infection, metabolic instability or intravascular volume abnormalities. Doubt CVA, normal  pressure hydrocephalus or pneumonia.  Nursing Notes Reviewed/ Care Coordinated, and agree without changes. Applicable Imaging Reviewed.  Interpretation of Laboratory Data incorporated into ED treatment  Nursing Notes Reviewed/ Care Coordinated Applicable Imaging Reviewed Interpretation of Laboratory Data incorporated into ED treatment  The patient appears reasonably screened and/or stabilized for discharge and I doubt any other medical condition or other [facility] requiring further screening, evaluation, or treatment in the ED at this time prior to discharge.  Plan: Home Medications- potassium; Home Treatments- rest, assistance without; return here if the recommended treatment, does not improve the symptoms; Recommended follow up- PCP. Evaluation for checkup in one week   Daleen Bo, MD 11/10/15 [phone]

## 2015-11-12 DIAGNOSIS — F0390 Unspecified dementia without behavioral disturbance: Secondary | ICD-10-CM | POA: Diagnosis not present

## 2015-11-12 DIAGNOSIS — R260 Ataxic gait: Secondary | ICD-10-CM | POA: Diagnosis not present

## 2015-11-12 DIAGNOSIS — E785 Hyperlipidemia, unspecified: Secondary | ICD-10-CM | POA: Diagnosis not present

## 2015-11-12 DIAGNOSIS — E119 Type 2 diabetes mellitus without complications: Secondary | ICD-10-CM | POA: Diagnosis not present

## 2015-11-12 DIAGNOSIS — I10 Essential (primary) hypertension: Secondary | ICD-10-CM | POA: Diagnosis not present

## 2015-11-12 DIAGNOSIS — Z8673 Personal history of transient ischemic attack (TIA), and cerebral infarction without residual deficits: Secondary | ICD-10-CM | POA: Diagnosis not present

## 2015-11-12 DIAGNOSIS — I509 Heart failure, unspecified: Secondary | ICD-10-CM | POA: Diagnosis not present

## 2015-11-15 NOTE — Progress Notes (Signed)
EDCM called patient for follow up and spoke to patient's wife.  Patient's wife confirms that [facility] has contacted them and has seen the patient.  EDCM encouraged patient's wife to call [facility] to check on status of equipment.  Patient's wife verbalized understanding.  No further EDCM needs at this time. 

## 2015-11-16 DIAGNOSIS — I10 Essential (primary) hypertension: Secondary | ICD-10-CM | POA: Diagnosis not present

## 2015-11-16 DIAGNOSIS — E785 Hyperlipidemia, unspecified: Secondary | ICD-10-CM | POA: Diagnosis not present

## 2015-11-16 DIAGNOSIS — R260 Ataxic gait: Secondary | ICD-10-CM | POA: Diagnosis not present

## 2015-11-16 DIAGNOSIS — Z8673 Personal history of transient ischemic attack (TIA), and cerebral infarction without residual deficits: Secondary | ICD-10-CM | POA: Diagnosis not present

## 2015-11-16 DIAGNOSIS — I509 Heart failure, unspecified: Secondary | ICD-10-CM | POA: Diagnosis not present

## 2015-11-16 DIAGNOSIS — F0390 Unspecified dementia without behavioral disturbance: Secondary | ICD-10-CM | POA: Diagnosis not present

## 2015-11-16 DIAGNOSIS — E119 Type 2 diabetes mellitus without complications: Secondary | ICD-10-CM | POA: Diagnosis not present

## 2015-11-17 DIAGNOSIS — E785 Hyperlipidemia, unspecified: Secondary | ICD-10-CM | POA: Diagnosis not present

## 2015-11-17 DIAGNOSIS — I509 Heart failure, unspecified: Secondary | ICD-10-CM | POA: Diagnosis not present

## 2015-11-17 DIAGNOSIS — F0390 Unspecified dementia without behavioral disturbance: Secondary | ICD-10-CM | POA: Diagnosis not present

## 2015-11-17 DIAGNOSIS — E119 Type 2 diabetes mellitus without complications: Secondary | ICD-10-CM | POA: Diagnosis not present

## 2015-11-17 DIAGNOSIS — R260 Ataxic gait: Secondary | ICD-10-CM | POA: Diagnosis not present

## 2015-11-17 DIAGNOSIS — I10 Essential (primary) hypertension: Secondary | ICD-10-CM | POA: Diagnosis not present

## 2015-11-17 DIAGNOSIS — Z8673 Personal history of transient ischemic attack (TIA), and cerebral infarction without residual deficits: Secondary | ICD-10-CM | POA: Diagnosis not present

## 2015-11-18 DIAGNOSIS — F0390 Unspecified dementia without behavioral disturbance: Secondary | ICD-10-CM | POA: Diagnosis not present

## 2015-11-18 DIAGNOSIS — E119 Type 2 diabetes mellitus without complications: Secondary | ICD-10-CM | POA: Diagnosis not present

## 2015-11-18 DIAGNOSIS — E785 Hyperlipidemia, unspecified: Secondary | ICD-10-CM | POA: Diagnosis not present

## 2015-11-18 DIAGNOSIS — R260 Ataxic gait: Secondary | ICD-10-CM | POA: Diagnosis not present

## 2015-11-18 DIAGNOSIS — I509 Heart failure, unspecified: Secondary | ICD-10-CM | POA: Diagnosis not present

## 2015-11-18 DIAGNOSIS — I10 Essential (primary) hypertension: Secondary | ICD-10-CM | POA: Diagnosis not present

## 2015-11-18 DIAGNOSIS — Z8673 Personal history of transient ischemic attack (TIA), and cerebral infarction without residual deficits: Secondary | ICD-10-CM | POA: Diagnosis not present

## 2015-11-21 DIAGNOSIS — Z8673 Personal history of transient ischemic attack (TIA), and cerebral infarction without residual deficits: Secondary | ICD-10-CM | POA: Diagnosis not present

## 2015-11-21 DIAGNOSIS — I10 Essential (primary) hypertension: Secondary | ICD-10-CM | POA: Diagnosis not present

## 2015-11-21 DIAGNOSIS — E785 Hyperlipidemia, unspecified: Secondary | ICD-10-CM | POA: Diagnosis not present

## 2015-11-21 DIAGNOSIS — F0390 Unspecified dementia without behavioral disturbance: Secondary | ICD-10-CM | POA: Diagnosis not present

## 2015-11-21 DIAGNOSIS — I509 Heart failure, unspecified: Secondary | ICD-10-CM | POA: Diagnosis not present

## 2015-11-21 DIAGNOSIS — E119 Type 2 diabetes mellitus without complications: Secondary | ICD-10-CM | POA: Diagnosis not present

## 2015-11-21 DIAGNOSIS — R260 Ataxic gait: Secondary | ICD-10-CM | POA: Diagnosis not present

## 2015-11-22 DIAGNOSIS — R260 Ataxic gait: Secondary | ICD-10-CM | POA: Diagnosis not present

## 2015-11-22 DIAGNOSIS — E785 Hyperlipidemia, unspecified: Secondary | ICD-10-CM | POA: Diagnosis not present

## 2015-11-22 DIAGNOSIS — Z8673 Personal history of transient ischemic attack (TIA), and cerebral infarction without residual deficits: Secondary | ICD-10-CM | POA: Diagnosis not present

## 2015-11-22 DIAGNOSIS — I10 Essential (primary) hypertension: Secondary | ICD-10-CM | POA: Diagnosis not present

## 2015-11-22 DIAGNOSIS — E119 Type 2 diabetes mellitus without complications: Secondary | ICD-10-CM | POA: Diagnosis not present

## 2015-11-22 DIAGNOSIS — F0390 Unspecified dementia without behavioral disturbance: Secondary | ICD-10-CM | POA: Diagnosis not present

## 2015-11-22 DIAGNOSIS — I509 Heart failure, unspecified: Secondary | ICD-10-CM | POA: Diagnosis not present

## 2015-11-23 DIAGNOSIS — I509 Heart failure, unspecified: Secondary | ICD-10-CM | POA: Diagnosis not present

## 2015-11-23 DIAGNOSIS — F0390 Unspecified dementia without behavioral disturbance: Secondary | ICD-10-CM | POA: Diagnosis not present

## 2015-11-23 DIAGNOSIS — E119 Type 2 diabetes mellitus without complications: Secondary | ICD-10-CM | POA: Diagnosis not present

## 2015-11-23 DIAGNOSIS — E785 Hyperlipidemia, unspecified: Secondary | ICD-10-CM | POA: Diagnosis not present

## 2015-11-23 DIAGNOSIS — Z8673 Personal history of transient ischemic attack (TIA), and cerebral infarction without residual deficits: Secondary | ICD-10-CM | POA: Diagnosis not present

## 2015-11-23 DIAGNOSIS — R260 Ataxic gait: Secondary | ICD-10-CM | POA: Diagnosis not present

## 2015-11-23 DIAGNOSIS — I10 Essential (primary) hypertension: Secondary | ICD-10-CM | POA: Diagnosis not present

## 2015-11-25 DIAGNOSIS — Z8673 Personal history of transient ischemic attack (TIA), and cerebral infarction without residual deficits: Secondary | ICD-10-CM | POA: Diagnosis not present

## 2015-11-25 DIAGNOSIS — E785 Hyperlipidemia, unspecified: Secondary | ICD-10-CM | POA: Diagnosis not present

## 2015-11-25 DIAGNOSIS — E119 Type 2 diabetes mellitus without complications: Secondary | ICD-10-CM | POA: Diagnosis not present

## 2015-11-25 DIAGNOSIS — F0390 Unspecified dementia without behavioral disturbance: Secondary | ICD-10-CM | POA: Diagnosis not present

## 2015-11-25 DIAGNOSIS — R260 Ataxic gait: Secondary | ICD-10-CM | POA: Diagnosis not present

## 2015-11-25 DIAGNOSIS — I10 Essential (primary) hypertension: Secondary | ICD-10-CM | POA: Diagnosis not present

## 2015-11-25 DIAGNOSIS — I509 Heart failure, unspecified: Secondary | ICD-10-CM | POA: Diagnosis not present

## 2015-11-29 DIAGNOSIS — Z8673 Personal history of transient ischemic attack (TIA), and cerebral infarction without residual deficits: Secondary | ICD-10-CM | POA: Diagnosis not present

## 2015-11-29 DIAGNOSIS — I10 Essential (primary) hypertension: Secondary | ICD-10-CM | POA: Diagnosis not present

## 2015-11-29 DIAGNOSIS — F0390 Unspecified dementia without behavioral disturbance: Secondary | ICD-10-CM | POA: Diagnosis not present

## 2015-11-29 DIAGNOSIS — E119 Type 2 diabetes mellitus without complications: Secondary | ICD-10-CM | POA: Diagnosis not present

## 2015-11-29 DIAGNOSIS — E785 Hyperlipidemia, unspecified: Secondary | ICD-10-CM | POA: Diagnosis not present

## 2015-11-29 DIAGNOSIS — R260 Ataxic gait: Secondary | ICD-10-CM | POA: Diagnosis not present

## 2015-11-29 DIAGNOSIS — I509 Heart failure, unspecified: Secondary | ICD-10-CM | POA: Diagnosis not present

## 2015-11-30 DIAGNOSIS — R260 Ataxic gait: Secondary | ICD-10-CM | POA: Diagnosis not present

## 2015-11-30 DIAGNOSIS — I509 Heart failure, unspecified: Secondary | ICD-10-CM | POA: Diagnosis not present

## 2015-11-30 DIAGNOSIS — F0390 Unspecified dementia without behavioral disturbance: Secondary | ICD-10-CM | POA: Diagnosis not present

## 2015-11-30 DIAGNOSIS — E785 Hyperlipidemia, unspecified: Secondary | ICD-10-CM | POA: Diagnosis not present

## 2015-11-30 DIAGNOSIS — Z8673 Personal history of transient ischemic attack (TIA), and cerebral infarction without residual deficits: Secondary | ICD-10-CM | POA: Diagnosis not present

## 2015-11-30 DIAGNOSIS — I10 Essential (primary) hypertension: Secondary | ICD-10-CM | POA: Diagnosis not present

## 2015-11-30 DIAGNOSIS — E119 Type 2 diabetes mellitus without complications: Secondary | ICD-10-CM | POA: Diagnosis not present

## 2015-12-02 ENCOUNTER — Ambulatory Visit (INDEPENDENT_AMBULATORY_CARE_PROVIDER_SITE_OTHER): Payer: PPO | Admitting: Neurology

## 2015-12-02 ENCOUNTER — Encounter: Payer: Self-pay | Admitting: Neurology

## 2015-12-02 VITALS — BP 186/68 | HR 75 | Ht 67.0 in | Wt 194.8 lb

## 2015-12-02 DIAGNOSIS — R32 Unspecified urinary incontinence: Secondary | ICD-10-CM | POA: Diagnosis not present

## 2015-12-02 NOTE — Patient Instructions (Signed)
I had a long discussion with the patient and his wife regarding his mild vascular dementia which appears stable on the current medication regimen of Namenda and Aricept. He unfortunately his having a lot of problems with incontinence and advised him to continue follow-up with urologist and at the [facility] clinic. Clearly it's becoming harder and harder for the patient's wife to care for him at home as he needs 24-hour care and he needs to move into assisted living facility or skilled nursing facility through the [place] system. Continue Plavix for secondary stroke prevention with strict control of hypertension with blood pressure goal below 130/90, lipids with LDL cholesterol goal below 70 mg percent. No routine follow-up appointment is necessary in this office patient may call if needed to be seen in the future arises.

## 2015-12-02 NOTE — Progress Notes (Signed)
[facility]  PATIENT: [name] DOB: 1941/01/18   REASON FOR VISIT: Follow-up for history of stroke and memory loss Complaint of depression HISTORY FROM: Wife and patient    HISTORY OF PRESENT ILLNESS:75 year-old [place] male with right hemispheric TIA in September 2011 and vascular risk factors of uncontrolled HT for many years, hyperlipidimia, diabetes and sleep apnea. History of right MCA aneurysm clipping in 2009.Mild cognitive impairment versus early dementia.  He returns for f/u after last visit on 09/23/2012. He was admitted to [facility] on [facility]/10/13 with generalized weakness and deconditioning. MRI brain showed no acute abnormalities. He went for rehab to SNF and is improving with PT/OT and is now walking with a walker. He has noticed worsening of his memory and cognitive difficulties. He forgets recent information and needs more help with ADLs like shaving and combing hair. He has been on aricept for a while and is tolerating it well.   UPDATE: 12/01/14 [name], 75 year old male returns for follow-up. He was last seen in this office by Charlott Holler nurse practitioner on 05/24/2014. He has not had further stroke or TIA symptoms. He is currently on Plavix daily His memory is about the same. He is currently on Aricept 10 mg daily and Namenda 28XR daily. He has visual and auditory hallucinations at night. He does not wander. He has had one fall since last seen.He had labwork for reversible causes of cognitive impairment and EEG in 2014 which were unremarkable. He can feed himself and needs minimal assistance with dressing. He can be incontinent but often misses the toilet. She is trying to get benefit through the [place] for help in the home. His blood pressure is noted to be elevated today however he has not taken his hypertensive medications. He returns for reevaluation UPDATE 06/01/2015. [name], 75 year old male returns for follow-up. He has not had further stroke or TIA  symptoms he is currently on Plavix daily. There has been no change in memory according to the wife. He is currently on Aricept and Namenda. He occasionally has auditory hallucinations at night however he does not wander. No recent falls. Appetite is good, he can feed himself and needs minimal assistance with dressing. His blood pressure is noted to be elevated in the office today however he has not taking his antihypertensives this morning. Wife  complains of his  lack of motivation and depression. He returns for reevaluation Update 12/02/15 : He returns for follow-up after last visit 6 months ago. Is accompanied by his wife. She feels that his bladder incontinence has gotten a lot worse. He can barely make it to the restroom and often soils himself. He does wear a diaper but that doesn't help. She is seeing a urologist in town as well as being seen in the [place] clinic. The patient's wife is falling finding it harder and harder to take care of him. He is a couple of falls and she was unable to get him up and had to call 911. Patient remains on Aricept and Namenda which is tolerating well. His blood pressure has also been recently high and she has been monitoring it at home. Today it is 186/68. She has an appointment at the [facility] to discuss medication changes. He has not had any recurrent stroke or TIA symptoms and remains on Plavix which is tolerating well without side effects. She feels he is cognitively the same but requires constant supervision and 24-hour care at home which she is finding it harder and  harder to care for him. She is interested in him moving to assisted living or nursing home through the [place] system REVIEW OF SYSTEMS: Full 14 system review of systems performed and notable only for those listed, all others are neg:   Chills, fatigue, hearing loss, light sensitivity, blurred vision, leg swelling, cold intolerance, excessive thirst, constipation, restless leg, insomnia, apnea, daytime sleepiness, snoring,  walking difficulty, joint swelling, bladder urgency, frequency of urination, incontinence, memory loss, confusion, depression, agitation  ALLERGIES: Allergies  Allergen Reactions  . Pioglitazone Other (See Comments)    Edema   . Rosiglitazone Maleate Other (See Comments)    edema    HOME MEDICATIONS: Outpatient Prescriptions Prior to Visit  Medication Sig Dispense Refill  . Aluminum & Magnesium Hydroxide (LIQUID ANTACID PO) Take 30 mLs by mouth every 4 (four) hours as needed (for indegestion).     . carvedilol (COREG) 12.5 MG tablet Take 1 tablet (12.5 mg total) by mouth 2 (two) times daily. 60 tablet 11  . chlorthalidone (HYGROTON) 25 MG tablet Take 1 tablet (25 mg total) by mouth daily. 30 tablet 3  . clopidogrel (PLAVIX) 75 MG tablet Take 1 tablet (75 mg total) by mouth daily. 30 tablet 11  . donepezil (ARICEPT) 10 MG tablet Take 1 tablet (10 mg total) by mouth at bedtime. 30 tablet 6  . Elastic Bandages & Supports (MEDICAL COMPRESSION STOCKINGS) [place] Apply daily 4 each 0  . Hypromellose (NATURAL BALANCE TEARS) 0.4 % SOLN Apply 2 drops to eye 3 (three) times daily.    . ILEVRO 0.3 % ophthalmic suspension Place 1 drop into the left eye daily.     Marland Kitchen lisinopril (PRINIVIL,ZESTRIL) 40 MG tablet Take 1 tablet (40 mg total) by mouth daily. 30 tablet 11  . memantine (NAMENDA XR) 28 MG CP24 24 hr capsule Take 1 capsule (28 mg total) by mouth daily. 30 capsule 6  . MYRBETRIQ 50 MG TB24 tablet Take 50 mg by mouth daily.     . potassium chloride SA (K-DUR,KLOR-CON) 20 MEQ tablet Take 1 tablet (20 mEq total) by mouth daily. 7 tablet 0  . sertraline (ZOLOFT) 25 MG tablet Take 1 tablet (25 mg total) by mouth daily. 30 tablet 6  . simvastatin (ZOCOR) 20 MG tablet Take 1 tablet (20 mg total) by mouth at bedtime. 30 tablet 11  . TOVIAZ 4 MG TB24 tablet Take 4 mg by mouth daily.      No facility-administered medications prior to visit.    PAST MEDICAL HISTORY: Past Medical History  Diagnosis Date   . Hypertension   . 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 TIA (transient ischemic attack)     multiple in past, most recently in 2011  . CHF (congestive heart failure) ([place])   . Depression   . Hyperlipemia   . Stroke [facility]) 2009    left thalamic infarct, residual LUE weakness  . Type II diabetes mellitus ([place])   . Sleep apnea     "my machine [place]; got to get me another one" (09/01/2014)  . Arthritis     "maybe"    PAST SURGICAL HISTORY: Past Surgical History  Procedure Laterality Date  . Knee arthroscopy Left   . Shoulder arthroscopy w/ rotator cuff repair Right   . 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 Cardiac catheterization  07/2010  . Craniotomy  05/2008  for clipping of an incidental asymptomatic unruptured/notes 02/13/2011    FAMILY HISTORY: Family History  Problem Relation Age of Onset  . Colon cancer Neg Hx   . Diabetes Sister   . Diabetes Brother   . Diabetes Paternal Aunt   . Stroke Mother   . Pneumonia Father     SOCIAL HISTORY: Social History   Social History  . Marital Status: Married    Spouse Name: [name]  . Number of Children: 3  . Years of Education: HS   Occupational History  . disabled    Social History Main Topics  . Smoking status: Former Smoker -- 1.50 packs/day for 15 years    Types: Cigarettes    Quit date: 10/16/1983  . Smokeless tobacco: Never Used  . Alcohol Use: No  . Drug Use: No  . Sexual Activity: No   Other Topics Concern  . Not on file   Social History Narrative   Pt lives at home with spouse.   Caffeine Use: 1 cup daily     PHYSICAL EXAM  Filed Vitals:   12/02/15 1014  BP: 186/68  Pulse: 75  Height: 5\' 7"  (1.702 m)  Weight: 194 lb 12.8 oz (88.361 kg)   Body mass index is 30.5 kg/(m^2). Generalized: In no acute distress, seated elderly AA male  Neck: Supple, no carotid bruits   Cardiac: Regular rate rhythm, no murmur  Pulmonary: Clear to auscultation bilaterally  Musculoskeletal: right second finger partial amputation, left Frozen shoulder  Skin: no rash/petichiae. 1 + edema feet bilaterally.  Vascular: Normal pulses all extremities   Neurological examination  Mentation: Alert oriented to time, place, language fluent, SLOW TO RESPOND, FLAT AFFECT, MMSE 23/30 ( last visit24/30), with deficits in orientation, attention and calculation. Animal Naming test scored 11 Clock drawing 1/4.GDS=6.  Cranial nerve II-XII: Pupils were equal round reactive to light extraocular movements were full, visual field were full on confrontational test. facial sensation and strength were normal. hearing was intact to finger rubbing bilaterally. Uvula tongue midline. head turning and shoulder shrug and were normal and symmetric.  MOTOR: normal bulk and tone, Normal strength in all tested extremity muscles.Limited shoulder abduction left more than right due to frozen shoulder. Mild diminished fine finger movements on left and orbits right over left upper extremity.  Sensory: intact to light touch on all 4 extremities. Coordination: Rapid alternating movements normal in all extremities. Finger-to-nose and heel-to-shin performed accurately bilaterally.  Gait and Station: Arises from chair without difficulty. Stance is broad based. Gait demonstrates broad-based and uses a walker. Not able to heel, toe and tandem walk without difficulty.   Reflexes: 1+ and symmetric.   DIAGNOSTIC DATA (LABS, IMAGING, TESTING) - I reviewed patient records, labs, notes, testing and imaging myself where available.  Lab Results  Component Value Date   WBC 6.5 11/10/2015   HGB 12.4* 11/10/2015   HCT 39.9 11/10/2015   MCV 84.0 11/10/2015   PLT 152 11/10/2015      Component Value Date/Time   NA 142 11/10/2015 1333   NA 144 08/22/2015 1117   K 2.8* 11/10/2015 1333   CL 105 11/10/2015 1333   CO2 29  11/10/2015 1333   GLUCOSE 165* 11/10/2015 1333   GLUCOSE 147* 08/22/2015 1117   BUN 29* 11/10/2015 1333   BUN 23 08/22/2015 1117   CREATININE 1.11 11/10/2015 1333   CREATININE 1.15 01/06/2015 1024   CALCIUM 8.5* 11/10/2015 1333   PROT 6.8 09/01/2014 1438   PROT 6.7 02/17/2014 1524  ALBUMIN 3.5 09/01/2014 1438   ALBUMIN 4.1 02/17/2014 1524   AST 20 09/01/2014 1438   ALT 26 09/01/2014 1438   ALKPHOS 62 09/01/2014 1438   BILITOT 1.0 09/01/2014 1438   GFRNONAA >60 11/10/2015 1333   GFRNONAA 64 01/06/2015 1024   GFRAA >60 11/10/2015 1333   GFRAA 74 01/06/2015 1024   Lab Results  Component Value Date   CHOL 102 06/08/2014   HDL 47 06/08/2014   LDLCALC 46 06/08/2014   TRIG 47 06/08/2014   CHOLHDL 2.2 06/08/2014   Lab Results  Component Value Date   HGBA1C 6.0 08/03/2015     ASSESSMENT AND PLAN  75 y.o. year old male  has a past medical history of Hypertension; Cerebral aneurysm without rupture (05/2008); TIA (transient ischemic attack); CHF (congestive heart failure); Depression; Hyperlipemia; Stroke (2009); Type II diabetes mellitus; Sleep apnea; and Arthritis.     Mild vascular dementia which is stable  I had a long discussion with the patient and his wife regarding his mild vascular dementia which appears stable on the current medication regimen of Namenda and Aricept. He unfortunately his having a lot of problems with incontinence and advised him to continue follow-up with urologist and at the [facility] clinic. Clearly it's becoming harder and harder for the patient's wife to care for him at home as he needs 24-hour care and he needs to move into assisted living facility or skilled nursing facility through the [place] system. Continue Plavix for secondary stroke prevention with strict control of hypertension with blood pressure goal below 130/90, lipids with LDL cholesterol goal below 70 mg percent. No routine follow-up appointment is necessary in this office patient may call if needed to be  seen in the future arises. Antony Contras, MD [facility] [address] [place], [place] [address] [phone]

## 2015-12-05 ENCOUNTER — Encounter (INDEPENDENT_AMBULATORY_CARE_PROVIDER_SITE_OTHER): Payer: PPO | Admitting: Ophthalmology

## 2015-12-05 DIAGNOSIS — E113291 Type 2 diabetes mellitus with mild nonproliferative diabetic retinopathy without macular edema, right eye: Secondary | ICD-10-CM

## 2015-12-05 DIAGNOSIS — E113212 Type 2 diabetes mellitus with mild nonproliferative diabetic retinopathy with macular edema, left eye: Secondary | ICD-10-CM

## 2015-12-05 DIAGNOSIS — H35033 Hypertensive retinopathy, bilateral: Secondary | ICD-10-CM | POA: Diagnosis not present

## 2015-12-05 DIAGNOSIS — E11311 Type 2 diabetes mellitus with unspecified diabetic retinopathy with macular edema: Secondary | ICD-10-CM | POA: Diagnosis not present

## 2015-12-05 DIAGNOSIS — H43813 Vitreous degeneration, bilateral: Secondary | ICD-10-CM | POA: Diagnosis not present

## 2015-12-05 DIAGNOSIS — I10 Essential (primary) hypertension: Secondary | ICD-10-CM

## 2015-12-05 DIAGNOSIS — H59032 Cystoid macular edema following cataract surgery, left eye: Secondary | ICD-10-CM | POA: Diagnosis not present

## 2015-12-09 DIAGNOSIS — E119 Type 2 diabetes mellitus without complications: Secondary | ICD-10-CM | POA: Diagnosis not present

## 2015-12-22 DIAGNOSIS — F0390 Unspecified dementia without behavioral disturbance: Secondary | ICD-10-CM | POA: Diagnosis not present

## 2015-12-22 DIAGNOSIS — E785 Hyperlipidemia, unspecified: Secondary | ICD-10-CM | POA: Diagnosis not present

## 2015-12-22 DIAGNOSIS — R260 Ataxic gait: Secondary | ICD-10-CM | POA: Diagnosis not present

## 2015-12-22 DIAGNOSIS — E119 Type 2 diabetes mellitus without complications: Secondary | ICD-10-CM | POA: Diagnosis not present

## 2015-12-22 DIAGNOSIS — I509 Heart failure, unspecified: Secondary | ICD-10-CM | POA: Diagnosis not present

## 2015-12-22 DIAGNOSIS — I10 Essential (primary) hypertension: Secondary | ICD-10-CM | POA: Diagnosis not present

## 2015-12-22 DIAGNOSIS — Z8673 Personal history of transient ischemic attack (TIA), and cerebral infarction without residual deficits: Secondary | ICD-10-CM | POA: Diagnosis not present

## 2015-12-29 DIAGNOSIS — F0390 Unspecified dementia without behavioral disturbance: Secondary | ICD-10-CM | POA: Diagnosis not present

## 2015-12-29 DIAGNOSIS — E119 Type 2 diabetes mellitus without complications: Secondary | ICD-10-CM | POA: Diagnosis not present

## 2015-12-29 DIAGNOSIS — I10 Essential (primary) hypertension: Secondary | ICD-10-CM | POA: Diagnosis not present

## 2015-12-29 DIAGNOSIS — R260 Ataxic gait: Secondary | ICD-10-CM | POA: Diagnosis not present

## 2015-12-29 DIAGNOSIS — E785 Hyperlipidemia, unspecified: Secondary | ICD-10-CM | POA: Diagnosis not present

## 2015-12-29 DIAGNOSIS — Z8673 Personal history of transient ischemic attack (TIA), and cerebral infarction without residual deficits: Secondary | ICD-10-CM | POA: Diagnosis not present

## 2015-12-29 DIAGNOSIS — I509 Heart failure, unspecified: Secondary | ICD-10-CM | POA: Diagnosis not present

## 2016-01-09 DIAGNOSIS — E1151 Type 2 diabetes mellitus with diabetic peripheral angiopathy without gangrene: Secondary | ICD-10-CM | POA: Diagnosis not present

## 2016-01-09 DIAGNOSIS — I739 Peripheral vascular disease, unspecified: Secondary | ICD-10-CM | POA: Diagnosis not present

## 2016-01-09 DIAGNOSIS — L84 Corns and callosities: Secondary | ICD-10-CM | POA: Diagnosis not present

## 2016-01-09 DIAGNOSIS — L603 Nail dystrophy: Secondary | ICD-10-CM | POA: Diagnosis not present

## 2016-03-02 ENCOUNTER — Encounter (INDEPENDENT_AMBULATORY_CARE_PROVIDER_SITE_OTHER): Payer: No Typology Code available for payment source | Admitting: Ophthalmology

## 2016-03-02 DIAGNOSIS — H353131 Nonexudative age-related macular degeneration, bilateral, early dry stage: Secondary | ICD-10-CM | POA: Diagnosis not present

## 2016-03-02 DIAGNOSIS — E113312 Type 2 diabetes mellitus with moderate nonproliferative diabetic retinopathy with macular edema, left eye: Secondary | ICD-10-CM | POA: Diagnosis not present

## 2016-03-02 DIAGNOSIS — H59032 Cystoid macular edema following cataract surgery, left eye: Secondary | ICD-10-CM

## 2016-03-02 DIAGNOSIS — H35372 Puckering of macula, left eye: Secondary | ICD-10-CM | POA: Diagnosis not present

## 2016-03-02 DIAGNOSIS — E113291 Type 2 diabetes mellitus with mild nonproliferative diabetic retinopathy without macular edema, right eye: Secondary | ICD-10-CM | POA: Diagnosis not present

## 2016-03-02 DIAGNOSIS — I10 Essential (primary) hypertension: Secondary | ICD-10-CM

## 2016-03-02 DIAGNOSIS — E11311 Type 2 diabetes mellitus with unspecified diabetic retinopathy with macular edema: Secondary | ICD-10-CM

## 2016-03-02 DIAGNOSIS — H35033 Hypertensive retinopathy, bilateral: Secondary | ICD-10-CM | POA: Diagnosis not present

## 2016-03-02 DIAGNOSIS — H43813 Vitreous degeneration, bilateral: Secondary | ICD-10-CM | POA: Diagnosis not present

## 2016-03-20 ENCOUNTER — Encounter: Payer: Self-pay | Admitting: Internal Medicine

## 2016-03-20 ENCOUNTER — Ambulatory Visit (INDEPENDENT_AMBULATORY_CARE_PROVIDER_SITE_OTHER): Payer: PPO | Admitting: Internal Medicine

## 2016-03-20 ENCOUNTER — Encounter: Payer: Self-pay | Admitting: Licensed Clinical Social Worker

## 2016-03-20 VITALS — BP 164/62 | HR 71 | Temp 98.2°F | Ht 67.0 in | Wt 203.8 lb

## 2016-03-20 DIAGNOSIS — R4189 Other symptoms and signs involving cognitive functions and awareness: Secondary | ICD-10-CM

## 2016-03-20 DIAGNOSIS — R296 Repeated falls: Secondary | ICD-10-CM

## 2016-03-20 DIAGNOSIS — Z9181 History of falling: Secondary | ICD-10-CM | POA: Diagnosis not present

## 2016-03-20 DIAGNOSIS — R4689 Other symptoms and signs involving appearance and behavior: Secondary | ICD-10-CM

## 2016-03-20 DIAGNOSIS — M25561 Pain in right knee: Secondary | ICD-10-CM

## 2016-03-20 DIAGNOSIS — F919 Conduct disorder, unspecified: Secondary | ICD-10-CM

## 2016-03-20 DIAGNOSIS — Z7189 Other specified counseling: Secondary | ICD-10-CM

## 2016-03-20 MED ORDER — DICLOFENAC SODIUM 1 % TD GEL: 4.0000 g | Freq: Four times a day (QID) | TRANSDERMAL | Status: DC

## 2016-03-20 NOTE — Patient Instructions (Signed)
-   It was a pleasure seeing you today - You are not due for any screenings as of yet this year - We discussed advance health directives and will keep you as a full code for now and continue this discussion after you have a chance to speak to your family - I will see you back in 1 month for follow up of your medical issues - Please continue with your discussion with social work about possible placement to assisted living - I will start you on voltaren gel for your knee pain

## 2016-03-20 NOTE — Progress Notes (Signed)
CSW met with Mr. Salemi and his spouse.  Spouse states pt is requiring additional care at home and has had more falls.  Spouses' main concern is she can no longer get Mr. Kubicek off the floor after a fall and has needed to call the emergency squad.  Mr. Ptacek has county supported services in-home 3 days a week that assist with ADL's.  Pt is able to feed himself but is dependent for all other ADL's.  Spouse is requesting assistance with long term care placement.  CSW initiated FL-2, provided family with a signed copy.  Original placed in chart to be scanned.  Spouse provided with information on local nursing homes, comparison chart from [email or web address], and information of [place].  Spouse encouraged to visit facilities and information on choosing LTC provided.  Spouse to meet with DSS and apply for Special Assistance.  Spouse aware to contact this worker once an accepting facility is obtained.

## 2016-03-20 NOTE — Progress Notes (Signed)
Subjective:   [name] is a 75 y.o. male who presents for a Medicare Annual Wellness Visit.  The following items have been reviewed and updated today in the appropriate area in the EMR.   Health Risk Assessment  Height, weight, BMI, and BP Visual acuity if needed Depression screen Fall risk / safety level Advance directive discussion Medical and family history were reviewed and updated Updating list of other providers & suppliers Medication reconciliation, including over the counter medicines Cognitive screen Written screening schedule Risk Factor list Personalized health advice, risky behaviors, and treatment advice       Objective:    Vitals: BP 164/62 mmHg  Pulse 71  Temp(Src) 98.2 F (36.8 C) (Oral)  Ht 5\' 7"  (1.702 m)  Wt 203 lb 12.8 oz (92.443 kg)  BMI 31.91 kg/m2  SpO2 98%  Activities of Daily Living In your present state of health, do you have any difficulty performing the following activities: 03/20/2016 09/16/2015  Hearing? N N  Vision? Y N  Difficulty concentrating or making decisions? Y N  Walking or climbing stairs? Y Y  Dressing or bathing? Y Y  Doing errands, shopping? Y Y    Goals Goals    . Blood Pressure < 140/90    . HEMOGLOBIN A1C < 7.0    . Prevent Falls       Fall Risk Fall Risk  12/02/2015 09/16/2015 08/22/2015 08/03/2015 04/07/2015  Falls in the past year? Yes Yes Yes Yes Yes  Number falls in past yr: 2 or more 2 or more 2 or more 2 or more 2 or more  Injury with Fall? No No Yes Yes No  Risk Factor Category  High Fall Risk High Fall Risk High Fall Risk High Fall Risk High Fall Risk  Risk for fall due to : History of fall(s) History of fall(s);Impaired balance/gait History of fall(s);Impaired balance/gait History of fall(s);Impaired balance/gait Impaired balance/gait  Follow up Falls evaluation completed;Education provided Falls prevention discussed Falls prevention discussed Falls prevention discussed Falls prevention discussed     Depression Screen PHQ 2/9 Scores 03/20/2016 09/16/2015 08/22/2015 08/22/2015  PHQ - 2 Score 0 6 6 0  PHQ- 9 Score - 12 12 -     Cognitive Testing I assessed the patient for cognitive issues and the patient did have issues with his cognition.  Assessment and Plan:     - Advanced care discussions were held with patient and his wife. I explained what resuscitation was and the methods used. I also explained prognosis after codes and the risks/benefits of it. He would like to remain full code for now but will discuss with his wife and daughters in further detail and we will revisit this on his next visit.  - Patient remains a significant fall risk - fell twice last week and 4 times last month. He does use a walker at home but feels like he gets pain in his knees and they give out. He is complaining of pain in his right knee currently. Has had home PT without much improvement. Will prescribe voltaren gel for pain.   - Wife feels she is unable to take care of patient at home adequately and would like SW to talk to her about assisted living options. I discussed with Edwena Blow, SW who spoke with patient and his wife and gave them options. Wife currently does all the work around the house including transporting patient, cooking and grocery shopping. Patient is unable to help around the house and can  no longer drive. Unable to do TUG test  - Patient's memory is worsening per wife. He scores 3 on his mini cog evaluation. He has a history of poor cognition and is on medication. He is not active and does not leave the house. He sleeps during the day and watches TV at night. His overall status appears to be declining. Discussed with patient and wife about options and they would like to pursue assisted living placement.    During the course of the visit the patient was educated and counseled about appropriate screening and preventive services as documented in the assessment and plan.  The printed AVS was given to  the patient and included an updated screening schedule, a list of risk factors, and personalized health advice.           Aldine Contes, MD  03/20/2016

## 2016-04-02 DIAGNOSIS — L603 Nail dystrophy: Secondary | ICD-10-CM | POA: Diagnosis not present

## 2016-04-02 DIAGNOSIS — I739 Peripheral vascular disease, unspecified: Secondary | ICD-10-CM | POA: Diagnosis not present

## 2016-04-02 DIAGNOSIS — E1151 Type 2 diabetes mellitus with diabetic peripheral angiopathy without gangrene: Secondary | ICD-10-CM | POA: Diagnosis not present

## 2016-04-18 ENCOUNTER — Ambulatory Visit (INDEPENDENT_AMBULATORY_CARE_PROVIDER_SITE_OTHER): Payer: PPO | Admitting: Internal Medicine

## 2016-04-18 ENCOUNTER — Encounter: Payer: Self-pay | Admitting: Licensed Clinical Social Worker

## 2016-04-18 VITALS — BP 149/56 | HR 56 | Temp 98.2°F | Wt 201.5 lb

## 2016-04-18 DIAGNOSIS — Z9181 History of falling: Secondary | ICD-10-CM | POA: Diagnosis not present

## 2016-04-18 DIAGNOSIS — I10 Essential (primary) hypertension: Secondary | ICD-10-CM | POA: Diagnosis not present

## 2016-04-18 DIAGNOSIS — R600 Localized edema: Secondary | ICD-10-CM

## 2016-04-18 DIAGNOSIS — R296 Repeated falls: Secondary | ICD-10-CM | POA: Diagnosis not present

## 2016-04-18 DIAGNOSIS — Z993 Dependence on wheelchair: Secondary | ICD-10-CM

## 2016-04-18 DIAGNOSIS — Z7189 Other specified counseling: Secondary | ICD-10-CM

## 2016-04-18 DIAGNOSIS — F0390 Unspecified dementia without behavioral disturbance: Secondary | ICD-10-CM

## 2016-04-18 DIAGNOSIS — Z0000 Encounter for general adult medical examination without abnormal findings: Secondary | ICD-10-CM

## 2016-04-18 MED ORDER — FUROSEMIDE 40 MG PO TABS: 40.0000 mg | ORAL_TABLET | Freq: Every day | ORAL | Status: DC

## 2016-04-18 NOTE — Progress Notes (Signed)
[name] is known to this worker, as spouse has been exploring placement and community resources for some time.  Mrs. [name] presents to [facility] today with request for PASRR and forms she received on 04/16/16 at [facility].  Spouse and daughter went to [place] to apply for Medicaid and placement but due to the amount of customers, they were provided with forms and instructed to apply online.  Mrs. [name] has yet to explore LTC facilities on her own and her only preference would be [facility], as pt has been there in the past.  CSW met with pt and spouse. [name] pleasant and agreeable for CSW to provide spouse with all information.  CSW provided Mrs. [name] with listing of pt's medicare replacement SNF facilities as a start for placement.  PASRR completed, Must ID [id number].  Awaiting determination.  FL-2 has been completed at earlier appointment.  Mrs. [name] forgot to inquire about the TB test, but aware pt will need to have a f/u appt in one week.  CSW encouraged spouse to decide when she/pt wanted to return and this worker will inquire about TB order.  CSW discussed referral to [facility] to community assistance with placement, spouse agreeable.

## 2016-04-18 NOTE — Assessment & Plan Note (Addendum)
A:  Bilateral lower extremity persists.  Previously, dopplers of the lower extremities were negative for DVT.  Echo done back in 2010 had an EF of 65%.  He has no other signs or symptoms to suggest CHF other than his isolated edema.  He has no orthopnea, SOB, cough, other edema.  Additionally, he and his wife tell me that when he sleeps, he hangs his feet off the bed and onto the floor.  His wife states he has been using compression stockings but not consistently.  He was also recently seen at the [place] who started him on Lasix 10mg  daily for his edema.  I believe the blistering seen on his left leg is secondary to his tense edema.  It is not draining, non-tender, not erythematous and does not slough off.  P: - encouraged patient to elevate legs as much as possible and continue compression stockings - STOP chlorthalidone - increase Lasix to 40mg  daily - BMET today - encouraged daily weights if possible - RTC 1 week

## 2016-04-18 NOTE — Progress Notes (Signed)
Internal Medicine Clinic Attending  Case discussed with Dr. Juleen China at the time of the visit.  We reviewed the resident's history and exam and pertinent patient test results.  I agree with the assessment, diagnosis, and plan of care documented in the resident's note.  Of note with this patient dementia is primary and supercedes mental illness

## 2016-04-18 NOTE — Patient Instructions (Signed)
Thank you for coming to see me today. It was a pleasure. Today we talked about:   Facility Placement: Our social worker, [name], met with you today to help with this process.  Leg Swelling:  STOP taking Chlorthalidone.  CONTINUE taking Lasix but I have increased the dose to 69m daily.  New prescription sent to your pharmacy as well.  We are checking some labs today and I will let you know if they are abnormal.  Please keep a record of his daily weight on this medication.  Continue using compression stockings and elevate feet when sleeping and sitting at rest.  Please follow-up with [facility] 1 week.  If you have any questions or concerns, please do not hesitate to call the office at [phone]  Take Care,   AJule Ser DO

## 2016-04-18 NOTE — Assessment & Plan Note (Signed)
A: Patient's wife present today and reinforces previous discussion that she had with her husband's PCP, Dr. [name].  She states she is unable to provide the level of care her husband now requires due to his frequent falls, declining status, and incontinence issues.  She reports he is mostly wheelchair bound, but will walk with the aide of a rolling walker.  He fell about 7 times last month, but did not hit his head as far as she knows.  She reports he is more forgetful and sleeps a lot during the day but is up at night.  Per discussion with his PCP at last visit in June, he will remain a full code but will continue to further discuss with his wife and daughters.  P: - met with our social worker, [name], today to help facilitate process of moving to assisted care facility - continue goals of care discussion

## 2016-04-18 NOTE — Progress Notes (Signed)
Patient ID: [name], male   DOB: 04/04/1941, 75 y.o.   MRN: [id number]   CC: lower extremity edema, discussion of long term care planning HPI: [name] is a 75 y.o. man with PMHx detailed below who is here with his wife to discuss planning for long-term care as well as his lower extremity edema.  Please see A&P for status of conditions addressed today.    Past Medical History  Diagnosis Date  . Hypertension   . 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 TIA (transient ischemic attack)     multiple in past, most recently in 2011  . CHF (congestive heart failure) ([place])   . Depression   . Hyperlipemia   . Stroke [name]) 2009    left thalamic infarct, residual LUE weakness  . Type II diabetes mellitus ([name])   . Sleep apnea     "my machine [place]; got to get me another one" (09/01/2014)  . Arthritis     "maybe"   Review of Systems: Review of Systems  Constitutional: Negative for fever and chills.  Respiratory: Negative for cough and shortness of breath.   Cardiovascular: Positive for leg swelling. Negative for chest pain and orthopnea.  Musculoskeletal: Positive for falls.  Skin:       Blister on left leg  Neurological: Negative for headaches.    Physical Exam: Filed Vitals:   04/18/16 1040  BP: 149/56  Pulse: 56  Temp: 98.2 F (36.8 C)  TempSrc: Oral  Weight: 201 lb 8 oz (91.4 kg)  SpO2: 98%   Physical Exam  Constitutional: He is oriented to person, place, and time.  Chronically ill-appearing man, sitting in wheelchair  HENT:  Head: Normocephalic and atraumatic.  Eyes: EOM are normal.  Pulmonary/Chest: Effort normal.  Neurological: He is alert and oriented to person, place, and time.  Skin:  2-3+ tense edema of bilateral lower extremities with an approximately 3cm blister on the left lateral lower extremity     Assessment & Plan:    See encounters tab for problem based medical decision making. Patient discussed with Dr. [name]  DEPENDENT EDEMA, LEGS, BILATERAL A:  Bilateral lower extremity persists.  Previously, dopplers of the lower extremities were negative for DVT.  Echo done back in 2010 had an EF of 65%.  He has no other signs or symptoms to suggest CHF other than his isolated edema.  He has no orthopnea, SOB, cough, other edema.  Additionally, he and his wife tell me that when he sleeps, he hangs his feet off the bed and onto the floor.  His wife states he has been using compression stockings but not consistently.  He was also recently seen at the [name] who started him on Lasix 67m daily for his edema.  I believe the blistering seen on his left leg is secondary to his tense edema.  It is not draining, non-tender, not erythematous and does not slough off.  P: - encouraged patient to elevate legs as much as possible and continue compression stockings - STOP chlorthalidone - increase Lasix to 44mdaily - BMET today - encouraged daily weights if possible - RTC 1 week  Advanced care planning/counseling discussion A: Patient's wife present today and reinforces previous discussion that she had with her husband's PCP, Dr. [name] She states she is unable to provide the level of care her husband now requires due  to his frequent falls, declining status, and incontinence issues.  She reports he is mostly wheelchair bound, but will walk with the aide of a rolling walker.  He fell about 7 times last month, but did not hit his head as far as she knows.  She reports he is more forgetful and sleeps a lot during the day but is up at night.  Per discussion with his PCP at last visit in June, he will remain a full code but will continue to further discuss with his wife and daughters.  P: - met with our social worker, [name], today to help facilitate process of moving to assisted care facility - continue goals of care  discussion

## 2016-04-19 LAB — BMP8+ANION GAP
Anion Gap: 15 mmol/L (ref 10.0–18.0)
BUN / CREAT RATIO: 23 (ref 10–24)
BUN: 30 mg/dL — ABNORMAL HIGH (ref 8–27)
CO2: 29 mmol/L (ref 18–29)
CREATININE: 1.31 mg/dL — AB (ref 0.76–1.27)
Calcium: 9.5 mg/dL (ref 8.6–10.2)
Chloride: 100 mmol/L (ref 96–106)
GFR calc Af Amer: 62 mL/min/{1.73_m2} (ref >59)
GFR, EST NON AFRICAN AMERICAN: 54 mL/min/{1.73_m2} — AB (ref >59)
Glucose: 247 mg/dL — ABNORMAL HIGH (ref 65–99)
Potassium: 4 mmol/L (ref 3.5–5.2)
SODIUM: 144 mmol/L (ref 134–144)

## 2016-04-19 NOTE — Progress Notes (Signed)
PASRR screen completed.  Able to use existing PASRR number [id number]

## 2016-04-20 ENCOUNTER — Encounter: Payer: Self-pay | Admitting: *Deleted

## 2016-04-20 ENCOUNTER — Other Ambulatory Visit: Payer: Self-pay | Admitting: *Deleted

## 2016-04-20 DIAGNOSIS — F0391 Unspecified dementia with behavioral disturbance: Secondary | ICD-10-CM

## 2016-04-20 DIAGNOSIS — F03918 Unspecified dementia, unspecified severity, with other behavioral disturbance: Secondary | ICD-10-CM

## 2016-04-20 DIAGNOSIS — E1169 Type 2 diabetes mellitus with other specified complication: Secondary | ICD-10-CM

## 2016-04-20 NOTE — Patient Outreach (Signed)
Referral received from care management assistant via PCP office requesting assistance with placement.  Member has history of diabetes as well as dementia and the wife is no longer able to care for him in the home.  Call placed to wife to address concerns.  [facility] care management services explained, this care manager introduced self.  She state that the [facility] health has continued to decline and she is seeking help for placement.  Made aware that social worker referral has been placed.  She state she has not finished looking over the list that was provided by the office social worker, Nelva Bush, but will have her daughter go over it with her.  She state that the member has been at [facility] and [facility] in the past, but again they will look over the list to make a decision on preferred facilities.  She denies any nursing concerns at this time, only requesting placement as soon as possible.  Will not open to nursing, however will be available to LCSW for any assistance needed.  Valente David, [place], MSN [place] [phone]

## 2016-04-25 ENCOUNTER — Encounter: Payer: Self-pay | Admitting: Internal Medicine

## 2016-04-25 ENCOUNTER — Ambulatory Visit (INDEPENDENT_AMBULATORY_CARE_PROVIDER_SITE_OTHER): Payer: PPO | Admitting: Internal Medicine

## 2016-04-25 VITALS — BP 158/48 | HR 59 | Temp 98.3°F | Ht 67.0 in | Wt 199.3 lb

## 2016-04-25 DIAGNOSIS — Z111 Encounter for screening for respiratory tuberculosis: Secondary | ICD-10-CM

## 2016-04-25 DIAGNOSIS — R600 Localized edema: Secondary | ICD-10-CM

## 2016-04-25 NOTE — Progress Notes (Signed)
Patient ID: [name], male   DOB: 03-19-1941, 75 y.o.   MRN: [id number]   CC: here for f/u of leg edema  HPI:  [name] is a 75 y.o. man with PMHx as below presenting for follow up of his lower extremity edema.  Please see A&P for status of chronic medical conditions.  Past Medical History  Diagnosis Date  . Hypertension   . 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 TIA (transient ischemic attack)     multiple in past, most recently in 2011  . CHF (congestive heart failure) ([place])   . Depression   . Hyperlipemia   . Stroke [name]) 2009    left thalamic infarct, residual LUE weakness  . Type II diabetes mellitus ([place])   . Sleep apnea     "my machine [place]; got to get me another one" (09/01/2014)  . Arthritis     "maybe"    Review of Systems:   Review of Systems  Constitutional: Negative for fever and chills.  Cardiovascular: Positive for leg swelling. Negative for orthopnea.  Skin:       Wound on left lower leg     Physical Exam:  Filed Vitals:   04/25/16 1053  BP: 158/48  Pulse: 59  Temp: 98.3 F (36.8 C)  TempSrc: Oral  Height: 5\' 7"  (1.702 m)  Weight: 199 lb 4.8 oz (90.402 kg)  SpO2: 98%   Physical Exam  Constitutional:  Pleasant, chronically ill-appearing, accompanied by wife, sitting in wheelchair.  HENT:  Head: Normocephalic and atraumatic.  Pulmonary/Chest: Effort normal.  Musculoskeletal:  2-3+ edema of bilateral lower extremities with an approximately 3cm wound on the left lateral lower extremity.  Non-infected appearing with pink granulation tissue, minimal clear serous drainage, no odor.     Assessment & Plan:   See encounters tab for problem based medical decision making.   Patient seen with Dr. [name] No problem-specific assessment & plan notes found for this encounter.

## 2016-04-25 NOTE — Assessment & Plan Note (Signed)
A: Patient has tolerated Lasix 40mg  daily but still has substantial peripheral edema isolated to his lower extremities.  His wife reports that he has not been doing compression stockings due to the blister on his leg that has since popped.  He is urinating more frequently with increased dose of Lasix. He still sleeps occasionally with his feet dangling off the side of the bed but they have been trying to elevate them more.  He denies orthopnea.  P: - will recheck a BMET to ensure renal function is okay - if able to tolerate, will increase Lasix to 40mg  BID - wound care supplies given in clinic today with instruction on how to apply by Yvonna Alanis, RN - emphasized compression stockings and elevating his legs - RTC 2 weeks

## 2016-04-25 NOTE — Patient Instructions (Signed)
Thank you for coming to see me today. It was a pleasure. Today we talked about:   Leg Swelling: I am checking some labs today.  If your kidney function looks good, we will increase the Lasix to 40mg  twice per day.  I will call you to let you know what to do.  Continue with the wound care instructions, elevate your legs, and wear compression stockings.  Please follow-up with me in 2 weeks.  If you have any questions or concerns, please do not hesitate to call the office at [phone].  Take Care,   Jule Ser, DO

## 2016-04-26 LAB — BMP8+ANION GAP
ANION GAP: 14 mmol/L (ref 10.0–18.0)
BUN/Creatinine Ratio: 24 (ref 10–24)
BUN: 32 mg/dL — AB (ref 8–27)
CALCIUM: 9.5 mg/dL (ref 8.6–10.2)
CHLORIDE: 104 mmol/L (ref 96–106)
CO2: 31 mmol/L — AB (ref 18–29)
Creatinine, Ser: 1.33 mg/dL — ABNORMAL HIGH (ref 0.76–1.27)
GFR calc Af Amer: 61 mL/min/{1.73_m2} (ref >59)
GFR calc non Af Amer: 53 mL/min/{1.73_m2} — ABNORMAL LOW (ref >59)
GLUCOSE: 202 mg/dL — AB (ref 65–99)
POTASSIUM: 3.9 mmol/L (ref 3.5–5.2)
Sodium: 149 mmol/L — ABNORMAL HIGH (ref 134–144)

## 2016-04-27 LAB — TB SKIN TEST
Induration: 0 mm
TB Skin Test: NEGATIVE

## 2016-04-27 NOTE — Progress Notes (Signed)
Internal Medicine Clinic Attending  I saw and evaluated the patient.  I personally confirmed the key portions of the history and exam documented by Dr. Juleen China and I reviewed pertinent patient test results.  The assessment, diagnosis, and plan were formulated together and I agree with the documentation in the resident's note.  Of note, patient also had PPD placed for screening for TB as part of the requirement for placement

## 2016-04-27 NOTE — Addendum Note (Signed)
Addended by: Marcelino Duster on: 04/27/2016 02:39 PM   Modules accepted: Orders

## 2016-04-30 ENCOUNTER — Encounter: Payer: Self-pay | Admitting: *Deleted

## 2016-04-30 ENCOUNTER — Other Ambulatory Visit: Payer: Self-pay | Admitting: *Deleted

## 2016-04-30 NOTE — Patient Outreach (Signed)
[facility]) Care Management  04/30/2016  [name] 03-03-1941 [id number]  CSW received a new referral on patient from patient's RNCM with [facility], Valente David indicating that patient would benefit from social work services and resources to assist with obtaining long-term care placement for patient.  Patient's wife, [name] has been providing 24 hour care and supervision to patient, who suffers from Dementia.  [name] admits that she is no longer able to provide patient's needed and recommended level of care in the home.  [name] has been working with [name], Licensed Clinical Social Worker with the [facility], regarding obtaining an FL-2 Form for placement purposes. CSW was able to make initial contact with patient today to perform phone assessment, as well as assess and assist with social work needs and services.  CSW introduced self, explained role and types of services provided through [facility] [facility] ([place] [facility]).  CSW further explained to patient that CSW works with patient's RNCM, also with [facility], Valente David. CSW then explained the reason for the call, indicating that patient's RNCM thought that patient would benefit from social work services and resources to assist with possible long-term care placement.  CSW obtained two HIPAA compliant identifiers from patient, which included patient's name and date of birth. CSW spoke with [name] at length about placement options for patient.  [name] reported that she is interested in [facility], [facility], as well as [facility].  CSW will converse with [name] to obtain the completed and signed FL-2 Form to ensure that it has been faxed to both skilled facilities.  CSW agreed to follow-up with [name] in one week to check the status of  bed offers.  A HIPAA compliant message has been left for [name].  CSW is currently awaiting a return call. [name], BSW, MSW, LCSW  Licensed Education officer, environmental [facility]  Mailing [place] [address], [place], [place] [address] Physical Address-[address], [place], [place] [address] Toll Free Main # [phone] Fax # [phone] Cell # [phone]  Fax # [phone]  [email or web address]

## 2016-04-30 NOTE — Progress Notes (Signed)
This encounter was created in error - please disregard.

## 2016-05-03 NOTE — Patient Outreach (Signed)
Call placed to member's wife to follow up on contact with social worker and to inquire about any further nursing needs.  No answer, unable to leave a voice message as mailbox was full.  Will follow up next week.  Valente David, [place], MSN [place] [phone]

## 2016-05-04 ENCOUNTER — Other Ambulatory Visit: Payer: Self-pay | Admitting: *Deleted

## 2016-05-07 ENCOUNTER — Other Ambulatory Visit: Payer: Self-pay | Admitting: *Deleted

## 2016-05-07 NOTE — Patient Outreach (Signed)
[facility]) Care Management  05/07/2016  [name] 1941-01-29 [id number]   CSW was able to make contact with patient's wife, Marquavis Mardis today to explain that patient's FL-2 Form has been faxed to the facilities of their choice ([facility] and [facility]) and to be expecting a call from the admissions coordinators from both facilities regarding bed offers.  CSW encouraged Mrs. Kavanagh to contact CSW to report findings of phone conversations with the admissions coordinators.  CSW will contact Mrs. Brod on Monday, July 31st, if CSW does not receive a return call from Mrs. Elem, in the meantime. [name], BSW, MSW, LCSW  Licensed Education officer, environmental Health System  Mailing [place] [address], [place], [place] [address] Physical Address-[address], [place], [place] [address] Toll Free Main # [phone] Fax # [phone] Cell # [phone]  Fax # [phone]  [email or web address]

## 2016-05-09 ENCOUNTER — Other Ambulatory Visit: Payer: Self-pay | Admitting: *Deleted

## 2016-05-09 ENCOUNTER — Ambulatory Visit (INDEPENDENT_AMBULATORY_CARE_PROVIDER_SITE_OTHER): Payer: PPO | Admitting: Internal Medicine

## 2016-05-09 VITALS — BP 135/53 | HR 56 | Temp 98.4°F | Ht 67.0 in | Wt 199.0 lb

## 2016-05-09 DIAGNOSIS — R600 Localized edema: Secondary | ICD-10-CM

## 2016-05-09 DIAGNOSIS — R32 Unspecified urinary incontinence: Secondary | ICD-10-CM | POA: Diagnosis not present

## 2016-05-09 DIAGNOSIS — R319 Hematuria, unspecified: Secondary | ICD-10-CM | POA: Diagnosis not present

## 2016-05-09 DIAGNOSIS — I10 Essential (primary) hypertension: Secondary | ICD-10-CM | POA: Diagnosis not present

## 2016-05-09 MED ORDER — FUROSEMIDE 40 MG PO TABS: 80.0000 mg | ORAL_TABLET | Freq: Every day | ORAL | 1 refills | Status: DC

## 2016-05-09 NOTE — Assessment & Plan Note (Signed)
Patient recently had a catheter placed. He reports 2-3 episodes of noticing blood in his catheter. This seems to occur after he pulls at the catheter. He denies fever, chills or night sweats. He has no signs or symptoms to suggest infection. We will continue to monitor this problem at the patient's next visit and if symptoms continue will make an earlier appointment to see urology. He has not noticed any blood for 3 days. -- Follow-up in 2 weeks -- If patient continues to see blood in his catheter will make an early appointment to see urology

## 2016-05-09 NOTE — Patient Outreach (Signed)
Call placed to wife to inquire about progress with placement.  Wife, [name], verifies identity, and confirms that she is still waiting for the member to be placed.  She state that providing care for him 24hours remain her biggest concern.  Made aware that, per note from LCSW J. Saporito, the member's FL2 has been sent to the two facilities of her choice, and that she should be receiving a call from the admissions coordinator for bed offers.  She state she was not aware of this although Mrs. Saporito's note state she was informed.    Wife state that the member has a follow up appointment with his PCP today, and she will provide update regarding placement with the physician and social worker at the office.  Wife made aware that per Mrs. Saporito's note, she will follow up with her by next week to discuss bed offers.  Wife state that her daughter has just arrived back in town today and will also continue to help her until the member is placed.  Wife again denies any nursing needs and is agreeable to wait for follow up calls from the facilities and Mrs. Saporito.  This care manger will inform LCSW that case will be closed to nursing, but remain available if needed.  [facility] remains involved with LCSW for placement.  Valente David, [place], MSN [place] [phone]

## 2016-05-09 NOTE — Patient Instructions (Signed)
It was a pleasure seeing you today. Thank you for choosing [facility] for your healthcare needs.  -- Take 2 lasix pills 1 in the morning and 1 at night for a total of 80mg  -- When you get the new prescription (80mg ) just take one pill in the morning  -- Return to clinic in 2 weeks for BMET

## 2016-05-09 NOTE — Progress Notes (Signed)
   CC: follow-up of leg edema HPI: [name] is a 75 y.o. male with a h/o of arthritis, congestive heart failure, depression, hyperlipidemia, hypertension and sleep apnea who presents as a follow-up for continued leg edema. The patient denies headaches, shortness of breath, chest pain, nausea, vomiting or abdominal pain. He has no additional acute complaints or concerns at today's visit.  Please see problem-based charting for status of medical issues pertinent to this visit.     Past Medical History:  Diagnosis Date  . Arthritis    "maybe"  . 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 CHF (congestive heart failure) ([place])   . Depression   . Hyperlipemia   . Hypertension   . Sleep apnea    "my machine [place]; got to get me another one" (09/01/2014)  . Stroke [facility]) 2009   left thalamic infarct, residual LUE weakness  . TIA (transient ischemic attack)    multiple in past, most recently in 2011  . Type II diabetes mellitus ([place])    Current Outpatient Rx  . Order #: [id number] Class: Historical Med  . Order #: [id number] Class: Normal  . Order #: [id number] Class: Normal  . Order #: [id number] Class: Normal  . Order #: [id number] Class: Normal  . Order #: [id number] Class: Print  . Order #: [id number] Class: Normal  . Order #: [id number] Class: Historical Med  . Order #: [id number] Class: Historical Med  . Order #: [id number] Class: Normal  . Order #: [id number] Class: Normal  . Order #: [id number] Class: Historical Med  . Order #: [id number] Class: Print  . Order #: [id number] Class: Normal  . Order #: [id number] Class: Normal  . Order #: [id number] Class: Historical Med     Review of Systems: A complete ROS was negative except as per HPI.  Physical Exam: Vitals:   05/09/16 1103  BP: (!) 135/53  Pulse: (!) 56  Temp: 98.4 F (36.9 C)  TempSrc: Oral  Weight: 199 lb (90.3 kg)    Height: 5\' 7"  (1.702 m)   General appearance: alert and cooperative Head: Normocephalic, without obvious abnormality, atraumatic Lungs: clear to auscultation bilaterally Heart: regular rate and rhythm, S1, S2 normal, no murmur, click, rub or gallop Abdomen: soft, non-tender; bowel sounds normal; no masses,  no organomegaly and Bowel sounds present, no abdominal bruits auscultated Extremities: 2+ pitting edema in the bilateral lower extremities extending to the patella, small red weeping lesion on the lateral aspect of the patient's left lower extremity which is improved from previous examinations  Assessment & Plan:  See encounters tab for problem based medical decision making. Patient seen with Dr. Lynnae January  Signed: Ophelia Shoulder, MD 05/09/2016, 11:08 AM  Pager: [phone]

## 2016-05-09 NOTE — Assessment & Plan Note (Signed)
Patient presents with continued bilateral lower extremity edema extending to the patella. He was recently started on 40 mg Lasix and brought in for a basic metabolic panel today. His renal function is stable and he says that his swelling has only slightly improved. He does not wear compression stockings and does not elevate his legs. Given that his renal function is stable and he continues to have bilateral lower extremity edema increasing his Lasix dose would be appropriate at this time. -- Increase Lasix from 40 mg once daily to 40 mg twice a day -- Basic metabolic panel in 2 weeks to reassess renal function -- Follow-up to clinic in 2 weeks to assess status of lower extremity edema

## 2016-05-10 NOTE — Progress Notes (Signed)
Internal Medicine Clinic Attending  I saw and evaluated the patient.  I personally confirmed the key portions of the history and exam documented by Dr. Taylor and I reviewed pertinent patient test results.  The assessment, diagnosis, and plan were formulated together and I agree with the documentation in the resident's note.  

## 2016-05-14 ENCOUNTER — Other Ambulatory Visit: Payer: Self-pay | Admitting: *Deleted

## 2016-05-14 NOTE — Patient Outreach (Signed)
[name]) Care Management  05/14/2016  [name] [birth year] [id number]  CSW was able to make contact with patient's wife, [name] today to follow-up regarding long-term care placement arrangements for patient into a skilled nursing facility.  [name] admits that she has not received a call from either facility that patient's FL-2 Form was faxed too, which were [facility] and [facility].  CSW agreed to contact the admissions coordinators at both facilities to ensure that patient's FL-2 Forms were received.  CSW will then follow-up with [name], once contact is made, to report findings. CSW made an attempt to try and contact [name] [phone]), Admissions Coordinator at [facility], [facility], a [place] where [name] and [name] are trying to pursue placement for patient.  However, [name] was unavailable. A HIPAA compliant message was left on voicemail.  CSW then tried calling [name] [phone]), Admissions Coordinator at [facility], [place] where CSW and wife are trying to pursue placement for patient, without success.  A HIPAA compliant message was left and CSW is currently awaiting a return call. CSW contacted [name] explaining that messages have been left for both individuals and that CSW will follow-up with [name] once contact is made.  [name] voiced understanding and was agreeable to this plan.  If CSW does not receive a return call before Thursday, August 3rd, CSW will outreach to [name], [name] and [name] again on Tuesday, August 15th, once CSW returns from vacation. [name], [name], [name], [name]  Licensed Education officer, [facility]  Mailing [place] [address], [place], [place] [address] Physical Address-[address], [place], [place] [address] Toll Free Main #  [phone] Fax # [phone] Cell # [phone]  Fax # [phone]  [email or web address]

## 2016-05-22 ENCOUNTER — Telehealth: Payer: Self-pay | Admitting: Licensed Clinical Social Worker

## 2016-05-22 NOTE — Telephone Encounter (Signed)
CSW received call from Ms. Cullom.  Spouse states Mr. Foody's care has exceeded her abilities and she can no longer provide care for spouse.  Mrs. Ginsberg has been looking in to placement for a couple of months and has been unsuccessful finding placement.  Referral was made to [facility] to assist with placement; however spouse unsure if she has met with [facility] in person or telephone.  Spouse states she contacted [facility] and was told they were unable to accept direct from community.  CSW provided Mrs. Blumenstock to the contact number to [facility] [phone], to request placement assistance due to pt's care needs exceeding her ability. Per spouse request, CSW faxed FL-2 with PASRR# and chart information to [employer] facilities that have dementia units and accept medicaid: [facility], [place] and [place].  Call placed to [place] regarding availability, [facility] Liaison confirming network status with [employer] and will contact this worker. Spouse notified of the above, awaiting response for either facilities.

## 2016-05-23 ENCOUNTER — Emergency Department (HOSPITAL_COMMUNITY)
Admission: EM | Admit: 2016-05-23 | Discharge: 2016-05-23 | Disposition: A | Discharge status: Home / Self Care | Payer: PPO | Attending: Emergency Medicine | Admitting: Emergency Medicine

## 2016-05-23 ENCOUNTER — Emergency Department (HOSPITAL_COMMUNITY): Payer: PPO

## 2016-05-23 ENCOUNTER — Encounter (HOSPITAL_COMMUNITY): Payer: Self-pay

## 2016-05-23 DIAGNOSIS — S0990XA Unspecified injury of head, initial encounter: Secondary | ICD-10-CM | POA: Diagnosis not present

## 2016-05-23 DIAGNOSIS — Y9289 Other specified places as the place of occurrence of the external cause: Secondary | ICD-10-CM | POA: Insufficient documentation

## 2016-05-23 DIAGNOSIS — M79606 Pain in leg, unspecified: Secondary | ICD-10-CM | POA: Diagnosis not present

## 2016-05-23 DIAGNOSIS — M25461 Effusion, right knee: Secondary | ICD-10-CM | POA: Diagnosis not present

## 2016-05-23 DIAGNOSIS — W010XXA Fall on same level from slipping, tripping and stumbling without subsequent striking against object, initial encounter: Secondary | ICD-10-CM | POA: Insufficient documentation

## 2016-05-23 DIAGNOSIS — Y999 Unspecified external cause status: Secondary | ICD-10-CM | POA: Diagnosis not present

## 2016-05-23 DIAGNOSIS — Z87891 Personal history of nicotine dependence: Secondary | ICD-10-CM | POA: Insufficient documentation

## 2016-05-23 DIAGNOSIS — S4992XA Unspecified injury of left shoulder and upper arm, initial encounter: Secondary | ICD-10-CM | POA: Diagnosis not present

## 2016-05-23 DIAGNOSIS — Z8673 Personal history of transient ischemic attack (TIA), and cerebral infarction without residual deficits: Secondary | ICD-10-CM | POA: Diagnosis not present

## 2016-05-23 DIAGNOSIS — I509 Heart failure, unspecified: Secondary | ICD-10-CM | POA: Diagnosis not present

## 2016-05-23 DIAGNOSIS — M25569 Pain in unspecified knee: Secondary | ICD-10-CM | POA: Diagnosis not present

## 2016-05-23 DIAGNOSIS — I110 Hypertensive heart disease with heart failure: Secondary | ICD-10-CM | POA: Diagnosis not present

## 2016-05-23 DIAGNOSIS — Z79899 Other long term (current) drug therapy: Secondary | ICD-10-CM | POA: Insufficient documentation

## 2016-05-23 DIAGNOSIS — Y939 Activity, unspecified: Secondary | ICD-10-CM | POA: Diagnosis not present

## 2016-05-23 DIAGNOSIS — E119 Type 2 diabetes mellitus without complications: Secondary | ICD-10-CM | POA: Diagnosis not present

## 2016-05-23 DIAGNOSIS — W19XXXA Unspecified fall, initial encounter: Secondary | ICD-10-CM

## 2016-05-23 DIAGNOSIS — M25561 Pain in right knee: Secondary | ICD-10-CM

## 2016-05-23 NOTE — ED Notes (Signed)
Pt able to ambulate in hallway w/ walker. Reports soreness in right knee, but still able to bear weight on right knee. Merry Proud, [place] aware.

## 2016-05-23 NOTE — ED Triage Notes (Signed)
Pt states he got tangled up in foley this morning when in bathroom and fell between toilet and sink. EMS reports he was wedged with right leg caught behind him. Pt complains of right knee and ankle pain. No shortening or rotation of right leg or tenderness to right hip with palpation. Pt right knee and ankle tender to palpation and with flexion

## 2016-05-23 NOTE — ED Provider Notes (Signed)
[facility] DEPT Provider Note   CSN: [id number] Arrival date & time: 05/23/16  [id number]  First Provider Contact:  None       History   Chief Complaint Chief Complaint  Patient presents with  . Fall    HPI [name] is a 75 y.o. male.  HPI   75 year old male presents status post fall. Patient reports he was getting up this morning to use the restroom when his feet slipped on the floor causing him to fall and become wedged between the toilet and the wall. Family notes that EMS had to be called as they were unable to get patient out. Patient reports that his right knee and hip were extended back. He notes that he struck the posterior aspect of his head, with very minor pain to the head. He notes pain to the right knee, has not tried to ambulate since the incident. Patient denies any headache, neck pain, chest pain, back pain, abdominal pain or hip pain. He denies pain with range of motion of the lower extremities, denies any loss of distal sensation. Patient reports he is unable to lift his right leg due to pain in the knee. Patient reports he is on anticoagulants.  Past Medical History:  Diagnosis Date  . Arthritis    "maybe"  . 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 CHF (congestive heart failure) ([place])   . Depression   . Hyperlipemia   . Hypertension   . Sleep apnea    "my machine [place]; got to get me another one" (09/01/2014)  . Stroke [facility]) 2009   left thalamic infarct, residual LUE weakness  . TIA (transient ischemic attack)    multiple in past, most recently in 2011  . Type II diabetes mellitus [facility])     Patient Active Problem List   Diagnosis Date Noted  . Advanced care planning/counseling discussion 04/18/2016  . Health care maintenance 01/06/2015  . Weakness generalized 09/01/2014  . Urinary incontinence 06/24/2013  . CVA (cerebral infarction)  03/26/2013  . Depression 03/26/2013  . Abnormality of gait 01/20/2013  . Adjustment disorder with mixed anxiety and depressed mood 12/24/2012  . Vascular dementia 10/24/2012  . Mild cognitive impairment 05/25/2012  . Frequent falls 05/05/2012  . 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 Cerebral artery occlusion with cerebral infarction ([place]) 08/18/2008  . HYPERLIPIDEMIA 02/12/2008  . DEPENDENT EDEMA, LEGS, BILATERAL 01/14/2007  . G E R D 10/28/2006  . HYPERALDOSTERONISM 10/16/2006  . Obstructive sleep apnea 10/16/2006  . Diabetes type 2, controlled ([place]) 09/23/2006  . Essential hypertension 09/23/2006    Past Surgical History:  Procedure Laterality Date  . CARDIAC CATHETERIZATION  07/2010  . CRANIOTOMY  05/2008   for clipping of an incidental asymptomatic unruptured/notes 02/13/2011  . KNEE ARTHROSCOPY Left   . 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 SHOULDER ARTHROSCOPY W/ ROTATOR CUFF REPAIR Right        Home Medications    Prior to Admission medications   Medication Sig Start Date End Date Taking? Authorizing Provider  Aluminum & Magnesium Hydroxide (LIQUID ANTACID PO) Take 30 mLs by mouth every 4 (four) hours as needed (for indegestion).    Yes Historical Provider, MD  carvedilol (COREG) 12.5 MG tablet Take 1 tablet (12.5 mg total) by mouth 2 (two) times daily. 01/06/15  Yes Aldine Contes, MD  clopidogrel (PLAVIX) 75 MG  tablet Take 1 tablet (75 mg total) by mouth daily. 06/01/15  Yes Dennie Bible, NP  diclofenac sodium (VOLTAREN) 1 % GEL Apply 4 g topically 4 (four) times daily. 03/20/16  Yes Nischal Narendra, MD  donepezil (ARICEPT) 10 MG tablet Take 1 tablet (10 mg total) by mouth at bedtime. 06/01/15  Yes Dennie Bible, NP  Elastic Bandages & Supports (MEDICAL COMPRESSION STOCKINGS) [place] Apply daily 09/16/15  Yes Loleta Chance, MD  furosemide (LASIX) 40 MG tablet Take 2 tablets (80 mg total) by mouth daily. 05/09/16  Yes Ophelia Shoulder, MD    Hypromellose (NATURAL BALANCE TEARS) 0.4 % SOLN Apply 2 drops to eye 3 (three) times daily.   Yes Historical Provider, MD  ILEVRO 0.3 % ophthalmic suspension Place 1 drop into the left eye daily.  04/13/15  Yes Historical Provider, MD  lisinopril (PRINIVIL,ZESTRIL) 40 MG tablet Take 1 tablet (40 mg total) by mouth daily. 01/06/15  Yes Nischal [name], MD  memantine (NAMENDA XR) 28 MG CP24 24 hr capsule Take 1 capsule (28 mg total) by mouth daily. 06/01/15  Yes Dennie Bible, NP  MYRBETRIQ 50 MG TB24 tablet Take 50 mg by mouth daily.  04/29/15  Yes Historical Provider, MD  potassium chloride SA (K-DUR,KLOR-CON) 20 MEQ tablet Take 1 tablet (20 mEq total) by mouth daily. 11/10/15  Yes Daleen Bo, MD  sertraline (ZOLOFT) 25 MG tablet Take 1 tablet (25 mg total) by mouth daily. 06/01/15  Yes Dennie Bible, NP  simvastatin (ZOCOR) 20 MG tablet Take 1 tablet (20 mg total) by mouth at bedtime. 01/06/15  Yes Nischal Narendra, MD  TOVIAZ 4 MG TB24 tablet Take 4 mg by mouth daily.  04/29/15  Yes Historical Provider, MD    Family History Family History  Problem Relation Age of Onset  . Stroke Mother   . Pneumonia Father   . Diabetes Sister   . Diabetes Brother   . Diabetes Paternal Aunt   . Colon cancer Neg Hx     Social History Social History  Substance Use Topics  . Smoking status: Former Smoker    Packs/day: 1.50    Years: 15.00    Types: Cigarettes    Quit date: 10/16/1983  . Smokeless tobacco: Never Used  . Alcohol use No     Allergies   Pioglitazone and Rosiglitazone maleate   Review of Systems Review of Systems  All other systems reviewed and are negative.    Physical Exam Updated Vital Signs BP 164/68 (BP Location: Right Arm)   Pulse 70   Temp 98.7 F (37.1 C) (Oral)   Resp 16   Ht 5\' 7"  (1.702 m)   Wt 90.3 kg   SpO2 95%   BMI 31.17 kg/m   Physical Exam  Constitutional: He is oriented to person, place, and time. He appears well-developed and  well-nourished.  HENT:  Head: Normocephalic and atraumatic.  Eyes: Conjunctivae are normal. Pupils are equal, round, and reactive to light. Right eye exhibits no discharge. Left eye exhibits no discharge. No scleral icterus.  Neck: Normal range of motion. No JVD present. No tracheal deviation present.  Pulmonary/Chest: Effort normal. No stridor.  Musculoskeletal:  Bilateral lower extremity edema ( baseline for patient) with skin tear to the left lateral aspect, no signs of cellulitis.   TTP of right knee diffusely, full active passive range of motion.  Minor tenderness to palpation of the left anterior shoulder ominous signs of trauma.  No CT or L-spine tenderness, full active  range of motion of the neck, chest nontender to palpation with normal lung expansion, hips are stable, nontender full active range of motion with a hip without pain. Upper extremities nontender to palpation  Neurological: He is alert and oriented to person, place, and time. Coordination normal.  Psychiatric: He has a normal mood and affect. His behavior is normal. Judgment and thought content normal.  Nursing note and vitals reviewed.    ED Treatments / Results  Labs (all labs ordered are listed, but only abnormal results are displayed) Labs Reviewed - No data to display  EKG  EKG Interpretation None       Radiology Dg Knee 2 Views Right  Result Date: 05/23/2016 CLINICAL DATA:  75 year old male post fall. Knee pain. Initial encounter. EXAM: RIGHT KNEE - 1-2 VIEW COMPARISON:  None. FINDINGS: Two-view examination right knee without evidence of fracture or dislocation. If fracture remained of high clinical concern, oblique views may be considered. Mild prominence at the patella region may represent soft tissue injury. 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 Small joint effusion. Vascular calcifications. On lateral view, there is either prominence of the popliteal artery with aneurysmal  dilatation versus overlapping radiopaque structures. IMPRESSION: Two-view examination right knee without evidence of fracture or dislocation. If fracture remained of high clinical concern, oblique views may be considered. Mild prominence infrapatellar region may represent soft tissue injury. 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 Small joint effusion. Vascular calcifications. On lateral view, there is either prominence of the popliteal artery with aneurysmal dilatation versus overlapping radiopaque structures. Electronically Signed   By: Genia Del M.D.   On: 05/23/2016 07:50   Ct Head Wo Contrast  Result Date: 05/23/2016 CLINICAL DATA:  The patient fell in the bathroom this morning with a blow to the head. Initial encounter. EXAM: CT HEAD WITHOUT CONTRAST TECHNIQUE: Contiguous axial images were obtained from the base of the skull through the vertex without intravenous contrast. COMPARISON:  Head CT scan 11/10/2015. FINDINGS: There is cortical atrophy and chronic microvascular ischemic change. Remote right basal ganglia lacunar infarct is identified. The patient is status post right craniotomy and right MCA aneurysm clipping as on the prior study. No evidence of acute abnormality including hemorrhage, infarct, mass lesion, mass effect, midline shift or abnormal extra-axial fluid collection. No hydrocephalus or pneumocephalus. No fracture. Imaged paranasal sinuses mastoid air cells are clear. IMPRESSION: No acute abnormality. Atrophy and chronic microvascular ischemic change. Status post right MCA aneurysm clipping. Electronically Signed   By: Inge Rise M.D.   On: 05/23/2016 07:29   Dg Shoulder Left  Result Date: 05/23/2016 CLINICAL DATA:  76 year old male. Fall. Soreness left shoulder. Initial encounter. EXAM: LEFT SHOULDER - 2+ VIEW COMPARISON:  11/10/2015 chest x-ray. FINDINGS: Degenerative changes glenohumeral joint and acromioclavicular joint without evidence of fracture or  dislocation. Bones appear osteopenic. Visualized lungs clear. IMPRESSION: Degenerative changes glenohumeral joint and acromioclavicular joint without evidence of fracture or dislocation. Electronically Signed   By: Genia Del M.D.   On: 05/23/2016 07:46    Procedures Procedures (including critical care time)  Medications Ordered in ED Medications - No data to display   Initial Impression / Assessment and Plan / ED Course  I have reviewed the triage vital signs and the nursing notes.  Pertinent labs & imaging results that were available during my care of the patient were reviewed by me and considered in my medical decision making (see chart for details).  Clinical Course     Final Clinical Impressions(s) /  ED Diagnoses   Final diagnoses:  Fall, initial encounter  Right knee pain   Labs:   Imaging: CT head without, shoulder left, DG knee 2 view  Consults:  Therapeutics:   Discharge Meds:   Assessment/Plan:  75 year old male presents today status post fall. Patient has right knee pain, plain films show small amount of joint effusion, no obvious laxity on exam, minimal tenderness to palpation. Patient was ambulated here in the ED, he is able to ambulate at his baseline without significant discomfort. No need for further diagnostic imaging here in the emergency room. Patient's wife reports that they are in the process of getting him to a nursing facility. I consult the case management reviewed patient's file. They made numerous phone calls to facilities with no open rooms at this time. Patient will be discharged home with face-to-face evaluation including PT, RN, and nursing aid until he is able to find a nursing facility. They're given strict return precautions, both the patient and his wife verbalized understanding and agreement today's plan had no further questions or concerns at time of discharge     New Prescriptions New Prescriptions   No medications on file       Okey Regal, PA-C 05/23/16 [place] Liu, MD 05/23/16 2034

## 2016-05-23 NOTE — Progress Notes (Addendum)
CSW consulted as Patient has been working with [facility] and [facility] Internal Medicine CSW for facility placement. Per CSW note on yesterday, Patient's FL-2 was faxed out to [facility], [facility], and [facility].   This CSW contacted [facility] and left a VM with Lee in admissions re: referral status. CSW contacted [facility] and left a VM with Claiborne Billings in admissions re: referral status. CSW contacted [facility] and per Tammy, she is unsure if Patient's insurance, [employer], is in network. CSW contacted CSW Asst. Director who after review does not feel that Patient will meet [employer]'s auth criteria for SNF placement and will instead need long term care. It appears that Patient and Patient's wife have applied for special assistance Medicaid for LTC in July but it is unclear if that has taken effect as of yet. CSW Asst. Director has reached out to [employer], [place] with Internal Medicine to contact this CSW for contunity of care.   EDP reports that Patient is stable to return home at this time, and plans to discharge Patient home with home health, with  LTC search continued from community. CSW spoke with [name], CSW who will continue to follow for LTC placement.   CSW engaged with Patient and his wife at bedside. Patient's wife reports that her mother passed away her on yesterday morning and reports that she also cares for 5 grandchildren  (the two oldest live with them and the three youngest come at night while Patient's daughter works). CSW provided Patient's wife with information on respite care and [facility]. Patient and wife appreciative of resources provided. CSW signing off. Please contact if new need(s) arise.      Emiliano Dyer, LCSW [facility] ED/42M Clinical Social Worker [phone]

## 2016-05-23 NOTE — Telephone Encounter (Signed)
[facility] Liaison continuing to work with confirming network status.  CSW emailed screenshot of [employer] website which indicates [facility] is a provider.

## 2016-05-23 NOTE — Care Management Note (Signed)
Case Management Note  Patient Details  Name: [name] MRN: [id number] Date of Birth: 08-Feb-1941  Subjective/Objective:                  76 year old male presents status post fall. /Home with spouse.  Action/Plan: Follow for disposition needs.   Expected Discharge Date:  05/23/16               Expected Discharge Plan:  [place]  In-House Referral:  Clinical Social Work  Discharge planning Services  CM Consult  Post Acute Care Choice:  NA Choice offered to:  Patient, Spouse  DME Arranged:  N/A DME Agency:  NA  HH Arranged:  RN, PT, Nurse's Aide [place] Agency:  [facility] (formerly [employer])  Status of Service:  Completed, signed off  If discussed at [name], dates discussed:    Additional Comments: CM spoke with patient and wife concerning discharge planning. Pt offered choice for [facility] for [facility] services upon discharge. Per pt choice [facility] to provide [facility] services.  [employer] rep Christa See, RN contacted concerning new referral. Pt to discharge home today.  No DME needs stated.     Fuller Mandril, RN 05/23/2016, 10:21 AM

## 2016-05-23 NOTE — Discharge Instructions (Signed)
Please contact her primary care provider and inform them of today's visit and all relevant data. Please continue to monitor for openings at preferred living facilities. If you to return to any new or worsening signs or symptoms please return for reevaluation.

## 2016-05-24 ENCOUNTER — Ambulatory Visit (HOSPITAL_COMMUNITY)
Admission: RE | Admit: 2016-05-24 | Discharge: 2016-05-24 | Disposition: A | Discharge status: Home / Self Care | Payer: PPO | Source: Ambulatory Visit | Attending: Internal Medicine | Admitting: Internal Medicine

## 2016-05-24 ENCOUNTER — Ambulatory Visit (INDEPENDENT_AMBULATORY_CARE_PROVIDER_SITE_OTHER): Payer: PPO | Admitting: Internal Medicine

## 2016-05-24 VITALS — BP 142/50 | HR 87 | Temp 98.6°F | Ht 67.0 in | Wt 196.2 lb

## 2016-05-24 DIAGNOSIS — I5032 Chronic diastolic (congestive) heart failure: Secondary | ICD-10-CM | POA: Diagnosis not present

## 2016-05-24 DIAGNOSIS — S6991XA Unspecified injury of right wrist, hand and finger(s), initial encounter: Secondary | ICD-10-CM | POA: Diagnosis not present

## 2016-05-24 DIAGNOSIS — I872 Venous insufficiency (chronic) (peripheral): Secondary | ICD-10-CM

## 2016-05-24 DIAGNOSIS — R600 Localized edema: Secondary | ICD-10-CM

## 2016-05-24 DIAGNOSIS — M19041 Primary osteoarthritis, right hand: Secondary | ICD-10-CM | POA: Diagnosis not present

## 2016-05-24 DIAGNOSIS — R609 Edema, unspecified: Secondary | ICD-10-CM

## 2016-05-24 DIAGNOSIS — M79644 Pain in right finger(s): Secondary | ICD-10-CM | POA: Insufficient documentation

## 2016-05-24 DIAGNOSIS — M79641 Pain in right hand: Secondary | ICD-10-CM

## 2016-05-24 DIAGNOSIS — M85841 Other specified disorders of bone density and structure, right hand: Secondary | ICD-10-CM | POA: Insufficient documentation

## 2016-05-24 DIAGNOSIS — Z9181 History of falling: Secondary | ICD-10-CM

## 2016-05-24 DIAGNOSIS — I708 Atherosclerosis of other arteries: Secondary | ICD-10-CM | POA: Insufficient documentation

## 2016-05-24 DIAGNOSIS — Z960 Presence of urogenital implants: Secondary | ICD-10-CM

## 2016-05-24 DIAGNOSIS — I10 Essential (primary) hypertension: Secondary | ICD-10-CM

## 2016-05-24 DIAGNOSIS — R296 Repeated falls: Secondary | ICD-10-CM

## 2016-05-24 DIAGNOSIS — I110 Hypertensive heart disease with heart failure: Secondary | ICD-10-CM

## 2016-05-24 DIAGNOSIS — R32 Unspecified urinary incontinence: Secondary | ICD-10-CM

## 2016-05-24 MED ORDER — FUROSEMIDE 40 MG PO TABS: 40.0000 mg | ORAL_TABLET | Freq: Every day | ORAL | 1 refills | Status: DC

## 2016-05-24 NOTE — Progress Notes (Signed)
    CC: lower extremity swelling  HPI: [name] is a 75 y.o. male with PMHx of HTN, T2DM, and Depression who presents to the clinic for follow up for bilateral lower extremity edema.   Patient has a history of chronic dependent lower extremity edema secondary to vascular insufficiency. Patient has mild chronic HFpEF. When seen in clinic 2 weeks ago, patient had worsened lower extremity edema likely secondary to non-compliance with compression stocking and elevating his lower extremities. He denied lower extremity pain. He denies shortness of breath or weight gain. Patient was recently started on lasix 40 mg daily and it was uptitrated to 80 mg daily at the previous visit for the dependent edema. He has had minimal improvement on the 80 mg dosage. He has not been wearing the compression stocking or elevating his legs. He is at his baseline weight and denies dyspnea.  Patient fell yesterday and EMS came to the house. CT head and xrays were negative for acute abnormality. Patient fell again on his way into clinic- falling back on his wife. They both deny hitting their head, but patient does complain of right 4th digit pain in his hand. Patient has a history of frequent falls. They are currently in the process of nursing home placement. His has home health, PT, and RN ordered for home.   Past Medical History:  Diagnosis Date  . Arthritis    "maybe"  . 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 CHF (congestive heart failure) ([place])   . Depression   . Hyperlipemia   . Hypertension   . Sleep apnea    "my machine [place]; got to get me another one" (09/01/2014)  . Stroke [facility]) 2009   left thalamic infarct, residual LUE weakness  . TIA (transient ischemic attack)    multiple in past, most recently in 2011  . Type II diabetes mellitus ([place])     Review of Systems: A complete ROS  was negative except as noted in HPI.   Physical Exam: Vitals:   05/24/16 1034  BP: (!) 168/56  Pulse: 87  Temp: 98.6 F (37 C)  TempSrc: Oral  SpO2: 96%  Weight: 196 lb 3.2 oz (89 kg)  Height: 5\' 7"  (1.702 m)   General: Vital signs reviewed.  Patient is chronically ill appearing, in no acute distress and cooperative with exam.  Cardiovascular: RRR, 3/6 systolic murmur. Pulmonary/Chest: Clear to auscultation bilaterally, no wheezes, rales, or rhonchi. Abdominal: Soft, non-tender, non-distended, BS + GU: Foley in place, urine yellow Extremities: 3+ pitting lower extremity edema bilaterally to knees. Calves are non-tender bilaterally. MSK: Tenderness on palpation of right 4th digit. Normal sensation. Normal capillary refill. Normal right radial pulse. Skin: LLE wound is well healed.  Assessment & Plan:  See encounters tab for problem based medical decision making. Patient discussed with Dr. Daryll Drown

## 2016-05-24 NOTE — Patient Instructions (Signed)
FOR YOUR LEG SWELLING, TAKE LASIX (FUROSEMIDE) 40 MG ONCE A DAY, KEEP YOUR LEGS ELEVATED, AND WEAR COMPRESSION STOCKINGS.  WE WILL CHECK YOUR LAB WORK TODAY.  GIVEN YOUR FALL AND HAND PAIN, WE WILL OBTAIN AN XRAY OF YOUR RIGHT HAND.

## 2016-05-24 NOTE — Assessment & Plan Note (Signed)
Patient has a history of chronic dependent lower extremity edema secondary to vascular insufficiency. Patient has mild chronic HFpEF. When seen in clinic 2 weeks ago, patient had worsened lower extremity edema likely secondary to non-compliance with compression stocking and elevating his lower extremities. He denied lower extremity pain. He denies shortness of breath or weight gain. Patient was recently started on lasix 40 mg daily and it was uptitrated to 80 mg daily at the previous visit for the dependent edema. He has had minimal improvement on the 80 mg dosage. He has not been wearing the compression stocking or elevating his legs. He is at his baseline weight and denies dyspnea.  Assessment: Chronic Dependent Edema 2/2 Venous Insufficiency  Plan: -Decrease Lasix to 40 mg QD given minimal improvement and increasing creatinine -Repeat BMET -Encourage compression stockings and elevation

## 2016-05-24 NOTE — Assessment & Plan Note (Signed)
Patient has a chronic foley in place due to chronic urinary incontinence placed recently by Urology. Patient will follow up with Urology next week for foley change. Urine appears yellow and clear. No gross hematuria. I reiterated the importance of keeping his foley bag below his bladder to prevent reflux.

## 2016-05-24 NOTE — Assessment & Plan Note (Signed)
Patient fell yesterday and EMS came to the house. CT head and xrays were negative for acute abnormality. Patient fell again on his way into clinic- falling back on his wife. They both deny hitting their head, but patient does complain of right 4th digit pain in his hand. Patient has a history of frequent falls. They are currently in the process of nursing home placement. His has home health, PT, and RN ordered for home. Given right hand pain, will check an xray given fall.  Plan: -Continue HH RN, PT, Aide -Right hand xray

## 2016-05-24 NOTE — Assessment & Plan Note (Signed)
BP Readings from Last 3 Encounters:  05/24/16 (!) 142/50  05/23/16 164/68  05/09/16 (!) 135/53    Lab Results  Component Value Date   NA 149 (H) 04/25/2016   K 3.9 04/25/2016   CREATININE 1.33 (H) 04/25/2016    Assessment: Blood pressure control:  Controlled Progress toward BP goal:   At goal Comments: Compliant with lisinopril 40 mg daily, and Coreg 12.5 mg BID  Plan: Medications:  continue current medications Other plans: Follow up with PCP

## 2016-05-25 ENCOUNTER — Telehealth: Payer: Self-pay | Admitting: Internal Medicine

## 2016-05-25 DIAGNOSIS — I69315 Cognitive social or emotional deficit following cerebral infarction: Secondary | ICD-10-CM | POA: Diagnosis not present

## 2016-05-25 DIAGNOSIS — I509 Heart failure, unspecified: Secondary | ICD-10-CM | POA: Diagnosis not present

## 2016-05-25 DIAGNOSIS — I110 Hypertensive heart disease with heart failure: Secondary | ICD-10-CM | POA: Diagnosis not present

## 2016-05-25 DIAGNOSIS — R600 Localized edema: Secondary | ICD-10-CM | POA: Diagnosis not present

## 2016-05-25 DIAGNOSIS — Z4800 Encounter for change or removal of nonsurgical wound dressing: Secondary | ICD-10-CM | POA: Diagnosis not present

## 2016-05-25 DIAGNOSIS — M25561 Pain in right knee: Secondary | ICD-10-CM | POA: Diagnosis not present

## 2016-05-25 DIAGNOSIS — I69332 Monoplegia of upper limb following cerebral infarction affecting left dominant side: Secondary | ICD-10-CM | POA: Diagnosis not present

## 2016-05-25 DIAGNOSIS — Z9181 History of falling: Secondary | ICD-10-CM | POA: Diagnosis not present

## 2016-05-25 DIAGNOSIS — F4323 Adjustment disorder with mixed anxiety and depressed mood: Secondary | ICD-10-CM | POA: Diagnosis not present

## 2016-05-25 DIAGNOSIS — S81802D Unspecified open wound, left lower leg, subsequent encounter: Secondary | ICD-10-CM | POA: Diagnosis not present

## 2016-05-25 DIAGNOSIS — Z87891 Personal history of nicotine dependence: Secondary | ICD-10-CM | POA: Diagnosis not present

## 2016-05-25 DIAGNOSIS — W010XXD Fall on same level from slipping, tripping and stumbling without subsequent striking against object, subsequent encounter: Secondary | ICD-10-CM | POA: Diagnosis not present

## 2016-05-25 DIAGNOSIS — F0150 Vascular dementia without behavioral disturbance: Secondary | ICD-10-CM | POA: Diagnosis not present

## 2016-05-25 DIAGNOSIS — M6289 Other specified disorders of muscle: Secondary | ICD-10-CM | POA: Diagnosis not present

## 2016-05-25 DIAGNOSIS — I69318 Other symptoms and signs involving cognitive functions following cerebral infarction: Secondary | ICD-10-CM | POA: Diagnosis not present

## 2016-05-25 DIAGNOSIS — E119 Type 2 diabetes mellitus without complications: Secondary | ICD-10-CM | POA: Diagnosis not present

## 2016-05-25 DIAGNOSIS — E785 Hyperlipidemia, unspecified: Secondary | ICD-10-CM | POA: Diagnosis not present

## 2016-05-25 LAB — BMP8+ANION GAP
ANION GAP: 18 mmol/L (ref 10.0–18.0)
BUN/Creatinine Ratio: 25 — ABNORMAL HIGH (ref 10–24)
BUN: 36 mg/dL — ABNORMAL HIGH (ref 8–27)
CALCIUM: 9.1 mg/dL (ref 8.6–10.2)
CO2: 28 mmol/L (ref 18–29)
CREATININE: 1.42 mg/dL — AB (ref 0.76–1.27)
Chloride: 97 mmol/L (ref 96–106)
GFR calc Af Amer: 56 mL/min/{1.73_m2} — ABNORMAL LOW (ref >59)
GFR calc non Af Amer: 49 mL/min/{1.73_m2} — ABNORMAL LOW (ref >59)
Glucose: 339 mg/dL — ABNORMAL HIGH (ref 65–99)
POTASSIUM: 3.5 mmol/L (ref 3.5–5.2)
SODIUM: 143 mmol/L (ref 134–144)

## 2016-05-25 NOTE — Telephone Encounter (Signed)
[facility] nurse calling to ask for VO.  Please call back.

## 2016-05-25 NOTE — Telephone Encounter (Signed)
Attempted to call with no answer.  

## 2016-05-25 NOTE — Telephone Encounter (Signed)
Opened services today, HHN requested for: 2xweek for 4 weeks VO given, do you agree?

## 2016-05-28 ENCOUNTER — Telehealth: Payer: Self-pay

## 2016-05-28 DIAGNOSIS — E785 Hyperlipidemia, unspecified: Secondary | ICD-10-CM | POA: Diagnosis not present

## 2016-05-28 DIAGNOSIS — Z4800 Encounter for change or removal of nonsurgical wound dressing: Secondary | ICD-10-CM | POA: Diagnosis not present

## 2016-05-28 DIAGNOSIS — M6289 Other specified disorders of muscle: Secondary | ICD-10-CM | POA: Diagnosis not present

## 2016-05-28 DIAGNOSIS — M25561 Pain in right knee: Secondary | ICD-10-CM | POA: Diagnosis not present

## 2016-05-28 DIAGNOSIS — W010XXD Fall on same level from slipping, tripping and stumbling without subsequent striking against object, subsequent encounter: Secondary | ICD-10-CM | POA: Diagnosis not present

## 2016-05-28 DIAGNOSIS — S81802D Unspecified open wound, left lower leg, subsequent encounter: Secondary | ICD-10-CM | POA: Diagnosis not present

## 2016-05-28 DIAGNOSIS — Z87891 Personal history of nicotine dependence: Secondary | ICD-10-CM | POA: Diagnosis not present

## 2016-05-28 DIAGNOSIS — I69332 Monoplegia of upper limb following cerebral infarction affecting left dominant side: Secondary | ICD-10-CM | POA: Diagnosis not present

## 2016-05-28 DIAGNOSIS — I69318 Other symptoms and signs involving cognitive functions following cerebral infarction: Secondary | ICD-10-CM | POA: Diagnosis not present

## 2016-05-28 DIAGNOSIS — R600 Localized edema: Secondary | ICD-10-CM | POA: Diagnosis not present

## 2016-05-28 DIAGNOSIS — Z9181 History of falling: Secondary | ICD-10-CM | POA: Diagnosis not present

## 2016-05-28 DIAGNOSIS — I69315 Cognitive social or emotional deficit following cerebral infarction: Secondary | ICD-10-CM | POA: Diagnosis not present

## 2016-05-28 DIAGNOSIS — F4323 Adjustment disorder with mixed anxiety and depressed mood: Secondary | ICD-10-CM | POA: Diagnosis not present

## 2016-05-28 DIAGNOSIS — I509 Heart failure, unspecified: Secondary | ICD-10-CM | POA: Diagnosis not present

## 2016-05-28 DIAGNOSIS — I110 Hypertensive heart disease with heart failure: Secondary | ICD-10-CM | POA: Diagnosis not present

## 2016-05-28 DIAGNOSIS — E119 Type 2 diabetes mellitus without complications: Secondary | ICD-10-CM | POA: Diagnosis not present

## 2016-05-28 DIAGNOSIS — F0150 Vascular dementia without behavioral disturbance: Secondary | ICD-10-CM | POA: Diagnosis not present

## 2016-05-28 NOTE — Telephone Encounter (Signed)
Eric from [facility] requesting VO. Please call back.

## 2016-05-28 NOTE — Telephone Encounter (Signed)
I agree. Thank you.

## 2016-05-28 NOTE — Telephone Encounter (Signed)
CSW placed call to [place] Liaison to inquire if add'l information had been received regarding insurance network status.  Message left requesting return call.

## 2016-05-28 NOTE — Telephone Encounter (Signed)
[facility] is in-network for [name].  [name] Currently working on admit from home and will move to [name].  CSW informed [name], spouse is agreeable and would like to move forward.  Information faxed per request.

## 2016-05-29 ENCOUNTER — Other Ambulatory Visit: Payer: Self-pay | Admitting: *Deleted

## 2016-05-29 ENCOUNTER — Encounter: Payer: Self-pay | Admitting: *Deleted

## 2016-05-29 ENCOUNTER — Telehealth: Payer: Self-pay | Admitting: Licensed Clinical Social Worker

## 2016-05-29 MED ORDER — SIMVASTATIN 20 MG PO TABS: 20.0000 mg | ORAL_TABLET | Freq: Every day | ORAL | 11 refills | Status: DC

## 2016-05-29 NOTE — Patient Outreach (Signed)
[facility]) Care Management  05/29/2016  FERRELL CLAIBORNE 08/19/1941 [id number]  CSW received an In [employer] from [name], Licensed Clinical Social Worker with [facility], indicating that she is working with patient and patient's wife, Harlie Ragle regarding initiating long-term care placement for patient.  Mrs. Jeralyn Ruths reported receiving a call from Mrs. Lewellyn, while CSW was on vacation, indicating that she is no longer able to adequately care for patient in the home, as patient's needs have exceeded Mrs. Phillips's scope of abilities.  Mrs. Fountain admitted to Mrs. Jeralyn Ruths that she has been looking in to placement for patient, along with the assistance of CSW.  CSW was able to fax patient's FL-2 Form and basic demographic information to [facility] and [facility], both facilities of interest to patient and Mrs. Parodi.  Jolyne Loa, Admissions Coordinator with [facility], agreed to review patient's information and verify insurance for possible placement arrangements.  However, Mrs. Particia Jasper explained to Mrs. Oesterling that she is unable to accept patient, who currently resides in the community, as patient would not be eligible for the 3 day inpatient hospital qualifying stay in order for Medicare to cover the first 20 days of skilled care.  [facility] admitted to [facility] that they currently have no male beds available in their Alzheimer's/Dementia Ward.  This information was reported to Mrs. Weatherholtz, who voiced understanding.  CSW agreed to fax patient's FL-2 Form to other facilities, once Mrs. Lupinski was willing to broaden her search.  Mrs. Jeralyn Ruths provided Mrs. Wuest with the contact number for [facility] [phone]), explaining that she will need to request placement assistance due to patient's care needs exceeding her ability.  Mrs. Antenucci requested that Mrs. Jeralyn Ruths fax patient's FL-2 Form, PASARR# and demographic  information to patient's insurance provider, as well as to the list of facilities that are in network with patient's insurance ([employer] primary and Long-Term Care Medicaid secondary) and have wander guard systems.  These facilities included all of the following:  [facility], [facility] and [facility]. Mrs. Jeralyn Ruths placed a call to [facility] regarding availability.  Mrs. Jeralyn Ruths was told that the admissions coordinator at the facility was in the process of verifying patient's insurance benefits. Mrs. Jeralyn Ruths went on to say that she received a call from Tammy with [facility], requesting additional information on patient. Tammy agreed to perform a home visit with patient to determine eligibility and ensure that their facility is able to accommodate his medical and psychosocial needs.  [facility] has submitted patient's information to [employer] and is currently awaiting approval.  Mrs. Jeralyn Ruths was able to make contact with patient's wife, Lavante Toso to report findings of the above information regarding placement for patient.  CSW offered assistance to Mrs. Jeralyn Ruths regarding patient's long-term care placement arrangements.  Mrs. Nasser agreed to working with Mrs. Jeralyn Ruths regarding placement, moving forward. CSW will perform a case closure on patient, as all goals of treatment have been met from social work standpoint and no additional social work needs have been identified at this time.  CSW will notify patient's RNCM with [facility], Valente David of CSW's plans to close patient's case.  CSW will fax an update to patient's Primary Care Physician, Dr.  Dennard Schaumann to ensure that they are aware of CSW's involvement with patient's plan of care.  CSW will submit a case closure request to [facility]  [place], Care Management Assistant with [facility] [facility], in the form of  an In [employer].   [name], BSW, MSW, LCSW  Licensed Education officer, [facility]  Mailing [place] [address], [place], [place] [address] Physical Address-[address], [place], [place] [address] Toll Free Main # [phone] Fax # [phone] Cell # [phone]  Fax # [phone]  [email or web address]

## 2016-05-29 NOTE — Telephone Encounter (Signed)
CSW received call from Tammy B with [facility].  Tammy requesting contact information Mr. Headlee, as liaison would like to perform home visit today.  Facility has submitted chart information to insurance, awaiting approval.  CSW provided Ms. Manlove as a main contact for patient.

## 2016-05-30 ENCOUNTER — Other Ambulatory Visit: Payer: Self-pay | Admitting: Licensed Clinical Social Worker

## 2016-05-30 ENCOUNTER — Telehealth: Payer: Self-pay | Admitting: Licensed Clinical Social Worker

## 2016-05-30 DIAGNOSIS — I69332 Monoplegia of upper limb following cerebral infarction affecting left dominant side: Secondary | ICD-10-CM | POA: Diagnosis not present

## 2016-05-30 DIAGNOSIS — M6289 Other specified disorders of muscle: Secondary | ICD-10-CM | POA: Diagnosis not present

## 2016-05-30 DIAGNOSIS — Z4800 Encounter for change or removal of nonsurgical wound dressing: Secondary | ICD-10-CM | POA: Diagnosis not present

## 2016-05-30 DIAGNOSIS — I69318 Other symptoms and signs involving cognitive functions following cerebral infarction: Secondary | ICD-10-CM | POA: Diagnosis not present

## 2016-05-30 DIAGNOSIS — W010XXD Fall on same level from slipping, tripping and stumbling without subsequent striking against object, subsequent encounter: Secondary | ICD-10-CM | POA: Diagnosis not present

## 2016-05-30 DIAGNOSIS — R600 Localized edema: Secondary | ICD-10-CM | POA: Diagnosis not present

## 2016-05-30 DIAGNOSIS — F0150 Vascular dementia without behavioral disturbance: Secondary | ICD-10-CM | POA: Diagnosis not present

## 2016-05-30 DIAGNOSIS — I509 Heart failure, unspecified: Secondary | ICD-10-CM | POA: Diagnosis not present

## 2016-05-30 DIAGNOSIS — N319 Neuromuscular dysfunction of bladder, unspecified: Secondary | ICD-10-CM | POA: Diagnosis not present

## 2016-05-30 DIAGNOSIS — E119 Type 2 diabetes mellitus without complications: Secondary | ICD-10-CM | POA: Diagnosis not present

## 2016-05-30 DIAGNOSIS — M25561 Pain in right knee: Secondary | ICD-10-CM | POA: Diagnosis not present

## 2016-05-30 DIAGNOSIS — Z87891 Personal history of nicotine dependence: Secondary | ICD-10-CM | POA: Diagnosis not present

## 2016-05-30 DIAGNOSIS — F4323 Adjustment disorder with mixed anxiety and depressed mood: Secondary | ICD-10-CM | POA: Diagnosis not present

## 2016-05-30 DIAGNOSIS — E785 Hyperlipidemia, unspecified: Secondary | ICD-10-CM | POA: Diagnosis not present

## 2016-05-30 DIAGNOSIS — I110 Hypertensive heart disease with heart failure: Secondary | ICD-10-CM | POA: Diagnosis not present

## 2016-05-30 DIAGNOSIS — Z9181 History of falling: Secondary | ICD-10-CM | POA: Diagnosis not present

## 2016-05-30 DIAGNOSIS — S81802D Unspecified open wound, left lower leg, subsequent encounter: Secondary | ICD-10-CM | POA: Diagnosis not present

## 2016-05-30 DIAGNOSIS — I69315 Cognitive social or emotional deficit following cerebral infarction: Secondary | ICD-10-CM | POA: Diagnosis not present

## 2016-05-30 NOTE — Telephone Encounter (Signed)
CSW received voice mail from Ms. Blakely regarding potential admission for [name].  Pt is scheduled for a home visit today 05/30/16.  Admission in need of order for skilled and PT notes.   CSW returned call to Ms. Shary Decamp, message left notifying of pt is active with [facility].  Phone number to [facility] provided.

## 2016-05-30 NOTE — Telephone Encounter (Signed)
I agree. Thank you.

## 2016-05-30 NOTE — Telephone Encounter (Signed)
VO order given to Randall Hiss (PT) for 2 times per for 4 weeks. Do you agree?

## 2016-05-30 NOTE — Telephone Encounter (Signed)
Lm for rtc to eric to please rtc

## 2016-05-31 DIAGNOSIS — R531 Weakness: Secondary | ICD-10-CM | POA: Diagnosis not present

## 2016-05-31 DIAGNOSIS — I639 Cerebral infarction, unspecified: Secondary | ICD-10-CM | POA: Diagnosis not present

## 2016-05-31 DIAGNOSIS — I10 Essential (primary) hypertension: Secondary | ICD-10-CM | POA: Diagnosis not present

## 2016-05-31 DIAGNOSIS — F0150 Vascular dementia without behavioral disturbance: Secondary | ICD-10-CM | POA: Diagnosis not present

## 2016-05-31 DIAGNOSIS — E119 Type 2 diabetes mellitus without complications: Secondary | ICD-10-CM | POA: Diagnosis not present

## 2016-05-31 DIAGNOSIS — F329 Major depressive disorder, single episode, unspecified: Secondary | ICD-10-CM | POA: Diagnosis not present

## 2016-05-31 DIAGNOSIS — N319 Neuromuscular dysfunction of bladder, unspecified: Secondary | ICD-10-CM | POA: Diagnosis not present

## 2016-05-31 DIAGNOSIS — G4733 Obstructive sleep apnea (adult) (pediatric): Secondary | ICD-10-CM | POA: Diagnosis not present

## 2016-06-01 NOTE — Telephone Encounter (Signed)
That's fantastic news. Do you still need me to write that prescription for you?

## 2016-06-01 NOTE — Telephone Encounter (Signed)
CSW received call from [name]  Pt was admitted to [facility] on 05/31/16.

## 2016-06-04 NOTE — Progress Notes (Signed)
Internal Medicine Clinic Attending  Case discussed with Dr. Burns at the time of the visit.  We reviewed the resident's history and exam and pertinent patient test results.  I agree with the assessment, diagnosis, and plan of care documented in the resident's note.  

## 2016-06-08 ENCOUNTER — Encounter: Payer: Self-pay | Admitting: Internal Medicine

## 2016-06-08 ENCOUNTER — Non-Acute Institutional Stay (SKILLED_NURSING_FACILITY): Payer: PPO | Admitting: Internal Medicine

## 2016-06-08 DIAGNOSIS — I10 Essential (primary) hypertension: Secondary | ICD-10-CM | POA: Diagnosis not present

## 2016-06-08 DIAGNOSIS — R269 Unspecified abnormalities of gait and mobility: Secondary | ICD-10-CM

## 2016-06-08 DIAGNOSIS — R600 Localized edema: Secondary | ICD-10-CM | POA: Diagnosis not present

## 2016-06-08 DIAGNOSIS — E119 Type 2 diabetes mellitus without complications: Secondary | ICD-10-CM | POA: Diagnosis not present

## 2016-06-08 DIAGNOSIS — F329 Major depressive disorder, single episode, unspecified: Secondary | ICD-10-CM | POA: Diagnosis not present

## 2016-06-08 DIAGNOSIS — F0390 Unspecified dementia without behavioral disturbance: Secondary | ICD-10-CM | POA: Diagnosis not present

## 2016-06-08 DIAGNOSIS — F0150 Vascular dementia without behavioral disturbance: Secondary | ICD-10-CM

## 2016-06-08 NOTE — Progress Notes (Signed)
Patient ID: [name], male   DOB: [birth year], 75 y.o.   MRN: [id number]   Location:   [place] Room Number: 217-A Place of Service:  SNF (31) Provider:  Granville Lewis, PA-C  Aldine Contes, MD  Patient Care Team: Aldine Contes, MD as PCP - General (Internal Medicine) Webb Laws, OD as Referring Physician (Optometry) Bjorn Loser, MD as Consulting Physician (Urology) Hayden Pedro, MD as Consulting Physician (Ophthalmology) Charlsie Merles, MD as Referring Physician (Internal Medicine)  Extended Emergency Contact Information Primary Emergency Contact: Everton,[name] Address: [address]           [address] [place] Phone: [phone] Relation: Spouse Secondary Emergency Contact: Ellery,[name]  [place] Relation: Daughter  Code Status:  DNR Goals of care: Advanced Directive information Advanced Directives 06/08/2016  Does patient have an advance directive? Yes  Type of Advance Directive Out of facility DNR (pink MOST or yellow form)  Does patient want to make changes to advanced directive? No - Patient declined  Copy of advanced directive(s) in chart? Yes  Pre-existing out of facility DNR order (yellow form or pink MOST form) -  Some encounter information is confidential and restricted. Go to Review Flowsheets activity to see all data.    Chief complaint-acute visit follow-up tonic medical issues-with recent admission to facility HPI:  Pt is a 75 y.o. male seen today for an acute visit  follow-up of medical conditions status post recent admission to the facility. Apparently admitted from home with increasing care needs-with history of falls.  Currently he is resting in bed comfortably has no acute complaints-nursing staff has left a note about possible leg pain but he is denying that today although this will have to be watched.  He does have a history of chronic lower extremity  edema appears he's been ordered compression hose-he is also on Lasix currently 40 mg a day-he does have 1+ edema not sure if this is his baseline he is not complaining of any shortness of breath or significant weight gain.  We will write orders to monitor weights closely and notify provider of significant weight gain.  He does have a history per previous note of mild chronic heart failure Results are issues appear to be relatively stable he does not really have any acute complaints today he is resting in bed comfortably vital signs appear to be stable.  He does have a history of dementia is on Aricept as well as Namenda I suspect this may be part of the reason he is admitted to skilled nursing with increasing care needs.  He also has a history of depression and continues on Zoloft.  He is a type II diabetic his blood sugars will have to be monitored currently on no medication.       Past Medical History:  Diagnosis Date  . Arthritis    "maybe"  . 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 CHF (congestive heart failure) ([place])   . Depression   . Hyperlipemia   . Hypertension   . Sleep apnea    "my machine [place]; got to get me another one" (09/01/2014)  . Stroke [facility]) 2009   left thalamic infarct, residual LUE weakness  . TIA (transient ischemic attack)    multiple in past, most recently in 2011  . Type II diabetes mellitus ([place])  Past Surgical History:  Procedure Laterality Date  . CARDIAC CATHETERIZATION  07/2010  . CRANIOTOMY  05/2008   for clipping of an incidental asymptomatic unruptured/notes 02/13/2011  . KNEE ARTHROSCOPY Left   . 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 SHOULDER ARTHROSCOPY W/ ROTATOR CUFF REPAIR Right     Allergies  Allergen Reactions  . Pioglitazone Other (See Comments)    Edema   . Rosiglitazone Maleate Other (See Comments)     edema      Medication List       Accurate as of 06/08/16 12:50 PM. Always use your most recent med list.          carvedilol 12.5 MG tablet Commonly known as:  COREG Take 1 tablet (12.5 mg total) by mouth 2 (two) times daily.   clopidogrel 75 MG tablet Commonly known as:  PLAVIX Take 1 tablet (75 mg total) by mouth daily.   diclofenac sodium 1 % Gel Commonly known as:  VOLTAREN Apply 4 g topically 4 (four) times daily.   donepezil 10 MG tablet Commonly known as:  ARICEPT Take 1 tablet (10 mg total) by mouth at bedtime.   LIQUID ANTACID PO Take 30 mLs by mouth every 4 (four) hours as needed (for indegestion).   lisinopril 40 MG tablet Commonly known as:  PRINIVIL,ZESTRIL Take 1 tablet (40 mg total) by mouth daily.   memantine 28 MG Cp24 24 hr capsule Commonly known as:  NAMENDA XR Take 1 capsule (28 mg total) by mouth daily.   NATURAL BALANCE TEARS 0.4 % Soln Generic drug:  Hypromellose Apply 2 drops to eye 3 (three) times daily.   phenazopyridine 100 MG tablet Commonly known as:  PYRIDIUM Take 100 mg by mouth 3 (three) times daily as needed for pain.   prednisoLONE acetate 1 % ophthalmic suspension Commonly known as:  PRED FORTE Place into the left eye 4 (four) times daily.   sertraline 25 MG tablet Commonly known as:  ZOLOFT Take 1 tablet (25 mg total) by mouth daily.   simvastatin 20 MG tablet Commonly known as:  ZOCOR Take 1 tablet (20 mg total) by mouth at bedtime.   SYSTANE 0.4-0.3 % Soln Generic drug:  Polyethyl Glycol-Propyl Glycol Place 1 drop into both eyes 2 (two) times daily.   tamsulosin 0.4 MG Caps capsule Commonly known as:  FLOMAX Take 0.4 mg by mouth at bedtime.       Review of Systems   This is somewhat limited secondary to patient appears to be somewhat of a poor story and provided by nursing as well.  In general no complaints of fever or chills.  Skin is not complaining of rashes or itching.  Head ears eyes nose mouth  and throat does not complaining of sore throat or visual changes.  He does have a history of detached retina is on medication.  Respiratory does not complain of shortness breath or cough.  Cardiac no chest pain appears to have somewhat chronic lower extremity edema.  GI is not complaining of nausea vomiting diarrhea constipation or abdominal pain.  GU does not complain of dysuria.  Muscle skeletal is not currently complaining of joint pain or leg pain but this will have to be watched.  Neurologic does have a history of CVA and TIA is not complaining of any headache or dizziness continues on Plavix.  Psych does have a history of depression has a somewhat flat affect but is not overtly complaining of depression or anxiety.  Immunization History  Administered Date(s) Administered  . Influenza Split 06/25/2012  . Influenza Whole 07/18/2007, 08/18/2008, 08/09/2009, 06/14/2010, 06/20/2011  . Influenza,inj,Quad PF,36+ Mos 06/24/2013, 06/08/2014  . PPD Test 04/25/2016  . Pneumococcal Conjugate-13 01/06/2015  . Pneumococcal Polysaccharide-23 05/30/2008  . Tdap 06/20/2011   Pertinent  Health Maintenance Due  Topic Date Due  . LIPID PANEL  06/09/2015  . HEMOGLOBIN A1C  11/03/2015  . OPHTHALMOLOGY EXAM  04/12/2016  . INFLUENZA VACCINE  05/15/2016  . FOOT EXAM  08/21/2016  . COLONOSCOPY  08/29/2017  . PNA vac Low Risk Adult  Completed   Fall Risk  05/24/2016 05/09/2016 04/30/2016 04/25/2016 04/18/2016  Falls in the past year? Yes Yes Yes Yes Yes  Number falls in past yr: 2 or more - 2 or more 2 or more 2 or more  Injury with Fall? Yes Yes Yes No No  Risk Factor Category  High Fall Risk High Fall Risk High Fall Risk High Fall Risk High Fall Risk  Risk for fall due to : History of fall(s);Impaired balance/gait;Impaired mobility History of fall(s);Impaired balance/gait;Impaired mobility History of fall(s);Impaired balance/gait;Impaired mobility;Mental status change History of fall(s) History  of fall(s);Impaired balance/gait;Impaired vision;Medication side effect;Impaired mobility  Risk for fall due to (comments): - - - - -  Follow up Education provided;Falls prevention discussed Falls prevention discussed Education provided;Falls prevention discussed - Falls prevention discussed   Functional Status Survey:    Vitals:   06/08/16 1239  BP: 122/67  Pulse: 72  Resp: 17  Temp: 97 F (36.1 C)  TempSrc: Oral  SpO2: 98%  Weight: 198 lb (89.8 kg)  Height: 5\' 7"  (1.702 m)   Body mass index is 31.01 kg/m. Physical Exam   General:in no acute distress and cooperative with exam. In bed comfortably  Skin is warm and dry. Eyes show acuity appears grossly intact he does wear prescription lenses.  His oropharynx is clear mucous membranes moist.  Chest is clear to auscultation there is no labored breathing-  Cardiovascular: RRR, 3/6 systolic murmur appears to have 1+ lower extremity edema compression hose were applied.   Abdominal: Soft, non-tender, non-distended, BS + GU: Foley in place, urine yellow Muscle skeletal does move all extremities 4 limited exam since she is in bed. Strength appears to be intact bilaterally I do not note any overt deformities of than arthritic.  Neurologic appears grossly to be intact at could not really appreciate true lateralizing findings her speech is clear.  Psych he is oriented to year not oriented to day did follow simple verbal commands without difficulty--was pleasant and cooperative appears to have mild cognitive impairments  .  Labs reviewed:  Recent Labs  04/18/16 1146 04/25/16 1126 05/24/16 1117  NA 144 149* 143  K 4.0 3.9 3.5  CL 100 104 97  CO2 29 31* 28  GLUCOSE 247* 202* 339*  BUN 30* 32* 36*  CREATININE 1.31* 1.33* 1.42*  CALCIUM 9.5 9.5 9.1   No results for input(s): AST, ALT, ALKPHOS, BILITOT, PROT, ALBUMIN in the last 8760 hours.  Recent Labs  11/10/15 1333  WBC 6.5  NEUTROABS 5.5  HGB 12.4*  HCT 39.9    MCV 84.0  PLT 152   Lab Results  Component Value Date   TSH 2.077 03/16/2013   Lab Results  Component Value Date   HGBA1C 6.0 08/03/2015   Lab Results  Component Value Date   CHOL 102 06/08/2014   HDL 47 06/08/2014   LDLCALC 46 06/08/2014   TRIG 47 06/08/2014  CHOLHDL 2.2 06/08/2014    Significant Diagnostic Results in last 30 days:  Dg Knee 2 Views Right  Result Date: 05/23/2016 CLINICAL DATA:  75 year old male post fall. Knee pain. Initial encounter. EXAM: RIGHT KNEE - 1-2 VIEW COMPARISON:  None. FINDINGS: Two-view examination right knee without evidence of fracture or dislocation. If fracture remained of high clinical concern, oblique views may be considered. Mild prominence at the patella region may represent soft tissue injury. 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 Small joint effusion. Vascular calcifications. On lateral view, there is either prominence of the popliteal artery with aneurysmal dilatation versus overlapping radiopaque structures. IMPRESSION: Two-view examination right knee without evidence of fracture or dislocation. If fracture remained of high clinical concern, oblique views may be considered. Mild prominence infrapatellar region may represent soft tissue injury. 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 Small joint effusion. Vascular calcifications. On lateral view, there is either prominence of the popliteal artery with aneurysmal dilatation versus overlapping radiopaque structures. Electronically Signed   By: Genia Del M.D.   On: 05/23/2016 07:50   Ct Head Wo Contrast  Result Date: 05/23/2016 CLINICAL DATA:  The patient fell in the bathroom this morning with a blow to the head. Initial encounter. EXAM: CT HEAD WITHOUT CONTRAST TECHNIQUE: Contiguous axial images were obtained from the base of the skull through the vertex without intravenous contrast. COMPARISON:  Head CT scan 11/10/2015. FINDINGS: There is cortical atrophy  and chronic microvascular ischemic change. Remote right basal ganglia lacunar infarct is identified. The patient is status post right craniotomy and right MCA aneurysm clipping as on the prior study. No evidence of acute abnormality including hemorrhage, infarct, mass lesion, mass effect, midline shift or abnormal extra-axial fluid collection. No hydrocephalus or pneumocephalus. No fracture. Imaged paranasal sinuses mastoid air cells are clear. IMPRESSION: No acute abnormality. Atrophy and chronic microvascular ischemic change. Status post right MCA aneurysm clipping. Electronically Signed   By: Inge Rise M.D.   On: 05/23/2016 07:29   Dg Shoulder Left  Result Date: 05/23/2016 CLINICAL DATA:  75 year old male. Fall. Soreness left shoulder. Initial encounter. EXAM: LEFT SHOULDER - 2+ VIEW COMPARISON:  11/10/2015 chest x-ray. FINDINGS: Degenerative changes glenohumeral joint and acromioclavicular joint without evidence of fracture or dislocation. Bones appear osteopenic. Visualized lungs clear. IMPRESSION: Degenerative changes glenohumeral joint and acromioclavicular joint without evidence of fracture or dislocation. Electronically Signed   By: Genia Del M.D.   On: 05/23/2016 07:46   Dg Hand Complete Right  Result Date: 05/24/2016 CLINICAL DATA:  Right fourth finger pain status post fall. EXAM: RIGHT HAND - COMPLETE 3+ VIEW COMPARISON:  None. FINDINGS: There is no evidence of fracture or dislocation. There is diffusely decreased osseous mineralization. Multifocal osteoarthritic changes are seen, worse at the radiocarpal joint. 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 There is soft tissue swelling of the second digit. Advanced calcific arterial atherosclerotic disease is seen. IMPRESSION: No acute fracture or dislocation identified about the right Hand. Osteopenia. Multifocal osteoarthritic changes. Calcific atherosclerosis. Electronically Signed   By: Fidela Salisbury M.D.   On: 05/24/2016 14:45    Assessment/Plan  #1 history of frailty and falls-again he will need PT and OT he has been placing skilled nursing apparently secondary to falls and increased care needs.  #2 dementia this appears to be mild-to-moderate-she is on Aricept and Namenda at this point continue supportive care.  #3 history of edema thought to be common A some vascular insufficiency and  apparently CHF-he is on Lasix 40 mg a day-he will need an updated metabolic panel for follow-up.  I have reviewed weights it appears his weight of 198 is down about 5 pounds from August 18-at this point monitor weights Monday Wednesday and Friday notify provider of gain greater than 3 pounds-he does not show signs of any respiratory distress or congestion but this will have to be watched.  #4 hypertension this appears stable currently he is on Coreg as well as Prinivil.  #5 history CVA he does not really appear to have overt deficits she does have a history of TIA as well he continues on Plavix.  #6 history of BPH continues on Flomax he does have an indwelling Foley catheter And has been followed by urology-I note he is also on Pyridium.  #7-history of hyperlipidemia he continues on Zocor.  #8 history diabetes type 2-we will need to monitor his CBGs before meals and at bedtime call provider if less than 60 or greater than 300.  This appears to be diet-controlled.  #9 history renal insufficiency-most recent creatinine of 1.42 on lab done on 05/24/2016 appears to be relatively baseline Will update this as well.  [id number] note greater than 45 minutes spent assessing patient-reviewing his chart-reviewing his labs-discussing his status with nursing staff and coordinating and formulating a plan of care for numerous diagnoses-of note greater than 50% of time spent coordinating a plan of care

## 2016-06-09 DIAGNOSIS — E119 Type 2 diabetes mellitus without complications: Secondary | ICD-10-CM | POA: Diagnosis not present

## 2016-06-09 DIAGNOSIS — R531 Weakness: Secondary | ICD-10-CM | POA: Diagnosis not present

## 2016-06-09 LAB — HEPATIC FUNCTION PANEL
ALK PHOS: 81 U/L (ref 25–125)
ALT: 20 U/L (ref 10–40)
AST: 12 U/L — AB (ref 14–40)
Bilirubin, Total: 0.3 mg/dL

## 2016-06-09 LAB — BASIC METABOLIC PANEL
BUN: 20 mg/dL (ref 4–21)
Creatinine: 1.1 mg/dL (ref 0.6–1.3)
GLUCOSE: 227 mg/dL
POTASSIUM: 4.2 mmol/L (ref 3.4–5.3)
SODIUM: 148 mmol/L — AB (ref 137–147)

## 2016-06-09 LAB — CBC AND DIFFERENTIAL
HCT: 34 % — AB (ref 41–53)
HEMOGLOBIN: 10.8 g/dL — AB (ref 13.5–17.5)
Platelets: 313 10*3/uL (ref 150–399)
WBC: 8.2 10^3/mL

## 2016-06-11 ENCOUNTER — Encounter: Payer: Self-pay | Admitting: Internal Medicine

## 2016-06-11 ENCOUNTER — Non-Acute Institutional Stay (SKILLED_NURSING_FACILITY): Payer: PPO | Admitting: Internal Medicine

## 2016-06-11 DIAGNOSIS — N489 Disorder of penis, unspecified: Secondary | ICD-10-CM

## 2016-06-11 DIAGNOSIS — E119 Type 2 diabetes mellitus without complications: Secondary | ICD-10-CM

## 2016-06-11 DIAGNOSIS — R8290 Unspecified abnormal findings in urine: Secondary | ICD-10-CM | POA: Diagnosis not present

## 2016-06-11 DIAGNOSIS — I10 Essential (primary) hypertension: Secondary | ICD-10-CM | POA: Diagnosis not present

## 2016-06-11 DIAGNOSIS — F0150 Vascular dementia without behavioral disturbance: Secondary | ICD-10-CM | POA: Diagnosis not present

## 2016-06-11 DIAGNOSIS — E1169 Type 2 diabetes mellitus with other specified complication: Secondary | ICD-10-CM

## 2016-06-11 DIAGNOSIS — Z79899 Other long term (current) drug therapy: Secondary | ICD-10-CM | POA: Diagnosis not present

## 2016-06-11 DIAGNOSIS — F4323 Adjustment disorder with mixed anxiety and depressed mood: Secondary | ICD-10-CM

## 2016-06-11 DIAGNOSIS — R531 Weakness: Secondary | ICD-10-CM | POA: Diagnosis not present

## 2016-06-11 DIAGNOSIS — E785 Hyperlipidemia, unspecified: Secondary | ICD-10-CM | POA: Diagnosis not present

## 2016-06-11 DIAGNOSIS — N4889 Other specified disorders of penis: Secondary | ICD-10-CM | POA: Diagnosis not present

## 2016-06-11 DIAGNOSIS — R319 Hematuria, unspecified: Secondary | ICD-10-CM | POA: Diagnosis not present

## 2016-06-11 DIAGNOSIS — N390 Urinary tract infection, site not specified: Secondary | ICD-10-CM | POA: Diagnosis not present

## 2016-06-11 DIAGNOSIS — N319 Neuromuscular dysfunction of bladder, unspecified: Secondary | ICD-10-CM

## 2016-06-11 NOTE — Progress Notes (Signed)
Patient ID: [name], male   DOB: 04-Jan-1941, 75 y.o.   MRN: [id number]    HISTORY AND PHYSICAL   DATE: 06/11/2016  Location:    [facility] Room Number: [id number] A Place of Service: SNF (31)   Extended Emergency Contact Information Primary Emergency Contact: [name] Address: [address]           [address] [place] Phone: [phone] Relation: Spouse Secondary Emergency Contact: [name]  [place] Relation: Daughter  Advanced Directive information Does patient have an advance directive?: Yes, Type of Advance Directive: Out of facility DNR (pink MOST or yellow form), Does patient want to make changes to advanced directive?: No - Patient declined  Chief Complaint  Patient presents with  . New Admit To SNF       blood in urine    HPI:  75 yo male seen today as a new admission into SNF from home. Wife unable to provide necessary care at home and chose to admit into SNF for long term care. He was taken to the ER on 8/9th after fall at home. Imaging of right knee revealed no acute process and showed small amt joint effusion. He was d/c'd home with [facility] PT, RN and homecare aid.  Nursing reports abnormal urine color for the last few days. He has a chronic foley cath and a hx dementia and DM. Wife present. Foley was changed over the weekend. Pyridium was started in yesterday. His wife is c/a penile lesion that was noted upon SNF admission per chart. He was tx with acyclovir 400mg  TID x 10 days (since 8/18th). CBG 133. No low BS reactions. He is a poor historian due to dementia. Hx obtained from chart, nursing and family. Na 148   Chronic LE edema with hx venous insufficiency - stable on   HTN - BP stable on coreg and lisinopril.   DM - diet controlled. CBG 133 today. A1c 6% in Oct 2016. Urine micro/Cr ratio 4.3 in 12/2014  Hyperlipidemia - stable on zocor. No LDL since 2015 (46 at the  time)  Depression/adjustment d/o - mood stable on zoloft  Dementia - stable on aricept/namenda. He has hx frequent falls  Hx CVA/TIA - stable on plavix. 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 He has left sided paresthesias  Chronic bladder pain/hx neurogenic bladder - has chronic foley cath. Currently on pyridium  Joint pain/arthritis - stable on diclofenac. He has gait dysfunction  Hx anemia - hgb 10.8  CKD - Cr 1.1  Past Medical History:  Diagnosis Date  . Arthritis    "maybe"  . 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 CHF (congestive heart failure) ([name]   . Depression   . Hyperlipemia   . Hypertension   . Sleep apnea    "my machine [place]; got to get me another one" (09/01/2014)  . Stroke [facility]) 2009   left thalamic infarct, residual LUE weakness  . TIA (transient ischemic attack)    multiple in past, most recently in 2011  . Type II diabetes mellitus ([place])     Past Surgical History:  Procedure Laterality Date  . CARDIAC CATHETERIZATION  07/2010  . CRANIOTOMY  05/2008   for clipping of an incidental asymptomatic unruptured/notes 02/13/2011  . KNEE ARTHROSCOPY Left   . Neck artery repaired from injury Right ~  [name]     Patient Care Team: Aldine Contes, MD as PCP - General (Internal Medicine) Webb Laws, OD as Referring Physician (Optometry) Bjorn Loser, MD as Consulting Physician (Urology) Hayden Pedro, MD as Consulting Physician (Ophthalmology) Charlsie Merles, MD as Referring Physician (Internal Medicine)  Social History   Social History  . Marital status: Married    Spouse name: [name]  . Number of children: 3  . Years of education: HS   Occupational History  . disabled    Social History Main Topics  . Smoking status: Former Smoker     Packs/day: 1.50    Years: 15.00    Types: Cigarettes    Quit date: 10/16/1983  . Smokeless tobacco: Never Used  . Alcohol use No  . Drug use: No  . Sexual activity: No   Other Topics Concern  . Not on file   Social History Narrative   Pt lives at home with spouse.   Caffeine Use: 1 cup daily     reports that he quit smoking about 32 years ago. His smoking use included Cigarettes. He has a 22.50 pack-year smoking history. He has never used smokeless tobacco. He reports that he does not drink alcohol or use drugs.  Family History  Problem Relation Age of Onset  . Stroke Mother   . Pneumonia Father   . Diabetes Sister   . Diabetes Brother   . Diabetes Paternal Aunt   . Colon cancer Neg Hx    Family Status  Relation Status  . Mother Deceased at age 65  . Father Deceased at age 16  . Sister   . Brother   . Paternal Aunt   . Neg Hx     Immunization History  Administered Date(s) Administered  . Influenza Split 06/25/2012  . Influenza Whole 07/18/2007, 08/18/2008, 08/09/2009, 06/14/2010, 06/20/2011  . Influenza,inj,Quad PF,36+ Mos 06/24/2013, 06/08/2014  . PPD Test 04/25/2016  . Pneumococcal Conjugate-13 01/06/2015  . Pneumococcal Polysaccharide-23 05/30/2008  . Tdap 06/20/2011    Allergies  Allergen Reactions  . Pioglitazone Other (See Comments)    Edema   . Rosiglitazone Maleate Other (See Comments)    edema    Medications: Patient's Medications  New Prescriptions   No medications on file  Previous Medications   CARVEDILOL (COREG) 12.5 MG TABLET    Take 1 tablet (12.5 mg total) by mouth 2 (two) times daily.   CLOPIDOGREL (PLAVIX) 75 MG TABLET    Take 1 tablet (75 mg total) by mouth daily.   DICLOFENAC SODIUM (VOLTAREN) 1 % GEL    Apply 4 g topically 4 (four) times daily.   DONEPEZIL (ARICEPT) 10 MG TABLET    Take 1 tablet (10 mg total) by mouth at bedtime.   HYPROMELLOSE (NATURAL BALANCE TEARS) 0.4 % SOLN    Apply 2 drops to eye 3 (three) times daily.    LISINOPRIL (PRINIVIL,ZESTRIL) 40 MG TABLET    Take 1 tablet (40 mg total) by mouth daily.   MEMANTINE (NAMENDA XR) 28 MG CP24 24 HR CAPSULE    Take 1 capsule (28 mg total) by mouth daily.   PHENAZOPYRIDINE (PYRIDIUM) 100 MG TABLET    Take 100 mg by mouth 3 (three) times daily as needed for pain.   POLYETHYL GLYCOL-PROPYL GLYCOL (SYSTANE) 0.4-0.3 % SOLN    Place 1 drop into both eyes 2 (two) times daily.   PREDNISOLONE ACETATE (PRED FORTE) 1 % OPHTHALMIC SUSPENSION  Place into the left eye 4 (four) times daily.   SERTRALINE (ZOLOFT) 25 MG TABLET    Take 1 tablet (25 mg total) by mouth daily.   SIMVASTATIN (ZOCOR) 20 MG TABLET    Take 1 tablet (20 mg total) by mouth at bedtime.   TAMSULOSIN (FLOMAX) 0.4 MG CAPS CAPSULE    Take 0.4 mg by mouth at bedtime.  Modified Medications   No medications on file  Discontinued Medications   ALUMINUM & MAGNESIUM HYDROXIDE (LIQUID ANTACID PO)    Take 30 mLs by mouth every 4 (four) hours as needed (for indegestion).     Review of Systems  Unable to perform ROS: Dementia    Vitals:   06/11/16 1324  BP: 134/83  Pulse: 62  Resp: 18  Temp: 97.2 F (36.2 C)  TempSrc: Oral  SpO2: 95%  Weight: 198 lb (89.8 kg)  Height: 5\' 7"  (1.702 m)   Body mass index is 31.01 kg/m.  Physical Exam  Constitutional: He appears well-developed and well-nourished.  Sitting in w/c in NAD  HENT:  Mouth/Throat: Oropharynx is clear and moist. No oropharyngeal exudate.  Eyes: Pupils are equal, round, and reactive to light. No scleral icterus.  Neck: Neck supple. Carotid bruit is not present.  Cardiovascular: Normal rate, regular rhythm and intact distal pulses.  Exam reveals no gallop and no friction rub.   Murmur (1/6 SEM) heard. +1 pitting LE edema b/l. No calf TTP  Pulmonary/Chest: Effort normal. No respiratory distress. He has no wheezes. He has rales (right basilar inspiratory rales. no left rales). He exhibits no tenderness.  No rhonchi  Abdominal: Soft. Bowel  sounds are normal. He exhibits no distension, no abdominal bruit, no pulsatile midline mass and no mass. There is no hepatomegaly. There is no tenderness. There is no rebound and no guarding.  Genitourinary:  Genitourinary Comments: Foley DTG dark amber appearing urine  Musculoskeletal: He exhibits edema.  Lymphadenopathy:    He has no cervical adenopathy.  Neurological: He is alert.  Skin: Skin is warm and dry. No rash noted.  Psychiatric: He has a normal mood and affect. His behavior is normal.     Labs reviewed: Office Visit on 05/24/2016  Component Date Value Ref Range Status  . Glucose 05/25/2016 339* 65 - 99 mg/dL Final  . BUN 05/25/2016 36* 8 - 27 mg/dL Final  . Creatinine, Ser 05/25/2016 1.42* 0.76 - 1.27 mg/dL Final  . GFR calc non Af Amer 05/25/2016 49* >59 mL/min/1.73 Final  . GFR calc Af Amer 05/25/2016 56* >59 mL/min/1.73 Final  . BUN/Creatinine Ratio 05/25/2016 25* 10 - 24 Final  . Sodium 05/25/2016 143  134 - 144 mmol/L Final  . Potassium 05/25/2016 3.5  3.5 - 5.2 mmol/L Final  . Chloride 05/25/2016 97  96 - 106 mmol/L Final  . CO2 05/25/2016 28  18 - 29 mmol/L Final  . Anion Gap 05/25/2016 18.0  10.0 - 18.0 mmol/L Final  . Calcium 05/25/2016 9.1  8.6 - 10.2 mg/dL Final  Office Visit on 04/25/2016  Component Date Value Ref Range Status  . Glucose 04/26/2016 202* 65 - 99 mg/dL Final  . BUN 04/26/2016 32* 8 - 27 mg/dL Final  . Creatinine, Ser 04/26/2016 1.33* 0.76 - 1.27 mg/dL Final  . GFR calc non Af Amer 04/26/2016 53* >59 mL/min/1.73 Final  . GFR calc Af Amer 04/26/2016 61  >59 mL/min/1.73 Final  . BUN/Creatinine Ratio 04/26/2016 24  10 - 24 Final  . Sodium 04/26/2016 149* 134 -  144 mmol/L Final  . Potassium 04/26/2016 3.9  3.5 - 5.2 mmol/L Final  . Chloride 04/26/2016 104  96 - 106 mmol/L Final  . CO2 04/26/2016 31* 18 - 29 mmol/L Final  . Anion Gap 04/26/2016 14.0  10.0 - 18.0 mmol/L Final  . Calcium 04/26/2016 9.5  8.6 - 10.2 mg/dL Final  . TB Skin Test  04/27/2016 Negative   Final  . Induration 04/27/2016 0  mm Final  Office Visit on 04/18/2016  Component Date Value Ref Range Status  . Glucose 04/19/2016 247* 65 - 99 mg/dL Final  . BUN 04/19/2016 30* 8 - 27 mg/dL Final  . Creatinine, Ser 04/19/2016 1.31* 0.76 - 1.27 mg/dL Final  . GFR calc non Af Amer 04/19/2016 54* >59 mL/min/1.73 Final  . GFR calc Af Amer 04/19/2016 62  >59 mL/min/1.73 Final  . BUN/Creatinine Ratio 04/19/2016 23  10 - 24 Final  . Sodium 04/19/2016 144  134 - 144 mmol/L Final  . Potassium 04/19/2016 4.0  3.5 - 5.2 mmol/L Final  . Chloride 04/19/2016 100  96 - 106 mmol/L Final  . CO2 04/19/2016 29  18 - 29 mmol/L Final  . Anion Gap 04/19/2016 15.0  10.0 - 18.0 mmol/L Final  . Calcium 04/19/2016 9.5  8.6 - 10.2 mg/dL Final    Dg Knee 2 Views Right  Result Date: 05/23/2016 CLINICAL DATA:  75 year old male post fall. Knee pain. Initial encounter. EXAM: RIGHT KNEE - 1-2 VIEW COMPARISON:  None. FINDINGS: Two-view examination right knee without evidence of fracture or dislocation. If fracture remained of high clinical concern, oblique views may be considered. Mild prominence at the patella region may represent soft tissue injury. 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 Small joint effusion. Vascular calcifications. On lateral view, there is either prominence of the popliteal artery with aneurysmal dilatation versus overlapping radiopaque structures. IMPRESSION: Two-view examination right knee without evidence of fracture or dislocation. If fracture remained of high clinical concern, oblique views may be considered. Mild prominence infrapatellar region may represent soft tissue injury. 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 Small joint effusion. Vascular calcifications. On lateral view, there is either prominence of the popliteal artery with aneurysmal dilatation versus overlapping radiopaque structures. Electronically Signed   By: Genia Del  M.D.   On: 05/23/2016 07:50   Ct Head Wo Contrast  Result Date: 05/23/2016 CLINICAL DATA:  The patient fell in the bathroom this morning with a blow to the head. Initial encounter. EXAM: CT HEAD WITHOUT CONTRAST TECHNIQUE: Contiguous axial images were obtained from the base of the skull through the vertex without intravenous contrast. COMPARISON:  Head CT scan 11/10/2015. FINDINGS: There is cortical atrophy and chronic microvascular ischemic change. Remote right basal ganglia lacunar infarct is identified. The patient is status post right craniotomy and right MCA aneurysm clipping as on the prior study. No evidence of acute abnormality including hemorrhage, infarct, mass lesion, mass effect, midline shift or abnormal extra-axial fluid collection. No hydrocephalus or pneumocephalus. No fracture. Imaged paranasal sinuses mastoid air cells are clear. IMPRESSION: No acute abnormality. Atrophy and chronic microvascular ischemic change. Status post right MCA aneurysm clipping. Electronically Signed   By: Inge Rise M.D.   On: 05/23/2016 07:29   Dg Shoulder Left  Result Date: 05/23/2016 CLINICAL DATA:  75 year old male. Fall. Soreness left shoulder. Initial encounter. EXAM: LEFT SHOULDER - 2+ VIEW COMPARISON:  11/10/2015 chest x-ray. FINDINGS: Degenerative changes glenohumeral joint and acromioclavicular joint without evidence of fracture or dislocation. Bones appear osteopenic. Visualized  lungs clear. IMPRESSION: Degenerative changes glenohumeral joint and acromioclavicular joint without evidence of fracture or dislocation. Electronically Signed   By: Genia Del M.D.   On: 05/23/2016 07:46   Dg Hand Complete Right  Result Date: 05/24/2016 CLINICAL DATA:  Right fourth finger pain status post fall. EXAM: RIGHT HAND - COMPLETE 3+ VIEW COMPARISON:  None. FINDINGS: There is no evidence of fracture or dislocation. There is diffusely decreased osseous mineralization. Multifocal osteoarthritic changes are seen,  worse at the radiocarpal joint. 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 There is soft tissue swelling of the second digit. Advanced calcific arterial atherosclerotic disease is seen. IMPRESSION: No acute fracture or dislocation identified about the right Hand. Osteopenia. Multifocal osteoarthritic changes. Calcific atherosclerosis. Electronically Signed   By: Fidela Salisbury M.D.   On: 05/24/2016 14:45     Assessment/Plan   ICD-9-CM ICD-10-CM   1. Abnormal urinalysis 791.9 R82.90   2. Vascular dementia, without behavioral disturbance 290.40 F01.50   3. Controlled type 2 diabetes mellitus without complication, without long-term current use of insulin (HCC) 250.00 E11.9   4. 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 Essential hypertension 401.9 I10   6. Adjustment disorder with mixed anxiety and depressed mood 309.28 F43.23   7. Hyperlipidemia associated with type 2 diabetes mellitus (HCC) 250.80 E11.69    272.4 E78.5   8. Neurogenic bladder - s/p foley cath 596.54 N31.9     Check UA and tx if (+)  Will need lipid panel, A1c, urine micro/Cr ratio  Finish acyclovir - wife reports improving   Cont other meds as ordered  Foley cath care as indicated  PT/OT/ST as ordered  Fall precautions  Nutritional supplements as indicated  GOAL: short term rehab then long term care. Communicated with pt and nursing.  Will follow  Jahari Wiginton S. Perlie Gold  [facility] [address] [place], [place] [address] [phone] Cell (Monday-Friday 8 AM - 5 PM) [phone] After 5 PM and follow prompts

## 2016-06-15 DIAGNOSIS — F329 Major depressive disorder, single episode, unspecified: Secondary | ICD-10-CM | POA: Diagnosis not present

## 2016-06-15 DIAGNOSIS — R531 Weakness: Secondary | ICD-10-CM | POA: Diagnosis not present

## 2016-06-15 DIAGNOSIS — I10 Essential (primary) hypertension: Secondary | ICD-10-CM | POA: Diagnosis not present

## 2016-06-15 DIAGNOSIS — N319 Neuromuscular dysfunction of bladder, unspecified: Secondary | ICD-10-CM | POA: Diagnosis not present

## 2016-06-15 DIAGNOSIS — E119 Type 2 diabetes mellitus without complications: Secondary | ICD-10-CM | POA: Diagnosis not present

## 2016-06-15 DIAGNOSIS — G4733 Obstructive sleep apnea (adult) (pediatric): Secondary | ICD-10-CM | POA: Diagnosis not present

## 2016-06-15 DIAGNOSIS — I639 Cerebral infarction, unspecified: Secondary | ICD-10-CM | POA: Diagnosis not present

## 2016-06-15 DIAGNOSIS — F0150 Vascular dementia without behavioral disturbance: Secondary | ICD-10-CM | POA: Diagnosis not present

## 2016-06-22 DIAGNOSIS — I639 Cerebral infarction, unspecified: Secondary | ICD-10-CM | POA: Diagnosis not present

## 2016-07-02 DIAGNOSIS — N390 Urinary tract infection, site not specified: Secondary | ICD-10-CM | POA: Diagnosis not present

## 2016-07-02 DIAGNOSIS — E119 Type 2 diabetes mellitus without complications: Secondary | ICD-10-CM | POA: Diagnosis not present

## 2016-07-02 DIAGNOSIS — R531 Weakness: Secondary | ICD-10-CM | POA: Diagnosis not present

## 2016-07-02 DIAGNOSIS — R319 Hematuria, unspecified: Secondary | ICD-10-CM | POA: Diagnosis not present

## 2016-07-04 ENCOUNTER — Encounter (INDEPENDENT_AMBULATORY_CARE_PROVIDER_SITE_OTHER): Payer: PPO | Admitting: Ophthalmology

## 2016-07-04 DIAGNOSIS — H43813 Vitreous degeneration, bilateral: Secondary | ICD-10-CM | POA: Diagnosis not present

## 2016-07-04 DIAGNOSIS — H59032 Cystoid macular edema following cataract surgery, left eye: Secondary | ICD-10-CM

## 2016-07-04 DIAGNOSIS — H35372 Puckering of macula, left eye: Secondary | ICD-10-CM | POA: Diagnosis not present

## 2016-07-04 DIAGNOSIS — I10 Essential (primary) hypertension: Secondary | ICD-10-CM

## 2016-07-04 DIAGNOSIS — E11319 Type 2 diabetes mellitus with unspecified diabetic retinopathy without macular edema: Secondary | ICD-10-CM

## 2016-07-04 DIAGNOSIS — H35033 Hypertensive retinopathy, bilateral: Secondary | ICD-10-CM

## 2016-07-04 DIAGNOSIS — E113293 Type 2 diabetes mellitus with mild nonproliferative diabetic retinopathy without macular edema, bilateral: Secondary | ICD-10-CM | POA: Diagnosis not present

## 2016-07-09 DIAGNOSIS — E1151 Type 2 diabetes mellitus with diabetic peripheral angiopathy without gangrene: Secondary | ICD-10-CM | POA: Diagnosis not present

## 2016-07-09 DIAGNOSIS — L603 Nail dystrophy: Secondary | ICD-10-CM | POA: Diagnosis not present

## 2016-07-09 DIAGNOSIS — L84 Corns and callosities: Secondary | ICD-10-CM | POA: Diagnosis not present

## 2016-07-09 DIAGNOSIS — I739 Peripheral vascular disease, unspecified: Secondary | ICD-10-CM | POA: Diagnosis not present

## 2016-07-12 ENCOUNTER — Non-Acute Institutional Stay (SKILLED_NURSING_FACILITY): Payer: PPO | Admitting: Internal Medicine

## 2016-07-12 ENCOUNTER — Encounter: Payer: Self-pay | Admitting: Internal Medicine

## 2016-07-12 DIAGNOSIS — F4323 Adjustment disorder with mixed anxiety and depressed mood: Secondary | ICD-10-CM

## 2016-07-12 DIAGNOSIS — R600 Localized edema: Secondary | ICD-10-CM

## 2016-07-12 DIAGNOSIS — I10 Essential (primary) hypertension: Secondary | ICD-10-CM

## 2016-07-12 DIAGNOSIS — E785 Hyperlipidemia, unspecified: Secondary | ICD-10-CM

## 2016-07-12 DIAGNOSIS — F0150 Vascular dementia without behavioral disturbance: Secondary | ICD-10-CM

## 2016-07-12 DIAGNOSIS — N4889 Other specified disorders of penis: Secondary | ICD-10-CM | POA: Diagnosis not present

## 2016-07-12 DIAGNOSIS — E1169 Type 2 diabetes mellitus with other specified complication: Secondary | ICD-10-CM | POA: Diagnosis not present

## 2016-07-12 DIAGNOSIS — N489 Disorder of penis, unspecified: Secondary | ICD-10-CM

## 2016-07-12 DIAGNOSIS — E11319 Type 2 diabetes mellitus with unspecified diabetic retinopathy without macular edema: Secondary | ICD-10-CM

## 2016-07-12 NOTE — Progress Notes (Signed)
Patient ID: [name], male   DOB: 02-26-1941, 75 y.o.   MRN: [id number]    DATE:  9.28.2017  Location:    [facility] Room Number: [id number] A Place of Service: SNF (31)   Extended Emergency Contact Information Primary Emergency Contact: Delia,[name] Address: [address]          [place] [address] [place] Phone: [phone] Relation: Spouse Secondary Emergency Contact: Krupinski,[name]  [place] Relation: Daughter  Advanced Directive information Does patient have an advance directive?: Yes, Type of Advance Directive: Out of facility DNR (pink MOST or yellow form), Does patient want to make changes to advanced directive?: No - Patient declined  Chief Complaint  Patient presents with  . Medical Management of Chronic Issues    Routine Visit    HPI:  75 yo male long term resident seen today for f/u. He c/o burning in OS after he gets eye gtts.  No other concerns. Per nursing, penile lesion resolved. No falls. Appetite ok.sleeps well. He is a poor historian due to dementia. Hx obtained from chart.  Chronic LE edema with hx venous insufficiency - stable on   HTN - BP stable on coreg and lisinopril.   DM - diet controlled. CBG 117 today. A1c 6% in Oct 2016. Urine micro/Cr ratio 4.3 in 12/2014. Last eye exam per chart, was in mid 2016. Diabetic changes noted and he uses eye gtts daily  Hyperlipidemia - stable on zocor. No LDL since 2015 (46 at the time)  Depression/adjustment d/o - mood stable on zoloft  Dementia - stable on aricept/namenda. He has hx frequent falls  Hx CVA/TIA - stable on plavix. 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 He has left sided paresthesias  Chronic bladder pain/hx neurogenic bladder - has chronic foley cath. Currently on pyridium and flomax. Followed by urology  Joint pain/arthritis - stable on diclofenac. He has gait dysfunction  Hx anemia - hgb 10.8  CKD  - Cr 1.1   Past Medical History:  Diagnosis Date  . Arthritis    "maybe"  . 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 CHF (congestive heart failure) ([place])   . Depression   . Hyperlipemia   . Hypertension   . Sleep apnea    "my machine [place]; got to get me another one" (09/01/2014)  . Stroke [facility]) 2009   left thalamic infarct, residual LUE weakness  . TIA (transient ischemic attack)    multiple in past, most recently in 2011  . Type II diabetes mellitus ([place])     Past Surgical History:  Procedure Laterality Date  . CARDIAC CATHETERIZATION  07/2010  . CRANIOTOMY  05/2008   for clipping of an incidental asymptomatic unruptured/notes 02/13/2011  . KNEE ARTHROSCOPY Left   . 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 SHOULDER ARTHROSCOPY W/ ROTATOR CUFF REPAIR Right     Patient Care Team: Aldine Contes, MD as PCP - General (Internal Medicine) Webb Laws, OD as Referring Physician (Optometry) Bjorn Loser, MD as Consulting Physician (Urology) Hayden Pedro, MD as Consulting Physician (Ophthalmology) Charlsie Merles, MD as Referring Physician (Internal Medicine)  Social History   Social History  . Marital status: Married    Spouse name: [name]  . Number of children: 3  . Years of education: HS   Occupational  History  . disabled    Social History Main Topics  . Smoking status: Former Smoker    Packs/day: 1.50    Years: 15.00    Types: Cigarettes    Quit date: 10/16/1983  . Smokeless tobacco: Never Used  . Alcohol use No  . Drug use: No  . Sexual activity: No   Other Topics Concern  . Not on file   Social History Narrative   Pt lives at home with spouse.   Caffeine Use: 1 cup daily     reports that he quit smoking about 32 years ago. His smoking use included Cigarettes. He has a 22.50 pack-year smoking history. He has never used  smokeless tobacco. He reports that he does not drink alcohol or use drugs.  Family History  Problem Relation Age of Onset  . Stroke Mother   . Pneumonia Father   . Diabetes Sister   . Diabetes Brother   . Diabetes Paternal Aunt   . Colon cancer Neg Hx    Family Status  Relation Status  . Mother Deceased at age 21  . Father Deceased at age 40  . Sister   . Brother   . Paternal Aunt   . Neg Hx     Immunization History  Administered Date(s) Administered  . Influenza Split 06/25/2012  . Influenza Whole 07/18/2007, 08/18/2008, 08/09/2009, 06/14/2010, 06/20/2011  . Influenza,inj,Quad PF,36+ Mos 06/24/2013, 06/08/2014  . PPD Test 04/25/2016, 06/29/2016, 07/06/2016  . Pneumococcal Conjugate-13 01/06/2015  . Pneumococcal Polysaccharide-23 05/30/2008  . Tdap 06/20/2011    Allergies  Allergen Reactions  . Pioglitazone Other (See Comments)    Edema   . Rosiglitazone Maleate Other (See Comments)    edema    Medications: Patient's Medications  New Prescriptions   No medications on file  Previous Medications   CARVEDILOL (COREG) 12.5 MG TABLET    Take 1 tablet (12.5 mg total) by mouth 2 (two) times daily.   CLOPIDOGREL (PLAVIX) 75 MG TABLET    Take 1 tablet (75 mg total) by mouth daily.   DICLOFENAC SODIUM (VOLTAREN) 1 % GEL    Apply 4 g topically 4 (four) times daily.   DONEPEZIL (ARICEPT) 10 MG TABLET    Take 1 tablet (10 mg total) by mouth at bedtime.   HYPROMELLOSE (NATURAL BALANCE TEARS) 0.4 % SOLN    Apply 2 drops to eye 3 (three) times daily.   INSULIN ASPART (NOVOLOG) 100 UNIT/ML INJECTION    Inject into the skin 3 (three) times daily before meals. Inject as per sliding scale: 0-59=0 call MD if less than 60, 60-150=3 units 301-400=5 units call MD if >400   LISINOPRIL (PRINIVIL,ZESTRIL) 40 MG TABLET    Take 1 tablet (40 mg total) by mouth daily.   MEMANTINE (NAMENDA XR) 28 MG CP24 24 HR CAPSULE    Take 1 capsule (28 mg total) by mouth daily.   NEPAFENAC (NEVANAC) 0.1 %  OPHTHALMIC SUSPENSION    Place 1 drop into the left eye daily.   OXYBUTYNIN (DITROPAN) 5 MG TABLET    Take 5 mg by mouth 2 (two) times daily.   PHENAZOPYRIDINE (PYRIDIUM) 100 MG TABLET    Take 100 mg by mouth 3 (three) times daily as needed for pain.   POLYETHYL GLYCOL-PROPYL GLYCOL (SYSTANE) 0.4-0.3 % SOLN    Place 1 drop into both eyes 2 (two) times daily.   PREDNISOLONE ACETATE (PRED FORTE) 1 % OPHTHALMIC SUSPENSION    Place into the left eye 4 (four) times  daily.   SERTRALINE (ZOLOFT) 25 MG TABLET    Take 1 tablet (25 mg total) by mouth daily.   SIMVASTATIN (ZOCOR) 20 MG TABLET    Take 1 tablet (20 mg total) by mouth at bedtime.   TAMSULOSIN (FLOMAX) 0.4 MG CAPS CAPSULE    Take 0.4 mg by mouth at bedtime.  Modified Medications   No medications on file  Discontinued Medications   No medications on file    Review of Systems  Unable to perform ROS: Dementia    Vitals:   07/12/16 1215  BP: (!) 142/74  Pulse: 77  Resp: 18  Temp: 97.1 F (36.2 C)  TempSrc: Oral  SpO2: 96%  Weight: 188 lb 3.2 oz (85.4 kg)  Height: 5\' 7"  (1.702 m)   Body mass index is 29.48 kg/m.  Physical Exam  Constitutional: He appears well-developed.  Sitting in w/c in NAD, frail appearing  HENT:  Mouth/Throat: Oropharynx is clear and moist. No oropharyngeal exudate.  Eyes: Pupils are equal, round, and reactive to light. No scleral icterus.  Neck: Neck supple. Carotid bruit is not present.  Cardiovascular: Normal rate, regular rhythm and intact distal pulses.  Exam reveals no gallop and no friction rub.   Murmur (1/6 SEM) heard. +1 pitting LE edema L>R. No calf TTP  Pulmonary/Chest: Effort normal. No respiratory distress. He has no wheezes. He has no rales. He exhibits no tenderness.  No rhonchi  Abdominal: Soft. Bowel sounds are normal. He exhibits no distension, no abdominal bruit, no pulsatile midline mass and no mass. There is no hepatomegaly. There is no tenderness. There is no rebound and no  guarding.  Genitourinary:  Genitourinary Comments: Foley DTG dark amber appearing urine  Musculoskeletal: He exhibits edema.  Lymphadenopathy:    He has no cervical adenopathy.  Neurological: He is alert.  Left hemiparesis with increased swelling  In LUE and LLE  Skin: Skin is warm and dry. No rash noted.  Psychiatric: He has a normal mood and affect. His behavior is normal.     Labs reviewed: Nursing Home on 07/12/2016  Component Date Value Ref Range Status  . Hemoglobin 06/09/2016 10.8* 13.5 - 17.5 g/dL Final  . HCT 06/09/2016 34* 41 - 53 % Final  . Platelets 06/09/2016 313  150 - 399 K/L Final  . WBC 06/09/2016 8.2  10^3/mL Final  . Glucose 06/09/2016 227  mg/dL Final  . BUN 06/09/2016 20  4 - 21 mg/dL Final  . Creatinine 06/09/2016 1.1  0.6 - 1.3 mg/dL Final  . Potassium 06/09/2016 4.2  3.4 - 5.3 mmol/L Final  . Sodium 06/09/2016 148* 137 - 147 mmol/L Final  . Alkaline Phosphatase 06/09/2016 81  25 - 125 U/L Final  . ALT 06/09/2016 20  10 - 40 U/L Final  . AST 06/09/2016 12* 14 - 40 U/L Final  . Bilirubin, Total 06/09/2016 0.3  mg/dL Final  Office Visit on 05/24/2016  Component Date Value Ref Range Status  . Glucose 05/25/2016 339* 65 - 99 mg/dL Final  . BUN 05/25/2016 36* 8 - 27 mg/dL Final  . Creatinine, Ser 05/25/2016 1.42* 0.76 - 1.27 mg/dL Final  . GFR calc non Af Amer 05/25/2016 49* >59 mL/min/1.73 Final  . GFR calc Af Amer 05/25/2016 56* >59 mL/min/1.73 Final  . BUN/Creatinine Ratio 05/25/2016 25* 10 - 24 Final  . Sodium 05/25/2016 143  134 - 144 mmol/L Final  . Potassium 05/25/2016 3.5  3.5 - 5.2 mmol/L Final  . Chloride 05/25/2016 97  96 - 106 mmol/L Final  . CO2 05/25/2016 28  18 - 29 mmol/L Final  . Anion Gap 05/25/2016 18.0  10.0 - 18.0 mmol/L Final  . Calcium 05/25/2016 9.1  8.6 - 10.2 mg/dL Final  Office Visit on 04/25/2016  Component Date Value Ref Range Status  . Glucose 04/26/2016 202* 65 - 99 mg/dL Final  . BUN 04/26/2016 32* 8 - 27 mg/dL Final  .  Creatinine, Ser 04/26/2016 1.33* 0.76 - 1.27 mg/dL Final  . GFR calc non Af Amer 04/26/2016 53* >59 mL/min/1.73 Final  . GFR calc Af Amer 04/26/2016 61  >59 mL/min/1.73 Final  . BUN/Creatinine Ratio 04/26/2016 24  10 - 24 Final  . Sodium 04/26/2016 149* 134 - 144 mmol/L Final  . Potassium 04/26/2016 3.9  3.5 - 5.2 mmol/L Final  . Chloride 04/26/2016 104  96 - 106 mmol/L Final  . CO2 04/26/2016 31* 18 - 29 mmol/L Final  . Anion Gap 04/26/2016 14.0  10.0 - 18.0 mmol/L Final  . Calcium 04/26/2016 9.5  8.6 - 10.2 mg/dL Final  . TB Skin Test 04/27/2016 Negative   Final  . Induration 04/27/2016 0  mm Final  Office Visit on 04/18/2016  Component Date Value Ref Range Status  . Glucose 04/19/2016 247* 65 - 99 mg/dL Final  . BUN 04/19/2016 30* 8 - 27 mg/dL Final  . Creatinine, Ser 04/19/2016 1.31* 0.76 - 1.27 mg/dL Final  . GFR calc non Af Amer 04/19/2016 54* >59 mL/min/1.73 Final  . GFR calc Af Amer 04/19/2016 62  >59 mL/min/1.73 Final  . BUN/Creatinine Ratio 04/19/2016 23  10 - 24 Final  . Sodium 04/19/2016 144  134 - 144 mmol/L Final  . Potassium 04/19/2016 4.0  3.5 - 5.2 mmol/L Final  . Chloride 04/19/2016 100  96 - 106 mmol/L Final  . CO2 04/19/2016 29  18 - 29 mmol/L Final  . Anion Gap 04/19/2016 15.0  10.0 - 18.0 mmol/L Final  . Calcium 04/19/2016 9.5  8.6 - 10.2 mg/dL Final    No results found.   Assessment/Plan   ICD-9-CM ICD-10-CM   1. Vascular dementia, without behavioral disturbance 290.40 F01.50   2. Essential hypertension 401.9 I10   3. 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 Penile lesion 607.89 N48.89    resolved  5. Adjustment disorder with mixed anxiety and depressed mood 309.28 F43.23   6. Hyperlipidemia associated with type 2 diabetes mellitus (HCC) 250.80 E11.69    272.4 E78.5   7. Bilateral lower  extremity edema 782.3 R60.0      Cont current meds as ordered  PT/OT/St as ordered  Nutritional supplements as ordered  Fall precautions  Cont CBGs daily  Will follow  Nelissa Bolduc S. Perlie Gold  [facility] [address] [place], [place] [address] [phone] Cell (Monday-Friday 8 AM - 5 PM) [phone] After 5 PM and follow prompts

## 2016-07-14 DIAGNOSIS — N319 Neuromuscular dysfunction of bladder, unspecified: Secondary | ICD-10-CM | POA: Insufficient documentation

## 2016-07-15 DIAGNOSIS — I639 Cerebral infarction, unspecified: Secondary | ICD-10-CM | POA: Diagnosis not present

## 2016-07-26 DIAGNOSIS — N3941 Urge incontinence: Secondary | ICD-10-CM | POA: Diagnosis not present

## 2016-07-26 DIAGNOSIS — N319 Neuromuscular dysfunction of bladder, unspecified: Secondary | ICD-10-CM | POA: Diagnosis not present

## 2016-08-08 ENCOUNTER — Non-Acute Institutional Stay (SKILLED_NURSING_FACILITY): Payer: PPO | Admitting: Adult Health

## 2016-08-08 ENCOUNTER — Encounter: Payer: Self-pay | Admitting: Adult Health

## 2016-08-08 DIAGNOSIS — E785 Hyperlipidemia, unspecified: Secondary | ICD-10-CM | POA: Diagnosis not present

## 2016-08-08 DIAGNOSIS — G8192 Hemiplegia, unspecified affecting left dominant side: Secondary | ICD-10-CM

## 2016-08-08 DIAGNOSIS — I639 Cerebral infarction, unspecified: Secondary | ICD-10-CM

## 2016-08-08 DIAGNOSIS — F0150 Vascular dementia without behavioral disturbance: Secondary | ICD-10-CM

## 2016-08-08 DIAGNOSIS — I6350 Cerebral infarction due to unspecified occlusion or stenosis of unspecified cerebral artery: Secondary | ICD-10-CM | POA: Diagnosis not present

## 2016-08-08 DIAGNOSIS — IMO0002 Reserved for concepts with insufficient information to code with codable children: Secondary | ICD-10-CM

## 2016-08-08 DIAGNOSIS — E1169 Type 2 diabetes mellitus with other specified complication: Secondary | ICD-10-CM | POA: Diagnosis not present

## 2016-08-08 DIAGNOSIS — F4323 Adjustment disorder with mixed anxiety and depressed mood: Secondary | ICD-10-CM

## 2016-08-08 DIAGNOSIS — I10 Essential (primary) hypertension: Secondary | ICD-10-CM

## 2016-08-08 NOTE — Progress Notes (Signed)
Patient ID: [name], male   DOB: July 18, 1941, 75 y.o.   MRN: [id number]   Location:   [facility] Room Number: 217-A Place of Service:  SNF (31)   CODE STATUS: DNR  Allergies  Allergen Reactions  . Pioglitazone Other (See Comments)    Edema   . Rosiglitazone Maleate Other (See Comments)    edema    Chief Complaint  Patient presents with  . Medical Management of Chronic Issues    Follow up    HPI:  He is a resident of this facility being seen for the management of his chronic illnesses. Overall his status is stable. He is continuing to work with therapy. He is unable to fully participate in the hpi but tell me that he is feeling good. There are no nursing concerns at this time.    Past Medical History:  Diagnosis Date  . Arthritis    "maybe"  . 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 CHF (congestive heart failure) ([place])   . Depression   . Hyperlipemia   . Hypertension   . Sleep apnea    "my machine [place]; got to get me another one" (09/01/2014)  . Stroke [facility]) 2009   left thalamic infarct, residual LUE weakness  . TIA (transient ischemic attack)    multiple in past, most recently in 2011  . Type II diabetes mellitus ([place])     Past Surgical History:  Procedure Laterality Date  . CARDIAC CATHETERIZATION  07/2010  . CRANIOTOMY  05/2008   for clipping of an incidental asymptomatic unruptured/notes 02/13/2011  . KNEE ARTHROSCOPY Left   . 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 SHOULDER ARTHROSCOPY W/ ROTATOR CUFF REPAIR Right     Social History   Social History  . Marital status: Married    Spouse name: [name]  . Number of children: 3  . Years of education: HS   Occupational History  . disabled    Social History Main Topics  . Smoking status: Former Smoker    Packs/day: 1.50    Years: 15.00    Types: Cigarettes   Quit date: 10/16/1983  . Smokeless tobacco: Never Used  . Alcohol use No  . Drug use: No  . Sexual activity: No   Other Topics Concern  . Not on file   Social History Narrative   Pt lives at home with spouse.   Caffeine Use: 1 cup daily   Family History  Problem Relation Age of Onset  . Stroke Mother   . Pneumonia Father   . Diabetes Sister   . Diabetes Brother   . Diabetes Paternal Aunt   . Colon cancer Neg Hx       VITAL SIGNS BP (!) 158/74   Pulse 75   Temp 98 F (36.7 C) (Oral)   Resp 16   Ht 5\' 7"  (1.702 m)   Wt 186 lb 2 oz (84.4 kg)   SpO2 97%   BMI 29.15 kg/m   Patient's Medications  New Prescriptions   No medications on file  Previous Medications   CARVEDILOL (COREG) 12.5 MG TABLET    Take 1 tablet (12.5 mg total) by mouth 2 (two) times daily.   CLOPIDOGREL (PLAVIX) 75 MG TABLET    Take 1 tablet (75 mg total) by mouth daily.   DICLOFENAC SODIUM (VOLTAREN) 1 % GEL  Apply 4 g topically 4 (four) times daily.   DONEPEZIL (ARICEPT) 10 MG TABLET    Take 1 tablet (10 mg total) by mouth at bedtime.   HYPROMELLOSE (NATURAL BALANCE TEARS) 0.4 % SOLN    Apply 2 drops to eye 3 (three) times daily.   INSULIN ASPART (NOVOLOG) 100 UNIT/ML INJECTION    Inject into the skin 3 (three) times daily before meals. Inject as per sliding scale: 0-59=0 call MD if less than 60, 60-150=3 units 301-400=5 units call MD if >400   LISINOPRIL (PRINIVIL,ZESTRIL) 40 MG TABLET    Take 1 tablet (40 mg total) by mouth daily.   MEMANTINE (NAMENDA XR) 28 MG CP24 24 HR CAPSULE    Take 1 capsule (28 mg total) by mouth daily.   MIRABEGRON ER (MYRBETRIQ) 50 MG TB24 TABLET    Take 50 mg by mouth daily.   OXYBUTYNIN (DITROPAN) 5 MG TABLET    Take 5 mg by mouth 2 (two) times daily.   PHENAZOPYRIDINE (PYRIDIUM) 100 MG TABLET    Take 100 mg by mouth 3 (three) times daily as needed for pain.   POLYETHYL GLYCOL-PROPYL GLYCOL (SYSTANE) 0.4-0.3 % SOLN    Place 1 drop into both eyes 2 (two) times daily.    PREDNISOLONE ACETATE (PRED FORTE) 1 % OPHTHALMIC SUSPENSION    Place into the left eye 4 (four) times daily.   SERTRALINE (ZOLOFT) 25 MG TABLET    Take 1 tablet (25 mg total) by mouth daily.   SIMVASTATIN (ZOCOR) 20 MG TABLET    Take 1 tablet (20 mg total) by mouth at bedtime.   TAMSULOSIN (FLOMAX) 0.4 MG CAPS CAPSULE    Take 0.4 mg by mouth at bedtime.  Modified Medications   No medications on file  Discontinued Medications   No medications on file     SIGNIFICANT DIAGNOSTIC EXAMS  LABS REVIEWED:  06-09-16; wbc 8.2; hgb 10.8; hct 34.1 mcv 78.9 ;plt 313; glucose 227; bun 20.4; creat 1.08;k+ 4.2; na++ 148; liver normal albumin 3.2 06-11-16: urine culture: e-coli: levaquin 07-02-16: urine culture: mixed flora    Review of Systems  Unable to perform ROS: Dementia    Physical Exam  Constitutional: No distress.  Eyes: Conjunctivae are normal.  Neck: Neck supple. No JVD present. No thyromegaly present.  Cardiovascular: Normal rate, regular rhythm and intact distal pulses.   Murmur heard. Respiratory: Effort normal and breath sounds normal. No respiratory distress. He has no wheezes.  GI: Soft. Bowel sounds are normal. He exhibits no distension. There is no tenderness.  Genitourinary:  Genitourinary Comments: Has foley   Musculoskeletal: He exhibits edema.  Left hemiparesis Bilateral pitting lower extremity edema L>R  Lymphadenopathy:    He has no cervical adenopathy.  Neurological: He is alert.  Skin: Skin is warm and dry. He is not diaphoretic.  Psychiatric: He has a normal mood and affect.    ASSESSMENT/ PLAN:  1. 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 Hypertension: will continue coreg 12.5 mg twice daily and lisinopril 40 mg daily   3. Diabetes: will continue novolog SSI: 60-150=3units; 151-400=5 units   4. Vascular dementia: will continue aricept 10 mg nightly and namdena xr 28 mg daily   5. Dyslipidemia: will continue  zocor 20 mg daily   6. Urine retention: requires foley; will continue flomax 0.4 mg daily ditropan 5 mg twice daily and myrbetriq 50 mg daily   7. Adjustment disorder: will continue zoloft 25 mg daily  Ok Edwards NP [facility]  Contact [phone] Monday through Friday 8am- 5pm  After hours call [phone]

## 2016-08-16 DIAGNOSIS — N390 Urinary tract infection, site not specified: Secondary | ICD-10-CM | POA: Diagnosis not present

## 2016-08-16 DIAGNOSIS — L03115 Cellulitis of right lower limb: Secondary | ICD-10-CM | POA: Diagnosis not present

## 2016-08-16 DIAGNOSIS — T8130XA Disruption of wound, unspecified, initial encounter: Secondary | ICD-10-CM | POA: Diagnosis not present

## 2016-08-20 DIAGNOSIS — R531 Weakness: Secondary | ICD-10-CM | POA: Diagnosis not present

## 2016-08-20 DIAGNOSIS — E119 Type 2 diabetes mellitus without complications: Secondary | ICD-10-CM | POA: Diagnosis not present

## 2016-08-23 DIAGNOSIS — L03115 Cellulitis of right lower limb: Secondary | ICD-10-CM | POA: Diagnosis not present

## 2016-08-27 DIAGNOSIS — Z79899 Other long term (current) drug therapy: Secondary | ICD-10-CM | POA: Diagnosis not present

## 2016-08-27 LAB — BASIC METABOLIC PANEL
BUN: 16 mg/dL (ref 4–21)
CREATININE: 1.1 mg/dL (ref 0.6–1.3)
Glucose: 205 mg/dL
POTASSIUM: 3.9 mmol/L (ref 3.4–5.3)
Sodium: 142 mmol/L (ref 137–147)

## 2016-08-28 ENCOUNTER — Encounter (HOSPITAL_COMMUNITY): Payer: Self-pay | Admitting: Emergency Medicine

## 2016-08-28 ENCOUNTER — Encounter: Payer: Self-pay | Admitting: Adult Health

## 2016-08-28 ENCOUNTER — Non-Acute Institutional Stay (SKILLED_NURSING_FACILITY): Payer: PPO | Admitting: Adult Health

## 2016-08-28 ENCOUNTER — Emergency Department (HOSPITAL_COMMUNITY): Payer: PPO

## 2016-08-28 ENCOUNTER — Observation Stay (HOSPITAL_COMMUNITY)
Admission: EM | Admit: 2016-08-28 | Discharge: 2016-08-29 | Disposition: A | Discharge status: Skilled Nursing Facility | Payer: PPO | Attending: Internal Medicine | Admitting: Internal Medicine

## 2016-08-28 DIAGNOSIS — I509 Heart failure, unspecified: Secondary | ICD-10-CM | POA: Diagnosis not present

## 2016-08-28 DIAGNOSIS — Z8673 Personal history of transient ischemic attack (TIA), and cerebral infarction without residual deficits: Secondary | ICD-10-CM | POA: Diagnosis not present

## 2016-08-28 DIAGNOSIS — Z66 Do not resuscitate: Secondary | ICD-10-CM | POA: Diagnosis not present

## 2016-08-28 DIAGNOSIS — Z794 Long term (current) use of insulin: Secondary | ICD-10-CM | POA: Diagnosis not present

## 2016-08-28 DIAGNOSIS — G4730 Sleep apnea, unspecified: Secondary | ICD-10-CM | POA: Diagnosis not present

## 2016-08-28 DIAGNOSIS — L97118 Non-pressure chronic ulcer of right thigh with other specified severity: Secondary | ICD-10-CM | POA: Insufficient documentation

## 2016-08-28 DIAGNOSIS — R509 Fever, unspecified: Secondary | ICD-10-CM | POA: Diagnosis not present

## 2016-08-28 DIAGNOSIS — E785 Hyperlipidemia, unspecified: Secondary | ICD-10-CM | POA: Diagnosis not present

## 2016-08-28 DIAGNOSIS — R05 Cough: Secondary | ICD-10-CM | POA: Diagnosis not present

## 2016-08-28 DIAGNOSIS — S3733XA Laceration of urethra, initial encounter: Secondary | ICD-10-CM | POA: Diagnosis not present

## 2016-08-28 DIAGNOSIS — L0889 Other specified local infections of the skin and subcutaneous tissue: Secondary | ICD-10-CM | POA: Insufficient documentation

## 2016-08-28 DIAGNOSIS — Z87891 Personal history of nicotine dependence: Secondary | ICD-10-CM | POA: Diagnosis not present

## 2016-08-28 DIAGNOSIS — R609 Edema, unspecified: Secondary | ICD-10-CM | POA: Diagnosis present

## 2016-08-28 DIAGNOSIS — N319 Neuromuscular dysfunction of bladder, unspecified: Principal | ICD-10-CM | POA: Insufficient documentation

## 2016-08-28 DIAGNOSIS — R059 Cough, unspecified: Secondary | ICD-10-CM

## 2016-08-28 DIAGNOSIS — B9562 Methicillin resistant Staphylococcus aureus infection as the cause of diseases classified elsewhere: Secondary | ICD-10-CM | POA: Insufficient documentation

## 2016-08-28 DIAGNOSIS — R339 Retention of urine, unspecified: Secondary | ICD-10-CM | POA: Diagnosis not present

## 2016-08-28 DIAGNOSIS — F0150 Vascular dementia without behavioral disturbance: Secondary | ICD-10-CM | POA: Diagnosis present

## 2016-08-28 DIAGNOSIS — I110 Hypertensive heart disease with heart failure: Secondary | ICD-10-CM | POA: Diagnosis not present

## 2016-08-28 DIAGNOSIS — R0602 Shortness of breath: Secondary | ICD-10-CM | POA: Diagnosis not present

## 2016-08-28 DIAGNOSIS — R531 Weakness: Secondary | ICD-10-CM | POA: Diagnosis not present

## 2016-08-28 DIAGNOSIS — E119 Type 2 diabetes mellitus without complications: Secondary | ICD-10-CM | POA: Diagnosis not present

## 2016-08-28 DIAGNOSIS — I10 Essential (primary) hypertension: Secondary | ICD-10-CM | POA: Diagnosis present

## 2016-08-28 DIAGNOSIS — J189 Pneumonia, unspecified organism: Secondary | ICD-10-CM

## 2016-08-28 DIAGNOSIS — N289 Disorder of kidney and ureter, unspecified: Secondary | ICD-10-CM | POA: Diagnosis not present

## 2016-08-28 DIAGNOSIS — X58XXXA Exposure to other specified factors, initial encounter: Secondary | ICD-10-CM | POA: Diagnosis not present

## 2016-08-28 DIAGNOSIS — E1149 Type 2 diabetes mellitus with other diabetic neurological complication: Secondary | ICD-10-CM

## 2016-08-28 DIAGNOSIS — IMO0002 Reserved for concepts with insufficient information to code with codable children: Secondary | ICD-10-CM

## 2016-08-28 DIAGNOSIS — N179 Acute kidney failure, unspecified: Secondary | ICD-10-CM | POA: Diagnosis not present

## 2016-08-28 DIAGNOSIS — E1169 Type 2 diabetes mellitus with other specified complication: Secondary | ICD-10-CM | POA: Diagnosis present

## 2016-08-28 LAB — CBG MONITORING, ED: GLUCOSE-CAPILLARY: 290 mg/dL — AB (ref 65–99)

## 2016-08-28 NOTE — ED Notes (Signed)
EDP notified about bladder scan volume of 964mL. Remove foley and place new one, per verbal order Dr. Vanita Panda.

## 2016-08-28 NOTE — ED Triage Notes (Signed)
Per EMS: Ptis from [facility] facility. Pt has had urinary retention today and a catheter was attempted at the nursing facility. Personnel at the facility met resistance when inserting catheter and kept going. Pt has not had any urine in cath. Blood has been coming out of the cath. Pt has a distended lower abdomen. Pain in 10/10. No daily meds today. CBG is 325 and BP is 200/100. Pt is positive for MRSA. Pt has a Hx of dementia, but is appropriate at his baseline.

## 2016-08-28 NOTE — Progress Notes (Signed)
Patient ID: [name], male   DOB: 1940-12-29, 75 y.o.   MRN: [id number]    This encounter was created in error - please disregard.  Error  This encounter was created in error - please disregard.

## 2016-08-28 NOTE — Progress Notes (Signed)
Patient ID: [name], male   DOB: [birth year], 75 y.o.   MRN: [id number]   Location:   [facility] Room Number: 217-A Place of Service:  SNF (31)   CODE STATUS: DNR  Allergies  Allergen Reactions  . Pioglitazone Other (See Comments)    Edema   . Rosiglitazone Maleate Other (See Comments)    edema    Chief Complaint  Patient presents with  . Acute Visit    101 Temperature    HPI:  Nursing staff reports that he has a fever of 101. He is less responsive; and is unable to participate in the hpi or ros. He did tell me that he feels bad.  He is presently being treated with septra for left upper quad incision line.      Past Medical History:  Diagnosis Date  . Arthritis    "maybe"  . 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 CHF (congestive heart failure) ([place])   . Depression   . Hyperlipemia   . Hypertension   . Sleep apnea    "my machine [place]; got to get me another one" (09/01/2014)  . Stroke [id number]) 2009   left thalamic infarct, residual LUE weakness  . TIA (transient ischemic attack)    multiple in past, most recently in 2011  . Type II diabetes mellitus ([place])     Past Surgical History:  Procedure Laterality Date  . CARDIAC CATHETERIZATION  07/2010  . CRANIOTOMY  05/2008   for clipping of an incidental asymptomatic unruptured/notes 02/13/2011  . KNEE ARTHROSCOPY Left   . 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 SHOULDER ARTHROSCOPY W/ ROTATOR CUFF REPAIR Right     Social History   Social History  . Marital status: Married    Spouse name: [name]  . Number of children: 3  . Years of education: HS   Occupational History  . disabled    Social History Main Topics  . Smoking status: Former Smoker    Packs/day: 1.50    Years: 15.00    Types: Cigarettes    Quit date: 10/16/1983  . Smokeless tobacco: Never Used  . Alcohol  use No  . Drug use: No  . Sexual activity: No   Other Topics Concern  . Not on file   Social History Narrative   Pt lives at home with spouse.   Caffeine Use: 1 cup daily   Family History  Problem Relation Age of Onset  . Stroke Mother   . Pneumonia Father   . Diabetes Sister   . Diabetes Brother   . Diabetes Paternal Aunt   . Colon cancer Neg Hx       VITAL SIGNS BP (!) 172/80   Pulse 64   Temp 98 F (36.7 C) (Oral)   Resp 20   Ht 5\' 7"  (1.702 m)   Wt 188 lb 6 oz (85.4 kg)   SpO2 97%   BMI 29.50 kg/m   Patient's Medications  New Prescriptions   No medications on file  Previous Medications   CARVEDILOL (COREG) 12.5 MG TABLET    Take 1 tablet (12.5 mg total) by mouth 2 (two) times daily.   CLOPIDOGREL (PLAVIX) 75 MG TABLET    Take 1 tablet (75 mg total) by mouth daily.   DICLOFENAC SODIUM (VOLTAREN) 1 % GEL    Apply 4 g topically 4 (  four) times daily.   DONEPEZIL (ARICEPT) 10 MG TABLET    Take 1 tablet (10 mg total) by mouth at bedtime.   HYPROMELLOSE (NATURAL BALANCE TEARS) 0.4 % SOLN    Apply 2 drops to eye 3 (three) times daily.   INSULIN ASPART (NOVOLOG) 100 UNIT/ML INJECTION    Inject into the skin 3 (three) times daily before meals. Inject as per sliding scale: 0-59=0 call MD if less than 60, 60-150=3 units 301-400=5 units call MD if >400   LISINOPRIL (PRINIVIL,ZESTRIL) 40 MG TABLET    Take 1 tablet (40 mg total) by mouth daily.   MEMANTINE (NAMENDA XR) 28 MG CP24 24 HR CAPSULE    Take 1 capsule (28 mg total) by mouth daily.   MIRABEGRON ER (MYRBETRIQ) 50 MG TB24 TABLET    Take 50 mg by mouth daily.   OXYBUTYNIN (DITROPAN) 5 MG TABLET    Take 5 mg by mouth 2 (two) times daily.   PHENAZOPYRIDINE (PYRIDIUM) 100 MG TABLET    Take 100 mg by mouth 3 (three) times daily as needed for pain.   POLYETHYL GLYCOL-PROPYL GLYCOL (SYSTANE) 0.4-0.3 % SOLN    Place 1 drop into both eyes 2 (two) times daily.   PREDNISOLONE ACETATE (PRED FORTE) 1 % OPHTHALMIC SUSPENSION    Place  into the left eye 4 (four) times daily.   SACCHAROMYCES BOULARDII (FLORASTOR) 250 MG CAPSULE    Take 250 mg by mouth 2 (two) times daily.   SERTRALINE (ZOLOFT) 25 MG TABLET    Take 1 tablet (25 mg total) by mouth daily.   SIMVASTATIN (ZOCOR) 20 MG TABLET    Take 1 tablet (20 mg total) by mouth at bedtime.   SULFAMETHOXAZOLE-TRIMETHOPRIM (BACTRIM DS,SEPTRA DS) 800-160 MG TABLET    Take 1 tablet by mouth 2 (two) times daily.   TAMSULOSIN (FLOMAX) 0.4 MG CAPS CAPSULE    Take 0.4 mg by mouth at bedtime.   TORSEMIDE (DEMADEX) 10 MG TABLET    Take 10 mg by mouth daily.  Modified Medications   No medications on file  Discontinued Medications   No medications on file     SIGNIFICANT DIAGNOSTIC EXAMS  08-28-16: chest x-ray: mild atelectasis left lung bases. No evidence of pneumonia or chf   LABS REVIEWED:   08-16-16: wound culture: mrsa 08-20-16; glucose 205; bun 24.3; creat 1.11; k+ 3.7; na++ 147 08-17-16: glucose 205; bun 16.0; creat 1.09; k+ 3.9; na++ 142 08-28-16: wbc 7.3; hgb 13.1; hct 43.5; mcv 83.2; plt 205; glucose 219; bun 24.1; creat 1.32; k+ 4.1; na++ 142; liver normal albumin 3.6    Review of Systems  Unable to perform ROS: Medical condition    Physical Exam  Constitutional: No distress.  Eyes: Conjunctivae are normal.  Neck: Neck supple. No JVD present. No thyromegaly present.  Cardiovascular: Normal rate, regular rhythm and intact distal pulses.   Respiratory: Effort normal. No respiratory distress. He has no wheezes.  Crackles and wheezes on left side   GI: Soft. Bowel sounds are normal. He exhibits no distension. There is no tenderness.  Musculoskeletal: He exhibits no edema.     Lymphadenopathy:    He has no cervical adenopathy.  Neurological: He is alert.  Skin: Skin is warm and dry. He is not diaphoretic.  Left upper quad with dressing intact   Psychiatric: He has a normal mood and affect.     ASSESSMENT/ PLAN:  1. Pneumonia will begin levaquin 750 mg daily  for 10 days with florastor will have  him complete his septra as well. Will continue to monitor his status.  awaiting blood culture results    MD is aware of resident's narcotic use and is in agreement with current plan of care. We will attempt to wean resident as apropriate   Ok Edwards NP [facility]  Contact [phone] Monday through Friday 8am- 5pm  After hours call [phone]

## 2016-08-28 NOTE — ED Provider Notes (Addendum)
[facility] DEPT Provider Note   CSN: [id number] Arrival date & time: 08/28/16  2212     History   Chief Complaint Chief Complaint  Patient presents with  . Urinary Retention    HPI [name] is a 75 y.o. male.  HPI Patient presents from his nursing facility with a companion who assists with the history of present illness. She notes that the patient is a long-term resident of the facility, has a history of vascular dementia. Level V caveat secondary to this. Companion notes that over the past day patient has had cough, fever. Nursing home facility staff indicated the patient would again treatment for pneumonia, but has not yet started antibiotics. In addition to this, the patient began having urinary retention this past day. Staff placed a Foley catheter, but after did not drain anything over the course the day, the attempted to reposition the catheter. This resulted in bloody discharge into the catheter, but no urine production. Patient was sent here for evaluation.  Past Medical History:  Diagnosis Date  . Arthritis    "maybe"  . 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 CHF (congestive heart failure) ([place])   . Depression   . Hyperlipemia   . Hypertension   . Sleep apnea    "my machine [place]; got to get me another one" (09/01/2014)  . Stroke [facility]) 2009   left thalamic infarct, residual LUE weakness  . TIA (transient ischemic attack)    multiple in past, most recently in 2011  . Type II diabetes mellitus [facility])     Patient Active Problem List   Diagnosis Date Noted  . Neurogenic bladder 07/14/2016  . Advanced care planning/counseling discussion 04/18/2016  . Health care maintenance 01/06/2015  . Weakness generalized 09/01/2014  . Urinary incontinence 06/24/2013  . CVA (cerebral infarction) 03/26/2013  . Depression 03/26/2013  . Abnormality  of gait 01/20/2013  . Adjustment disorder with mixed anxiety and depressed mood 12/24/2012  . Vascular dementia 10/24/2012  . Mild cognitive impairment 05/25/2012  . Frequent falls 05/05/2012  . 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 Cerebral artery occlusion with cerebral infarction ([place]) 08/18/2008  . Hyperlipidemia associated with type 2 diabetes mellitus ([place]) 02/12/2008  . DEPENDENT EDEMA, LEGS, BILATERAL 01/14/2007  . G E R D 10/28/2006  . HYPERALDOSTERONISM 10/16/2006  . Obstructive sleep apnea 10/16/2006  . Diabetes type 2, controlled ([place]) 09/23/2006  . Essential hypertension 09/23/2006    Past Surgical History:  Procedure Laterality Date  . CARDIAC CATHETERIZATION  07/2010  . CRANIOTOMY  05/2008   for clipping of an incidental asymptomatic unruptured/notes 02/13/2011  . KNEE ARTHROSCOPY Left   . 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 SHOULDER ARTHROSCOPY W/ ROTATOR CUFF REPAIR Right        Home Medications    Prior to Admission medications   Medication Sig Start Date End Date Taking? Authorizing Provider  carvedilol (COREG) 12.5 MG tablet Take 1 tablet (12.5 mg total) by mouth 2 (two) times daily. 01/06/15   Aldine Contes, MD  clopidogrel (PLAVIX) 75 MG tablet Take 1 tablet (75 mg total) by mouth daily. 06/01/15   Dennie Bible, NP  diclofenac sodium (VOLTAREN) 1 % GEL Apply 4 g topically 4 (four) times daily. 03/20/16   Nischal Narendra, MD  donepezil (ARICEPT) 10 MG tablet Take 1 tablet (10 mg total) by mouth at  bedtime. 06/01/15   Dennie Bible, NP  Hypromellose (NATURAL BALANCE TEARS) 0.4 % SOLN Apply 2 drops to eye 3 (three) times daily.    Historical Provider, MD  insulin aspart (NOVOLOG) 100 UNIT/ML injection Inject into the skin 3 (three) times daily before meals. Inject as per sliding scale: 0-59=0 call MD if less than 60, 60-150=3 units 301-400=5 units call MD if >400    Historical Provider, MD  lisinopril (PRINIVIL,ZESTRIL) 40 MG  tablet Take 1 tablet (40 mg total) by mouth daily. 01/06/15   Nischal Narendra, MD  memantine (NAMENDA XR) 28 MG CP24 24 hr capsule Take 1 capsule (28 mg total) by mouth daily. 06/01/15   Dennie Bible, NP  mirabegron ER (MYRBETRIQ) 50 MG TB24 tablet Take 50 mg by mouth daily.    Historical Provider, MD  oxybutynin (DITROPAN) 5 MG tablet Take 5 mg by mouth 2 (two) times daily.    Historical Provider, MD  phenazopyridine (PYRIDIUM) 100 MG tablet Take 100 mg by mouth 3 (three) times daily as needed for pain.    Historical Provider, MD  Polyethyl Glycol-Propyl Glycol (SYSTANE) 0.4-0.3 % SOLN Place 1 drop into both eyes 2 (two) times daily.    Historical Provider, MD  prednisoLONE acetate (PRED FORTE) 1 % ophthalmic suspension Place into the left eye 4 (four) times daily.    Historical Provider, MD  saccharomyces boulardii (FLORASTOR) 250 MG capsule Take 250 mg by mouth 2 (two) times daily.    Historical Provider, MD  sertraline (ZOLOFT) 25 MG tablet Take 1 tablet (25 mg total) by mouth daily. 06/01/15   Dennie Bible, NP  simvastatin (ZOCOR) 20 MG tablet Take 1 tablet (20 mg total) by mouth at bedtime. 05/29/16   Nischal [name], MD  sulfamethoxazole-trimethoprim (BACTRIM DS,SEPTRA DS) 800-160 MG tablet Take 1 tablet by mouth 2 (two) times daily. 08/22/16 08/30/16  Historical Provider, MD  tamsulosin (FLOMAX) 0.4 MG CAPS capsule Take 0.4 mg by mouth at bedtime.    Historical Provider, MD  torsemide (DEMADEX) 10 MG tablet Take 10 mg by mouth daily.    Historical Provider, MD    Family History Family History  Problem Relation Age of Onset  . Stroke Mother   . Pneumonia Father   . Diabetes Sister   . Diabetes Brother   . Diabetes Paternal Aunt   . Colon cancer Neg Hx     Social History Social History  Substance Use Topics  . Smoking status: Former Smoker    Packs/day: 1.50    Years: 15.00    Types: Cigarettes    Quit date: 10/16/1983  . Smokeless tobacco: Never Used  . Alcohol  use No     Allergies   Pioglitazone and Rosiglitazone maleate   Review of Systems Review of Systems  Unable to perform ROS: Dementia     Physical Exam Updated Vital Signs Ht 5\' 7"  (1.702 m)   Wt 209 lb (94.8 kg)   BMI 32.73 kg/m   Physical Exam  Constitutional: No distress.  sickly sickly appearing elderly male in no distress not interacting, but offered brief unclear verbal responses  HENT:  Head: Normocephalic and atraumatic.  Eyes: Conjunctivae and EOM are normal.  Cardiovascular: Normal rate and regular rhythm.   Pulmonary/Chest: Effort normal. No stridor. No respiratory distress.  Abdominal: He exhibits no distension.  Genitourinary:     Musculoskeletal: He exhibits no edema.  Neurological:  Minimally interactive patient has known multiple prior strokes, known left-sided paresthesia, moves all extremities  minimally, spontaneously.  Skin: Skin is warm and dry.  Psychiatric: He has a normal mood and affect.  Nursing note and vitals reviewed.    ED Treatments / Results  Labs (all labs ordered are listed, but only abnormal results are displayed) Labs Reviewed  CBG MONITORING, ED - Abnormal; Notable for the following:       Result Value   Glucose-Capillary 290 (*)    All other components within normal limits  I-STAT CHEM 8, ED - Abnormal; Notable for the following:    Chloride 97 (*)    BUN 36 (*)    Creatinine, Ser 2.00 (*)    Glucose, Bld 310 (*)    All other components within normal limits  URINALYSIS, ROUTINE W REFLEX MICROSCOPIC (NOT AT [facility])  COMPREHENSIVE METABOLIC PANEL  CBC WITH DIFFERENTIAL/PLATELET  I-STAT CG4 LACTIC ACID, ED    EKG  EKG Interpretation  Date/Time:  Tuesday August 28 2016 22:19:02 EST Ventricular Rate:  72 PR Interval:    QRS Duration: 153 QT Interval:  416 QTC Calculation: 456 R Axis:   -86 Text Interpretation:  Sinus rhythm Right bundle branch block Inferior infarct, old Lateral leads are also involved No  significant change since last tracing Abnormal ekg Confirmed by Carmin Muskrat  MD [phone]) on 08/28/2016 10:28:29 PM       Radiology Dg Chest Portable 1 View  Result Date: 08/28/2016 CLINICAL DATA:  Initial evaluation for acute shortness of breath, question possible pneumonia. EXAM: PORTABLE CHEST 1 VIEW COMPARISON:  Prior radiograph from 11/10/2015. FINDINGS: Mild cardiomegaly stable. Mediastinal silhouette within normal limits. Right rib bowing of the trach air column or on the aortic knob is stable. Lungs are hypoinflated. Mild patchy and linear left basilar opacity. Atelectasis is favored, although subtle developing pneumonia not entirely excluded. No other focal airspace disease. No pulmonary edema or pleural effusion. No pneumothorax. No acute osseous abnormality. Advanced degenerative changes of both shoulders. IMPRESSION: 1. Shallow lung inflation with mild patchy and linear left basilar opacity. Atelectasis is favored. Possible mild and/or early/developing pneumonia not entirely excluded. 2. Stable cardiomegaly without pulmonary edema. Electronically Signed   By: Jeannine Boga M.D.   On: 08/28/2016 23:55    Procedures Procedures (including critical care time)  Medications Ordered in ED Medications  ceFEPIme (MAXIPIME) 1 g in dextrose 5 % 50 mL IVPB (not administered)     Initial Impression / Assessment and Plan / ED Course  I have reviewed the triage vital signs and the nursing notes.  Pertinent labs & imaging results that were available during my care of the patient were reviewed by me and considered in my medical decision making (see chart for details).  Clinical Course     After initial attempts to place a catheter resulted in only blood production, we obtained a three-way catheter for irrigation.  Subsequently we placed a three-way catheter, and continuous irrigation resulted in initially bloody fluid, but subsequent clearing of his urine. I reviewed findings thus  far with patient's family members, and with concern for pneumonia, urinary retention, patient will have initiation of antibiotics, be admitted for further evaluation and management.  Final Clinical Impressions(s) / ED Diagnoses  Healthcare acquired pneumonia Urinary retention Acute kidney injury   Carmin Muskrat, MD 08/29/16 [id number]    Carmin Muskrat, MD 08/29/16 [phone]

## 2016-08-29 ENCOUNTER — Inpatient Hospital Stay (HOSPITAL_COMMUNITY): Payer: PPO

## 2016-08-29 DIAGNOSIS — Z87448 Personal history of other diseases of urinary system: Secondary | ICD-10-CM | POA: Diagnosis not present

## 2016-08-29 DIAGNOSIS — G4733 Obstructive sleep apnea (adult) (pediatric): Secondary | ICD-10-CM | POA: Diagnosis not present

## 2016-08-29 DIAGNOSIS — R531 Weakness: Secondary | ICD-10-CM | POA: Diagnosis not present

## 2016-08-29 DIAGNOSIS — E119 Type 2 diabetes mellitus without complications: Secondary | ICD-10-CM | POA: Diagnosis not present

## 2016-08-29 DIAGNOSIS — L03115 Cellulitis of right lower limb: Secondary | ICD-10-CM | POA: Diagnosis not present

## 2016-08-29 DIAGNOSIS — B9562 Methicillin resistant Staphylococcus aureus infection as the cause of diseases classified elsewhere: Secondary | ICD-10-CM

## 2016-08-29 DIAGNOSIS — Z7902 Long term (current) use of antithrombotics/antiplatelets: Secondary | ICD-10-CM

## 2016-08-29 DIAGNOSIS — R338 Other retention of urine: Secondary | ICD-10-CM | POA: Diagnosis not present

## 2016-08-29 DIAGNOSIS — A4902 Methicillin resistant Staphylococcus aureus infection, unspecified site: Secondary | ICD-10-CM | POA: Diagnosis not present

## 2016-08-29 DIAGNOSIS — I639 Cerebral infarction, unspecified: Secondary | ICD-10-CM | POA: Diagnosis not present

## 2016-08-29 DIAGNOSIS — I10 Essential (primary) hypertension: Secondary | ICD-10-CM

## 2016-08-29 DIAGNOSIS — F4323 Adjustment disorder with mixed anxiety and depressed mood: Secondary | ICD-10-CM | POA: Diagnosis not present

## 2016-08-29 DIAGNOSIS — J984 Other disorders of lung: Secondary | ICD-10-CM | POA: Diagnosis not present

## 2016-08-29 DIAGNOSIS — Z8673 Personal history of transient ischemic attack (TIA), and cerebral infarction without residual deficits: Secondary | ICD-10-CM

## 2016-08-29 DIAGNOSIS — N179 Acute kidney failure, unspecified: Secondary | ICD-10-CM | POA: Diagnosis present

## 2016-08-29 DIAGNOSIS — J189 Pneumonia, unspecified organism: Secondary | ICD-10-CM | POA: Diagnosis not present

## 2016-08-29 DIAGNOSIS — Z09 Encounter for follow-up examination after completed treatment for conditions other than malignant neoplasm: Secondary | ICD-10-CM | POA: Diagnosis not present

## 2016-08-29 DIAGNOSIS — F329 Major depressive disorder, single episode, unspecified: Secondary | ICD-10-CM | POA: Diagnosis not present

## 2016-08-29 DIAGNOSIS — Z823 Family history of stroke: Secondary | ICD-10-CM

## 2016-08-29 DIAGNOSIS — E1165 Type 2 diabetes mellitus with hyperglycemia: Secondary | ICD-10-CM | POA: Diagnosis not present

## 2016-08-29 DIAGNOSIS — Z79899 Other long term (current) drug therapy: Secondary | ICD-10-CM

## 2016-08-29 DIAGNOSIS — Z794 Long term (current) use of insulin: Secondary | ICD-10-CM

## 2016-08-29 DIAGNOSIS — R05 Cough: Secondary | ICD-10-CM

## 2016-08-29 DIAGNOSIS — F0150 Vascular dementia without behavioral disturbance: Secondary | ICD-10-CM

## 2016-08-29 DIAGNOSIS — X58XXXA Exposure to other specified factors, initial encounter: Secondary | ICD-10-CM

## 2016-08-29 DIAGNOSIS — R059 Cough, unspecified: Secondary | ICD-10-CM

## 2016-08-29 DIAGNOSIS — Z960 Presence of urogenital implants: Secondary | ICD-10-CM

## 2016-08-29 DIAGNOSIS — R600 Localized edema: Secondary | ICD-10-CM

## 2016-08-29 DIAGNOSIS — R339 Retention of urine, unspecified: Secondary | ICD-10-CM

## 2016-08-29 DIAGNOSIS — N319 Neuromuscular dysfunction of bladder, unspecified: Secondary | ICD-10-CM

## 2016-08-29 DIAGNOSIS — S71101A Unspecified open wound, right thigh, initial encounter: Secondary | ICD-10-CM

## 2016-08-29 DIAGNOSIS — Z888 Allergy status to other drugs, medicaments and biological substances status: Secondary | ICD-10-CM

## 2016-08-29 DIAGNOSIS — N3941 Urge incontinence: Secondary | ICD-10-CM | POA: Diagnosis not present

## 2016-08-29 DIAGNOSIS — Z833 Family history of diabetes mellitus: Secondary | ICD-10-CM

## 2016-08-29 DIAGNOSIS — E1149 Type 2 diabetes mellitus with other diabetic neurological complication: Secondary | ICD-10-CM | POA: Diagnosis not present

## 2016-08-29 DIAGNOSIS — G8192 Hemiplegia, unspecified affecting left dominant side: Secondary | ICD-10-CM | POA: Diagnosis not present

## 2016-08-29 LAB — CBC WITH DIFFERENTIAL/PLATELET
BASOS ABS: 0 10*3/uL (ref 0.0–0.1)
BASOS PCT: 0 %
EOS ABS: 0 10*3/uL (ref 0.0–0.7)
EOS PCT: 0 %
HCT: 39.7 % (ref 39.0–52.0)
Hemoglobin: 12.4 g/dL — ABNORMAL LOW (ref 13.0–17.0)
Lymphocytes Relative: 11 %
Lymphs Abs: 1 10*3/uL (ref 0.7–4.0)
MCH: 24.6 pg — ABNORMAL LOW (ref 26.0–34.0)
MCHC: 31.2 g/dL (ref 30.0–36.0)
MCV: 78.8 fL (ref 78.0–100.0)
MONO ABS: 0.3 10*3/uL (ref 0.1–1.0)
MONOS PCT: 4 %
NEUTROS ABS: 7.4 10*3/uL (ref 1.7–7.7)
Neutrophils Relative %: 85 %
PLATELETS: 197 10*3/uL (ref 150–400)
RBC: 5.04 MIL/uL (ref 4.22–5.81)
RDW: 16.3 % — AB (ref 11.5–15.5)
WBC: 8.7 10*3/uL (ref 4.0–10.5)

## 2016-08-29 LAB — URINE MICROSCOPIC-ADD ON

## 2016-08-29 LAB — CG4 I-STAT (LACTIC ACID): LACTIC ACID, VENOUS: 1.27 mmol/L (ref 0.5–1.9)

## 2016-08-29 LAB — COMPREHENSIVE METABOLIC PANEL
ALBUMIN: 3.2 g/dL — AB (ref 3.5–5.0)
ALK PHOS: 75 U/L (ref 38–126)
ALT: 29 U/L (ref 17–63)
ANION GAP: 13 (ref 5–15)
AST: 28 U/L (ref 15–41)
BILIRUBIN TOTAL: 0.8 mg/dL (ref 0.3–1.2)
BUN: 35 mg/dL — ABNORMAL HIGH (ref 6–20)
CALCIUM: 9.3 mg/dL (ref 8.9–10.3)
CO2: 26 mmol/L (ref 22–32)
CREATININE: 2.02 mg/dL — AB (ref 0.61–1.24)
Chloride: 97 mmol/L — ABNORMAL LOW (ref 101–111)
GFR calc Af Amer: 36 mL/min — ABNORMAL LOW (ref >60)
GFR calc non Af Amer: 31 mL/min — ABNORMAL LOW (ref >60)
GLUCOSE: 317 mg/dL — AB (ref 65–99)
Potassium: 3.7 mmol/L (ref 3.5–5.1)
SODIUM: 136 mmol/L (ref 135–145)
TOTAL PROTEIN: 7.3 g/dL (ref 6.5–8.1)

## 2016-08-29 LAB — URINALYSIS, ROUTINE W REFLEX MICROSCOPIC
Bilirubin Urine: NEGATIVE
GLUCOSE, UA: NEGATIVE mg/dL
Ketones, ur: NEGATIVE mg/dL
Nitrite: NEGATIVE
Protein, ur: NEGATIVE mg/dL
SPECIFIC GRAVITY, URINE: 1.005 (ref 1.005–1.030)
pH: 5 (ref 5.0–8.0)

## 2016-08-29 LAB — BASIC METABOLIC PANEL
Anion gap: 11 (ref 5–15)
BUN: 38 mg/dL — AB (ref 6–20)
CHLORIDE: 96 mmol/L — AB (ref 101–111)
CO2: 29 mmol/L (ref 22–32)
CREATININE: 2.07 mg/dL — AB (ref 0.61–1.24)
Calcium: 9.4 mg/dL (ref 8.9–10.3)
GFR calc non Af Amer: 30 mL/min — ABNORMAL LOW (ref >60)
GFR, EST AFRICAN AMERICAN: 35 mL/min — AB (ref >60)
Glucose, Bld: 296 mg/dL — ABNORMAL HIGH (ref 65–99)
POTASSIUM: 4 mmol/L (ref 3.5–5.1)
Sodium: 136 mmol/L (ref 135–145)

## 2016-08-29 LAB — GLUCOSE, CAPILLARY
GLUCOSE-CAPILLARY: 248 mg/dL — AB (ref 65–99)
GLUCOSE-CAPILLARY: 207 mg/dL — AB (ref 65–99)
Glucose-Capillary: 213 mg/dL — ABNORMAL HIGH (ref 65–99)

## 2016-08-29 LAB — CBC
HCT: 39.4 % (ref 39.0–52.0)
HEMOGLOBIN: 12.4 g/dL — AB (ref 13.0–17.0)
MCH: 24.9 pg — AB (ref 26.0–34.0)
MCHC: 31.5 g/dL (ref 30.0–36.0)
MCV: 79.1 fL (ref 78.0–100.0)
PLATELETS: 192 10*3/uL (ref 150–400)
RBC: 4.98 MIL/uL (ref 4.22–5.81)
RDW: 16.6 % — ABNORMAL HIGH (ref 11.5–15.5)
WBC: 9.4 10*3/uL (ref 4.0–10.5)

## 2016-08-29 LAB — I-STAT CHEM 8, ED
BUN: 36 mg/dL — AB (ref 6–20)
CALCIUM ION: 1.15 mmol/L (ref 1.15–1.40)
CHLORIDE: 97 mmol/L — AB (ref 101–111)
Creatinine, Ser: 2 mg/dL — ABNORMAL HIGH (ref 0.61–1.24)
GLUCOSE: 310 mg/dL — AB (ref 65–99)
HCT: 41 % (ref 39.0–52.0)
Hemoglobin: 13.9 g/dL (ref 13.0–17.0)
POTASSIUM: 3.7 mmol/L (ref 3.5–5.1)
SODIUM: 138 mmol/L (ref 135–145)
TCO2: 26 mmol/L (ref 0–100)

## 2016-08-29 LAB — MRSA PCR SCREENING: MRSA BY PCR: NEGATIVE

## 2016-08-29 LAB — INFLUENZA PANEL BY PCR (TYPE A & B)
INFLAPCR: NEGATIVE
Influenza B By PCR: NEGATIVE

## 2016-08-29 LAB — PROCALCITONIN: Procalcitonin: 1.3 ng/mL

## 2016-08-29 MED ORDER — DEXTROSE 5 % IV SOLN
1.0000 g | INTRAVENOUS | Status: DC
  Administered 2016-08-29: 1 g via INTRAVENOUS
  Filled 2016-08-29: qty 10

## 2016-08-29 MED ORDER — INSULIN ASPART 100 UNIT/ML ~~LOC~~ SOLN
0.0000 [IU] | Freq: Every day | SUBCUTANEOUS | Status: DC
  Administered 2016-08-29: 2 [IU] via SUBCUTANEOUS

## 2016-08-29 MED ORDER — INSULIN GLARGINE 100 UNIT/ML ~~LOC~~ SOLN
12.0000 [IU] | Freq: Every day | SUBCUTANEOUS | Status: DC
  Filled 2016-08-29: qty 0.12

## 2016-08-29 MED ORDER — CARVEDILOL 12.5 MG PO TABS
12.5000 mg | ORAL_TABLET | Freq: Two times a day (BID) | ORAL | Status: DC
  Administered 2016-08-29: 12.5 mg via ORAL
  Filled 2016-08-29: qty 1

## 2016-08-29 MED ORDER — SODIUM CHLORIDE 0.9 % IV SOLN
INTRAVENOUS | Status: AC
  Administered 2016-08-29: 1000 mL via INTRAVENOUS

## 2016-08-29 MED ORDER — TAMSULOSIN HCL 0.4 MG PO CAPS: 0.4000 mg | ORAL_CAPSULE | Freq: Every day | ORAL | Status: DC

## 2016-08-29 MED ORDER — AZITHROMYCIN 500 MG PO TABS: 500.0000 mg | ORAL_TABLET | Freq: Every day | ORAL | Status: DC

## 2016-08-29 MED ORDER — VANCOMYCIN HCL 10 G IV SOLR
1500.0000 mg | Freq: Once | INTRAVENOUS | Status: DC
  Filled 2016-08-29: qty 1500

## 2016-08-29 MED ORDER — OXYBUTYNIN CHLORIDE 5 MG PO TABS
5.0000 mg | ORAL_TABLET | Freq: Two times a day (BID) | ORAL | Status: DC
  Administered 2016-08-29: 5 mg via ORAL
  Filled 2016-08-29: qty 1

## 2016-08-29 MED ORDER — DONEPEZIL HCL 10 MG PO TABS: 10.0000 mg | ORAL_TABLET | Freq: Every day | ORAL | Status: DC

## 2016-08-29 MED ORDER — DEXTROSE 5 % IV SOLN
2.0000 g | Freq: Once | INTRAVENOUS | Status: AC
  Administered 2016-08-29: 2 g via INTRAVENOUS
  Filled 2016-08-29: qty 2

## 2016-08-29 MED ORDER — MIRABEGRON ER 25 MG PO TB24
50.0000 mg | ORAL_TABLET | Freq: Every day | ORAL | Status: DC
  Administered 2016-08-29: 50 mg via ORAL
  Filled 2016-08-29: qty 1

## 2016-08-29 MED ORDER — SERTRALINE HCL 25 MG PO TABS
25.0000 mg | ORAL_TABLET | Freq: Every day | ORAL | Status: DC
  Administered 2016-08-29: 25 mg via ORAL
  Filled 2016-08-29: qty 1

## 2016-08-29 MED ORDER — PHENAZOPYRIDINE HCL 100 MG PO TABS: 100.0000 mg | ORAL_TABLET | Freq: Three times a day (TID) | ORAL | Status: DC | PRN

## 2016-08-29 MED ORDER — ACETAMINOPHEN 650 MG RE SUPP: 650.0000 mg | Freq: Four times a day (QID) | RECTAL | Status: DC | PRN

## 2016-08-29 MED ORDER — ENOXAPARIN SODIUM 40 MG/0.4ML ~~LOC~~ SOLN
40.0000 mg | SUBCUTANEOUS | Status: DC
  Administered 2016-08-29: 40 mg via SUBCUTANEOUS
  Filled 2016-08-29: qty 0.4

## 2016-08-29 MED ORDER — SODIUM CHLORIDE 0.9 % IV BOLUS (SEPSIS)
1000.0000 mL | Freq: Once | INTRAVENOUS | Status: AC
  Administered 2016-08-29: 1000 mL via INTRAVENOUS

## 2016-08-29 MED ORDER — AZITHROMYCIN 500 MG PO TABS: 500.0000 mg | ORAL_TABLET | Freq: Every day | ORAL | 0 refills | Status: DC

## 2016-08-29 MED ORDER — ACETAMINOPHEN 325 MG PO TABS: 650.0000 mg | ORAL_TABLET | Freq: Four times a day (QID) | ORAL | Status: DC | PRN

## 2016-08-29 MED ORDER — DEXTROSE 5 % IV SOLN
500.0000 mg | Freq: Every day | INTRAVENOUS | Status: DC
  Administered 2016-08-29: 500 mg via INTRAVENOUS
  Filled 2016-08-29: qty 500

## 2016-08-29 MED ORDER — MEMANTINE HCL ER 28 MG PO CP24
28.0000 mg | ORAL_CAPSULE | Freq: Every day | ORAL | Status: DC
  Administered 2016-08-29: 28 mg via ORAL
  Filled 2016-08-29: qty 1

## 2016-08-29 MED ORDER — SIMVASTATIN 20 MG PO TABS: 20.0000 mg | ORAL_TABLET | Freq: Every day | ORAL | Status: DC

## 2016-08-29 MED ORDER — HYPROMELLOSE 0.3 % OP GEL
1.0000 "application " | Freq: Three times a day (TID) | OPHTHALMIC | Status: DC
  Filled 2016-08-29: qty 3.5

## 2016-08-29 MED ORDER — POLYVINYL ALCOHOL 1.4 % OP SOLN
1.0000 [drp] | Freq: Two times a day (BID) | OPHTHALMIC | Status: DC
  Administered 2016-08-29: 1 [drp] via OPHTHALMIC
  Filled 2016-08-29: qty 15

## 2016-08-29 MED ORDER — INSULIN ASPART 100 UNIT/ML ~~LOC~~ SOLN
0.0000 [IU] | Freq: Three times a day (TID) | SUBCUTANEOUS | Status: DC
Start: 2016-08-29 — End: 2016-08-29
  Administered 2016-08-29 (×2): 3 [IU] via SUBCUTANEOUS

## 2016-08-29 MED ORDER — CLOPIDOGREL BISULFATE 75 MG PO TABS
75.0000 mg | ORAL_TABLET | Freq: Every day | ORAL | Status: DC
  Administered 2016-08-29: 75 mg via ORAL
  Filled 2016-08-29: qty 1

## 2016-08-29 MED ORDER — POLYETHYL GLYCOL-PROPYL GLYCOL 0.4-0.3 % OP SOLN: 1.0000 [drp] | Freq: Two times a day (BID) | OPHTHALMIC | Status: DC

## 2016-08-29 NOTE — NC FL2 (Signed)
[place] LEVEL OF CARE SCREENING TOOL     IDENTIFICATION  Patient Name: [name] Birthdate: 06-17-1941 Sex: male Admission Date (Current Location): 08/28/2016  [place] and [place] Number:  Herbalist and Address:  [facility], [place] [address], [place], [place] [address]      Provider Number: [id number]  Attending Physician Name and Address:  Lucious Groves, DO  Relative Name and Phone Number:       Current Level of Care: Hospital Recommended Level of Care: [place] Prior Approval Number:    Date Approved/Denied:   PASRR Number:    Discharge Plan: SNF    Current Diagnoses: Patient Active Problem List   Diagnosis Date Noted  . Acute kidney injury ([place]) 08/29/2016  . Cough   . Urinary retention   . Neurogenic bladder 07/14/2016  . Advanced care planning/counseling discussion 04/18/2016  . Health care maintenance 01/06/2015  . Weakness generalized 09/01/2014  . Urinary incontinence 06/24/2013  . CVA (cerebral infarction) 03/26/2013  . Depression 03/26/2013  . Abnormality of gait 01/20/2013  . Adjustment disorder with mixed anxiety and depressed mood 12/24/2012  . Vascular dementia 10/24/2012  . Mild cognitive impairment 05/25/2012  . Frequent falls 05/05/2012  . 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 Cerebral artery occlusion with cerebral infarction ([place]) 08/18/2008  . Hyperlipidemia associated with type 2 diabetes mellitus ([place]) 02/12/2008  . DEPENDENT EDEMA, LEGS, BILATERAL 01/14/2007  . G E R D 10/28/2006  . HYPERALDOSTERONISM 10/16/2006  . Obstructive sleep apnea 10/16/2006  . Diabetes type 2, controlled ([place]) 09/23/2006  . Essential hypertension 09/23/2006    Orientation RESPIRATION BLADDER Height & Weight     Self, Time, Place  Normal Continent, Indwelling catheter (Urinary catheter) Weight: 95 kg (209 lb 7 oz) Height:  5\' 7"  (170.2 cm)  BEHAVIORAL SYMPTOMS/MOOD  NEUROLOGICAL BOWEL NUTRITION STATUS      Continent Diet (Please see DC Summary)  AMBULATORY STATUS COMMUNICATION OF NEEDS Skin   Extensive Assist Verbally Other (Comment) (Wound on thigh)                       Personal Care Assistance Level of Assistance  Bathing, Feeding, Dressing Bathing Assistance: Maximum assistance Feeding assistance: Limited assistance Dressing Assistance: Maximum assistance     Functional Limitations Info             SPECIAL CARE FACTORS FREQUENCY  PT (By licensed PT)     PT Frequency: 5x/week              Contractures      Additional Factors Info  Code Status, Allergies, Isolation Precautions Code Status Info: DNR Allergies Info: Pioglitazone, Rosiglitazone Maleate     Isolation Precautions Info: contact precautions     Current Medications (08/29/2016):  This is the current hospital active medication list Current Facility-Administered Medications  Medication Dose Route Frequency Provider Last Rate Last Dose  . acetaminophen (TYLENOL) tablet 650 mg  650 mg Oral Q6H PRN Jule Ser, DO       Or  . acetaminophen (TYLENOL) suppository 650 mg  650 mg Rectal Q6H PRN Jule Ser, DO      . Derrill Memo ON 08/30/2016] azithromycin (ZITHROMAX) tablet 500 mg  500 mg Oral Daily Renee J Ackley, RPH      . carvedilol (COREG) tablet 12.5 mg  12.5 mg Oral BID Jule Ser, DO   12.5 mg at 08/29/16 [id number]  . clopidogrel (  PLAVIX) tablet 75 mg  75 mg Oral Daily Jule Ser, DO   75 mg at 08/29/16 [id number]  . donepezil (ARICEPT) tablet 10 mg  10 mg Oral QHS Jule Ser, DO      . enoxaparin (LOVENOX) injection 40 mg  40 mg Subcutaneous Q24H Jule Ser, DO   40 mg at 08/29/16 [id number]  . insulin aspart (novoLOG) injection 0-5 Units  0-5 Units Subcutaneous QHS Jule Ser, DO   2 Units at 08/29/16 [id number]  . insulin aspart (novoLOG) injection 0-9 Units  0-9 Units Subcutaneous TID WC Jule Ser, DO   3 Units at 08/29/16 1220  . memantine (NAMENDA  XR) 24 hr capsule 28 mg  28 mg Oral Daily Jule Ser, DO   28 mg at 08/29/16 [id number]  . mirabegron ER (MYRBETRIQ) tablet 50 mg  50 mg Oral Daily Jule Ser, DO   50 mg at 08/29/16 [id number]  . oxybutynin (DITROPAN) tablet 5 mg  5 mg Oral BID Jule Ser, DO   5 mg at 08/29/16 [id number]  . phenazopyridine (PYRIDIUM) tablet 100 mg  100 mg Oral TID PRN Jule Ser, DO      . polyvinyl alcohol (LIQUIFILM TEARS) 1.4 % ophthalmic solution 1 drop  1 drop Both Eyes BID Lucious Groves, DO   1 drop at 08/29/16 [phone]  . sertraline (ZOLOFT) tablet 25 mg  25 mg Oral Daily Jule Ser, DO   25 mg at 08/29/16 [id number]  . simvastatin (ZOCOR) tablet 20 mg  20 mg Oral QHS Jule Ser, DO      . tamsulosin [facility]) capsule 0.4 mg  0.4 mg Oral QHS Jule Ser, DO         Discharge Medications: Please see discharge summary for a list of discharge medications.  Relevant Imaging Results:  Relevant Lab Results:   Additional Information SS #: [id number]  Benard Halsted, LCSWA

## 2016-08-29 NOTE — Consult Note (Signed)
[facility] Nurse wound consult note Reason for Consult: Right Inner thigh wound Wound type: Full thickness, POA Pressure Ulcer POA: this wound N/A Measurement: 0.5cm x 1.5cm x 0.2 cm Wound bed: 100% red granulating tissue Drainage (amount, consistency, odor) moderate Periwound: intact Dressing procedure/placement/frequency: I have provided nurses with orders for To right inner thigh wound, Cleanse with NS, gently pat dry, apply Xeroform gauze, then pink foam dsg,change daily. We will not follow, but will remain available to this patient, to nursing, and the medical and/or surgical teams.  Please re-consult if we need to assist further.     Fara Olden, RN-C, WTA-C Wound Treatment Associate

## 2016-08-29 NOTE — Discharge Instructions (Signed)
Please follow up at [facility] tomorrow and the Internal Medicine Clinic in 2 weeks!  Urethral Stricture Introduction  Urethral stricture is narrowing of the tube (urethra) that carries urine from the bladder out of the body. The urethra can become narrow due to scar tissue from an injury or infection. This can make it difficult to pass urine. In women, the urethra opens above the vaginal opening. In men, the urethra opens at the tip of the penis, and the urethra is much longer than it is in women. Because of the length of the male urethra, urethral stricture is much more common in men. This condition is treated with surgery. What are the causes? Common causes of urethral stricture in men and women include:  Urinary tract infection (UTI).  Sexually transmitted infection (STI).  Use of a tube placed into the urethra to drain urine from the bladder (urinary catheter).  Urinary tract surgery. In men, common causes of urethral stricture include:  A severe injury to the pelvis.  Prostate surgery.  Injury to the penis. In many cases, the cause of urethral stricture may not be known. What increases the risk? Urethral stricture is more likely to develop in:  Men, especially men who have had prostate surgery.  People who use urinary catheters.  People who have had urinary tract surgery. What are the signs or symptoms? The most common symptom of this condition is difficulty passing urine. This may cause decreased urine flow, dribbling, or spraying of urine. Other symptoms may include:  Frequent UTIs.  Blood in the urine.  Pain when urinating.  Swelling of the penis in men.  Inability to pass urine (urinary obstruction). How is this diagnosed? This condition may be diagnosed based on:  Your medical history.  A physical exam.  Urine tests to check for infection or bleeding.  X-rays.  Ultrasound.  Retrograde urethrogram. This is a type of test in which dye is  injected into the urethra and then an X-ray is taken.  Urethroscopy. This is when a thin tube with a light and camera on the end (urethroscope) is used to look at the urethra. How is this treated? This condition is treated with surgery. The type of surgery that you have depends on the severity of your condition. You may have:  Urethral dilation. In this procedure, the narrow part of the urethra is stretched open (dilated) with dilating instruments or a small balloon.  Urethrotomy. In this procedure, a urethroscope is placed into the urethra, and the narrow part of the urethra is cut open with a surgical blade inserted through the urethroscope.  Open surgery. In this procedure, an incision is made in the urethra, the narrow part is removed, and the urethra is reconstructed. Follow these instructions at home:    Take over-the-counter and prescription medicines only as told by your health care provider.  If you were prescribed an antibiotic medicine, take it as told by your health care provider. Do not stop taking the antibiotic even if you start to feel better.  Drink enough fluid to keep your urine clear or pale yellow.  Keep all follow-up visits as told by your health care provider. This is important. Contact a health care provider if:  You have signs of a urinary tract infection, such as: ? Frequent urination or passing small amounts of urine frequently. ? Needing to urinate urgently. ? Pain or burning with urination. ? Urine that smells bad or unusual. ? Cloudy urine. ? Pain in the lower  abdomen or back. ? Trouble urinating. ? Blood in the urine. ? Vomiting or being less hungry than normal. ? Diarrhea or abdominal pain. ? Vaginal discharge, if you are male.  Your symptoms are getting worse instead of better. Get help right away if:  You cannot pass urine.  You have a fever.  You have swelling, bruising, or discoloration of your genital area. This includes the penis,  scrotum, and inner thighs for men, and the outer genital organs (vulva) and inner thighs for women.  You develop swelling in your legs.  You have difficulty breathing. This information is not intended to replace advice given to you by your health care provider. Make sure you discuss any questions you have with your health care provider. Document Released: 10/28/2015 Document Revised: 03/08/2016 Document Reviewed: 09/18/2015  2017 Elsevier

## 2016-08-29 NOTE — Progress Notes (Signed)
NURSING PROGRESS NOTE  [name] [id number] Discharge Data: 08/29/2016 5:28 PM Attending Provider: Lucious Groves, DO [id number], Donalee Citrin, MD     Lewis Moccasin to be D/C'd [place] per MD order. Report called to [place] at [place]. Pt is being discharged with foley catheter in place.  Last Vital Signs:  Blood pressure (!) 128/53, pulse (!) 50, temperature 99.6 F (37.6 C), temperature source Oral, resp. rate 20, height 5\' 7"  (1.702 m), weight 95 kg (209 lb 7 oz), SpO2 93 %.  Discharge Medication List   Medication List    STOP taking these medications   sulfamethoxazole-trimethoprim 800-160 MG tablet Commonly known as:  BACTRIM DS,SEPTRA DS     TAKE these medications   azithromycin 500 MG tablet Commonly known as:  ZITHROMAX Take 1 tablet (500 mg total) by mouth daily. Start taking on:  08/30/2016   carvedilol 12.5 MG tablet Commonly known as:  COREG Take 1 tablet (12.5 mg total) by mouth 2 (two) times daily.   clopidogrel 75 MG tablet Commonly known as:  PLAVIX Take 1 tablet (75 mg total) by mouth daily.   diclofenac sodium 1 % Gel Commonly known as:  VOLTAREN Apply 4 g topically 4 (four) times daily.   donepezil 10 MG tablet Commonly known as:  ARICEPT Take 1 tablet (10 mg total) by mouth at bedtime.   insulin aspart 100 UNIT/ML injection Commonly known as:  novoLOG Inject 0-5 Units into the skin 3 (three) times daily before meals. Inject as per sliding scale: 0-59=0 call MD if less than 60, 60-150=3 units 301-400=5 units call MD if >400   lisinopril 40 MG tablet Commonly known as:  PRINIVIL,ZESTRIL Take 1 tablet (40 mg total) by mouth daily.   memantine 28 MG Cp24 24 hr capsule Commonly known as:  NAMENDA XR Take 1 capsule (28 mg total) by mouth daily.   mirabegron ER 50 MG Tb24 tablet Commonly known as:  MYRBETRIQ Take 50 mg by mouth daily.   mupirocin ointment 2 % Commonly known as:  BACTROBAN Apply 1  application topically 2 (two) times daily.   NATURAL BALANCE TEARS 0.4 % Soln Generic drug:  Hypromellose Place 2 drops into both eyes 3 (three) times daily.   oxybutynin 5 MG tablet Commonly known as:  DITROPAN Take 5 mg by mouth 2 (two) times daily.   phenazopyridine 100 MG tablet Commonly known as:  PYRIDIUM Take 100 mg by mouth 3 (three) times daily as needed for pain.   prednisoLONE acetate 1 % ophthalmic suspension Commonly known as:  PRED FORTE Place 1 drop into the left eye 4 (four) times daily.   saccharomyces boulardii 250 MG capsule Commonly known as:  FLORASTOR Take 250 mg by mouth 2 (two) times daily.   sertraline 25 MG tablet Commonly known as:  ZOLOFT Take 1 tablet (25 mg total) by mouth daily.   simvastatin 20 MG tablet Commonly known as:  ZOCOR Take 1 tablet (20 mg total) by mouth at bedtime.   SYSTANE 0.4-0.3 % Soln Generic drug:  Polyethyl Glycol-Propyl Glycol Place 1 drop into both eyes 2 (two) times daily.   tamsulosin 0.4 MG Caps capsule Commonly known as:  FLOMAX Take 0.4 mg by mouth at bedtime.   torsemide 10 MG tablet Commonly known as:  DEMADEX Take 10 mg by mouth daily.

## 2016-08-29 NOTE — H&P (Signed)
Date: 08/29/2016               Patient Name:  [name] MRN: [id number]  DOB: October 11, 1941 Age / Sex: 75 y.o., male   PCP: Aldine Contes, MD         Medical Service: Internal Medicine Teaching Service         Attending Physician: Dr. Lucious Groves, DO    First Contact: Dr. Asencion Partridge, MD Pager: [phone]  Second Contact: Dr. Burgess Estelle, MD Pager: [phone]       After Hours (After 5p/  First Contact Pager: [phone]  weekends / holidays): Second Contact Pager: [phone]   Chief Complaint: Blood after foley placement, cough.  History of Present Illness:  This is a 75 y/o M with MHx significant for vascular dementia, neurogenic bladder, T2DM, Hx of CVA, HTN and HLD. He presents with a companion from his nursing facility to assist with the HPI. He is a long-term resident of [facility]. Companion reports that his dementia has been worse than basline, has been more lethargic and less interactive, had a decreased appetite, fevers, myalgias and cough for the past 5 days. Reports the cough sounds like its productive however he has not been able to expectorate. She reports the nursing home said he had pneumonia but did not start him on antibiotics specifically for this. Companion also reports that nursing facility replaced his chronic foley catheter 11/14 and had no urine output throughout the day. The attempted to reposition it and this resulted in gross bloody discharge and was without urine production. The companion also reports that she noticed a red area on his right inner thigh last week and he subsequently was started on Bactrim for MRSA with last dose scheduled 11/16.   In the ED, vital signs showed 98.8*, pulse 72, respirations 16 with 95% on RA, BP 156/77. Bladder scan showed 900 mL of urine. Several attempts were made to place a catheter which resulted only in blood production. 3-way catheter was placed and bladder was irrgated with 600 mL ns. Initially yielded  bloody fluid however subsequently cleared. Total output 1427mL. Portable CXR showed shallow lung inflation with mild patchy and linear left basilar opacity, favoring atelectasis however developing pneumonia was not excluded. He was subsequently started on treatment for HCAP with IV Vancomycin and Cefepime and received a 1L bolus of ns. He was subsequently admitted for c/o developing pneumonia and urinary retention.  Meds:  Bactrim ds PO BID until 08/30/16 Simvastatin 20mg  QHS Zoloft 25 mg PO QDaily Liquifilm Tears 1 drop BID Pyridium 100mg  PO TID PRN bladder pain Oxybutynin 5 mg PO BID Myrbetriq 50 mg PO daily Namenda 28 mg PO daily Lisinopril 40mg  PO daily Novolog TID per SS Donepezil 10 mg QHS Plavix 75 mg Q daily Coreg 12.5 mg PO BID Flomax 0.4 mg PO QHS  Allergies: Allergies as of 08/28/2016 - Review Complete 08/28/2016  Allergen Reaction Noted  . Pioglitazone Other (See Comments)   . Rosiglitazone maleate Other (See Comments)    Past Medical History:  Diagnosis Date  . Arthritis    "maybe"  . 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 CHF (congestive heart failure) ([place])   . Depression   . Hyperlipemia   . Hypertension   . Sleep apnea    "my machine [place]; got to get me another one" (09/01/2014)  .  Stroke [facility]) 2009   left thalamic infarct, residual LUE weakness  . TIA (transient ischemic attack)    multiple in past, most recently in 2011  . Type II diabetes mellitus ([place])    Family History:  Mother: Stroke Siblings: DM  Social History: Former smoker with 23 pack-year history. No alcohol or drug use.  Review of Systems: A complete ROS limited due to patients dementia.   Physical Exam: Blood pressure 161/72, pulse 68, resp. rate 21, height 5\' 7"  (1.702 m), weight 209 lb (94.8 kg), SpO2 93 %. General: Chronically-ill appearing african [place] male  resting comfortably in bed. Sleeping. In no acute distress. Arousable.  Cardiovascular: Regular rate and rhythm. No murmur appreciated. No rubs.  Pulmonary: CTA BL anterior chest. No wheezing or crackles appreciated. Breathing unlabored.  Abdomen: Soft, non-tender and not distended. Normoactive bowel sounds.  Extremities: Without peripheral edema or suspicious rash Skin: Right inner thigh with clean bandage in place. No surrounding erythema. Some blood on skin of groin secondary to earlier traumatic catheterization.  Neuro: Oriented to self, month and year. Responds to questions appropriately. Strength grossly intact.  EKG: Sinus rhythm, rate of 72. Old inferior infarct. RBBB. Unchanged from prior EKGs  1-view CXR: Hypoinflated lungs. Mild patchy left basilar opacity, favored atelectasis vs early pneumonia. Stable cardiomegaly without pulmonary edema.  Assessment & Plan by Problem: Active Problems:   Diabetes type 2, controlled ([place])   Hyperlipidemia associated with type 2 diabetes mellitus ([place])   Depression due to stroke [facility])   Essential hypertension   DEPENDENT EDEMA, LEGS, BILATERAL   Vascular dementia   Neurogenic bladder   HCAP (healthcare-associated pneumonia)   Acute kidney injury ([place])  ?H/CAP vs Viral Illness Pt here with reported 5-day history of fevers, myalgias, malaise, decreased appetite and cough. Fever 101 earlier per EMR however no fever here. Per companion she was told he had pneumonia but was not aware of him being started on any antibiotics for this. No clear treatment history obtained from pts chart from living facility however EMR shows he has been on Bactrim ds x 7 days, likely for skin infection. CXR not convincing for consolidation. He was started on empiric treatment for HCAP by ED with IV Vancomycin and Cefepime and also received a 1L ns bolus.  -2-view CXR in AM -Procalcitonin elevated at 1.30 -Influenza panel negative -MRSA negative -Received 1 dose each  of IV Vancomycin and IV Cefepime -Changed IV Abx for CAP, IV Azithromycin and Ceftriaxone -Repeat CBC again without leukocytosis -Repeat BMET still indicating AKI  -NS @ 51mL/hr   Neurogenic Bladder, Hematuria Hematuria likely secondary to foley placement. Neurogenic bladder managed by chronic foley catheter and medication. Foley currently draining appropriately. Consider holding meds if urinary rentention develops. Pt has chronic bladder pain. -Hb stable -Myrbetriq 50 mg daily -Oxybutynin 5mg  BID -Flomax 0.4 mg QHS  -Pyridium TID PRN pain  AKI Cr 2.0 (baseline 1.1-1.3). UA not suggestive of infection and in addition pt has been on Bactrim x1 week. AKI likely secondary to urinary retention. Will monitor with fluid administration.  -Holding Lisinopril and Torsemide -Recent Bactrim could also be playing a role in AKI  Type 2 Diabetes Controlled on ISS. HbA1c's x2 years <6.  -ISS-sensitive + nighttime coverage  Vascular Dementia Pts companion noted patient hasn't been at his baseline x4-5 days. No new neurological deficits noted by companion or on examination. Suspect change secondary to acute illness.  -Donepezil 10mg  QHS, Namenda 28mg  daily continued  History of CVA, Complicated by  Depression -Continue Plavix -Continue Simvastatin 20mg  -Continue Zoloft 25mg  daily  HTN Controlled.  -Continue Coreg 12.5 mg BID  Right Inner Thigh Wound Bandaged, without surrounding erythema. Per companion, is MRSA and has been on Bactrim ds for 1 week.  -Consulted wound care for their assistance  Dependent Edema of BL LE Holding diuretic in setting of AKI. No edema on exam.  IVF: NS @ 74mL/hr Diet: HH Code status: DNR DVT prophylaxis: Lovenox  Dispo: Admit patient to Inpatient with expected length of stay greater than 2 midnights.  SignedEinar Gip, DO 08/29/2016, 1:38 AM  Pager: [phone]

## 2016-08-29 NOTE — Evaluation (Signed)
Physical Therapy Evaluation Patient Details Name: AERYK MCIRVIN MRN: [id number] DOB: 28-Dec-1940 Today's Date: 08/29/2016   History of Present Illness  This is a 75 y/o M with MHx significant for vascular dementia, neurogenic bladder, T2DM, Hx of CVA, HTN and HLD.  He is a long-term resident of [facility]. Reports are that his dementia is worse than basline, has been more lethargic and less interactive, had a decreased appetite, fevers, myalgias and cough. Reports are that nursing facility replaced his chronic foley catheter 11/14 and had no urine output. Patient admitted for urinary retention.  Clinical Impression  The patient  Required extensive assistance to sit  Up at the edge of the bed. He demonstrates poor sitting balance. Did not attempt transfers due to poor trunk control. No information is available  In reference to  Patient's prior functional level. Pt admitted with above diagnosis. Pt currently with functional limitations due to the deficits listed below (see PT Problem List).  Pt will benefit from skilled PT to increase their independence and safety with mobility to allow discharge to the venue listed below.       Follow Up Recommendations SNF;Supervision/Assistance - 24 hour (ifpatient is near baseline, no Skilled PT needs are recommended.)    Equipment Recommendations  None recommended by PT    Recommendations for Other Services       Precautions / Restrictions Precautions Precautions: Fall Restrictions Weight Bearing Restrictions: No      Mobility  Bed Mobility Overal bed mobility: Needs Assistance Bed Mobility: Supine to Sit;Sit to Supine     Supine to sit: Max assist;HOB elevated Sit to supine: Max assist   General bed mobility comments: multimodal cues  to  move legs, assist with trunk. Assist for legs onto bed and trunk back to supine position.  Transfers                 General transfer comment: NT due to poor sitting  balance  Ambulation/Gait                Stairs            Wheelchair Mobility    Modified Rankin (Stroke Patients Only)       Balance Overall balance assessment: Needs assistance Sitting-balance support: Feet supported;Bilateral upper extremity supported Sitting balance-Leahy Scale: Zero Sitting balance - Comments: trunk is rigid, gradually improved with flexing trunk and  supporting self near midline for only a few seconds at a time. Postural control: Posterior lean                                   Pertinent Vitals/Pain Pain Assessment: Faces Faces Pain Scale: Hurts little more Pain Location: neck Pain Descriptors / Indicators: Discomfort Pain Intervention(s): Monitored during session;Repositioned    Home Living Family/patient expects to be discharged to:: Skilled nursing facility                 Additional Comments: from [facility]    Prior Function           Comments: uncertain of  level of function, patient states that he walks with a walker and uses a WC.     Hand Dominance        Extremity/Trunk Assessment   Upper Extremity Assessment: RUE deficits/detail;LUE deficits/detail RUE Deficits / Details: decreased UE elevation, movements are rigid     LUE Deficits / Details: appears WFL, could  reach over head for rail and pull   Lower Extremity Assessment: RLE deficits/detail;LLE deficits/detail RLE Deficits / Details: grossly 4/5 in bed  LLE Deficits / Details: grossly 3+/5 in bed, did not stand  Cervical / Trunk Assessment: Other exceptions  Communication   Communication:  (delay in ansewring.)  Cognition Arousal/Alertness: Awake/alert Behavior During Therapy: Flat affect Overall Cognitive Status: No family/caregiver present to determine baseline cognitive functioning Area of Impairment: Orientation Orientation Level: Time;Situation             General Comments: oriented to [place] and  [facility]    General Comments      Exercises     Assessment/Plan    PT Assessment Patient needs continued PT services  PT Problem List Obesity          PT Treatment Interventions Functional mobility training;Therapeutic activities;Therapeutic exercise;Balance training;Patient/family education    PT Goals (Current goals can be found in the Care Plan section)  Acute Rehab PT Goals Patient Stated Goal: to have some lunch PT Goal Formulation: With patient Time For Goal Achievement: 09/12/16 Potential to Achieve Goals: Fair    Frequency Min 2X/week   Barriers to discharge        Co-evaluation               End of Session   Activity Tolerance: Patient tolerated treatment well Patient left: in bed;with call bell/phone within reach;with bed alarm set Nurse Communication: Mobility status         Time: [id number] PT Time Calculation (min) (ACUTE ONLY): 17 min   Charges:   PT Evaluation $PT Eval Low Complexity: 1 Procedure     PT G CodesClaretha Cooper 08/29/2016, 1:28 PM Tresa Endo PT [phone]

## 2016-08-29 NOTE — Care Management Note (Signed)
Case Management Note  Patient Details  Name: [name] MRN: [id number] Date of Birth: 1940-11-15  Subjective/Objective:                 Patient admitted from [facility]. HCAP, neorogenic bladder, Hx CVA L side weakness. Receiving IV Abx.    Action/Plan:  Return to SNF when medically clear as facilitated by CSW.   Expected Discharge Date:                  Expected Discharge Plan:  Skilled Nursing Facility  In-House Referral:  Clinical Social Work  Discharge planning Services  CM Consult  Post Acute Care Choice:    Choice offered to:     DME Arranged:    DME Agency:     HH Arranged:    [place] Agency:     Status of Service:  Completed, signed off  If discussed at [name], dates discussed:    Additional Comments:  Carles Collet, RN 08/29/2016, 1:36 PM

## 2016-08-29 NOTE — Progress Notes (Signed)
Called to get report on patient.  Report from [place], Therapist, sports. Patient is from [facility].  At the facility today, attempted to change foley, met resistance,, and patient, according to RN, was draining blood. They were able to get new foley in. Pt has neuogenic bladder. IV in R Hand with Vanc and cefepime infusing, per RN.

## 2016-08-29 NOTE — Progress Notes (Signed)
Subjective: [name] is resting comfortably in bed today. He reports some pain in his right hand where he has an IV placed. He is oriented to person and year but not place. He denies any issues with cough this morning and his lungs sound clear.   Objective: Vital signs in last 24 hours: Vitals:   08/28/16 2345 08/29/16 0000 08/29/16 0157 08/29/16 0628  BP: 174/86 161/72 (!) 152/56 (!) 130/48  Pulse: 71 68 71 62  Resp:  21 17 17   Temp:   98.8 F (37.1 C) 98.1 F (36.7 C)  TempSrc:   Oral Oral  SpO2: 95% 93% 94% 99%  Weight:    95 kg (209 lb 7 oz)  Height:        Intake/Output Summary (Last 24 hours) at 08/29/16 [id number] Last data filed at 08/29/16 0553  Gross per 24 hour  Intake             9250 ml  Output             3700 ml  Net             5550 ml    Physical Exam General: Chronically-ill appearing african [place] male resting comfortably in bed. Sleeping. In no acute distress. Arousable.  Cardiovascular: Regular rate and rhythm. No murmur appreciated. No rubs.  Pulmonary: CTA BL posterior chest. No wheezing or crackles appreciated. Breathing unlabored.  Abdomen: Soft, non-tender and not distended. Normoactive bowel sounds.  Extremities: Without peripheral edema or suspicious rash Skin: Right inner thigh with clean bandage in place. No surrounding erythema.  Genitourinary: Dried blood on bandage below penile meatus, apears torn along its inferior aspect, secondary to earlier traumatic catheterization.  Neuro: Oriented to self, month and year. Responds to questions appropriately.  Psych: Appropriate affect  Labs / Imaging / Procedures: CBC Latest Ref Rng & Units 08/29/2016 08/29/2016 08/28/2016  WBC 4.0 - 10.5 K/uL 9.4 - 8.7  Hemoglobin 13.0 - 17.0 g/dL 12.4(L) 13.9 12.4(L)  Hematocrit 39.0 - 52.0 % 39.4 41.0 39.7  Platelets 150 - 400 K/uL 192 - 197   BMP Latest Ref Rng & Units 08/29/2016 08/29/2016 08/28/2016  Glucose 65 - 99 mg/dL 296(H) 310(H) 317(H)  BUN 6 -  20 mg/dL 38(H) 36(H) 35(H)  Creatinine 0.61 - 1.24 mg/dL 2.07(H) 2.00(H) 2.02(H)  BUN/Creat Ratio 10 - 24 - - -  Sodium 135 - 145 mmol/L 136 138 136  Potassium 3.5 - 5.1 mmol/L 4.0 3.7 3.7  Chloride 101 - 111 mmol/L 96(L) 97(L) 97(L)  CO2 22 - 32 mmol/L 29 - 26  Calcium 8.9 - 10.3 mg/dL 9.4 - 9.3   Dg Chest Portable 1 View  Result Date: 08/28/2016 CLINICAL DATA:  Initial evaluation for acute shortness of breath, question possible pneumonia. EXAM: PORTABLE CHEST 1 VIEW COMPARISON:  Prior radiograph from 11/10/2015. FINDINGS: Mild cardiomegaly stable. Mediastinal silhouette within normal limits. Right rib bowing of the trach air column or on the aortic knob is stable. Lungs are hypoinflated. Mild patchy and linear left basilar opacity. Atelectasis is favored, although subtle developing pneumonia not entirely excluded. No other focal airspace disease. No pulmonary edema or pleural effusion. No pneumothorax. No acute osseous abnormality. Advanced degenerative changes of both shoulders. IMPRESSION: 1. Shallow lung inflation with mild patchy and linear left basilar opacity. Atelectasis is favored. Possible mild and/or early/developing pneumonia not entirely excluded. 2. Stable cardiomegaly without pulmonary edema. Electronically Signed   By: Jeannine Boga M.D.   On: 08/28/2016 23:55  Assessment/Plan: [name] is a 75 y.o. gentleman with PMH vascular dementia, neurogenic bladder, T2DM, Hx of CVA, HTN and HLD admitted for urinary retentions and presumed pneumonia.   Active Problems:   Diabetes type 2, controlled ([place])   Hyperlipidemia associated with type 2 diabetes mellitus ([place])   Depression due to stroke [facility])   Essential hypertension   DEPENDENT EDEMA, LEGS, BILATERAL   Vascular dementia   Neurogenic bladder   HCAP (healthcare-associated pneumonia)   Acute kidney injury ([facility])   Cough   Urinary retention  Cough, suspect viral URI, reported 5-day history of fevers,  myalgias, malaise, decreased appetite and cough. Fever 101 earlier per EMR however no fever here. Per companion she was told he had pneumonia but was not aware of him being started on any antibiotics for this. No clear treatment history obtained from pts chart from living facility however EMR shows he has been on Bactrim ds x 7 days, likely for skin infection. CXR not convincing for consolidation. He was started on empiric treatment for HCAP by ED with IV Vancomycin and Cefepime and also received a 1L ns bolus.  -2-view CXR in AM -Procalcitonin elevated at 1.30 -Influenza panel negative -MRSA negative -Received 1 dose each of IV Vancomycin and IV Cefepime -Changed IV Abx for CAP, IV Azithromycin and Ceftriaxone, consider transition to po Azithro or Augmentin for empiric coverage depending on CXR results -Repeat CBC again without leukocytosis -Repeat BMET still indicating AKI  -NS @ 58mL/hr   Neurogenic Bladder, Hematuria Hematuria likely secondary to foley placement. Neurogenic bladder managed by chronic foley catheter and medication. Foley currently draining appropriately. Consider holding meds if urinary rentention develops. Pt has chronic bladder pain. -Hb stable -Myrbetriq 50 mg daily -Oxybutynin 5mg  BID -Flomax 0.4 mg QHS  -Pyridium TID PRN pain  Distal urethral tear, secondary to traumatic foley placement, should heal well - Consider follow up with urology  AKI Cr 2.0 (baseline 1.1-1.3). UA not suggestive of infection and in addition pt has been on Bactrim x1 week. AKI likely secondary to urinary retention. Will monitor with fluid administration.  -Holding Lisinopril and Torsemide -Recent Bactrim could also be playing a role in AKI  Type 2 Diabetes, glucose running in 200s+ overnight and this morning Controlled on ISS. HbA1c's x2 years <6. May be elevated in setting of acute illness. - ISS-sensitive TID - Consider adding long acting, assess glucose control at follow  up  Vascular Dementia Pts companion noted patient hasn't been at his baseline x4-5 days. No new neurological deficits noted by companion or on examination. Suspect change secondary to acute illness.  -Donepezil 10mg  QHS, Namenda 28mg  daily continued  History of CVA, Complicated by Depression -Continue Plavix -Continue Simvastatin 20mg  -Continue Zoloft 25mg  daily  HTN Controlled.  -Continue Coreg 12.5 mg BID  Right Inner Thigh Wound Bandaged, without surrounding erythema. Per companion, is MRSA and has been on Bactrim ds for 1 week. Appears to be healing/ 100% granulation tissue -Wound care recs: cleanse with NS, gently pat dry, apply Xeroform gauze, then pink foam dsg, change daily.  Dependent Edema of BL LE Holding diuretic in setting of AKI. No edema on exam.  IVF: NS @ 82mL/hr Diet: HH Code status: DNR DVT prophylaxis: Lovenox  Dispo: Anticipated discharge in approximately 0-1 day(s).   LOS: 0 days   Asencion Partridge, MD 08/29/2016, 9:07 AM Pager: [phone]

## 2016-08-29 NOTE — Progress Notes (Signed)
Inpatient Diabetes Program Recommendations  AACE/ADA: New Consensus Statement on Inpatient Glycemic Control (2015)  Target Ranges:  Prepandial:   less than 140 mg/dL      Peak postprandial:   less than 180 mg/dL (1-2 hours)      Critically ill patients:  140 - 180 mg/dL   Lab Results  Component Value Date   GLUCAP 248 (H) 08/29/2016   HGBA1C 6.0 08/03/2015    Review of Glycemic Control:  Results for DOUA, CONDELLO (MRN [id number]) as of 08/29/2016 11:20  Ref. Range 08/28/2016 22:27 08/29/2016 07:44  Glucose-Capillary Latest Ref Range: 65 - 99 mg/dL 290 (H) 248 (H)   Diabetes history: Type 2 diabetes Outpatient Diabetes medications: Novolog 0-5 units tid with meals Current orders for Inpatient glycemic control:  Novolog sensitive tid with meals  Inpatient Diabetes Program Recommendations:   Please consider adding Lantus 15 units daily.  Also consider adding Novolog meal coverage 3 units tid with meal.  Thanks, Adah Perl, RN, BC-ADM Inpatient Diabetes Coordinator Pager [phone] (8a-5p)

## 2016-08-29 NOTE — Discharge Summary (Signed)
Name: [name] MRN: [id number] DOB: 01-Mar-1941 75 y.o. PCP: Henry Contes, MD  Date of Admission: 08/28/2016 10:12 PM Date of Discharge: 08/29/2016 Attending Physician: Henry Groves, DO  Discharge Diagnosis: 1. Urinary retention  Principal Problem:   Urinary retention Active Problems:   Cough   Diabetes type 2, controlled ([name])   Hyperlipidemia associated with type 2 diabetes mellitus ([name])   Depression due to stroke [name])   Essential hypertension   DEPENDENT EDEMA, LEGS, BILATERAL   Vascular dementia   Neurogenic bladder   Acute kidney injury ([name])   Discharge Medications:   Medication List    STOP taking these medications   sulfamethoxazole-trimethoprim 800-160 MG tablet Commonly known as:  BACTRIM DS,SEPTRA DS     TAKE these medications   azithromycin 500 MG tablet Commonly known as:  ZITHROMAX Take 1 tablet (500 mg total) by mouth daily. Start taking on:  08/30/2016   carvedilol 12.5 MG tablet Commonly known as:  COREG Take 1 tablet (12.5 mg total) by mouth 2 (two) times daily.   clopidogrel 75 MG tablet Commonly known as:  PLAVIX Take 1 tablet (75 mg total) by mouth daily.   diclofenac sodium 1 % Gel Commonly known as:  VOLTAREN Apply 4 g topically 4 (four) times daily.   donepezil 10 MG tablet Commonly known as:  ARICEPT Take 1 tablet (10 mg total) by mouth at bedtime.   insulin aspart 100 UNIT/ML injection Commonly known as:  novoLOG Inject 0-5 Units into the skin 3 (three) times daily before meals. Inject as per sliding scale: 0-59=0 call MD if less than 60, 60-150=3 units 301-400=5 units call MD if >400   lisinopril 40 MG tablet Commonly known as:  PRINIVIL,ZESTRIL Take 1 tablet (40 mg total) by mouth daily.   memantine 28 MG Cp24 24 hr capsule Commonly known as:  NAMENDA XR Take 1 capsule (28 mg total) by mouth daily.   mirabegron ER 50 MG Tb24 tablet Commonly known as:  MYRBETRIQ Take 50 mg by mouth daily.   mupirocin  ointment 2 % Commonly known as:  BACTROBAN Apply 1 application topically 2 (two) times daily.   NATURAL BALANCE TEARS 0.4 % Soln Generic drug:  Hypromellose Place 2 drops into both eyes 3 (three) times daily.   oxybutynin 5 MG tablet Commonly known as:  DITROPAN Take 5 mg by mouth 2 (two) times daily.   phenazopyridine 100 MG tablet Commonly known as:  PYRIDIUM Take 100 mg by mouth 3 (three) times daily as needed for pain.   prednisoLONE acetate 1 % ophthalmic suspension Commonly known as:  PRED FORTE Place 1 drop into the left eye 4 (four) times daily.   saccharomyces boulardii 250 MG capsule Commonly known as:  FLORASTOR Take 250 mg by mouth 2 (two) times daily.   sertraline 25 MG tablet Commonly known as:  ZOLOFT Take 1 tablet (25 mg total) by mouth daily.   simvastatin 20 MG tablet Commonly known as:  ZOCOR Take 1 tablet (20 mg total) by mouth at bedtime.   SYSTANE 0.4-0.3 % Soln Generic drug:  Polyethyl Glycol-Propyl Glycol Place 1 drop into both eyes 2 (two) times daily.   tamsulosin 0.4 MG Caps capsule Commonly known as:  FLOMAX Take 0.4 mg by mouth at bedtime.   torsemide 10 MG tablet Commonly known as:  DEMADEX Take 10 mg by mouth daily.       Disposition and follow-up:   [name] was discharged from [facility] in Stable condition.  At the hospital follow up visit please address:  1. Urinary retention - with neurogenic bladder and chronic foley, now likely developing urethral stricture. Nursing facility failed to replace foley catheter on 11/14, several re-attempts resulted in bloody discharge but no urine output. In our ED, 3-way catheter was placed and flushed with 600 ml NS which allowed the catheter to fall into place - voided approx 1.5L. U/A revealed hematuria but no evidence of infection, urine visible clear by follow day. Evaluate for ongoing hematuria, UTI, or urethral stricture.  AKI - BUN 38 and Cr 2.07 from  baseline 1.1-1.3, likely secondary to urinary retention and possibly recent course of bactrim. Assess for recovery to baseline Cr.  Cough - patient presented with reported cough and fevers, likely viral URI in retrospect. He remained afebrile without leukocytosis or cough throughout hospital stay. CXR on admission and the following day showed no signs of pneumonia, merely basilar atelectasis in one study. He received a dose of IV Vanc/Cefepime, de-escalated to the following day to IV Ceftriaxone and Azithromycin. After no symptoms and second chest ray negative, IV antibiotics discontinued and given 5 days oral Azithromycin. Assess for cough or evidence of pneumonia.  T2DM - persistent hyperglycemia into the 200s overnight, diabetes coordinator recommended starting long-acting insulin but this was deferred in the setting of acute illness. Assess glycemic control and need for additional long-acting insulin at PCP/follow up visit.   Right Inner Thigh Wound, SNF confirmed that this was + for MRSA, completed course of Bactrim. Evaluated by wound care who found it healing well, recommended cleansing with normal saline, gently pat dry, apply Xeroform gauze, then pink foam dressing, and change daily. Assess for healing or worsening infection  2.  Labs / imaging needed at time of follow-up: BMP, HbA1c, CBG, Urinalysis  3.  Pending labs/ test needing follow-up: Urine culture  Follow-up Appointments: Follow-up Information    [place]. Go on 09/12/2016.   Why:  At 10:15AM, please arrive 15 minutes early to check in. Contact information: [address] [place] [phone]       [facility]. Go on 08/30/2016.   Why:  At 1:15 PM, please arrive a few minutes early to check in. Contact information: [place] [phone]          Hospital Course by problem list: Principal Problem:    Urinary retention Active Problems:   Cough   Diabetes type 2, controlled ([place])   Hyperlipidemia associated with type 2 diabetes mellitus ([name])   Depression due to stroke [name])   Essential hypertension   DEPENDENT EDEMA, LEGS, BILATERAL   Vascular dementia   Neurogenic bladder   Acute kidney injury ([place])   1. Urinary retention, likely due to urethral stricture in the setting of chronic foley catheter use   Mr. Starcher is a 75 year old male with history of Dementia, CVA, Neurogenic bladder with chronic foley catheters, HTN and HLD who was sent from his SNF after he was found to be less interactive with a decreased appetite, they were concerned for possible obstruction of his catheter and attempted repositioning multiple times which casued bloody discharge and resulted in no urine output. In the ED a bladder scan showed urinary retention with 900cc of fluid, this was relieved with normal saline flush and placement of 3-way urinary catheter after several attempts. Drained 1.5+ liters urine, initially bloody but cleared by  following day. Urinalysis revealed hematuria but no evidence of UTI. BMP revealed an elevated creatinine which is consistent with obstructive acute kidney injury. Numerous catheterization attempts appear to have resulted in a small inferior urethral tear as well. His foley catheter is draining adequately with no abdominal pain.   2. Cough  Presented with concern for 5-day history of cough and subjective fevers, myalgias, malaise, decreased appetite. CXR on admission (11/14) showed no conclusive consolidation. He was started on empiric HCAP antibiotics Vancomycin and Cefepime and received 1L IVF. His WBC count was never elevated, and procalcitonin was elevated at 1.30 but this is likely chronically elevated in the setting of urinary retention and indwelling catheter. Influenza was negative. Antibiotics were de-escalated to IV Ceftriaxone and IV Azithromycin on 11/15, and then to oral  course of Azithromycin. Patient was never observed to have cough throughout hospital stay, and remained afebrile. Repeat 2-view CXR on 11/15 showed no active disease. Patient thought to have had a viral upper respiratory infection.   3. T2DM - well-controlled in past with A1c 6.0 since 2015. During hospital stay was observed to have persistent hyperglycemia into the 200s overnight, although this may occur in the setting of acute illness. Diabetes coordinator recommended starting long-acting insulin but this was deferred in the setting of acute illness. Will need to follow up with primary care providers to assess glycemic control and adjust his insulin regimen.   Discharge Vitals:   BP (!) 128/53 (BP Location: Right Arm)   Pulse (!) 50   Temp 99.6 F (37.6 C) (Oral)   Resp 20   Ht 5\' 7"  (1.702 m)   Wt 95 kg (209 lb 7 oz)   SpO2 93%   BMI 32.80 kg/m   Pertinent Labs, Studies, and Procedures:  CBC Latest Ref Rng & Units 08/29/2016 08/29/2016 08/28/2016  WBC 4.0 - 10.5 K/uL 9.4 - 8.7  Hemoglobin 13.0 - 17.0 g/dL 12.4(L) 13.9 12.4(L)  Hematocrit 39.0 - 52.0 % 39.4 41.0 39.7  Platelets 150 - 400 K/uL 192 - 197   BMP Latest Ref Rng & Units 08/29/2016 08/29/2016 08/28/2016  Glucose 65 - 99 mg/dL 296(H) 310(H) 317(H)  BUN 6 - 20 mg/dL 38(H) 36(H) 35(H)  Creatinine 0.61 - 1.24 mg/dL 2.07(H) 2.00(H) 2.02(H)  BUN/Creat Ratio 10 - 24 - - -  Sodium 135 - 145 mmol/L 136 138 136  Potassium 3.5 - 5.1 mmol/L 4.0 3.7 3.7  Chloride 101 - 111 mmol/L 96(L) 97(L) 97(L)  CO2 22 - 32 mmol/L 29 - 26  Calcium 8.9 - 10.3 mg/dL 9.4 - 9.3   Urinalysis    Component Value Date/Time   COLORURINE STRAW (A) 08/29/2016 0017   APPEARANCEUR CLEAR 08/29/2016 0017   APPEARANCEUR Clear 02/19/2014 0745   LABSPEC 1.005 08/29/2016 0017   PHURINE 5.0 08/29/2016 0017   GLUCOSEU NEGATIVE 08/29/2016 0017   GLUCOSEU NEG mg/dL 01/14/2007 2016   HGBUR LARGE (A) 08/29/2016 0017   BILIRUBINUR NEGATIVE 08/29/2016 0017    BILIRUBINUR Negative 02/19/2014 0745   KETONESUR NEGATIVE 08/29/2016 0017   PROTEINUR NEGATIVE 08/29/2016 0017   UROBILINOGEN 1.0 09/02/2014 1531   NITRITE NEGATIVE 08/29/2016 0017   LEUKOCYTESUR TRACE (A) 08/29/2016 0017   LEUKOCYTESUR Negative 02/19/2014 0745   Dg Chest 2 View  Result Date: 08/29/2016 CLINICAL DATA:  Cough.  Weakness on the left EXAM: CHEST  2 VIEW COMPARISON:  08/28/2016 FINDINGS: The heart is mildly enlarged. There is aortic atherosclerosis. There is a linear scar at the left base. Lungs are otherwise  clear. The vascularity is normal. No effusions. Chronic partial compression fracture lower thoracic spine. IMPRESSION: No active disease. Mild cardiomegaly. Aortic atherosclerosis. Left base scar. Electronically Signed   By: Nelson Chimes M.D.   On: 08/29/2016 13:58   Dg Chest Portable 1 View  Result Date: 08/28/2016 CLINICAL DATA:  Initial evaluation for acute shortness of breath, question possible pneumonia. EXAM: PORTABLE CHEST 1 VIEW COMPARISON:  Prior radiograph from 11/10/2015. FINDINGS: Mild cardiomegaly stable. Mediastinal silhouette within normal limits. Right rib bowing of the trach air column or on the aortic knob is stable. Lungs are hypoinflated. Mild patchy and linear left basilar opacity. Atelectasis is favored, although subtle developing pneumonia not entirely excluded. No other focal airspace disease. No pulmonary edema or pleural effusion. No pneumothorax. No acute osseous abnormality. Advanced degenerative changes of both shoulders. IMPRESSION: 1. Shallow lung inflation with mild patchy and linear left basilar opacity. Atelectasis is favored. Possible mild and/or early/developing pneumonia not entirely excluded. 2. Stable cardiomegaly without pulmonary edema. Electronically Signed   By: Jeannine Boga M.D.   On: 08/28/2016 23:55    Discharge Instructions: Discharge Instructions    Call MD for:  difficulty breathing, headache or visual disturbances     Complete by:  As directed    Call MD for:  extreme fatigue    Complete by:  As directed    Call MD for:  persistant dizziness or light-headedness    Complete by:  As directed    Call MD for:  persistant nausea and vomiting    Complete by:  As directed    Call MD for:  severe uncontrolled pain    Complete by:  As directed    Call MD for:  temperature >100.4    Complete by:  As directed    Diet - low sodium heart healthy    Complete by:  As directed    Discharge instructions    Complete by:  As directed    Please continue to take your medications as prescribed. We do not believe you have a pneumonia, but more likely a viral illness. Nonetheless we have provided a prescription for a short course of Azithromycin that you can take for the next few days to help reduce inflammation / help prevent bacterial pneumonia from developing.   You have likely developed urethral strictures due to chronic foley catheter trauma, this makes it difficult to replace urinary catheters and can lead to bleeding. Please follow up with Urology as soon as possible to have them evaluate and manage this.  We believe that you became sick secondary to bladder obstruction with potentially a clogged urinary catheter. We placed a foley catheter yesterday and removed over a liter of urine.  We noticed high blood sugar overnight (290s+) and you should see your primary care doctors soon and consider adjusting your diabetes medications. High blood sugars puts you at risk for infections, like the one you had on your right thigh.   If you develop a fever, nausea/vomiting, cough that becomes productive of green sputum, shortness of breath, or other concerning symptoms please visit our acute care clinic.   Increase activity slowly    Complete by:  As directed       Signed: Asencion Partridge, MD 08/29/2016, 3:16 PM   Pager: [phone]

## 2016-08-29 NOTE — Care Management CC44 (Signed)
Condition Code 44 Documentation Completed  Patient Details  Name: [name] MRN: [id number] Date of Birth: [birth year]   Condition Code 44 given:  Yes Patient signature on Condition Code 44 notice:  Yes Documentation of 2 MD's agreement:  Yes Code 44 added to claim:  Yes    Carles Collet, RN 08/29/2016, 4:09 PM

## 2016-08-29 NOTE — Progress Notes (Signed)
Received culture report from [facility] and rehab center that verifies MRSA positive culture from right thigh on 08/16/2016. Patient on contact precautions appropriately.

## 2016-08-29 NOTE — Progress Notes (Signed)
Patient will DC to: [facility] Anticipated DC date: 08/29/16 Family notified: Spouse Transport by: Corey Harold   Per MD patient ready for DC to [facility]. RN, patient, patient's family, and facility notified of DC. Discharge Summary sent to facility. RN given number for report. DC packet on chart. Ambulance transport requested for patient.   CSW signing off.  Cedric Fishman, [place] Social Worker [phone]

## 2016-08-29 NOTE — ED Notes (Signed)
65mL NS administered through continuous bladder irrigation. Urine output=1447mL. EDP aware. Urine clear at this time, irrigation system clamped.

## 2016-08-29 NOTE — Progress Notes (Signed)
Last minute discharge placement back to [facility]. CSW obtained insurance authorization to return to [facility] today. Patient's wife at bedside understands discharge process and would like [facility]AR for transport.  Percell Locus Yassine Brunsman LCSWA [phone]

## 2016-08-30 ENCOUNTER — Non-Acute Institutional Stay (SKILLED_NURSING_FACILITY): Payer: PPO | Admitting: Internal Medicine

## 2016-08-30 ENCOUNTER — Encounter: Payer: Self-pay | Admitting: Internal Medicine

## 2016-08-30 DIAGNOSIS — E119 Type 2 diabetes mellitus without complications: Secondary | ICD-10-CM | POA: Diagnosis not present

## 2016-08-30 DIAGNOSIS — I639 Cerebral infarction, unspecified: Secondary | ICD-10-CM

## 2016-08-30 DIAGNOSIS — J189 Pneumonia, unspecified organism: Secondary | ICD-10-CM

## 2016-08-30 DIAGNOSIS — R339 Retention of urine, unspecified: Secondary | ICD-10-CM

## 2016-08-30 DIAGNOSIS — F0150 Vascular dementia without behavioral disturbance: Secondary | ICD-10-CM | POA: Diagnosis not present

## 2016-08-30 DIAGNOSIS — G8192 Hemiplegia, unspecified affecting left dominant side: Secondary | ICD-10-CM

## 2016-08-30 DIAGNOSIS — F4323 Adjustment disorder with mixed anxiety and depressed mood: Secondary | ICD-10-CM

## 2016-08-30 DIAGNOSIS — N319 Neuromuscular dysfunction of bladder, unspecified: Secondary | ICD-10-CM

## 2016-08-30 DIAGNOSIS — N179 Acute kidney failure, unspecified: Secondary | ICD-10-CM | POA: Diagnosis not present

## 2016-08-30 DIAGNOSIS — IMO0002 Reserved for concepts with insufficient information to code with codable children: Secondary | ICD-10-CM

## 2016-08-30 DIAGNOSIS — N3941 Urge incontinence: Secondary | ICD-10-CM | POA: Diagnosis not present

## 2016-08-30 DIAGNOSIS — I10 Essential (primary) hypertension: Secondary | ICD-10-CM

## 2016-08-30 NOTE — Progress Notes (Signed)
Patient ID: [name], male   DOB: May 07, 1941, 75 y.o.   MRN: [id number]    DATE:  08/30/2016  Location:    [place] Room Number: 048 A Place of Service: SNF (31)   Extended Emergency Contact Information Primary Emergency Contact: Wilkowski,[name] Address: [address]          [place] [address] [place] Phone: [phone] Relation: Spouse Secondary Emergency Contact: Cott,[name]  [place] Relation: Daughter  Advanced Directive information Does patient have an advance directive?: Yes, Type of Advance Directive: Out of facility DNR (pink MOST or yellow form), Does patient want to make changes to advanced directive?: No - Patient declined  Chief Complaint  Patient presents with  . Follow-up    HPI:  75 yo male long term resident seen today for hodpital f/u of urinary retention 2/2 neurogenic bladder, cough, AKI, with hx vascular dementia, HTN, right inner thigh wound (+) MRSA, DM, depression 2/2 CVA, edema and hyperlipidemia. ED placed a 3 way cath and flushed with 600cc NS--> pt voided 1.5 L. UA (+) hematuria but no UTI. Cr 2.07. CXR showed no acute process. He rec'd IV vanco/cefepime--> rocephin/azithromycin -->po azithromycin which he will need to take a total of 5 days. He spent <24 hrs in hospital  Today he has no concerns. CBG 204. No f/c. He is a poor historian due to dementia. Hx obtained from chart. He was started on levaquin by NP 2 days ago for HCAP.   Hx CVA with left hemiparesis -  Stable on plavix 75 mg daily  Hypertension - takes coreg 12.5 mg twice daily and lisinopril 40 mg daily. BP is elevated today   DM - gets novolog SSI: 60-150=3units; 151-400=5 units   Vascular dementia - stable on aricept 10 mg nightly and namdena xr 28 mg daily   Hyperlipidemia - stable on zocor 20 mg daily   Urine retention - due to neurogenic bladder; foley dependent. takes flomax 0.4 mg daily; ditropan 5 mg twice  daily and myrbetriq 50 mg daily   Adjustment disorder/depression - stable on zoloft 25 mg daily    Past Medical History:  Diagnosis Date  . Arthritis    "maybe"  . 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 CHF (congestive heart failure) ([place])   . Depression   . Hyperlipemia   . Hypertension   . Sleep apnea    "my machine [place]; got to get me another one" (09/01/2014)  . Stroke [facility]) 2009   left thalamic infarct, residual LUE weakness  . TIA (transient ischemic attack)    multiple in past, most recently in 2011  . Type II diabetes mellitus ([place])     Past Surgical History:  Procedure Laterality Date  . CARDIAC CATHETERIZATION  07/2010  . CRANIOTOMY  05/2008   for clipping of an incidental asymptomatic unruptured/notes 02/13/2011  . KNEE ARTHROSCOPY Left   . 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 SHOULDER ARTHROSCOPY W/ ROTATOR CUFF REPAIR Right     Patient Care Team: Aldine Contes, MD as PCP - General (Internal Medicine) Webb Laws, OD as Referring Physician (Optometry) Bjorn Loser, MD as Consulting Physician (Urology) Hayden Pedro, MD as Consulting Physician (Ophthalmology) Charlsie Merles, MD as Referring Physician (Internal Medicine)  Social History   Social History  . Marital status: Married  Spouse name: [name]  . Number of children: 3  . Years of education: HS   Occupational History  . disabled    Social History Main Topics  . Smoking status: Former Smoker    Packs/day: 1.50    Years: 15.00    Types: Cigarettes    Quit date: 10/16/1983  . Smokeless tobacco: Never Used  . Alcohol use No  . Drug use: No  . Sexual activity: No   Other Topics Concern  . Not on file   Social History Narrative   Pt lives at home with spouse.   Caffeine Use: 1 cup daily     reports that he quit smoking about 32 years ago. His  smoking use included Cigarettes. He has a 22.50 pack-year smoking history. He has never used smokeless tobacco. He reports that he does not drink alcohol or use drugs.  Family History  Problem Relation Age of Onset  . Stroke Mother   . Pneumonia Father   . Diabetes Sister   . Diabetes Brother   . Diabetes Paternal Aunt   . Colon cancer Neg Hx    Family Status  Relation Status  . Mother Deceased at age 64  . Father Deceased at age 39  . Sister   . Brother   . Paternal Aunt   . Neg Hx     Immunization History  Administered Date(s) Administered  . Influenza Split 06/25/2012  . Influenza Whole 07/18/2007, 08/18/2008, 08/09/2009, 06/14/2010, 06/20/2011  . Influenza,inj,Quad PF,36+ Mos 06/24/2013, 06/08/2014  . PPD Test 04/25/2016, 06/29/2016, 07/06/2016  . Pneumococcal Conjugate-13 01/06/2015  . Pneumococcal Polysaccharide-23 05/30/2008  . Tdap 06/20/2011    Allergies  Allergen Reactions  . Pioglitazone Other (See Comments)    Edema   . Rosiglitazone Maleate Other (See Comments)    edema    Medications: Patient's Medications  New Prescriptions   No medications on file  Previous Medications   AZITHROMYCIN (ZITHROMAX) 500 MG TABLET    Take 1 tablet (500 mg total) by mouth daily.   CARVEDILOL (COREG) 12.5 MG TABLET    Take 1 tablet (12.5 mg total) by mouth 2 (two) times daily.   CLOPIDOGREL (PLAVIX) 75 MG TABLET    Take 1 tablet by mouth at bedtime. On hold until 09/01/2016   DICLOFENAC SODIUM (VOLTAREN) 1 % GEL    Apply 4 g topically 4 (four) times daily.   DONEPEZIL (ARICEPT) 10 MG TABLET    Take 1 tablet (10 mg total) by mouth at bedtime.   HYPROMELLOSE (NATURAL BALANCE TEARS) 0.4 % SOLN    Place 2 drops into both eyes 3 (three) times daily.    INSULIN ASPART (NOVOLOG) 100 UNIT/ML INJECTION    Inject 0-5 Units into the skin 3 (three) times daily before meals. Inject as per sliding scale: 0-59=0 call MD if less than 60, 60-150=3 units 301-400=5 units call MD if >400     LEVOFLOXACIN (LEVAQUIN) 750 MG TABLET    Take 750 mg by mouth daily.   LISINOPRIL (PRINIVIL,ZESTRIL) 40 MG TABLET    Take 1 tablet (40 mg total) by mouth daily.   MEMANTINE (NAMENDA XR) 28 MG CP24 24 HR CAPSULE    Take 1 capsule (28 mg total) by mouth daily.   MIRABEGRON ER (MYRBETRIQ) 50 MG TB24 TABLET    Take 50 mg by mouth daily.   MUPIROCIN OINTMENT (BACTROBAN) 2 %    Apply 1 application topically 2 (two) times daily.   OXYBUTYNIN (DITROPAN) 5 MG TABLET  Take 5 mg by mouth 2 (two) times daily.   PHENAZOPYRIDINE (PYRIDIUM) 100 MG TABLET    Take 100 mg by mouth 3 (three) times daily as needed for pain.   POLYETHYL GLYCOL-PROPYL GLYCOL (SYSTANE) 0.4-0.3 % SOLN    Place 1 drop into both eyes 2 (two) times daily.   PREDNISOLONE ACETATE (PRED FORTE) 1 % OPHTHALMIC SUSPENSION    Place 1 drop into the left eye 4 (four) times daily.    SACCHAROMYCES BOULARDII (FLORASTOR) 250 MG CAPSULE    Take 250 mg by mouth 2 (two) times daily.   SERTRALINE (ZOLOFT) 25 MG TABLET    Take 1 tablet (25 mg total) by mouth daily.   SIMVASTATIN (ZOCOR) 20 MG TABLET    Take 1 tablet (20 mg total) by mouth at bedtime.   TAMSULOSIN (FLOMAX) 0.4 MG CAPS CAPSULE    Take 0.4 mg by mouth at bedtime.   TORSEMIDE (DEMADEX) 10 MG TABLET    Take 10 mg by mouth daily.  Modified Medications   No medications on file  Discontinued Medications   CLOPIDOGREL (PLAVIX) 75 MG TABLET    Take 1 tablet (75 mg total) by mouth daily.    Review of Systems  Unable to perform ROS: Dementia    Vitals:   08/30/16 1128  BP: (!) 172/80  Pulse: 64  Resp: 17  Temp: (!) 101 F (38.3 C)  TempSrc: Oral  SpO2: 98%  Weight: 188 lb 9.6 oz (85.5 kg)   Body mass index is 29.54 kg/m.  Physical Exam  Constitutional: He appears well-developed.  Sitting up in bed in NAD, frail appearing  HENT:  Mouth/Throat: Oropharynx is clear and moist. No oropharyngeal exudate.  MMM; no oral thrush  Eyes: Pupils are equal, round, and reactive to light. No  scleral icterus.  Neck: Neck supple. Muscular tenderness present. No spinous process tenderness present. Carotid bruit is not present. Decreased range of motion (reduced right rotation) present.    Cardiovascular: Normal rate, regular rhythm and intact distal pulses.  Exam reveals no gallop and no friction rub.   Murmur (1/6 SEM) heard. Trace LE edema b/l. No calf TTP. TED stockings intact b/l  Pulmonary/Chest: Effort normal. No respiratory distress. He has wheezes (R>L end expiratory wheezing with prolonged expiratory phase). He has no rales. He exhibits no tenderness.  No rhonchi  Abdominal: Soft. Bowel sounds are normal. He exhibits no distension, no abdominal bruit, no pulsatile midline mass and no mass. There is no hepatomegaly. There is no tenderness. There is no rebound and no guarding.  Genitourinary:  Genitourinary Comments: Foley cath intact and DTG clear yellow urine  Musculoskeletal: He exhibits edema.  Lymphadenopathy:    He has no cervical adenopathy.  Neurological: He is alert.  Left hemiparesis with increased swelling  In LUE and LLE  Skin: Skin is warm and dry. No rash noted.  Psychiatric: He has a normal mood and affect. His behavior is normal.     Labs reviewed: Admission on 08/28/2016, Discharged on 08/29/2016  Component Date Value Ref Range Status  . Color, Urine 08/29/2016 STRAW* YELLOW Final  . APPearance 08/29/2016 CLEAR  CLEAR Final  . Specific Gravity, Urine 08/29/2016 1.005  1.005 - 1.030 Final  . pH 08/29/2016 5.0  5.0 - 8.0 Final  . Glucose, UA 08/29/2016 NEGATIVE  NEGATIVE mg/dL Final  . Hgb urine dipstick 08/29/2016 LARGE* NEGATIVE Final  . Bilirubin Urine 08/29/2016 NEGATIVE  NEGATIVE Final  . Ketones, ur 08/29/2016 NEGATIVE  NEGATIVE mg/dL Final  .  Protein, ur 08/29/2016 NEGATIVE  NEGATIVE mg/dL Final  . Nitrite 08/29/2016 NEGATIVE  NEGATIVE Final  . Leukocytes, UA 08/29/2016 TRACE* NEGATIVE Final  . Glucose-Capillary 08/28/2016 290* 65 - 99 mg/dL  Final  . Sodium 08/29/2016 138  135 - 145 mmol/L Final  . Potassium 08/29/2016 3.7  3.5 - 5.1 mmol/L Final  . Chloride 08/29/2016 97* 101 - 111 mmol/L Final  . BUN 08/29/2016 36* 6 - 20 mg/dL Final  . Creatinine, Ser 08/29/2016 2.00* 0.61 - 1.24 mg/dL Final  . Glucose, Bld 08/29/2016 310* 65 - 99 mg/dL Final  . Calcium, Ion 08/29/2016 1.15  1.15 - 1.40 mmol/L Final  . TCO2 08/29/2016 26  0 - 100 mmol/L Final  . Hemoglobin 08/29/2016 13.9  13.0 - 17.0 g/dL Final  . HCT 08/29/2016 41.0  39.0 - 52.0 % Final  . Sodium 08/29/2016 136  135 - 145 mmol/L Final  . Potassium 08/29/2016 3.7  3.5 - 5.1 mmol/L Final  . Chloride 08/29/2016 97* 101 - 111 mmol/L Final  . CO2 08/29/2016 26  22 - 32 mmol/L Final  . Glucose, Bld 08/29/2016 317* 65 - 99 mg/dL Final  . BUN 08/29/2016 35* 6 - 20 mg/dL Final  . Creatinine, Ser 08/29/2016 2.02* 0.61 - 1.24 mg/dL Final  . Calcium 08/29/2016 9.3  8.9 - 10.3 mg/dL Final  . Total Protein 08/29/2016 7.3  6.5 - 8.1 g/dL Final  . Albumin 08/29/2016 3.2* 3.5 - 5.0 g/dL Final  . AST 08/29/2016 28  15 - 41 U/L Final  . ALT 08/29/2016 29  17 - 63 U/L Final  . Alkaline Phosphatase 08/29/2016 75  38 - 126 U/L Final  . Total Bilirubin 08/29/2016 0.8  0.3 - 1.2 mg/dL Final  . GFR calc non Af Amer 08/29/2016 31* >60 mL/min Final  . GFR calc Af Amer 08/29/2016 36* >60 mL/min Final   Comment: (NOTE) The eGFR has been calculated using the CKD EPI equation. This calculation has not been validated in all clinical situations. eGFR's persistently <60 mL/min signify possible Chronic Kidney Disease.   . Anion gap 08/29/2016 13  5 - 15 Final  . WBC 08/29/2016 8.7  4.0 - 10.5 K/uL Final  . RBC 08/29/2016 5.04  4.22 - 5.81 MIL/uL Final  . Hemoglobin 08/29/2016 12.4* 13.0 - 17.0 g/dL Final  . HCT 08/29/2016 39.7  39.0 - 52.0 % Final  . MCV 08/29/2016 78.8  78.0 - 100.0 fL Final  . MCH 08/29/2016 24.6* 26.0 - 34.0 pg Final  . MCHC 08/29/2016 31.2  30.0 - 36.0 g/dL Final  . RDW  08/29/2016 16.3* 11.5 - 15.5 % Final  . Platelets 08/29/2016 197  150 - 400 K/uL Final  . Neutrophils Relative % 08/29/2016 85  % Final  . Neutro Abs 08/29/2016 7.4  1.7 - 7.7 K/uL Final  . Lymphocytes Relative 08/29/2016 11  % Final  . Lymphs Abs 08/29/2016 1.0  0.7 - 4.0 K/uL Final  . Monocytes Relative 08/29/2016 4  % Final  . Monocytes Absolute 08/29/2016 0.3  0.1 - 1.0 K/uL Final  . Eosinophils Relative 08/29/2016 0  % Final  . Eosinophils Absolute 08/29/2016 0.0  0.0 - 0.7 K/uL Final  . Basophils Relative 08/29/2016 0  % Final  . Basophils Absolute 08/29/2016 0.0  0.0 - 0.1 K/uL Final  . Squamous Epithelial / LPF 08/29/2016 0-5* NONE SEEN Final  . WBC, UA 08/29/2016 0-5  0 - 5 WBC/hpf Final  . RBC / HPF 08/29/2016 TOO NUMEROUS  TO COUNT  0 - 5 RBC/hpf Final  . Bacteria, UA 08/29/2016 FEW* NONE SEEN Final  . WBC 08/29/2016 9.4  4.0 - 10.5 K/uL Final  . RBC 08/29/2016 4.98  4.22 - 5.81 MIL/uL Final  . Hemoglobin 08/29/2016 12.4* 13.0 - 17.0 g/dL Final  . HCT 08/29/2016 39.4  39.0 - 52.0 % Final  . MCV 08/29/2016 79.1  78.0 - 100.0 fL Final  . MCH 08/29/2016 24.9* 26.0 - 34.0 pg Final  . MCHC 08/29/2016 31.5  30.0 - 36.0 g/dL Final  . RDW 08/29/2016 16.6* 11.5 - 15.5 % Final  . Platelets 08/29/2016 192  150 - 400 K/uL Final  . Sodium 08/29/2016 136  135 - 145 mmol/L Final  . Potassium 08/29/2016 4.0  3.5 - 5.1 mmol/L Final  . Chloride 08/29/2016 96* 101 - 111 mmol/L Final  . CO2 08/29/2016 29  22 - 32 mmol/L Final  . Glucose, Bld 08/29/2016 296* 65 - 99 mg/dL Final  . BUN 08/29/2016 38* 6 - 20 mg/dL Final  . Creatinine, Ser 08/29/2016 2.07* 0.61 - 1.24 mg/dL Final  . Calcium 08/29/2016 9.4  8.9 - 10.3 mg/dL Final  . GFR calc non Af Amer 08/29/2016 30* >60 mL/min Final  . GFR calc Af Amer 08/29/2016 35* >60 mL/min Final   Comment: (NOTE) The eGFR has been calculated using the CKD EPI equation. This calculation has not been validated in all clinical situations. eGFR's  persistently <60 mL/min signify possible Chronic Kidney Disease.   . Anion gap 08/29/2016 11  5 - 15 Final  . Procalcitonin 08/29/2016 1.30  ng/mL Final   Comment:        Interpretation: PCT > 0.5 ng/mL and <= 2 ng/mL: Systemic infection (sepsis) is possible, but other conditions are known to elevate PCT as well. (NOTE)         ICU PCT Algorithm               Non ICU PCT Algorithm    ----------------------------     ------------------------------         PCT < 0.25 ng/mL                 PCT < 0.1 ng/mL     Stopping of antibiotics            Stopping of antibiotics       strongly encouraged.               strongly encouraged.    ----------------------------     ------------------------------       PCT level decrease by               PCT < 0.25 ng/mL       >= 80% from peak PCT       OR PCT 0.25 - 0.5 ng/mL          Stopping of antibiotics                                             encouraged.     Stopping of antibiotics           encouraged.    ----------------------------     ------------------------------       PCT level decrease by              PCT >= 0.25 ng/mL       <  80% from peak PCT        AND PCT >= 0.5 ng/mL                                      Continuing antibiotics                                              encouraged.       Continuing antibiotics            encouraged.    ----------------------------     ------------------------------     PCT level increase compared          PCT > 0.5 ng/mL         with peak PCT AND          PCT >= 0.5 ng/mL             Escalation of antibiotics                                          strongly encouraged.      Escalation of antibiotics        strongly encouraged.   . Influenza A By PCR 08/29/2016 NEGATIVE  NEGATIVE Final  . Influenza B By PCR 08/29/2016 NEGATIVE  NEGATIVE Final   Comment: (NOTE) The Xpert Xpress Flu assay is intended as an aid in the diagnosis of  influenza and should not be used as a sole basis for  treatment.  This  assay is FDA approved for nasopharyngeal swab specimens only. Nasal  washings and aspirates are unacceptable for Xpert Xpress Flu testing.   Marland Kitchen MRSA by PCR 08/29/2016 NEGATIVE  NEGATIVE Final   Comment:        The GeneXpert MRSA Assay (FDA approved for NASAL specimens only), is one component of a comprehensive MRSA colonization surveillance program. It is not intended to diagnose MRSA infection nor to guide or monitor treatment for MRSA infections.   . Glucose-Capillary 08/29/2016 248* 65 - 99 mg/dL Final  . Lactic Acid, Venous 08/29/2016 1.27  0.5 - 1.9 mmol/L Final  . Glucose-Capillary 08/29/2016 213* 65 - 99 mg/dL Final  . Glucose-Capillary 08/29/2016 207* 65 - 99 mg/dL Final  Nursing Home on 08/28/2016  Component Date Value Ref Range Status  . Glucose 08/27/2016 205  mg/dL Final  . BUN 08/27/2016 16  4 - 21 mg/dL Final  . Creatinine 08/27/2016 1.1  0.6 - 1.3 mg/dL Final  . Potassium 08/27/2016 3.9  3.4 - 5.3 mmol/L Final  . Sodium 08/27/2016 142  137 - 147 mmol/L Final  Nursing Home on 07/12/2016  Component Date Value Ref Range Status  . Hemoglobin 06/09/2016 10.8* 13.5 - 17.5 g/dL Final  . HCT 06/09/2016 34* 41 - 53 % Final  . Platelets 06/09/2016 313  150 - 399 K/L Final  . WBC 06/09/2016 8.2  10^3/mL Final  . Glucose 06/09/2016 227  mg/dL Final  . BUN 06/09/2016 20  4 - 21 mg/dL Final  . Creatinine 06/09/2016 1.1  0.6 - 1.3 mg/dL Final  . Potassium 06/09/2016 4.2  3.4 - 5.3 mmol/L Final  . Sodium 06/09/2016 148* 137 - 147 mmol/L Final  .  Alkaline Phosphatase 06/09/2016 81  25 - 125 U/L Final  . ALT 06/09/2016 20  10 - 40 U/L Final  . AST 06/09/2016 12* 14 - 40 U/L Final  . Bilirubin, Total 06/09/2016 0.3  mg/dL Final    Dg Chest 2 View  Result Date: 08/29/2016 CLINICAL DATA:  Cough.  Weakness on the left EXAM: CHEST  2 VIEW COMPARISON:  08/28/2016 FINDINGS: The heart is mildly enlarged. There is aortic atherosclerosis. There is a linear scar at  the left base. Lungs are otherwise clear. The vascularity is normal. No effusions. Chronic partial compression fracture lower thoracic spine. IMPRESSION: No active disease. Mild cardiomegaly. Aortic atherosclerosis. Left base scar. Electronically Signed   By: Nelson Chimes M.D.   On: 08/29/2016 13:58   Dg Chest Portable 1 View  Result Date: 08/28/2016 CLINICAL DATA:  Initial evaluation for acute shortness of breath, question possible pneumonia. EXAM: PORTABLE CHEST 1 VIEW COMPARISON:  Prior radiograph from 11/10/2015. FINDINGS: Mild cardiomegaly stable. Mediastinal silhouette within normal limits. Right rib bowing of the trach air column or on the aortic knob is stable. Lungs are hypoinflated. Mild patchy and linear left basilar opacity. Atelectasis is favored, although subtle developing pneumonia not entirely excluded. No other focal airspace disease. No pulmonary edema or pleural effusion. No pneumothorax. No acute osseous abnormality. Advanced degenerative changes of both shoulders. IMPRESSION: 1. Shallow lung inflation with mild patchy and linear left basilar opacity. Atelectasis is favored. Possible mild and/or early/developing pneumonia not entirely excluded. 2. Stable cardiomegaly without pulmonary edema. Electronically Signed   By: Jeannine Boga M.D.   On: 08/28/2016 23:55     Assessment/Plan   ICD-9-CM ICD-10-CM   1. HCAP (healthcare-associated pneumonia) 15 J18.9   2. Urinary retention 788.20 R33.9   3. Neurogenic bladder 596.54 N31.9    foley cath intact  4. AKI (acute kidney injury) ([place]) 584.9 N17.9   5. Essential hypertension 401.9 I10   6. Vascular dementia without behavioral disturbance 290.40 F01.50   7. Hemiparesis of left dominant side due to cerebral infarction (HCC) 438.21 I63.9     G81.92   8. Adjustment disorder with mixed anxiety and depressed mood 309.28 F43.23   9. Controlled type 2 diabetes mellitus without complication, without long-term current use of insulin  (HCC) 250.00 E11.9     Check cbc and bmp  Finish azithromycin (will need total of 5 days tx)  D/c levaquin which he was taking for HCAP prior to hospital admission  May use tylenol prn fever  F/u with urology for urinary retention  Start Nebs TID x 7days -->BID-->daily and stop  cont other meds as ordered  Foley cath care as indicated  PT/OT/ST as indicated  CBGs as ordered  Will follow  Nubia Ziesmer S. Perlie Gold  [facility] and Adult Medicine [address] [place], [place] [address] [phone] Cell (Monday-Friday 8 AM - 5 PM) [phone] After 5 PM and follow prompts

## 2016-08-31 DIAGNOSIS — L03115 Cellulitis of right lower limb: Secondary | ICD-10-CM | POA: Diagnosis not present

## 2016-08-31 LAB — URINE CULTURE: Culture: NO GROWTH

## 2016-09-02 DIAGNOSIS — IMO0002 Reserved for concepts with insufficient information to code with codable children: Secondary | ICD-10-CM | POA: Insufficient documentation

## 2016-09-11 NOTE — Progress Notes (Signed)
   08/29/16 1327  PT Time Calculation  PT Start Time (ACUTE ONLY) 1218  PT Stop Time (ACUTE ONLY) 1235  PT Time Calculation (min) (ACUTE ONLY) 17 min  PT G-Codes **NOT FOR INPATIENT CLASS**  Functional Assessment Tool Used clinical judgement  Functional Limitation Mobility: Walking and moving around  Mobility: Walking and Moving Around Current Status [id number]) CN  Mobility: Walking and Moving Around Goal Status [id number]) CL  PT General Charges  $$ ACUTE PT VISIT 1 Procedure  PT Evaluation  $PT Eval Low Complexity 1 Procedure  Tresa Endo PT [phone]

## 2016-09-12 ENCOUNTER — Encounter: Payer: Self-pay | Admitting: Internal Medicine

## 2016-09-12 ENCOUNTER — Encounter: Payer: Self-pay | Admitting: Licensed Clinical Social Worker

## 2016-09-12 ENCOUNTER — Ambulatory Visit (INDEPENDENT_AMBULATORY_CARE_PROVIDER_SITE_OTHER): Payer: PPO | Admitting: Internal Medicine

## 2016-09-12 VITALS — BP 157/65 | HR 55 | Temp 98.5°F | Ht 67.0 in | Wt 187.7 lb

## 2016-09-12 DIAGNOSIS — E1165 Type 2 diabetes mellitus with hyperglycemia: Secondary | ICD-10-CM

## 2016-09-12 DIAGNOSIS — R059 Cough, unspecified: Secondary | ICD-10-CM

## 2016-09-12 DIAGNOSIS — Z794 Long term (current) use of insulin: Secondary | ICD-10-CM | POA: Diagnosis not present

## 2016-09-12 DIAGNOSIS — E1149 Type 2 diabetes mellitus with other diabetic neurological complication: Secondary | ICD-10-CM | POA: Diagnosis not present

## 2016-09-12 DIAGNOSIS — Z872 Personal history of diseases of the skin and subcutaneous tissue: Secondary | ICD-10-CM | POA: Diagnosis not present

## 2016-09-12 DIAGNOSIS — Z09 Encounter for follow-up examination after completed treatment for conditions other than malignant neoplasm: Secondary | ICD-10-CM | POA: Diagnosis not present

## 2016-09-12 DIAGNOSIS — Z87448 Personal history of other diseases of urinary system: Secondary | ICD-10-CM | POA: Diagnosis not present

## 2016-09-12 DIAGNOSIS — Z960 Presence of urogenital implants: Secondary | ICD-10-CM

## 2016-09-12 DIAGNOSIS — S81801A Unspecified open wound, right lower leg, initial encounter: Secondary | ICD-10-CM | POA: Insufficient documentation

## 2016-09-12 DIAGNOSIS — Z8614 Personal history of Methicillin resistant Staphylococcus aureus infection: Secondary | ICD-10-CM | POA: Diagnosis not present

## 2016-09-12 DIAGNOSIS — N179 Acute kidney failure, unspecified: Secondary | ICD-10-CM

## 2016-09-12 DIAGNOSIS — S81801D Unspecified open wound, right lower leg, subsequent encounter: Secondary | ICD-10-CM

## 2016-09-12 DIAGNOSIS — R339 Retention of urine, unspecified: Secondary | ICD-10-CM

## 2016-09-12 DIAGNOSIS — R05 Cough: Secondary | ICD-10-CM

## 2016-09-12 LAB — GLUCOSE, CAPILLARY: GLUCOSE-CAPILLARY: 200 mg/dL — AB (ref 65–99)

## 2016-09-12 LAB — POCT GLYCOSYLATED HEMOGLOBIN (HGB A1C): HEMOGLOBIN A1C: 8.3

## 2016-09-12 MED ORDER — INSULIN GLARGINE 100 UNIT/ML ~~LOC~~ SOLN: 10.0000 [IU] | Freq: Every day | SUBCUTANEOUS | 11 refills | Status: DC

## 2016-09-12 NOTE — Assessment & Plan Note (Signed)
His right inner thigh wound has completely healed. No further dressings needed.

## 2016-09-12 NOTE — Progress Notes (Signed)
CSW met with pt's spouse, [name] prior to [name]'s appointment.  Spouse states she is unsatisfied with the level of care [name] is receiving at facility.  Spouse inquiring about the steps to transfer to another facility.   Spouse states pt continues to have [facility] as primary.  CSW provided [name] listing of [facility] facilities along with comparison chart from [email or web address].  Spouse encouraged to continue visiting facilities and inquire about a LTC waiting list for potential placement options.  CSW discussed role of LTC Ombudsman and program contact information provided for additional assistance.  [name] also inquired about [place] facilities.  CSW provided spouse with information on [facility].  Spouse provided with the option of PACE services as an alternative option to placement.  Spouse notified facility to facility transfers are held by the facility staff and [facility] will be available to assist with additional paperwork needs.

## 2016-09-12 NOTE — Assessment & Plan Note (Signed)
Cr 2.07 during hospitalization secondary to urinary retention from a blocked foley catheter. Will check bmet today to make sure this is resolving. His baseline Cr is 1.1-1.3.

## 2016-09-12 NOTE — Assessment & Plan Note (Signed)
His urinary retention has completely resolved after insertion of the new foley catheter. Denies any current issues. He followed up with Urology shortly after hospitalization and will return as needed. I did notice he is on both Flomax and Oxybutynin. These medications may be counteracting each other. I suggested he discuss this with his Urologist next time he has an appointment.

## 2016-09-12 NOTE — Assessment & Plan Note (Signed)
Has completely resolved. He completed a 5 day course of Azithromycin.

## 2016-09-12 NOTE — Patient Instructions (Signed)
General Instructions: - Make sure to follow up with Urology. Please ask the Urologist if you should be on both Flomax and Oxybutynin - Start Lantus 10 units daily at bedtime for your diabetes - Please let us know if you have any low blood sugars  Please bring your medicines with you each time you come to clinic.  Medicines may include prescription medications, over-the-counter medications, herbal remedies, eye drops, vitamins, or other pills.   Progress Toward Treatment Goals:  Treatment Goal 03/20/2016  Hemoglobin A1C at goal  Blood pressure -  Prevent falls -    Self Care Goals & Plans:  Self Care Goal 04/25/2016  Manage my medications take my medicines as prescribed; bring my medications to every visit; refill my medications on time  Monitor my health keep track of my blood glucose; bring my glucose meter and log to each visit  Eat healthy foods eat more vegetables; eat foods that are low in salt; eat baked foods instead of fried foods; eat fruit for snacks and desserts  Be physically active find an activity I enjoy  Meeting treatment goals -    Home Blood Glucose Monitoring 07/22/2014  Check my blood sugar once a day  When to check my blood sugar before breakfast     Care Management & Community Referrals:  Referral 09/06/2014  Referrals made for care management support none needed

## 2016-09-12 NOTE — Progress Notes (Signed)
CC: Hospital follow up for urinary retention  HPI:  [name] is a 75 y.o. man with PMHx as noted below who presents today for hospital follow up for urinary retention.  Urinary Retention: He was hospitalized from 11/14-11/15, presenting from SNF after the facility tried to replace his chronic foley catheter and no UOP was obtained. Patient has a chronic foley catheter due to a neurogenic bladder. There had been attempt to replace the foley by the facility and gross bloody discharge was obtained without urine output. In the ED a 3-way catheter was placed and over the next 24 hours 1.5 L of urine was obtained that was initially bloody but then became clear. There was suspicion that a stricture could have formed causing the difficulty of inserting the new foley. Urine cx from 11/15 was negative for infection.  Today, he reports no urinary issues. He followed up with Urology shortly after hospitalization and was told to follow up as needed or if any problems arise with the foley catheter again. He denies any hematuria.   AKI: Patient did have an AKI with Cr 2.07 and baseline 1.1-1.3 during hospitalization. Denies any urinary issues as above.  Cough: During hospitalization he had also complained of a cough, fevers, decreased appetite, and myalgias. CXR was negative for pneumonia. He received IV antibiotics and then was transitioned to 5 days PO Azithromycin which he completed. He reports no reoccurrence of his cough. Denies fevers, shortness of breath, congestion, or sore throat. He is getting breathing treatments at the SNF and states these help.   Type 2 DM: Last A1c 6.0 in Oct 2016, today A1c is 8.3.He is currently on a Novolog sliding scale regimen. Patient reports he used to be on Lantus but then was taken off of this due to weight loss. Per review of SNF records, blood sugars are mainly in the 200s-250s throughout the day. AM blood sugars in the 160s-230s, lunch time in the 220-250s,  and evening blood sugars in the 200s-300s. Denies any hypoglycemia episodes. He is currently getting about 3-5 units of Novolog three times daily with meals.   Right Inner Thigh Wound: He was noted to have a small on his right inner thigh while at the SNF before hospitalization. 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 While in the hospital wound care evaluated it and recommended cleansing with normal saline, patting dry, and applying Xeroform gauze, pink foam dressing, and to change this daily. He reports this is being done at the SNF. He denies any purulent drainage or pain. He denies any fevers.   Past Medical History:  Diagnosis Date  . Arthritis    "maybe"  . 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 CHF (congestive heart failure) ([place])   . Depression   . Hyperlipemia   . Hypertension   . Sleep apnea    "my machine [place]; got to get me another one" (09/01/2014)  . Stroke [facility]) 2009   left thalamic infarct, residual LUE weakness  . TIA (transient ischemic attack)    multiple in past, most recently in 2011  . Type II diabetes mellitus ([place])     Review of Systems:  All negative except per HPI  Physical Exam:  Vitals:   09/12/16 1110  BP: (!) 157/65  Pulse: (!) 55  Temp: 98.5 F (36.9 C)  TempSrc: Oral  SpO2: 99%  Weight: 187 lb 11.2 oz (85.1 kg)  Height: 5\' 7"  (1.702 m)   General: elderly man sitting up, NAD HEENT: [place]/AT, EOMI, sclera anicteric, mucus membranes moist CV: RRR, no m/g/r Pulm: CTA bilaterally, breaths non-labored Ext: warm, no edema Neuro: alert and oriented x 3, speech slow and mildly dysarthric- chronic from old stroke Skin: Completely healed small wound on the inner right thigh. No surrounding erythema, swelling, or tenderness.  Assessment & Plan:   See Encounters Tab for problem based charting.  Patient  discussed with Dr. Angelia Mould

## 2016-09-12 NOTE — Assessment & Plan Note (Signed)
Blood sugars elevated overall, especially in the evening. His A1c has increased from 6.0 to 8.3. Will add on Lantus 10 units QHS to see if we can get his blood sugars in good range. Continue Novolog 3-5 units TID with meals. Will have him follow up in 2 weeks.

## 2016-09-13 LAB — BMP8+ANION GAP
ANION GAP: 16 mmol/L (ref 10.0–18.0)
BUN/Creatinine Ratio: 14 (ref 10–24)
BUN: 14 mg/dL (ref 8–27)
CALCIUM: 9.4 mg/dL (ref 8.6–10.2)
CO2: 29 mmol/L (ref 18–29)
CREATININE: 1.03 mg/dL (ref 0.76–1.27)
Chloride: 101 mmol/L (ref 96–106)
GFR calc non Af Amer: 72 mL/min/{1.73_m2} (ref >59)
GFR, EST AFRICAN AMERICAN: 83 mL/min/{1.73_m2} (ref >59)
Glucose: 208 mg/dL — ABNORMAL HIGH (ref 65–99)
Potassium: 3.9 mmol/L (ref 3.5–5.2)
Sodium: 146 mmol/L — ABNORMAL HIGH (ref 134–144)

## 2016-09-14 DIAGNOSIS — F0150 Vascular dementia without behavioral disturbance: Secondary | ICD-10-CM | POA: Diagnosis not present

## 2016-09-14 DIAGNOSIS — I639 Cerebral infarction, unspecified: Secondary | ICD-10-CM | POA: Diagnosis not present

## 2016-09-14 DIAGNOSIS — G4733 Obstructive sleep apnea (adult) (pediatric): Secondary | ICD-10-CM | POA: Diagnosis not present

## 2016-09-14 DIAGNOSIS — A4902 Methicillin resistant Staphylococcus aureus infection, unspecified site: Secondary | ICD-10-CM | POA: Diagnosis not present

## 2016-09-14 DIAGNOSIS — N319 Neuromuscular dysfunction of bladder, unspecified: Secondary | ICD-10-CM | POA: Diagnosis not present

## 2016-09-14 DIAGNOSIS — E119 Type 2 diabetes mellitus without complications: Secondary | ICD-10-CM | POA: Diagnosis not present

## 2016-09-14 DIAGNOSIS — F329 Major depressive disorder, single episode, unspecified: Secondary | ICD-10-CM | POA: Diagnosis not present

## 2016-09-14 DIAGNOSIS — R531 Weakness: Secondary | ICD-10-CM | POA: Diagnosis not present

## 2016-09-14 DIAGNOSIS — I10 Essential (primary) hypertension: Secondary | ICD-10-CM | POA: Diagnosis not present

## 2016-09-18 NOTE — Progress Notes (Signed)
Internal Medicine Clinic Attending  Case discussed with Dr. Rivet at the time of the visit.  We reviewed the resident's history and exam and pertinent patient test results.  I agree with the assessment, diagnosis, and plan of care documented in the resident's note.  

## 2016-09-19 DIAGNOSIS — R310 Gross hematuria: Secondary | ICD-10-CM | POA: Diagnosis not present

## 2016-09-24 DIAGNOSIS — I639 Cerebral infarction, unspecified: Secondary | ICD-10-CM | POA: Diagnosis not present

## 2016-09-25 ENCOUNTER — Encounter: Payer: Self-pay | Admitting: Internal Medicine

## 2016-09-25 ENCOUNTER — Non-Acute Institutional Stay (SKILLED_NURSING_FACILITY): Payer: PPO | Admitting: Internal Medicine

## 2016-09-25 DIAGNOSIS — I6350 Cerebral infarction due to unspecified occlusion or stenosis of unspecified cerebral artery: Secondary | ICD-10-CM

## 2016-09-25 DIAGNOSIS — N319 Neuromuscular dysfunction of bladder, unspecified: Secondary | ICD-10-CM

## 2016-09-25 DIAGNOSIS — I639 Cerebral infarction, unspecified: Secondary | ICD-10-CM | POA: Diagnosis not present

## 2016-09-25 DIAGNOSIS — F0150 Vascular dementia without behavioral disturbance: Secondary | ICD-10-CM

## 2016-09-25 DIAGNOSIS — F0631 Mood disorder due to known physiological condition with depressive features: Secondary | ICD-10-CM

## 2016-09-25 DIAGNOSIS — I10 Essential (primary) hypertension: Secondary | ICD-10-CM | POA: Diagnosis not present

## 2016-09-25 DIAGNOSIS — IMO0002 Reserved for concepts with insufficient information to code with codable children: Secondary | ICD-10-CM

## 2016-09-25 NOTE — Progress Notes (Signed)
Patient ID: [name], male   DOB: 1941-08-02, 75 y.o.   MRN: [id number]   Location:   [place] Room Number: [id number] Place of Service:  SNF (31) Provider:  Granville Lewis, PA-C  Aldine Contes, MD  Patient Care Team: Aldine Contes, MD as PCP - General (Internal Medicine) Webb Laws, OD as Referring Physician (Optometry) Bjorn Loser, MD as Consulting Physician (Urology) Hayden Pedro, MD as Consulting Physician (Ophthalmology) Charlsie Merles, MD as Referring Physician (Internal Medicine)  Extended Emergency Contact Information Primary Emergency Contact: Henry,[name] Address: [address]          [place] [address] [place] Phone: [phone] Relation: Spouse Secondary Emergency Contact: Lamar,[name]  [place] Relation: Daughter  Code Status:  DNR Goals of care: Advanced Directive information Advanced Directives 09/25/2016  Does Patient Have a Medical Advance Directive? Yes  Type of Advance Directive Out of facility DNR (pink MOST or yellow form)  Does patient want to make changes to medical advance directive? -  Copy of [place] in Chart? -  Pre-existing out of facility DNR order (yellow form or pink MOST form) -  Some encounter information is confidential and restricted. Go to Review Flowsheets activity to see all data.     Chief complaint-acute visit secondary to hypertension-follow-up of chronic medical conditions including urinary retention with history of neurogenic bladder-renal insufficiency-depression-dementia- HPI:  Pt is a 75 y.o. male seen today for medical management of chronic diseases. As noted above-also acute visit secondary to elevated blood pressure.  Patient has been running higher blood pressures recently-with listed readings often around 160-however when attempted manually today we got between [id number] systolically.  He continued to rest in bed  currently without any complaints EMS was actually called which rechecked him with their cuff and got 190/74-I did recheck this as well and got a similar reading.-Clinically he continued to deny any discomfort and appeared to be at baseline per serial exams  He continued to be stable clinically-he is on numerous medications including Prinivil 40 mg a day hydralazine 10 mg 3 times a day and Coreg 12.5 mg twice a day  Regards to other medical issues he does have a history of urinary retention with neurogenic bladder and chronic Foley catheter-he was recently hospitalized for new urinary retention this resolved with with emergent catheter placement in the ER-he is followed by urology He also had renal insufficiency with a creatinine of 2.07 which is up from his baseline in the low ones this was thought possibly secondary to urinary retention and recent course of Bactrim.  He also had a cough with fever thought likely viral. Given short-term antibiotics but these were discontinued.   Currently he has no complaints he is resting in bed comfortably.             Past Medical History:  Diagnosis Date  . Arthritis    "maybe"  . 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 CHF (congestive heart failure) ([place])   . Depression   . Hyperlipemia   . Hypertension   . Sleep apnea    "my machine [place]; got to get me another one" (09/01/2014)  . Stroke [facility]) 2009   left thalamic infarct, residual LUE weakness  . TIA (transient ischemic attack)    multiple in past, most recently in 2011  . Type II  diabetes mellitus ([place])    Past Surgical History:  Procedure Laterality Date  . CARDIAC CATHETERIZATION  07/2010  . CRANIOTOMY  05/2008   for clipping of an incidental asymptomatic unruptured/notes 02/13/2011  . KNEE ARTHROSCOPY Left   . 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  SHOULDER ARTHROSCOPY W/ ROTATOR CUFF REPAIR Right     Allergies  Allergen Reactions  . Pioglitazone Other (See Comments)    Edema   . Rosiglitazone Maleate Other (See Comments)    edema      Medication List       Accurate as of 09/25/16 12:17 PM. Always use your most recent med list.          carvedilol 12.5 MG tablet Commonly known as:  COREG Take 1 tablet (12.5 mg total) by mouth 2 (two) times daily.   clopidogrel 75 MG tablet Commonly known as:  PLAVIX Take 1 tablet by mouth at bedtime. On hold until 09/01/2016   diclofenac sodium 1 % Gel Commonly known as:  VOLTAREN Apply 4 g topically 4 (four) times daily.   donepezil 10 MG tablet Commonly known as:  ARICEPT Take 1 tablet (10 mg total) by mouth at bedtime.   hydrALAZINE 10 MG tablet Commonly known as:  APRESOLINE Take 10 mg by mouth 3 (three) times daily.   insulin aspart 100 UNIT/ML injection Commonly known as:  novoLOG Inject 0-5 Units into the skin 3 (three) times daily before meals. Inject as per sliding scale: 0-59=0 call MD if less than 60, 60-150=3 units 301-400=5 units call MD if >400   insulin glargine 100 UNIT/ML injection Commonly known as:  LANTUS Inject 0.1 mLs (10 Units total) into the skin at bedtime.   lisinopril 40 MG tablet Commonly known as:  PRINIVIL,ZESTRIL Take 1 tablet (40 mg total) by mouth daily.   memantine 28 MG Cp24 24 hr capsule Commonly known as:  NAMENDA XR Take 1 capsule (28 mg total) by mouth daily.   mirabegron ER 50 MG Tb24 tablet Commonly known as:  MYRBETRIQ Take 50 mg by mouth daily.   mupirocin ointment 2 % Commonly known as:  BACTROBAN Apply 1 application topically 2 (two) times daily.   NATURAL BALANCE TEARS 0.4 % Soln Generic drug:  Hypromellose Place 2 drops into both eyes 3 (three) times daily.   nitroGLYCERIN 0.4 MG SL tablet Commonly known as:  NITROSTAT Place 0.4 mg under the tongue every 5 (five) minutes as needed for chest pain.   oxybutynin  5 MG tablet Commonly known as:  DITROPAN Take 5 mg by mouth 2 (two) times daily.   phenazopyridine 100 MG tablet Commonly known as:  PYRIDIUM Take 100 mg by mouth 3 (three) times daily as needed for pain.   prednisoLONE acetate 1 % ophthalmic suspension Commonly known as:  PRED FORTE Place 1 drop into the left eye 4 (four) times daily.   saccharomyces boulardii 250 MG capsule Commonly known as:  FLORASTOR Take 250 mg by mouth 2 (two) times daily.   sertraline 25 MG tablet Commonly known as:  ZOLOFT Take 1 tablet (25 mg total) by mouth daily.   simvastatin 20 MG tablet Commonly known as:  ZOCOR Take 1 tablet (20 mg total) by mouth at bedtime.   SYSTANE 0.4-0.3 % Soln Generic drug:  Polyethyl Glycol-Propyl Glycol Place 1 drop into both eyes 2 (two) times daily.   tamsulosin 0.4 MG Caps capsule Commonly known as:  FLOMAX Take 0.4 mg by mouth at bedtime.  torsemide 10 MG tablet Commonly known as:  DEMADEX Take 10 mg by mouth daily.       Review of Systems  Limited secondary to dementia but is not complaining of any headache dizziness or numbness or chest pain or shortness of breath   Immunization History  Administered Date(s) Administered  . Influenza Split 06/25/2012  . Influenza Whole 07/18/2007, 08/18/2008, 08/09/2009, 06/14/2010, 06/20/2011  . Influenza,inj,Quad PF,36+ Mos 06/24/2013, 06/08/2014  . PPD Test 04/25/2016, 06/29/2016, 07/06/2016  . Pneumococcal Conjugate-13 01/06/2015  . Pneumococcal Polysaccharide-23 05/30/2008  . Tdap 06/20/2011   Pertinent  Health Maintenance Due  Topic Date Due  . LIPID PANEL  06/09/2015  . OPHTHALMOLOGY EXAM  04/12/2016  . INFLUENZA VACCINE  05/15/2016  . FOOT EXAM  08/21/2016  . HEMOGLOBIN A1C  12/12/2016  . COLONOSCOPY  08/29/2017  . PNA vac Low Risk Adult  Completed   Fall Risk  09/12/2016 05/24/2016 05/09/2016 04/30/2016 04/25/2016  Falls in the past year? Yes Yes Yes Yes Yes  Number falls in past yr: 2 or more 2 or  more - 2 or more 2 or more  Injury with Fall? Yes Yes Yes Yes No  Risk Factor Category  High Fall Risk High Fall Risk High Fall Risk High Fall Risk High Fall Risk  Risk for fall due to : History of fall(s);Impaired balance/gait;Impaired mobility History of fall(s);Impaired balance/gait;Impaired mobility History of fall(s);Impaired balance/gait;Impaired mobility History of fall(s);Impaired balance/gait;Impaired mobility;Mental status change History of fall(s)  Risk for fall due to (comments): - - - - -  Follow up Falls prevention discussed Education provided;Falls prevention discussed Falls prevention discussed Education provided;Falls prevention discussed -   Functional Status Survey:    Vitals:   09/25/16 1203  BP: (!) 162/64  Pulse: 62  Resp: (!) 62  Temp: 98.4 F (36.9 C)  TempSrc: Oral  SpO2: 98%  Weight: 172 lb (78 kg)  Height: 5\' 7"  (1.702 m)  Of note update a blood pressures as noted in history of present illness  Body mass index is 26.94 kg/m. Physical Exam   Constitutional: No distress. Lying comfortably in bed Eyes: Conjunctivae are normal.  Neck: Neck supple. No JVD present. No thyromegaly present.  Cardiovascular: Normal rate, regular rhythm and intact distal pulses.   Murmur heard. Respiratory: Effort normal and breath sounds normal. No respiratory distress. He has no wheezes.  GI: Soft. Bowel sounds are normal. He exhibits no distension. There is no tenderness.  Genitourinary:  Genitourinary Comments: Has foley   Musculoskeletal: He exhibits edema.  Left side weakness  mild lower extremity edema L>R  Lymphadenopathy:    He has no cervical adenopathy.  Neurological: He is alert. Is able to move all extremities with some baseline left-sided weakness Cranial nerves are grossly intact speech is clear but somewhat limited Skin: Skin is warm and dry. He is not diaphoretic.  Psychiatric: He has a normal mood and affect. Does not speak much    Labs  reviewed:  Recent Labs  08/28/16 2354 08/29/16 0014 08/29/16 0206 09/12/16 1158  NA 136 138 136 146*  K 3.7 3.7 4.0 3.9  CL 97* 97* 96* 101  CO2 26  --  29 29  GLUCOSE 317* 310* 296* 208*  BUN 35* 36* 38* 14  CREATININE 2.02* 2.00* 2.07* 1.03  CALCIUM 9.3  --  9.4 9.4    Recent Labs  06/09/16 08/28/16 2354  AST 12* 28  ALT 20 29  ALKPHOS 81 75  BILITOT  --  0.8  PROT  --  7.3  ALBUMIN  --  3.2*    Recent Labs  11/10/15 1333 06/09/16 08/28/16 2354 08/29/16 0014 08/29/16 0206  WBC 6.5 8.2 8.7  --  9.4  NEUTROABS 5.5  --  7.4  --   --   HGB 12.4* 10.8* 12.4* 13.9 12.4*  HCT 39.9 34* 39.7 41.0 39.4  MCV 84.0  --  78.8  --  79.1  PLT 152 313 197  --  192   Lab Results  Component Value Date   TSH 2.077 03/16/2013   Lab Results  Component Value Date   HGBA1C 8.3 09/12/2016   Lab Results  Component Value Date   CHOL 102 06/08/2014   HDL 47 06/08/2014   LDLCALC 46 06/08/2014   TRIG 47 06/08/2014   CHOLHDL 2.2 06/08/2014    Significant Diagnostic Results in last 30 days:  Dg Chest 2 View  Result Date: 08/29/2016 CLINICAL DATA:  Cough.  Weakness on the left EXAM: CHEST  2 VIEW COMPARISON:  08/28/2016 FINDINGS: The heart is mildly enlarged. There is aortic atherosclerosis. There is a linear scar at the left base. Lungs are otherwise clear. The vascularity is normal. No effusions. Chronic partial compression fracture lower thoracic spine. IMPRESSION: No active disease. Mild cardiomegaly. Aortic atherosclerosis. Left base scar. Electronically Signed   By: Nelson Chimes M.D.   On: 08/29/2016 13:58   Dg Chest Portable 1 View  Result Date: 08/28/2016 CLINICAL DATA:  Initial evaluation for acute shortness of breath, question possible pneumonia. EXAM: PORTABLE CHEST 1 VIEW COMPARISON:  Prior radiograph from 11/10/2015. FINDINGS: Mild cardiomegaly stable. Mediastinal silhouette within normal limits. Right rib bowing of the trach air column or on the aortic knob is  stable. Lungs are hypoinflated. Mild patchy and linear left basilar opacity. Atelectasis is favored, although subtle developing pneumonia not entirely excluded. No other focal airspace disease. No pulmonary edema or pleural effusion. No pneumothorax. No acute osseous abnormality. Advanced degenerative changes of both shoulders. IMPRESSION: 1. Shallow lung inflation with mild patchy and linear left basilar opacity. Atelectasis is favored. Possible mild and/or early/developing pneumonia not entirely excluded. 2. Stable cardiomegaly without pulmonary edema. Electronically Signed   By: Jeannine Boga M.D.   On: 08/28/2016 23:55    Assessment/Plan Hypertension-as noted above with a manual cuff that appears to fit patient better blood pressure still high but not as concerning with systolic of [id number] increase his hydralazine up to 20 mg 3 times a day continue the Prinivil 40 mg a day and Coreg 12.5 mg twice a day and monitor blood pressures closely clinically he appears to be stable in this regard.  #2 CVA continues with left-sided hemiparalysis this appears to be stable is on Plavix 75 mg a day.  #3 vascular dementia this appears at baseline he is on Aricept 10 mg a day and Namenda XR 20 mg daily.  #4 history of urinary retention requiring Foley is on Flomax 0.4 milligrams daily Ditropan 5 mg twice a day and myrbetriq 50 mg a day-he is followed by urology-and actually recently received a course of Cipro for enterococcus and Pseudomonas UTI.  #5 history of acute renal insufficiency with creatinine over 2 in the hospital this appears to him moderated with creatinine of 1.03 on lab done 09/12/2016 will update this.  #6 history of adjustment disorder this appears relatively stable on Zoloft 25 mg a day.  Will update a CBC and BMP for updated values.  #7 history of edema this appears to  be relatively baseline he continues on Demadex    \[id number]-of note greater than 40 minutes spent assessing  patient-reassessing patient-reviewing his chart-his labs-and coordinating plan of care

## 2016-09-26 LAB — BASIC METABOLIC PANEL
BUN: 14 mg/dL (ref 4–21)
Creatinine: 1 mg/dL (ref 0.6–1.3)
GLUCOSE: 133 mg/dL
POTASSIUM: 3.7 mmol/L (ref 3.4–5.3)
Sodium: 143 mmol/L (ref 137–147)

## 2016-09-26 LAB — CBC AND DIFFERENTIAL
HEMATOCRIT: 40 % — AB (ref 41–53)
Hemoglobin: 11.6 g/dL — AB (ref 13.5–17.5)
Neutrophils Absolute: 4 /uL
PLATELETS: 287 10*3/uL (ref 150–399)
WBC: 7.4 10*3/mL

## 2016-10-02 DIAGNOSIS — N3941 Urge incontinence: Secondary | ICD-10-CM | POA: Diagnosis not present

## 2016-10-02 DIAGNOSIS — R310 Gross hematuria: Secondary | ICD-10-CM | POA: Diagnosis not present

## 2016-10-03 DIAGNOSIS — I739 Peripheral vascular disease, unspecified: Secondary | ICD-10-CM | POA: Diagnosis not present

## 2016-10-03 DIAGNOSIS — L603 Nail dystrophy: Secondary | ICD-10-CM | POA: Diagnosis not present

## 2016-10-03 DIAGNOSIS — L84 Corns and callosities: Secondary | ICD-10-CM | POA: Diagnosis not present

## 2016-10-03 DIAGNOSIS — E1151 Type 2 diabetes mellitus with diabetic peripheral angiopathy without gangrene: Secondary | ICD-10-CM | POA: Diagnosis not present

## 2016-10-15 DIAGNOSIS — M6281 Muscle weakness (generalized): Secondary | ICD-10-CM | POA: Diagnosis not present

## 2016-10-15 DIAGNOSIS — I639 Cerebral infarction, unspecified: Secondary | ICD-10-CM | POA: Diagnosis not present

## 2016-10-15 DIAGNOSIS — R2689 Other abnormalities of gait and mobility: Secondary | ICD-10-CM | POA: Diagnosis not present

## 2016-11-16 ENCOUNTER — Non-Acute Institutional Stay (SKILLED_NURSING_FACILITY): Payer: PPO | Admitting: Adult Health

## 2016-11-16 ENCOUNTER — Encounter: Payer: Self-pay | Admitting: Adult Health

## 2016-11-16 DIAGNOSIS — I10 Essential (primary) hypertension: Secondary | ICD-10-CM

## 2016-11-16 DIAGNOSIS — E1149 Type 2 diabetes mellitus with other diabetic neurological complication: Secondary | ICD-10-CM | POA: Diagnosis not present

## 2016-11-16 DIAGNOSIS — I6350 Cerebral infarction due to unspecified occlusion or stenosis of unspecified cerebral artery: Secondary | ICD-10-CM

## 2016-11-16 DIAGNOSIS — E785 Hyperlipidemia, unspecified: Secondary | ICD-10-CM | POA: Diagnosis not present

## 2016-11-16 DIAGNOSIS — N319 Neuromuscular dysfunction of bladder, unspecified: Secondary | ICD-10-CM

## 2016-11-16 DIAGNOSIS — F4323 Adjustment disorder with mixed anxiety and depressed mood: Secondary | ICD-10-CM

## 2016-11-16 DIAGNOSIS — E1169 Type 2 diabetes mellitus with other specified complication: Secondary | ICD-10-CM

## 2016-11-16 NOTE — Progress Notes (Signed)
Location:   [facility]   Place of Service:  SNF (31)   CODE STATUS: dnr  Allergies  Allergen Reactions  . Pioglitazone Other (See Comments)    Edema   . Rosiglitazone Maleate Other (See Comments)    edema    Chief Complaint  Patient presents with  . Medical Management of Chronic Issues    HPI:  He is a long term resident of this facility being seen for the management of his chronic illnesses overall there is little change in his status. He is unable to fully participate in the hpi or ros. There are no nursing concerns at this time.   Past Medical History:  Diagnosis Date  . Arthritis    "maybe"  . 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 CHF (congestive heart failure) ([place])   . Depression   . Hyperlipemia   . Hypertension   . Sleep apnea    "my machine [place]; got to get me another one" (09/01/2014)  . Stroke [facility]) 2009   left thalamic infarct, residual LUE weakness  . TIA (transient ischemic attack)    multiple in past, most recently in 2011  . Type II diabetes mellitus ([place])     Past Surgical History:  Procedure Laterality Date  . CARDIAC CATHETERIZATION  07/2010  . CRANIOTOMY  05/2008   for clipping of an incidental asymptomatic unruptured/notes 02/13/2011  . KNEE ARTHROSCOPY Left   . 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 SHOULDER ARTHROSCOPY W/ ROTATOR CUFF REPAIR Right     Social History   Social History  . Marital status: Married    Spouse name: [name]  . Number of children: 3  . Years of education: HS   Occupational History  . disabled    Social History Main Topics  . Smoking status: Former Smoker    Packs/day: 1.50    Years: 15.00    Types: Cigarettes    Quit date: 10/16/1983  . Smokeless tobacco: Never Used  . Alcohol use No  . Drug use: No  . Sexual activity: No   Other Topics Concern  . Not on file   Social  History Narrative   Pt lives at home with spouse.   Caffeine Use: 1 cup daily   Family History  Problem Relation Age of Onset  . Stroke Mother   . Pneumonia Father   . Diabetes Sister   . Diabetes Brother   . Diabetes Paternal Aunt   . Colon cancer Neg Hx       VITAL SIGNS BP (!) 150/72   Pulse (!) 54   Temp 98.8 F (37.1 C)   Resp 18   Ht 5\' 7"  (1.702 m)   Wt 184 lb (83.5 kg)   SpO2 98%   BMI 28.82 kg/m   Patient's Medications  New Prescriptions   No medications on file  Previous Medications   AMLODIPINE (NORVASC) 5 MG TABLET    Take 5 mg by mouth daily.   CARVEDILOL (COREG) 12.5 MG TABLET    Take 1 tablet (12.5 mg total) by mouth 2 (two) times daily.   CLOPIDOGREL (PLAVIX) 75 MG TABLET    Take 1 tablet by mouth at bedtime. On hold until 09/01/2016   DICLOFENAC SODIUM (VOLTAREN) 1 % GEL    Apply 4 g topically 4 (four) times daily.   DONEPEZIL (ARICEPT) 10 MG TABLET  Take 1 tablet (10 mg total) by mouth at bedtime.   HYDRALAZINE (APRESOLINE) 10 MG TABLET    Take 10 mg by mouth 3 (three) times daily.   HYPROMELLOSE (NATURAL BALANCE TEARS) 0.4 % SOLN    Place 2 drops into both eyes 3 (three) times daily.    INSULIN ASPART (NOVOLOG) 100 UNIT/ML INJECTION    Inject 0-5 Units into the skin 3 (three) times daily before meals. Inject as per sliding scale: 0-59=0 call MD if less than 60, 60-150=3 units 301-400=5 units call MD if >400    INSULIN GLARGINE (LANTUS) 100 UNIT/ML INJECTION    Inject 0.1 mLs (10 Units total) into the skin at bedtime.   LISINOPRIL (PRINIVIL,ZESTRIL) 40 MG TABLET    Take 1 tablet (40 mg total) by mouth daily.   MEMANTINE (NAMENDA XR) 28 MG CP24 24 HR CAPSULE    Take 1 capsule (28 mg total) by mouth daily.   MIRABEGRON ER (MYRBETRIQ) 50 MG TB24 TABLET    Take 50 mg by mouth daily.   NITROGLYCERIN (NITROSTAT) 0.4 MG SL TABLET    Place 0.4 mg under the tongue every 5 (five) minutes as needed for chest pain.   OXYBUTYNIN (DITROPAN) 5 MG TABLET    Take 5 mg  by mouth 2 (two) times daily.   PHENAZOPYRIDINE (PYRIDIUM) 100 MG TABLET    Take 100 mg by mouth 3 (three) times daily as needed for pain.   POLYETHYL GLYCOL-PROPYL GLYCOL (SYSTANE) 0.4-0.3 % SOLN    Place 1 drop into both eyes 2 (two) times daily.   PREDNISOLONE ACETATE (PRED FORTE) 1 % OPHTHALMIC SUSPENSION    Place 1 drop into the left eye 4 (four) times daily.    SACCHAROMYCES BOULARDII (FLORASTOR) 250 MG CAPSULE    Take 250 mg by mouth 2 (two) times daily.   SERTRALINE (ZOLOFT) 25 MG TABLET    Take 1 tablet (25 mg total) by mouth daily.   SIMVASTATIN (ZOCOR) 20 MG TABLET    Take 1 tablet (20 mg total) by mouth at bedtime.   TAMSULOSIN (FLOMAX) 0.4 MG CAPS CAPSULE    Take 0.4 mg by mouth at bedtime.   TORSEMIDE (DEMADEX) 10 MG TABLET    Take 10 mg by mouth daily.  Modified Medications   No medications on file  Discontinued Medications   MUPIROCIN OINTMENT (BACTROBAN) 2 %    Apply 1 application topically 2 (two) times daily.     SIGNIFICANT DIAGNOSTIC EXAMS   08-28-16: chest x-ray: mild atelectasis left lung bases. No evidence of pneumonia or chf   LABS REVIEWED:   08-16-16: wound culture: mrsa 08-20-16; glucose 205; bun 24.3; creat 1.11; k+ 3.7; na++ 147 08-17-16: glucose 205; bun 16.0; creat 1.09; k+ 3.9; na++ 142 08-28-16: wbc 7.3; hgb 13.1; hct 43.5; mcv 83.2; plt 205; glucose 219; bun 24.1; creat 1.32; k+ 4.1; na++ 142; liver normal albumin 3.6  09-18-16: wbc 11.2; hgb 11.2; hct 36.1; mcv 83.5; plt 180; urine culture: pseudomonas aeruginosa: cipro  09-26-16; wbc 7.5; hgb 11.6; hct 39.7; mcv 82.9; plt 287; glucose 133; bun 13.8; creat 1.0;4; k+ 3.7; na++ 143    Review of Systems  Unable to perform ROS:  dementia  Physical Exam  Constitutional: No distress.  Eyes: Conjunctivae are normal.  Neck: Neck supple. No JVD present. No thyromegaly present.  Cardiovascular: Normal rate, regular rhythm and intact distal pulses.   Respiratory: Effort and lung sounds normal. No  respiratory distress. He has no wheezes.  GI: Soft.  Bowel sounds are normal. He exhibits no distension. There is no tenderness.  Musculoskeletal: He exhibits no edema.    Lymphadenopathy:    He has no cervical adenopathy.  Neurological: He is alert.  Skin: Skin is warm and dry. He is not diaphoretic.  Psychiatric: He has a normal mood and affect.     ASSESSMENT/ PLAN:   1. 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 Hypertension: will continue coreg 12.5 mg twice daily lisinopril 40 mg daily norvasc 5 mg daily   Will increase hydralazine to 50 mg four times  daily   3. Diabetes: will continue novolog SSI: 60-150=3units; 151-400=5 units lantus 10 units nightly   4. Vascular dementia: will continue aricept 10 mg nightly and namdena xr 28 mg daily   5. Dyslipidemia: will continue zocor 20 mg daily   6. Urine retention: requires foley; will continue flomax 0.4 mg daily ditropan 5 mg twice daily and myrbetriq 50 mg daily   7. Adjustment disorder: will continue zoloft 25 mg daily   8. Lower extremity edema: will continue demadex 10 mg daily   9. Osteoarthritis bilateral knees: will continue voltaren gel 4 gm to both knees four times daily     Will check hgb a1c lipids urine micro-albumin    MD is aware of resident's narcotic use and is in agreement with current plan of care. We will attempt to wean resident as apropriate   Ok Edwards NP [facility]  Contact [phone] Monday through Friday 8am- 5pm  After hours call [phone]

## 2016-11-19 DIAGNOSIS — I10 Essential (primary) hypertension: Secondary | ICD-10-CM | POA: Diagnosis not present

## 2016-11-19 DIAGNOSIS — R509 Fever, unspecified: Secondary | ICD-10-CM | POA: Diagnosis not present

## 2016-11-19 DIAGNOSIS — E119 Type 2 diabetes mellitus without complications: Secondary | ICD-10-CM | POA: Diagnosis not present

## 2016-11-19 DIAGNOSIS — Z79899 Other long term (current) drug therapy: Secondary | ICD-10-CM | POA: Diagnosis not present

## 2016-11-19 DIAGNOSIS — R531 Weakness: Secondary | ICD-10-CM | POA: Diagnosis not present

## 2016-12-20 DIAGNOSIS — E1142 Type 2 diabetes mellitus with diabetic polyneuropathy: Secondary | ICD-10-CM | POA: Diagnosis not present

## 2016-12-20 DIAGNOSIS — G63 Polyneuropathy in diseases classified elsewhere: Secondary | ICD-10-CM | POA: Diagnosis not present

## 2016-12-20 DIAGNOSIS — B351 Tinea unguium: Secondary | ICD-10-CM | POA: Diagnosis not present

## 2016-12-26 ENCOUNTER — Encounter: Payer: Self-pay | Admitting: Adult Health

## 2016-12-26 ENCOUNTER — Non-Acute Institutional Stay (SKILLED_NURSING_FACILITY): Payer: PPO | Admitting: Adult Health

## 2016-12-26 DIAGNOSIS — E1149 Type 2 diabetes mellitus with other diabetic neurological complication: Secondary | ICD-10-CM

## 2016-12-26 DIAGNOSIS — I10 Essential (primary) hypertension: Secondary | ICD-10-CM | POA: Diagnosis not present

## 2016-12-26 DIAGNOSIS — E785 Hyperlipidemia, unspecified: Secondary | ICD-10-CM

## 2016-12-26 DIAGNOSIS — I639 Cerebral infarction, unspecified: Secondary | ICD-10-CM

## 2016-12-26 DIAGNOSIS — E1169 Type 2 diabetes mellitus with other specified complication: Secondary | ICD-10-CM | POA: Diagnosis not present

## 2016-12-26 DIAGNOSIS — I6350 Cerebral infarction due to unspecified occlusion or stenosis of unspecified cerebral artery: Secondary | ICD-10-CM | POA: Diagnosis not present

## 2016-12-26 DIAGNOSIS — IMO0002 Reserved for concepts with insufficient information to code with codable children: Secondary | ICD-10-CM

## 2016-12-26 DIAGNOSIS — G3184 Mild cognitive impairment, so stated: Secondary | ICD-10-CM | POA: Diagnosis not present

## 2016-12-26 DIAGNOSIS — R339 Retention of urine, unspecified: Secondary | ICD-10-CM

## 2016-12-26 DIAGNOSIS — G8192 Hemiplegia, unspecified affecting left dominant side: Secondary | ICD-10-CM

## 2016-12-26 DIAGNOSIS — F0150 Vascular dementia without behavioral disturbance: Secondary | ICD-10-CM

## 2016-12-26 NOTE — Progress Notes (Signed)
Location:   [place] Room Number: 132 A Place of Service:  SNF (31)   CODE STATUS: DNR  Allergies  Allergen Reactions  . Pioglitazone Other (See Comments)    Edema   . Rosiglitazone Maleate Other (See Comments)    edema    Chief Complaint  Patient presents with  . Medical Management of Chronic Issues    Routine Visit    HPI:  He is a long term resident of this facility being seen for the management of his chronic illnesses. Overall his status is stable. He tells me that he is feeling good. There are no nursing concerns at this time.    Past Medical History:  Diagnosis Date  . Arthritis    "maybe"  . 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 CHF (congestive heart failure) ([place])   . Depression   . 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 Hyperlipemia   . Hypertension   . Sleep apnea    "my machine [place]; got to get me another one" (09/01/2014)  . Stroke [facility]) 2009   left thalamic infarct, residual LUE weakness  . TIA (transient ischemic attack)    multiple in past, most recently in 2011  . Type II diabetes mellitus ([place])     Past Surgical History:  Procedure Laterality Date  . CARDIAC CATHETERIZATION  07/2010  . CRANIOTOMY  05/2008   for clipping of an incidental asymptomatic unruptured/notes 02/13/2011  . KNEE ARTHROSCOPY Left   . 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 SHOULDER ARTHROSCOPY W/ ROTATOR CUFF REPAIR Right     Social History   Social History  . Marital status: Married    Spouse name: [name]  . Number of children: 3  . Years of education: HS   Occupational History  . disabled    Social History Main Topics  . Smoking status: Former Smoker    Packs/day: 1.50    Years: 15.00    Types: Cigarettes    Quit date: 10/16/1983  . Smokeless tobacco: Never Used    . Alcohol use No  . Drug use: No  . Sexual activity: No   Other Topics Concern  . Not on file   Social History Narrative   Pt lives at home with spouse.   Caffeine Use: 1 cup daily   Family History  Problem Relation Age of Onset  . Stroke Mother   . Pneumonia Father   . Diabetes Sister   . Diabetes Brother   . Diabetes Paternal Aunt   . Colon cancer Neg Hx       VITAL SIGNS BP (!) 165/86   Pulse 65   Ht 5\' 7"  (1.702 m)   Wt 187 lb 9.6 oz (85.1 kg)   BMI 29.38 kg/m   Patient's Medications  New Prescriptions   No medications on file  Previous Medications   AMLODIPINE (NORVASC) 5 MG TABLET    Take 5 mg by mouth daily.   CARVEDILOL (COREG) 12.5 MG TABLET    Take 1 tablet (12.5 mg total) by mouth 2 (two) times daily.   CLOPIDOGREL (PLAVIX) 75 MG TABLET    Take 1 tablet by mouth at bedtime. On hold until 09/01/2016   DICLOFENAC SODIUM (VOLTAREN) 1 % GEL    Apply 4 g topically 4 (  four) times daily.   DONEPEZIL (ARICEPT) 10 MG TABLET    Take 1 tablet (10 mg total) by mouth at bedtime.   HYDRALAZINE (APRESOLINE) 50 MG TABLET    Take 50 mg by mouth 4 (four) times daily.   HYPROMELLOSE (NATURAL BALANCE TEARS) 0.4 % SOLN    Place 2 drops into both eyes 2 (two) times daily.    INSULIN ASPART (NOVOLOG) 100 UNIT/ML INJECTION    Inject 0-5 Units into the skin 3 (three) times daily before meals. Inject as per sliding scale: 0-59=0 call MD if less than 60, 60-150=3 units 301-400=5 units call MD if >400    INSULIN GLARGINE (LANTUS) 100 UNIT/ML INJECTION    Inject 0.1 mLs (10 Units total) into the skin at bedtime.   LISINOPRIL (PRINIVIL,ZESTRIL) 20 MG TABLET    Give 2 tablets by mouth daily   MEMANTINE (NAMENDA XR) 28 MG CP24 24 HR CAPSULE    Take 1 capsule (28 mg total) by mouth daily.   MIRABEGRON ER (MYRBETRIQ) 50 MG TB24 TABLET    Take 50 mg by mouth daily.   NITROGLYCERIN (NITROSTAT) 0.4 MG SL TABLET    Place 0.4 mg under the tongue every 5 (five) minutes as needed for chest pain.    OXYBUTYNIN (DITROPAN) 5 MG TABLET    Take 5 mg by mouth 2 (two) times daily.   PHENAZOPYRIDINE (PYRIDIUM) 100 MG TABLET    Take 100 mg by mouth 3 (three) times daily as needed for pain.   POLYETHYL GLYCOL-PROPYL GLYCOL (SYSTANE) 0.4-0.3 % SOLN    Place 1 drop into both eyes 2 (two) times daily.   PREDNISOLONE ACETATE (PRED FORTE) 1 % OPHTHALMIC SUSPENSION    Place 1 drop into the left eye 4 (four) times daily.    SACCHAROMYCES BOULARDII (FLORASTOR) 250 MG CAPSULE    Take 250 mg by mouth 2 (two) times daily. Every 13 day   SERTRALINE (ZOLOFT) 25 MG TABLET    Take 1 tablet (25 mg total) by mouth daily.   SIMVASTATIN (ZOCOR) 20 MG TABLET    Take 1 tablet (20 mg total) by mouth at bedtime.   TAMSULOSIN (FLOMAX) 0.4 MG CAPS CAPSULE    Take 0.4 mg by mouth at bedtime.   TORSEMIDE (DEMADEX) 10 MG TABLET    Take 10 mg by mouth daily.  Modified Medications   No medications on file  Discontinued Medications   HYDRALAZINE (APRESOLINE) 10 MG TABLET    Take 10 mg by mouth 3 (three) times daily.   LISINOPRIL (PRINIVIL,ZESTRIL) 40 MG TABLET    Take 1 tablet (40 mg total) by mouth daily.     SIGNIFICANT DIAGNOSTIC EXAMS  08-28-16: chest x-ray: mild atelectasis left lung bases. No evidence of pneumonia or chf   LABS REVIEWED:   08-16-16: wound culture: mrsa 08-20-16; glucose 205; bun 24.3; creat 1.11; k+ 3.7; na++ 147 08-17-16: glucose 205; bun 16.0; creat 1.09; k+ 3.9; na++ 142 08-28-16: wbc 7.3; hgb 13.1; hct 43.5; mcv 83.2; plt 205; glucose 219; bun 24.1; creat 1.32; k+ 4.1; na++ 142; liver normal albumin 3.6  09-18-16: wbc 11.2; hgb 11.2; hct 36.1; mcv 83.5; plt 180; urine culture: pseudomonas aeruginosa: cipro  09-26-16; wbc 7.5; hgb 11.6; hct 39.7; mcv 82.9; plt 287; glucose 133; bun 13.8; creat 1.0;4; k+ 3.7; na++ 143    Review of Systems  Unable to perform ROS:  dementia  Physical Exam  Constitutional: No distress.  Eyes: Conjunctivae are normal.  Neck: Neck supple. No JVD present.  No  thyromegaly present.  Cardiovascular: Normal rate, regular rhythm and intact distal pulses.   Respiratory: Effort and lung sounds normal. No respiratory distress. He has no wheezes.  GI: Soft. Bowel sounds are normal. He exhibits no distension. There is no tenderness.  Has foley  Musculoskeletal: He exhibits no edema.    Lymphadenopathy:    He has no cervical adenopathy.  Neurological: He is alert.  Skin: Skin is warm and dry. He is not diaphoretic.  Psychiatric: He has a normal mood and affect.     ASSESSMENT/ PLAN:   1. 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 Hypertension: will continue coreg 12.5 mg twice daily lisinopril 40 mg daily norvasc 5 mg daily   hydralazine  50 mg four times  daily   3. Diabetes: will continue novolog SSI: 60-150=3units; 151-400=5 units lantus 10 units nightly   4. Vascular dementia: will continue aricept 10 mg nightly and namdena xr 28 mg daily   5. Dyslipidemia: will continue zocor 20 mg daily   6. Urine retention: requires foley; will continue flomax 0.4 mg daily ditropan 5 mg twice daily and myrbetriq 50 mg daily   7. Adjustment disorder: will continue zoloft 25 mg daily   8. Lower extremity edema: will continue demadex 10 mg daily   9. Osteoarthritis bilateral knees: will continue voltaren gel 4 gm to both knees four times daily     Ok Edwards NP [facility]  Contact [phone] Monday through Friday 8am- 5pm  After hours call [phone]

## 2016-12-27 DIAGNOSIS — E1151 Type 2 diabetes mellitus with diabetic peripheral angiopathy without gangrene: Secondary | ICD-10-CM | POA: Diagnosis not present

## 2016-12-27 DIAGNOSIS — L84 Corns and callosities: Secondary | ICD-10-CM | POA: Diagnosis not present

## 2016-12-27 DIAGNOSIS — I739 Peripheral vascular disease, unspecified: Secondary | ICD-10-CM | POA: Diagnosis not present

## 2016-12-27 DIAGNOSIS — L603 Nail dystrophy: Secondary | ICD-10-CM | POA: Diagnosis not present

## 2017-01-22 ENCOUNTER — Non-Acute Institutional Stay (SKILLED_NURSING_FACILITY): Payer: PPO | Admitting: Internal Medicine

## 2017-01-22 DIAGNOSIS — I10 Essential (primary) hypertension: Secondary | ICD-10-CM

## 2017-01-22 DIAGNOSIS — IMO0002 Reserved for concepts with insufficient information to code with codable children: Secondary | ICD-10-CM

## 2017-01-22 DIAGNOSIS — I639 Cerebral infarction, unspecified: Secondary | ICD-10-CM | POA: Diagnosis not present

## 2017-01-22 DIAGNOSIS — F0150 Vascular dementia without behavioral disturbance: Secondary | ICD-10-CM

## 2017-01-22 DIAGNOSIS — E1149 Type 2 diabetes mellitus with other diabetic neurological complication: Secondary | ICD-10-CM | POA: Diagnosis not present

## 2017-01-22 DIAGNOSIS — F0631 Mood disorder due to known physiological condition with depressive features: Secondary | ICD-10-CM | POA: Diagnosis not present

## 2017-01-22 DIAGNOSIS — R609 Edema, unspecified: Secondary | ICD-10-CM

## 2017-01-22 DIAGNOSIS — I6350 Cerebral infarction due to unspecified occlusion or stenosis of unspecified cerebral artery: Secondary | ICD-10-CM

## 2017-01-22 NOTE — Progress Notes (Signed)
This is a routine visit.  Level care skilled.  Facility is [facility]    CODE STATUS: DNR       Allergies  Allergen Reactions  . Pioglitazone Other (See Comments)    Edema   . Rosiglitazone Maleate Other (See Comments)    edema     Chief complaint routine visit for medical management of chronic medical issues including history of CVA-hypertension-diabetes 2-dementia-adjustment disorder-lower extremity edema-osteoarthritis--acute visit secondary to left arm edema-pain?  History of present illness.  Patient is a 77 year old male with the above diagnoses he has been relatively stable however he is complaining of some left lower arm hand discomfort at times according to nursing-initially when I saw him he was denying pain but when I reevaluated him later in the day he did say it hurts somewhat when his wrist was flexed and extended in this and had some tenderness to palpation.  He also has a history of hypertension in appears systolic blood pressures haven't times been up in the 160s and 170s I got 160/62 manually this afternoon.  He is not complaining of any headache or dizziness he is on numerous antihypertensives including hydralazine 50 mg 4 times a day-Norvasc 5 mg a day Coreg 12.5 mg twice a day and lisinopril 40 mg a day.  Regards to dementia appears to be doing well with supportive care his weight is stable at 186 pounds.  Lower extremity edema appears relatively at baseline he is on Demadex 10 mg a day.  He does have a history diabetes type 2 is on Lantus 10 units daily at bedtime as well as sliding scale insulin with meals-blood sugars appear somewhat variable sometimes in the 80s in the morning--baseline in the mornings appear to be more in the lower 100s-=- and in the mid 100s to 200s later in the day-generally   Currently he is lying in bed comfortably again at times is complaining of some left lower arm hand pain this does not appear to be constant  however.  Past Medical History:  Diagnosis Date  . Arthritis    "maybe"  . 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 CHF (congestive heart failure) ([place])   . Depression   . 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 Hyperlipemia   . Hypertension   . Sleep apnea    "my machine [place]; got to get me another one" (09/01/2014)  . Stroke [facility]) 2009   left thalamic infarct, residual LUE weakness  . TIA (transient ischemic attack)    multiple in past, most recently in 2011  . Type II diabetes mellitus ([place])          Past Surgical History:  Procedure Laterality Date  . CARDIAC CATHETERIZATION  07/2010  . CRANIOTOMY  05/2008   for clipping of an incidental asymptomatic unruptured/notes 02/13/2011  . KNEE ARTHROSCOPY Left   . 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 SHOULDER ARTHROSCOPY W/ ROTATOR CUFF REPAIR Right     Social History        Social History  . Marital status: Married    Spouse name: [name]  . Number of children: 3  . Years of education: HS       Occupational History  . disabled         Social History Main Topics  .  Smoking status: Former Smoker    Packs/day: 1.50    Years: 15.00    Types: Cigarettes    Quit date: 10/16/1983  . Smokeless tobacco: Never Used  . Alcohol use No  . Drug use: No  . Sexual activity: No       Other Topics Concern  . Not on file   Social History Narrative   Pt lives at home with spouse.   Caffeine Use: 1 cup daily        Family History  Problem Relation Age of Onset  . Stroke Mother   . Pneumonia Father   . Diabetes Sister   . Diabetes Brother   . Diabetes Paternal Aunt   . Colon cancer Neg Hx       VITAL SIGNS Temperature is 97.4 pulse 50 respirations 16 blood pressure 160/64-weight is stable  at 186      Patient's Medications  New Prescriptions   No medications on file  Previous Medications   AMLODIPINE (NORVASC) 5 MG TABLET    Take 5 mg by mouth daily.   CARVEDILOL (COREG) 12.5 MG TABLET    Take 1 tablet (12.5 mg total) by mouth 2 (two) times daily.   CLOPIDOGREL (PLAVIX) 75 MG TABLET    Take 1 tablet by mouth at bedtime. On hold until 09/01/2016   DICLOFENAC SODIUM (VOLTAREN) 1 % GEL    Apply 4 g topically 4 (four) times daily.   DONEPEZIL (ARICEPT) 10 MG TABLET    Take 1 tablet (10 mg total) by mouth at bedtime.   HYDRALAZINE (APRESOLINE) 50 MG TABLET    Take 50 mg by mouth 4 (four) times daily.   HYPROMELLOSE (NATURAL BALANCE TEARS) 0.4 % SOLN    Place 2 drops into both eyes 2 (two) times daily.    INSULIN ASPART (NOVOLOG) 100 UNIT/ML INJECTION    Inject 0-5 Units into the skin 3 (three) times daily before meals. Inject as per sliding scale: 0-59=0 call MD if less than 60, 60-150=3 units 301-400=5 units call MD if >400    INSULIN GLARGINE (LANTUS) 100 UNIT/ML INJECTION    Inject 0.1 mLs (10 Units total) into the skin at bedtime.   LISINOPRIL (PRINIVIL,ZESTRIL) 20 MG TABLET    Give 2 tablets by mouth daily   MEMANTINE (NAMENDA XR) 28 MG CP24 24 HR CAPSULE    Take 1 capsule (28 mg total) by mouth daily.   MIRABEGRON ER (MYRBETRIQ) 50 MG TB24 TABLET    Take 50 mg by mouth daily.   NITROGLYCERIN (NITROSTAT) 0.4 MG SL TABLET    Place 0.4 mg under the tongue every 5 (five) minutes as needed for chest pain.   OXYBUTYNIN (DITROPAN) 5 MG TABLET    Take 5 mg by mouth 2 (two) times daily.   PHENAZOPYRIDINE (PYRIDIUM) 100 MG TABLET    Take 100 mg by mouth 3 (three) times daily as needed for pain.   POLYETHYL GLYCOL-PROPYL GLYCOL (SYSTANE) 0.4-0.3 % SOLN    Place 1 drop into both eyes 2 (two) times daily.   PREDNISOLONE ACETATE (PRED FORTE) 1 % OPHTHALMIC SUSPENSION    Place 1 drop into the left eye 4 (four) times daily.    SACCHAROMYCES BOULARDII (FLORASTOR) 250 MG  CAPSULE    Take 250 mg by mouth 2 (two) times daily. Every 13 day   SERTRALINE (ZOLOFT) 25 MG TABLET    Take 1 tablet (25 mg total) by mouth daily.   SIMVASTATIN (ZOCOR) 20 MG TABLET  Take 1 tablet (20 mg total) by mouth at bedtime.   TAMSULOSIN (FLOMAX) 0.4 MG CAPS CAPSULE    Take 0.4 mg by mouth at bedtime.   TORSEMIDE (DEMADEX) 10 MG TABLET    Take 10 mg by mouth daily.  Modified Medications   No medications on file  Discontinued Medications   HYDRALAZINE (APRESOLINE) 10 MG TABLET    Take 10 mg by mouth 3 (three) times daily.   LISINOPRIL (PRINIVIL,ZESTRIL) 40 MG TABLET    Take 1 tablet (40 mg total) by mouth daily.     SIGNIFICANT DIAGNOSTIC EXAMS  08-28-16: chest x-ray: mild atelectasis left lung bases. No evidence of pneumonia or chf   LABS REVIEWED:   08-16-16: wound culture: mrsa 08-20-16; glucose 205; bun 24.3; creat 1.11; k+ 3.7; na++ 147 08-17-16: glucose 205; bun 16.0; creat 1.09; k+ 3.9; na++ 142 08-28-16: wbc 7.3; hgb 13.1; hct 43.5; mcv 83.2; plt 205; glucose 219; bun 24.1; creat 1.32; k+ 4.1; na++ 142; liver normal albumin 3.6  09-18-16: wbc 11.2; hgb 11.2; hct 36.1; mcv 83.5; plt 180; urine culture: pseudomonas aeruginosa: cipro  09-26-16; wbc 7.5; hgb 11.6; hct 39.7; mcv 82.9; plt 287; glucose 133; bun 13.8; creat 1.0;4; k+ 3.7; na++ 143    Review of Systems  This is limited secondary to dementia-provided by nursing as well.  General is not complaining of fever or chills.  Skin does not complain of rashes or itching.  Head ears eyes nose mouth and throat does not complaining of sore throat or visual changes.  Respiratory does not complain of cough or shortness breath.  Cardiac does not complain of chest pain has somewhat chronic lower extremity edema more so on the left versus the right.  GI is not complaining of abdominal discomfort nausea vomiting diarrhea constipation.  GU does have some history of urinary retention has a chronic  indwelling Foley catheter.  Musculoskeletal at times does complain of some left lower arm and wrist pain per nursing.  Otherwise does not really complain of pain.  Neurologic is not complaining of dizziness headache syncope or numbness.  It psych does have some history of dementia apparently behaviors have been stable  Physical Exam  Constitutional: No distress. Lying comfortably in bed Eyes: Conjunctivae are normal.  Neck: Neck supple. No JVD present. No thyromegaly present.  Cardiovascular: Bradycardic at 50, regular rhythm and intact distal pulses.   Respiratory: Effort and lung sounds normal. No respiratory distress. He has no wheezes.  GI: Soft. Bowel sounds are normal. He exhibits no distension. There is no tenderness.  Has foley draining amber colored urine  Musculoskeletal: He exhibits mild edema this is on the left greater than right and also is on his left arm per nursing this kind of waxes and wanes.  His radial pulse is intact on the left-he does have reduced grip strength on the left versus right but this is chronic status post CVA he is able to lift his left arm to some extent again with some weakness which is not new.  On initial exam denied any tenderness to palpation of his arm and hand-however on reexam he said he did have some tenderness of his hand-there is no erythema the edema is cool to touch there is some pain he says when his wrist is flexed and extended this was on reexam-- initial exam he did not really complain of pain with flexion and extension   Neurologic he does have left-sided weakness as noted above cranial nerves appear grossly  intact speech is limited but appears to be fairly clear.  Psyche is oriented to self did follow simple verbal commands    Lymphadenopathy:    He has no cervical adenopathy.  Neurological: He is alert.  Skin: Skin is warm and dry. He is not diaphoretic.  Psychiatric: He has a normal mood and affect.     ASSESSMENT/  PLAN:   1. 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 Hypertension: will continue coreg 12.5 mg twice daily lisinopril 40 mg daily norvasc 5 mg daily  --will increase hydralazine up to 75 mg 4 times a day-check blood pressures and pulse every shift with a log for provider review next week for follow-up.  Also hold Coreg for pulse less than 50  3. Diabetes: will continue novolog SSI: 60-150=3units; 151-400=5 units lantus 10 units nightly secondary to some a.m. readings in the 80s will switch Lantus to a.m. dosing from p.m. and monitor also update a hemoglobin A1c  4. Vascular dementia: will continue aricept 10 mg nightly and namdena xr 28 mg daily   5. Dyslipidemia: will continue zocor 20 mg daily   6. Urine retention: requires foley; will continue flomax 0.4 mg daily ditropan 5 mg twice daily and myrbetriq 50 mg daily   7. Adjustment disorder: will continue zoloft 25 mg daily   8. Lower extremity edema: will continue demadex 10 mg daily his weight appears to be stable we will update a BMP  9. Osteoarthritis bilateral knees: will continue voltaren gel 4 gm to both knees four times daily   #10 left arm edema some of this could be dependent related with his history of CVA and weakness on that side apparently he does come and go but will order a venous Doppler rule out any DVT-also x-ray his arm and hand to look for any acute process although I believe the likelihood is fairly low.  Also will update a uric acid level.  Of note Will update a CBC BMP hemoglobin A1c as well as uric acid level.  [id number] note greater than 45 minutes spent assessing patient-reviewing his chart-reviewing his labs-discussing his status with nursing staff-and coordinating formulating a plan of care for numerous diagnoses-of note greater than 50% of time spent coordinating plan of care

## 2017-01-23 DIAGNOSIS — D649 Anemia, unspecified: Secondary | ICD-10-CM | POA: Diagnosis not present

## 2017-01-23 DIAGNOSIS — I10 Essential (primary) hypertension: Secondary | ICD-10-CM | POA: Diagnosis not present

## 2017-01-23 DIAGNOSIS — R509 Fever, unspecified: Secondary | ICD-10-CM | POA: Diagnosis not present

## 2017-01-23 DIAGNOSIS — R531 Weakness: Secondary | ICD-10-CM | POA: Diagnosis not present

## 2017-01-23 DIAGNOSIS — E119 Type 2 diabetes mellitus without complications: Secondary | ICD-10-CM | POA: Diagnosis not present

## 2017-01-23 LAB — BASIC METABOLIC PANEL
BUN: 18 (ref 4–21)
BUN: 18 mg/dL (ref 4–21)
CREATININE: 0.9 mg/dL (ref 0.6–1.3)
Creatinine: 0.9 (ref 0.6–1.3)
GLUCOSE: 118
Glucose: 118 mg/dL
POTASSIUM: 3.6 mmol/L (ref 3.4–5.3)
Potassium: 3.6 (ref 3.4–5.3)
SODIUM: 148 mmol/L — AB (ref 137–147)
SODIUM: 148 — AB (ref 137–147)

## 2017-01-23 LAB — CBC AND DIFFERENTIAL
HCT: 40 % — AB (ref 41–53)
HEMATOCRIT: 40 — AB (ref 41–53)
HEMOGLOBIN: 12.1 g/dL — AB (ref 13.5–17.5)
Hemoglobin: 12.1 — AB (ref 13.5–17.5)
Neutrophils Absolute: 4 /uL
Platelets: 184 (ref 150–399)
Platelets: 184 10*3/uL (ref 150–399)
WBC: 6.4
WBC: 6.4 10^3/mL

## 2017-01-23 LAB — HEMOGLOBIN A1C: HEMOGLOBIN A1C: 7.6

## 2017-01-28 DIAGNOSIS — M6281 Muscle weakness (generalized): Secondary | ICD-10-CM | POA: Diagnosis not present

## 2017-01-28 DIAGNOSIS — R2689 Other abnormalities of gait and mobility: Secondary | ICD-10-CM | POA: Diagnosis not present

## 2017-01-28 DIAGNOSIS — I639 Cerebral infarction, unspecified: Secondary | ICD-10-CM | POA: Diagnosis not present

## 2017-02-12 DIAGNOSIS — I639 Cerebral infarction, unspecified: Secondary | ICD-10-CM | POA: Diagnosis not present

## 2017-02-12 DIAGNOSIS — R2689 Other abnormalities of gait and mobility: Secondary | ICD-10-CM | POA: Diagnosis not present

## 2017-02-12 DIAGNOSIS — M6281 Muscle weakness (generalized): Secondary | ICD-10-CM | POA: Diagnosis not present

## 2017-02-22 ENCOUNTER — Encounter: Payer: Self-pay | Admitting: Adult Health

## 2017-02-22 ENCOUNTER — Non-Acute Institutional Stay (SKILLED_NURSING_FACILITY): Payer: PPO | Admitting: Adult Health

## 2017-02-22 DIAGNOSIS — R630 Anorexia: Secondary | ICD-10-CM | POA: Diagnosis not present

## 2017-02-22 DIAGNOSIS — E785 Hyperlipidemia, unspecified: Secondary | ICD-10-CM

## 2017-02-22 DIAGNOSIS — E1169 Type 2 diabetes mellitus with other specified complication: Secondary | ICD-10-CM | POA: Diagnosis not present

## 2017-02-22 NOTE — Progress Notes (Signed)
Location:   [facility] Room Number: 132 A Place of Service:  SNF (31)   CODE STATUS: DNR  Allergies  Allergen Reactions  . Pioglitazone Other (See Comments)    Edema   . Rosiglitazone Maleate Other (See Comments)    edema    Chief Complaint  Patient presents with  . Acute Visit    Decreased Appetite    HPI:  Staff has noted that he has a decreased appetite. He has experienced some weight loss from march 2018 of 187 pounds to his current of 169 pounds. He states he is not hungry   Past Medical History:  Diagnosis Date  . Arthritis    "maybe"  . 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 CHF (congestive heart failure) ([place])   . Depression   . 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 Hyperlipemia   . Hypertension   . Sleep apnea    "my machine [place]; got to get me another one" (09/01/2014)  . Stroke [facility]) 2009   left thalamic infarct, residual LUE weakness  . TIA (transient ischemic attack)    multiple in past, most recently in 2011  . Type II diabetes mellitus ([place])     Past Surgical History:  Procedure Laterality Date  . CARDIAC CATHETERIZATION  07/2010  . CRANIOTOMY  05/2008   for clipping of an incidental asymptomatic unruptured/notes 02/13/2011  . KNEE ARTHROSCOPY Left   . 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 SHOULDER ARTHROSCOPY W/ ROTATOR CUFF REPAIR Right     Social History   Social History  . Marital status: Married    Spouse name: [name]  . Number of children: 3  . Years of education: HS   Occupational History  . disabled    Social History Main Topics  . Smoking status: Former Smoker    Packs/day: 1.50    Years: 15.00    Types: Cigarettes    Quit date: 10/16/1983  . Smokeless tobacco: Never Used  . Alcohol use No  . Drug use: No  . Sexual activity: No    Other Topics Concern  . Not on file   Social History Narrative   Pt lives at home with spouse.   Caffeine Use: 1 cup daily   Family History  Problem Relation Age of Onset  . Stroke Mother   . Pneumonia Father   . Diabetes Sister   . Diabetes Brother   . Diabetes Paternal Aunt   . Colon cancer Neg Hx       VITAL SIGNS BP (!) 150/76   Pulse 68   Ht 5\' 7"  (1.702 m)   Wt 169 lb (76.7 kg)   BMI 26.47 kg/m   Patient's Medications  New Prescriptions   No medications on file  Previous Medications   AMLODIPINE (NORVASC) 5 MG TABLET    Take 5 mg by mouth daily.   CARVEDILOL (COREG) 12.5 MG TABLET    Take 1 tablet (12.5 mg total) by mouth 2 (two) times daily.   CLOPIDOGREL (PLAVIX) 75 MG TABLET    Take 1 tablet by mouth at bedtime. On hold until 09/01/2016   DICLOFENAC SODIUM (VOLTAREN) 1 % GEL    Apply 4 g topically 4 (four) times daily.   DONEPEZIL (ARICEPT) 10 MG TABLET    Take  1 tablet (10 mg total) by mouth at bedtime.   HYDRALAZINE (APRESOLINE) 50 MG TABLET    Take 75 mg by mouth 4 (four) times daily.    HYPROMELLOSE (NATURAL BALANCE TEARS) 0.4 % SOLN    Place 2 drops into both eyes 2 (two) times daily.    INSULIN ASPART (NOVOLOG) 100 UNIT/ML INJECTION    Inject 0-5 Units into the skin 3 (three) times daily before meals. Inject as per sliding scale: 0-59=0 call MD if less than 60, 60-150=3 units 301-400=5 units call MD if >400    INSULIN GLARGINE (LANTUS) 100 UNIT/ML INJECTION    Inject 10 Units into the skin daily.   LISINOPRIL (PRINIVIL,ZESTRIL) 20 MG TABLET    Give 2 tablets by mouth daily   MEMANTINE (NAMENDA XR) 28 MG CP24 24 HR CAPSULE    Take 1 capsule (28 mg total) by mouth daily.   MIRABEGRON ER (MYRBETRIQ) 50 MG TB24 TABLET    Take 50 mg by mouth daily.   NITROGLYCERIN (NITROSTAT) 0.4 MG SL TABLET    Place 0.4 mg under the tongue every 5 (five) minutes as needed for chest pain.   OXYBUTYNIN (DITROPAN) 5 MG TABLET    Take 5 mg by mouth 2 (two) times daily.    PHENAZOPYRIDINE (PYRIDIUM) 100 MG TABLET    Take 100 mg by mouth 3 (three) times daily as needed for pain.   POLYETHYL GLYCOL-PROPYL GLYCOL (SYSTANE) 0.4-0.3 % SOLN    Place 1 drop into both eyes 2 (two) times daily.   PREDNISOLONE ACETATE (PRED FORTE) 1 % OPHTHALMIC SUSPENSION    Place 1 drop into the left eye 4 (four) times daily.    SACCHAROMYCES BOULARDII (FLORASTOR) 250 MG CAPSULE    Take 250 mg by mouth 2 (two) times daily. Every 13 day   SERTRALINE (ZOLOFT) 25 MG TABLET    Take 1 tablet (25 mg total) by mouth daily.   SIMVASTATIN (ZOCOR) 20 MG TABLET    Take 1 tablet (20 mg total) by mouth at bedtime.   TAMSULOSIN (FLOMAX) 0.4 MG CAPS CAPSULE    Take 0.4 mg by mouth at bedtime.   TORSEMIDE (DEMADEX) 10 MG TABLET    Take 10 mg by mouth daily.  Modified Medications   No medications on file  Discontinued Medications   INSULIN GLARGINE (LANTUS) 100 UNIT/ML INJECTION    Inject 0.1 mLs (10 Units total) into the skin at bedtime.     SIGNIFICANT DIAGNOSTIC EXAMS  08-28-16: chest x-ray: mild atelectasis left lung bases. No evidence of pneumonia or chf   LABS REVIEWED:   08-16-16: wound culture: mrsa 08-20-16; glucose 205; bun 24.3; creat 1.11; k+ 3.7; na++ 147 08-17-16: glucose 205; bun 16.0; creat 1.09; k+ 3.9; na++ 142 08-28-16: wbc 7.3; hgb 13.1; hct 43.5; mcv 83.2; plt 205; glucose 219; bun 24.1; creat 1.32; k+ 4.1; na++ 142; liver normal albumin 3.6  09-18-16: wbc 11.2; hgb 11.2; hct 36.1; mcv 83.5; plt 180; urine culture: pseudomonas aeruginosa: cipro  09-26-16; wbc 7.5; hgb 11.6; hct 39.7; mcv 82.9; plt 287; glucose 133; bun 13.8; creat 1.0;4; k+ 3.7; na++ 143    Review of Systems  Unable to perform ROS:  dementia  Physical Exam  Constitutional: No distress.  Eyes: Conjunctivae are normal.  Neck: Neck supple. No JVD present. No thyromegaly present.  Cardiovascular: Normal rate, regular rhythm and intact distal pulses.   Respiratory: Effort and lung sounds normal. No respiratory  distress. He has no wheezes.  GI: Soft. Bowel  sounds are normal. He exhibits no distension. There is no tenderness.  Has foley  Musculoskeletal: He exhibits no edema.    Lymphadenopathy:    He has no cervical adenopathy.  Neurological: He is alert.  Skin: Skin is warm and dry. He is not diaphoretic.  Psychiatric: He has a normal mood and affect.     ASSESSMENT/ PLAN:  1. Dyslipidemia: will stop zocor due to loss of appetite and weight loss    2. Loss of appetite: will begin remeron 7.5 mg nightly for 30 days and will place on weekly weights will monitor   Will check cbc; cmp lipids   Ok Edwards NP [facility]  Contact [phone] Monday through Friday 8am- 5pm  After hours call [phone]

## 2017-02-25 DIAGNOSIS — I10 Essential (primary) hypertension: Secondary | ICD-10-CM | POA: Diagnosis not present

## 2017-02-25 DIAGNOSIS — E119 Type 2 diabetes mellitus without complications: Secondary | ICD-10-CM | POA: Diagnosis not present

## 2017-02-25 DIAGNOSIS — D649 Anemia, unspecified: Secondary | ICD-10-CM | POA: Diagnosis not present

## 2017-02-25 DIAGNOSIS — R531 Weakness: Secondary | ICD-10-CM | POA: Diagnosis not present

## 2017-02-25 DIAGNOSIS — R509 Fever, unspecified: Secondary | ICD-10-CM | POA: Diagnosis not present

## 2017-02-25 LAB — BASIC METABOLIC PANEL
BUN: 18 mg/dL (ref 4–21)
Creatinine: 0.9 mg/dL (ref 0.6–1.3)
Glucose: 160 mg/dL
Potassium: 3.8 mmol/L (ref 3.4–5.3)
SODIUM: 145 mmol/L (ref 137–147)

## 2017-02-25 LAB — HEPATIC FUNCTION PANEL
ALK PHOS: 74 U/L (ref 25–125)
ALT: 7 U/L — AB (ref 10–40)
AST: 7 U/L — AB (ref 14–40)
BILIRUBIN, TOTAL: 0.5 mg/dL

## 2017-02-25 LAB — CBC AND DIFFERENTIAL
HCT: 39 % — AB (ref 41–53)
HEMOGLOBIN: 11.8 g/dL — AB (ref 13.5–17.5)
NEUTROS ABS: 4 /uL
Platelets: 181 10*3/uL (ref 150–399)
WBC: 7 10^3/mL

## 2017-02-27 ENCOUNTER — Non-Acute Institutional Stay (SKILLED_NURSING_FACILITY): Payer: PPO | Admitting: Adult Health

## 2017-02-27 ENCOUNTER — Encounter: Payer: Self-pay | Admitting: Adult Health

## 2017-02-27 DIAGNOSIS — E1149 Type 2 diabetes mellitus with other diabetic neurological complication: Secondary | ICD-10-CM

## 2017-02-27 DIAGNOSIS — I6350 Cerebral infarction due to unspecified occlusion or stenosis of unspecified cerebral artery: Secondary | ICD-10-CM | POA: Diagnosis not present

## 2017-02-27 DIAGNOSIS — E1169 Type 2 diabetes mellitus with other specified complication: Secondary | ICD-10-CM | POA: Diagnosis not present

## 2017-02-27 DIAGNOSIS — I10 Essential (primary) hypertension: Secondary | ICD-10-CM

## 2017-02-27 DIAGNOSIS — E785 Hyperlipidemia, unspecified: Secondary | ICD-10-CM

## 2017-02-27 DIAGNOSIS — F4323 Adjustment disorder with mixed anxiety and depressed mood: Secondary | ICD-10-CM

## 2017-02-27 DIAGNOSIS — G8192 Hemiplegia, unspecified affecting left dominant side: Secondary | ICD-10-CM | POA: Diagnosis not present

## 2017-02-27 DIAGNOSIS — R630 Anorexia: Secondary | ICD-10-CM | POA: Diagnosis not present

## 2017-02-27 DIAGNOSIS — IMO0002 Reserved for concepts with insufficient information to code with codable children: Secondary | ICD-10-CM

## 2017-02-27 DIAGNOSIS — I639 Cerebral infarction, unspecified: Secondary | ICD-10-CM | POA: Diagnosis not present

## 2017-02-27 NOTE — Progress Notes (Signed)
Location:   [facility] Room Number: 709 A Place of Service:  SNF (31)   CODE STATUS: DNR  Allergies  Allergen Reactions  . Pioglitazone Other (See Comments)    Edema   . Rosiglitazone Maleate Other (See Comments)    edema    Chief Complaint  Patient presents with  . Medical Management of Chronic Issues    1 month follow up    HPI:  He is a long term resident of this facility being seen for the management of his chronic illnesses. He has not lost further weight he is currently on remeron 7.5 mg nightly. There are no reports of appetite change. He tell me that he is feeling good.    Past Medical History:  Diagnosis Date  . Arthritis    "maybe"  . 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 CHF (congestive heart failure) ([place])   . Depression   . 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 Hyperlipemia   . Hypertension   . Sleep apnea    "my machine [place]; got to get me another one" (09/01/2014)  . Stroke [facility]) 2009   left thalamic infarct, residual LUE weakness  . TIA (transient ischemic attack)    multiple in past, most recently in 2011  . Type II diabetes mellitus ([place])     Past Surgical History:  Procedure Laterality Date  . CARDIAC CATHETERIZATION  07/2010  . CRANIOTOMY  05/2008   for clipping of an incidental asymptomatic unruptured/notes 02/13/2011  . KNEE ARTHROSCOPY Left   . 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 SHOULDER ARTHROSCOPY W/ ROTATOR CUFF REPAIR Right     Social History   Social History  . Marital status: Married    Spouse name: [name]  . Number of children: 3  . Years of education: HS   Occupational History  . disabled    Social History Main Topics  . Smoking status: Former Smoker    Packs/day: 1.50    Years: 15.00    Types: Cigarettes    Quit date:  10/16/1983  . Smokeless tobacco: Never Used  . Alcohol use No  . Drug use: No  . Sexual activity: No   Other Topics Concern  . Not on file   Social History Narrative   Pt lives at home with spouse.   Caffeine Use: 1 cup daily   Family History  Problem Relation Age of Onset  . Stroke Mother   . Pneumonia Father   . Diabetes Sister   . Diabetes Brother   . Diabetes Paternal Aunt   . Colon cancer Neg Hx       VITAL SIGNS BP (!) 146/67   Pulse 74   Temp 98 F (36.7 C)   Resp (!) 22   Ht 5\' 7"  (1.702 m)   Wt 169 lb (76.7 kg)   SpO2 93%   BMI 26.47 kg/m   Patient's Medications  New Prescriptions   No medications on file  Previous Medications   AMLODIPINE (NORVASC) 5 MG TABLET    Take 5 mg by mouth daily.   CARVEDILOL (COREG) 12.5 MG TABLET    Take 1 tablet (12.5 mg total) by mouth 2 (two) times daily.   CLOPIDOGREL (PLAVIX) 75 MG TABLET    Take 1 tablet by mouth  at bedtime. On hold until 09/01/2016   DICLOFENAC SODIUM (VOLTAREN) 1 % GEL    Apply 4 g topically 4 (four) times daily.   DONEPEZIL (ARICEPT) 10 MG TABLET    Take 1 tablet (10 mg total) by mouth at bedtime.   HYDRALAZINE (APRESOLINE) 50 MG TABLET    Take 75 mg by mouth 4 (four) times daily.    HYPROMELLOSE (NATURAL BALANCE TEARS) 0.4 % SOLN    Place 2 drops into both eyes 2 (two) times daily.    INSULIN ASPART (NOVOLOG) 100 UNIT/ML INJECTION    Inject 0-5 Units into the skin 3 (three) times daily before meals. Inject as per sliding scale: 0-59=0 call MD if less than 60, 60-150=3 units 301-400=5 units call MD if >400    INSULIN GLARGINE (LANTUS) 100 UNIT/ML INJECTION    Inject 10 Units into the skin daily.   LISINOPRIL (PRINIVIL,ZESTRIL) 20 MG TABLET    Give 2 tablets by mouth daily   MEMANTINE (NAMENDA XR) 28 MG CP24 24 HR CAPSULE    Take 1 capsule (28 mg total) by mouth daily.   MIRABEGRON ER (MYRBETRIQ) 50 MG TB24 TABLET    Take 50 mg by mouth daily.   MIRTAZAPINE (REMERON) 7.5 MG TABLET    Take 7.5 mg by mouth  at bedtime.   NITROGLYCERIN (NITROSTAT) 0.4 MG SL TABLET    Place 0.4 mg under the tongue every 5 (five) minutes as needed for chest pain.   OXYBUTYNIN (DITROPAN) 5 MG TABLET    Take 5 mg by mouth 2 (two) times daily.   PHENAZOPYRIDINE (PYRIDIUM) 100 MG TABLET    Take 100 mg by mouth 3 (three) times daily as needed for pain.   POLYETHYL GLYCOL-PROPYL GLYCOL (SYSTANE) 0.4-0.3 % SOLN    Place 1 drop into both eyes 2 (two) times daily.   PREDNISOLONE ACETATE (PRED FORTE) 1 % OPHTHALMIC SUSPENSION    Place 1 drop into the left eye 4 (four) times daily.    SACCHAROMYCES BOULARDII (FLORASTOR) 250 MG CAPSULE    Take 250 mg by mouth 2 (two) times daily. Every 13 day   SERTRALINE (ZOLOFT) 25 MG TABLET    Take 1 tablet (25 mg total) by mouth daily.   SIMVASTATIN (ZOCOR) 20 MG TABLET    Take 1 tablet (20 mg total) by mouth at bedtime.   TAMSULOSIN (FLOMAX) 0.4 MG CAPS CAPSULE    Take 0.4 mg by mouth at bedtime.   TORSEMIDE (DEMADEX) 10 MG TABLET    Take 10 mg by mouth daily.  Modified Medications   No medications on file  Discontinued Medications   No medications on file     SIGNIFICANT DIAGNOSTIC EXAMS   08-28-16: chest x-ray: mild atelectasis left lung bases. No evidence of pneumonia or chf   LABS REVIEWED:   08-16-16: wound culture: mrsa 08-20-16; glucose 205; bun 24.3; creat 1.11; k+ 3.7; na++ 147 08-17-16: glucose 205; bun 16.0; creat 1.09; k+ 3.9; na++ 142 08-28-16: wbc 7.3; hgb 13.1; hct 43.5; mcv 83.2; plt 205; glucose 219; bun 24.1; creat 1.32; k+ 4.1; na++ 142; liver normal albumin 3.6  09-18-16: wbc 11.2; hgb 11.2; hct 36.1; mcv 83.5; plt 180; urine culture: pseudomonas aeruginosa: cipro  09-26-16; wbc 7.5; hgb 11.6; hct 39.7; mcv 82.9; plt 287; glucose 133; bun 13.8; creat 1.0;4; k+ 3.7; na++ 143    Review of Systems  Unable to perform ROS:  dementia  Physical Exam  Constitutional: No distress.  Eyes: Conjunctivae are normal.  Neck:  Neck supple. No JVD present. No thyromegaly  present.  Cardiovascular: Normal rate, regular rhythm and intact distal pulses.   Respiratory: Effort and lung sounds normal. No respiratory distress. He has no wheezes.  GI: Soft. Bowel sounds are normal. He exhibits no distension. There is no tenderness.  Has foley  Musculoskeletal: He exhibits no edema.    Lymphadenopathy:    He has no cervical adenopathy.  Neurological: He is alert.  Skin: Skin is warm and dry. He is not diaphoretic.  Psychiatric: He has a normal mood and affect.    ASSESSMENT/ PLAN:   1. 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 Hypertension:146/67  will continue coreg 12.5 mg twice daily lisinopril 40 mg daily norvasc 5 mg daily   hydralazine  75 mg four times  daily   3. Diabetes: will continue novolog SSI: 60-150=3units; 151-400=5 units lantus 10 units nightly   4. Vascular dementia: will continue aricept 10 mg nightly and namdena xr 28 mg daily   5. Dyslipidemia: will continue zocor 20 mg daily   6. Urine retention: requires foley; will continue flomax 0.4 mg daily ditropan 5 mg twice daily and myrbetriq 50 mg daily   7. Adjustment disorder: will continue zoloft 25 mg daily   8. Lower extremity edema: will continue demadex 10 mg daily   9. Osteoarthritis bilateral knees: will continue voltaren gel 4 gm to both knees four times daily   10. Weight loss: weight 169 pounds; will continue remeron 7.5 mg nightly for total of 30 days     MD is aware of resident's narcotic use and is in agreement with current plan of care. We will attempt to wean resident as apropriate   Ok Edwards NP [facility]  Contact [phone] Monday through Friday 8am- 5pm  After hours call [phone]

## 2017-03-15 DIAGNOSIS — M6281 Muscle weakness (generalized): Secondary | ICD-10-CM | POA: Diagnosis not present

## 2017-03-15 DIAGNOSIS — I639 Cerebral infarction, unspecified: Secondary | ICD-10-CM | POA: Diagnosis not present

## 2017-03-15 DIAGNOSIS — R2689 Other abnormalities of gait and mobility: Secondary | ICD-10-CM | POA: Diagnosis not present

## 2017-03-21 DIAGNOSIS — I739 Peripheral vascular disease, unspecified: Secondary | ICD-10-CM | POA: Diagnosis not present

## 2017-03-21 DIAGNOSIS — E1151 Type 2 diabetes mellitus with diabetic peripheral angiopathy without gangrene: Secondary | ICD-10-CM | POA: Diagnosis not present

## 2017-03-21 DIAGNOSIS — L603 Nail dystrophy: Secondary | ICD-10-CM | POA: Diagnosis not present

## 2017-03-25 ENCOUNTER — Encounter: Payer: Self-pay | Admitting: Internal Medicine

## 2017-03-25 ENCOUNTER — Non-Acute Institutional Stay (SKILLED_NURSING_FACILITY): Payer: PPO | Admitting: Internal Medicine

## 2017-03-25 DIAGNOSIS — F0150 Vascular dementia without behavioral disturbance: Secondary | ICD-10-CM

## 2017-03-25 DIAGNOSIS — I6930 Unspecified sequelae of cerebral infarction: Secondary | ICD-10-CM | POA: Diagnosis not present

## 2017-03-25 DIAGNOSIS — E1169 Type 2 diabetes mellitus with other specified complication: Secondary | ICD-10-CM | POA: Diagnosis not present

## 2017-03-25 DIAGNOSIS — E1149 Type 2 diabetes mellitus with other diabetic neurological complication: Secondary | ICD-10-CM

## 2017-03-25 DIAGNOSIS — E785 Hyperlipidemia, unspecified: Secondary | ICD-10-CM | POA: Diagnosis not present

## 2017-03-25 DIAGNOSIS — R339 Retention of urine, unspecified: Secondary | ICD-10-CM | POA: Diagnosis not present

## 2017-03-25 DIAGNOSIS — I10 Essential (primary) hypertension: Secondary | ICD-10-CM

## 2017-03-25 NOTE — Progress Notes (Signed)
Patient ID: [name], male   DOB: [birth year], 76 y.o.   MRN: [id number]    DATE:  03/25/2017  Location:    [facility] Room Number: 102 A Place of Service: SNF (31)   Extended Emergency Contact Information Primary Emergency Contact: Despain,[name] Address: [address]          [place] [address] [place] Phone: [phone] Relation: Spouse Secondary Emergency Contact: Matthis,[name]  [place] Relation: Daughter  Warden/ranger Does Patient Have a Catering manager?: Yes, Type of Advance Directive: Out of facility DNR (pink MOST or yellow form), Pre-existing out of facility DNR order (yellow form or pink MOST form): Yellow form placed in chart (order not valid for inpatient use);Pink MOST form placed in chart (order not valid for inpatient use), Does patient want to make changes to medical advance directive?: No - Patient declined  Chief Complaint  Patient presents with  . Medical Management of Chronic Issues    Routine Visit    HPI:  76 yo male long term resident seen today for f/u. He has no concerns. No nursing issues. No f/c. Appetite ok. Sleeps well. No recent fall. He is a poor historian due to dementia. Hx obtained from chart.  Hx CVA - he has left hemiparesis. Stable on plavix 75 mg daily  Hypertension - BP stable on coreg 12.5 mg twice daily; lisinopril 40 mg daily; norvasc 5 mg daily; hydralazine  75 mg four times  daily   DM - borderline controlled. A1c 7.6%. CBG 177. No low BS reactions. She takes novolog SSI: 60-150=3units; 151-400=5 units; lantus 10 units nightly.    Vascular dementia - stable on aricept 10 mg nightly and namenda xr 28 mg daily   Dyslipidemia - stable on zocor 20 mg daily. LDL 110  Urinary retention - s/p foley cath. Takes flomax 0.4 mg daily; ditropan 5 mg twice daily; myrbetriq 50 mg daily   Adjustment disorder - mood stable on zoloft 25 mg daily   Lower  extremity edema - stable on demadex 10 mg daily   Osteoarthritis bilateral knees - stable on voltaren gel 4 gm to both knees four times daily   Weight loss- resolved. Now off remeron 7.5 mg nightly. Weight up 19 lbs. Albumin 3.4  Past Medical History:  Diagnosis Date  . Arthritis    "maybe"  . 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 CHF (congestive heart failure) ([place])   . Depression   . 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 Hyperlipemia   . Hypertension   . Sleep apnea    "my machine [place]; got to get me another one" (09/01/2014)  . Stroke [facility]) 2009   left thalamic infarct, residual LUE weakness  . TIA (transient ischemic attack)    multiple in past, most recently in 2011  . Type II diabetes mellitus ([place])     Past Surgical History:  Procedure Laterality Date  . CARDIAC CATHETERIZATION  07/2010  . CRANIOTOMY  05/2008   for clipping of an incidental asymptomatic unruptured/notes 02/13/2011  . KNEE ARTHROSCOPY Left   . 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 SHOULDER ARTHROSCOPY W/ ROTATOR CUFF REPAIR Right     Patient Care Team: Aldine Contes, MD as PCP - General (Internal  Medicine) Webb Laws, [place] as Referring Physician (Optometry) Bjorn Loser, MD as Consulting Physician (Urology) Hayden Pedro, MD as Consulting Physician (Ophthalmology) Borum, Jaci Standard, MD as Referring Physician (Internal Medicine)  Social History   Social History  . Marital status: Married    Spouse name: [name]  . Number of children: 3  . Years of education: HS   Occupational History  . disabled    Social History Main Topics  . Smoking status: Former Smoker    Packs/day: 1.50    Years: 15.00    Types: Cigarettes    Quit date: 10/16/1983  . Smokeless tobacco: Never Used  . Alcohol use No  . Drug  use: No  . Sexual activity: No   Other Topics Concern  . Not on file   Social History Narrative   Pt lives at home with spouse.   Caffeine Use: 1 cup daily     reports that he quit smoking about 33 years ago. His smoking use included Cigarettes. He has a 22.50 pack-year smoking history. He has never used smokeless tobacco. He reports that he does not drink alcohol or use drugs.  Family History  Problem Relation Age of Onset  . Stroke Mother   . Pneumonia Father   . Diabetes Sister   . Diabetes Brother   . Diabetes Paternal Aunt   . Colon cancer Neg Hx    Family Status  Relation Status  . Mother Deceased at age 63  . Father Deceased at age 55  . Sister (Not Specified)  . Brother (Not Specified)  . Ethlyn Daniels (Not Specified)  . Neg Hx (Not Specified)    Immunization History  Administered Date(s) Administered  . Influenza Split 06/25/2012  . Influenza Whole 07/18/2007, 08/18/2008, 08/09/2009, 06/14/2010, 06/20/2011  . Influenza,inj,Quad PF,36+ Mos 06/24/2013, 06/08/2014  . Influenza-Unspecified 08/20/2016  . PPD Test 04/25/2016, 06/29/2016, 07/06/2016  . Pneumococcal Conjugate-13 01/06/2015  . Pneumococcal Polysaccharide-23 05/30/2008  . Tdap 06/20/2011    Allergies  Allergen Reactions  . Pioglitazone Other (See Comments)    Edema   . Rosiglitazone Maleate Other (See Comments)    edema    Medications: Patient's Medications  New Prescriptions   No medications on file  Previous Medications   AMLODIPINE (NORVASC) 5 MG TABLET    Take 5 mg by mouth daily.   CARVEDILOL (COREG) 12.5 MG TABLET    Take 1 tablet (12.5 mg total) by mouth 2 (two) times daily.   CLOPIDOGREL (PLAVIX) 75 MG TABLET    Take 1 tablet by mouth at bedtime. On hold until 09/01/2016   DICLOFENAC SODIUM (VOLTAREN) 1 % GEL    Apply 4 g topically 4 (four) times daily.   DONEPEZIL (ARICEPT) 10 MG TABLET    Take 1 tablet (10 mg total) by mouth at bedtime.   HYDRALAZINE (APRESOLINE) 50 MG TABLET    Take  75 mg by mouth 4 (four) times daily.    INSULIN ASPART (NOVOLOG) 100 UNIT/ML INJECTION    Inject 0-5 Units into the skin 3 (three) times daily before meals. Inject as per sliding scale: 0-59=0 call MD if less than 60, 60-150=3 units 301-400=5 units call MD if >400    INSULIN GLARGINE (LANTUS) 100 UNIT/ML INJECTION    Inject 10 Units into the skin daily.   LISINOPRIL (PRINIVIL,ZESTRIL) 20 MG TABLET    Give 2 tablets by mouth daily   MEMANTINE (NAMENDA XR) 28 MG CP24 24 HR CAPSULE    Take 1 capsule (28 mg  total) by mouth daily.   MIRABEGRON ER (MYRBETRIQ) 50 MG TB24 TABLET    Take 50 mg by mouth daily.   MIRTAZAPINE (REMERON) 7.5 MG TABLET    Take 7.5 mg by mouth at bedtime.   NITROGLYCERIN (NITROSTAT) 0.4 MG SL TABLET    Place 0.4 mg under the tongue every 5 (five) minutes as needed for chest pain.   OXYBUTYNIN (DITROPAN) 5 MG TABLET    Take 5 mg by mouth 2 (two) times daily.   PHENAZOPYRIDINE (PYRIDIUM) 100 MG TABLET    Take 100 mg by mouth 3 (three) times daily as needed for pain.   SACCHAROMYCES BOULARDII (FLORASTOR) 250 MG CAPSULE    Take 250 mg by mouth 2 (two) times daily. Every 13 day   SERTRALINE (ZOLOFT) 25 MG TABLET    Take 1 tablet (25 mg total) by mouth daily.   SIMVASTATIN (ZOCOR) 20 MG TABLET    Take 1 tablet (20 mg total) by mouth at bedtime.   TAMSULOSIN (FLOMAX) 0.4 MG CAPS CAPSULE    Take 0.4 mg by mouth at bedtime.   TORSEMIDE (DEMADEX) 10 MG TABLET    Take 10 mg by mouth daily.  Modified Medications   No medications on file  Discontinued Medications   HYPROMELLOSE (NATURAL BALANCE TEARS) 0.4 % SOLN    Place 2 drops into both eyes 2 (two) times daily.    POLYETHYL GLYCOL-PROPYL GLYCOL (SYSTANE) 0.4-0.3 % SOLN    Place 1 drop into both eyes 2 (two) times daily.   PREDNISOLONE ACETATE (PRED FORTE) 1 % OPHTHALMIC SUSPENSION    Place 1 drop into the left eye 4 (four) times daily.     Review of Systems  Unable to perform ROS: Dementia    Vitals:   03/25/17 1334  BP: 136/84    Pulse: (!) 55  Resp: 16  Temp: 98.2 F (36.8 C)  TempSrc: Oral  SpO2: 95%  Weight: 188 lb 1.6 oz (85.3 kg)   Body mass index is 29.46 kg/m.  Physical Exam  Constitutional: He appears well-developed and well-nourished.  Sitting on bed in NAD, frail appearing  HENT:  Mouth/Throat: Oropharynx is clear and moist. No oropharyngeal exudate.  MMM; no oral thrush  Eyes: Pupils are equal, round, and reactive to light. No scleral icterus.  Neck: Neck supple. Carotid bruit is not present.  Cardiovascular: Normal rate, regular rhythm and intact distal pulses.  Exam reveals no gallop and no friction rub.   Murmur (1/6 SEm) heard. No distal LE edema. No calf TTP  Pulmonary/Chest: Effort normal and breath sounds normal. He has no wheezes. He has no rales. He exhibits no tenderness.  Prolonged expiratory phase  Abdominal: Soft. Normal appearance and bowel sounds are normal. He exhibits no distension, no abdominal bruit, no pulsatile midline mass and no mass. There is no hepatomegaly. There is no tenderness. There is no rigidity, no rebound and no guarding. No hernia.  obese  Genitourinary:  Genitourinary Comments: Foley DTG clear yellow urine  Musculoskeletal: He exhibits edema.  Lymphadenopathy:    He has no cervical adenopathy.  Neurological: He is alert.  Skin: Skin is warm and dry. No rash noted.  Psychiatric: He has a normal mood and affect. His behavior is normal.     Labs reviewed: Nursing Home on 03/25/2017  Component Date Value Ref Range Status  . Hemoglobin 01/23/2017 12.1* 13.5 - 17.5 Final  . HCT 01/23/2017 40* 41 - 53 Final  . Platelets 01/23/2017 184  150 - 399  Final  . WBC 01/23/2017 6.4   Final  . Glucose 01/23/2017 118   Final  . BUN 01/23/2017 18  4 - 21 Final  . Creatinine 01/23/2017 0.9  0.6 - 1.3 Final  . Potassium 01/23/2017 3.6  3.4 - 5.3 Final  . Sodium 01/23/2017 148* 137 - 147 Final  . Hemoglobin A1C 01/23/2017 7.6   Final  Abstract on 02/26/2017   Component Date Value Ref Range Status  . Hemoglobin 02/25/2017 11.8* 13.5 - 17.5 g/dL Final  . HCT 02/25/2017 39* 41 - 53 % Final  . Neutrophils Absolute 02/25/2017 4  /L Final  . Platelets 02/25/2017 181  150 - 399 K/L Final  . WBC 02/25/2017 7.0  10^3/mL Final  . Glucose 02/25/2017 160  mg/dL Final  . BUN 02/25/2017 18  4 - 21 mg/dL Final  . Creatinine 02/25/2017 0.9  0.6 - 1.3 mg/dL Final  . Potassium 02/25/2017 3.8  3.4 - 5.3 mmol/L Final  . Sodium 02/25/2017 145  137 - 147 mmol/L Final  . Alkaline Phosphatase 02/25/2017 74  25 - 125 U/L Final  . ALT 02/25/2017 7* 10 - 40 U/L Final  . AST 02/25/2017 7* 14 - 40 U/L Final  . Bilirubin, Total 02/25/2017 0.5  mg/dL Final  Abstract on 01/25/2017  Component Date Value Ref Range Status  . Hemoglobin 01/23/2017 12.1* 13.5 - 17.5 g/dL Final  . HCT 01/23/2017 40* 41 - 53 % Final  . Neutrophils Absolute 01/23/2017 4  /L Final  . Platelets 01/23/2017 184  150 - 399 K/L Final  . WBC 01/23/2017 6.4  10^3/mL Final  . Glucose 01/23/2017 118  mg/dL Final  . BUN 01/23/2017 18  4 - 21 mg/dL Final  . Creatinine 01/23/2017 0.9  0.6 - 1.3 mg/dL Final  . Potassium 01/23/2017 3.6  3.4 - 5.3 mmol/L Final  . Sodium 01/23/2017 148* 137 - 147 mmol/L Final  Nursing Home on 12/26/2016  Component Date Value Ref Range Status  . Hemoglobin 09/26/2016 11.6* 13.5 - 17.5 g/dL Final  . HCT 09/26/2016 40* 41 - 53 % Final  . Neutrophils Absolute 09/26/2016 4  /L Final  . Platelets 09/26/2016 287  150 - 399 K/L Final  . WBC 09/26/2016 7.4  10^3/mL Final  . Glucose 09/26/2016 133  mg/dL Final  . BUN 09/26/2016 14  4 - 21 mg/dL Final  . Creatinine 09/26/2016 1.0  0.6 - 1.3 mg/dL Final  . Potassium 09/26/2016 3.7  3.4 - 5.3 mmol/L Final  . Sodium 09/26/2016 143  137 - 147 mmol/L Final    No results found.   Assessment/Plan   ICD-10-CM   1. Type II diabetes mellitus with neurological manifestations ([place]) E11.49   2. Vascular dementia without  behavioral disturbance F01.50   3. Essential hypertension I10   4. Hyperlipidemia associated with type 2 diabetes mellitus ([place]) E11.69    E78.5   5. Urinary retention R33.9   6. History of CVA with residual deficit I69.30     Cont current meds as ordered  PT/OT as ordered  Foley cath care as indicated  Will follow   Aluna Whiston S. Perlie Gold  [facility] [address] [place], [place] [address] [phone] Cell (Monday-Friday 8 AM - 5 PM) [phone] After 5 PM and follow prompts

## 2017-04-03 ENCOUNTER — Encounter (INDEPENDENT_AMBULATORY_CARE_PROVIDER_SITE_OTHER): Payer: Self-pay

## 2017-04-03 ENCOUNTER — Ambulatory Visit (INDEPENDENT_AMBULATORY_CARE_PROVIDER_SITE_OTHER): Payer: PPO | Admitting: Ophthalmology

## 2017-04-15 DIAGNOSIS — I639 Cerebral infarction, unspecified: Secondary | ICD-10-CM | POA: Diagnosis not present

## 2017-04-15 DIAGNOSIS — M6281 Muscle weakness (generalized): Secondary | ICD-10-CM | POA: Diagnosis not present

## 2017-04-15 DIAGNOSIS — R2689 Other abnormalities of gait and mobility: Secondary | ICD-10-CM | POA: Diagnosis not present

## 2017-04-18 ENCOUNTER — Non-Acute Institutional Stay (SKILLED_NURSING_FACILITY): Payer: PPO

## 2017-04-18 DIAGNOSIS — Z0000 Encounter for general adult medical examination without abnormal findings: Secondary | ICD-10-CM

## 2017-04-18 NOTE — Progress Notes (Signed)
Subjective:   [name] is a 76 y.o. male who presents for Medicare Annual/Subsequent preventive examination at [facility]  Objective:    Vitals: BP 132/60 (BP Location: Left Arm, Patient Position: Sitting)   Pulse (!) 53   Temp 98.4 F (36.9 C) (Oral)   Ht 5\' 7"  (1.702 m)   Wt 188 lb (85.3 kg)   SpO2 94%   BMI 29.44 kg/m   Body mass index is 29.44 kg/m.  Tobacco History  Smoking Status  . Former Smoker  . Packs/day: 2.00  . Years: 20.00  . Types: Cigarettes  . Quit date: 10/16/1983  Smokeless Tobacco  . Never Used     Counseling given: Not Answered   Past Medical History:  Diagnosis Date  . Arthritis    "maybe"  . 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 CHF (congestive heart failure) ([place])   . Depression   . 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 Hyperlipemia   . Hypertension   . Sleep apnea    "my machine [place]; got to get me another one" (09/01/2014)  . Stroke [facility]) 2009   left thalamic infarct, residual LUE weakness  . TIA (transient ischemic attack)    multiple in past, most recently in 2011  . Type II diabetes mellitus ([place])    Past Surgical History:  Procedure Laterality Date  . CARDIAC CATHETERIZATION  07/2010  . CRANIOTOMY  05/2008   for clipping of an incidental asymptomatic unruptured/notes 02/13/2011  . KNEE ARTHROSCOPY Left   . 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 SHOULDER ARTHROSCOPY W/ ROTATOR CUFF REPAIR Right    Family History  Problem Relation Age of Onset  . Stroke Mother   . Pneumonia Father   . Diabetes Sister   . Diabetes Brother   . Diabetes Paternal Aunt   . Colon cancer Neg Hx    History  Sexual Activity  . Sexual activity: No    Outpatient Encounter Prescriptions as of 04/18/2017  Medication Sig  . amLODipine (NORVASC) 5 MG  tablet Take 5 mg by mouth daily.  . carvedilol (COREG) 12.5 MG tablet Take 1 tablet (12.5 mg total) by mouth 2 (two) times daily.  . clopidogrel (PLAVIX) 75 MG tablet Take 1 tablet by mouth at bedtime. On hold until 09/01/2016  . diclofenac sodium (VOLTAREN) 1 % GEL Apply 4 g topically 4 (four) times daily.  Marland Kitchen donepezil (ARICEPT) 10 MG tablet Take 1 tablet (10 mg total) by mouth at bedtime.  . hydrALAZINE (APRESOLINE) 50 MG tablet Take 75 mg by mouth 4 (four) times daily.   . insulin aspart (NOVOLOG) 100 UNIT/ML injection Inject 0-5 Units into the skin 3 (three) times daily before meals. Inject as per sliding scale: 0-59=0 call MD if less than 60, 60-150=3 units 301-400=5 units call MD if >400   . insulin glargine (LANTUS) 100 UNIT/ML injection Inject 10 Units into the skin daily.  Marland Kitchen lisinopril (PRINIVIL,ZESTRIL) 20 MG tablet Give 2 tablets by mouth daily  . memantine (NAMENDA XR) 28 MG CP24 24 hr capsule Take 1 capsule (28 mg total) by mouth daily.  . mirabegron ER (MYRBETRIQ) 50 MG TB24 tablet Take 50 mg by mouth daily.  . nitroGLYCERIN (NITROSTAT) 0.4 MG SL tablet Place 0.4 mg under the tongue every  5 (five) minutes as needed for chest pain.  Marland Kitchen oxybutynin (DITROPAN) 5 MG tablet Take 5 mg by mouth 2 (two) times daily.  . phenazopyridine (PYRIDIUM) 100 MG tablet Take 100 mg by mouth 3 (three) times daily as needed for pain.  Marland Kitchen saccharomyces boulardii (FLORASTOR) 250 MG capsule Take 250 mg by mouth 2 (two) times daily. Every 13 day  . sertraline (ZOLOFT) 25 MG tablet Take 1 tablet (25 mg total) by mouth daily.  . simvastatin (ZOCOR) 20 MG tablet Take 1 tablet (20 mg total) by mouth at bedtime.  . tamsulosin (FLOMAX) 0.4 MG CAPS capsule Take 0.4 mg by mouth at bedtime.  . torsemide (DEMADEX) 10 MG tablet Take 10 mg by mouth daily.  . mirtazapine (REMERON) 7.5 MG tablet Take 7.5 mg by mouth at bedtime.   No facility-administered encounter medications on file as of 04/18/2017.     Activities of  Daily Living In your present state of health, do you have any difficulty performing the following activities: 04/18/2017 09/12/2016  Hearing? Y N  Vision? N N  Difficulty concentrating or making decisions? Y N  Walking or climbing stairs? Y Y  Dressing or bathing? Y Y  Doing errands, shopping? Tempie Donning  Preparing Food and eating ? Y -  Using the Toilet? Y -  In the past six months, have you accidently leaked urine? Y -  Do you have problems with loss of bowel control? Y -  Managing your Medications? Y -  Managing your Finances? Y -  Housekeeping or managing your Housekeeping? Y -  Some recent data might be hidden    Patient Care Team: Aldine Contes, MD as PCP - General (Internal Medicine) Webb Laws, Elliott as Referring Physician (Optometry) Bjorn Loser, MD as Consulting Physician (Urology) Hayden Pedro, MD as Consulting Physician (Ophthalmology) Rondell Reams, Jaci Standard, MD as Referring Physician (Internal Medicine)   Assessment:     Exercise Activities and Dietary recommendations Current Exercise Habits: The patient does not participate in regular exercise at present, Exercise limited by: None identified  Goals    . Blood Pressure < 140/90    . HEMOGLOBIN A1C < 7.0    . Maintain Lifestyle          Pt will maintain lifestyle.     Marland Kitchen Prevent Falls      Fall Risk Fall Risk  04/18/2017 09/12/2016 05/24/2016 05/09/2016 04/30/2016  Falls in the past year? No Yes Yes Yes Yes  Number falls in past yr: - 2 or more 2 or more - 2 or more  Injury with Fall? - Yes Yes Yes Yes  Risk Factor Category  - High Fall Risk High Fall Risk High Fall Risk High Fall Risk  Risk for fall due to : - History of fall(s);Impaired balance/gait;Impaired mobility History of fall(s);Impaired balance/gait;Impaired mobility History of fall(s);Impaired balance/gait;Impaired mobility History of fall(s);Impaired balance/gait;Impaired mobility;Mental status change  Risk for fall due to (comments): - - - - -    Follow up - Falls prevention discussed Education provided;Falls prevention discussed Falls prevention discussed Education provided;Falls prevention discussed   Depression Screen PHQ 2/9 Scores 04/18/2017 09/12/2016 05/24/2016 05/09/2016  PHQ - 2 Score 0 0 0 0  PHQ- 9 Score - - - -    Cognitive Function MMSE - Mini Mental State Exam 12/01/2014  Orientation to time 3  Orientation to Place 5  Registration 3  Attention/ Calculation 1  Recall 3  Language- name 2 objects 2  Language- repeat 1  Language- follow 3 step command 3  Language- read & follow direction 1  Write a sentence 1  Copy design 0  Total score 23     6CIT Screen 04/18/2017  What Year? 0 points  What month? 0 points  What time? 3 points  Count back from 20 0 points  Months in reverse 4 points  Repeat phrase 10 points  Total Score 17    Immunization History  Administered Date(s) Administered  . Influenza Split 06/25/2012  . Influenza Whole 07/18/2007, 08/18/2008, 08/09/2009, 06/14/2010, 06/20/2011  . Influenza,inj,Quad PF,36+ Mos 06/24/2013, 06/08/2014  . Influenza-Unspecified 08/20/2016  . PPD Test 04/25/2016, 06/29/2016, 07/06/2016  . Pneumococcal Conjugate-13 01/06/2015  . Pneumococcal Polysaccharide-23 05/30/2008  . Tdap 06/20/2011   Screening Tests Health Maintenance  Topic Date Due  . FOOT EXAM  02/22/2018 (Originally 08/21/2016)  . LIPID PANEL  02/22/2018 (Originally 06/09/2015)  . HEMOGLOBIN A1C  02/22/2018 (Originally 04/24/2017)  . OPHTHALMOLOGY EXAM  02/22/2018 (Originally 04/12/2016)  . INFLUENZA VACCINE  05/15/2017  . COLONOSCOPY  08/29/2017  . TETANUS/TDAP  06/19/2021  . PNA vac Low Risk Adult  Completed      Plan:    I have personally reviewed and addressed the Medicare Annual Wellness questionnaire and have noted the following in the patient's chart:  A. Medical and social history B. Use of alcohol, tobacco or illicit drugs  C. Current medications and supplements D. Functional ability and  status E.  Nutritional status F.  Physical activity G. Advance directives H. List of other physicians I.  Hospitalizations, surgeries, and ER visits in previous 12 months J.  [place] to include hearing, vision, cognitive, depression L. Referrals and appointments - none  In addition, I have reviewed and discussed with patient certain preventive protocols, quality metrics, and best practice recommendations. A written personalized care plan for preventive services as well as general preventive health recommendations were provided to patient.  See attached scanned questionnaire for additional information.   Signed,   Rich Reining, RN Nurse Health Advisor   Quick Notes   Health Maintenance:HgA1c, eye exam due     Abnormal Screen: 6 Cit-17     Patient Concerns: Gums in mouth hurt for about a month.     Nurse Concerns: None

## 2017-04-18 NOTE — Patient Instructions (Signed)
[name] , Thank you for taking time to come for your Medicare Wellness Visit. I appreciate your ongoing commitment to your health goals. Please review the following plan we discussed and let me know if I can assist you in the future.   Screening recommendations/referrals: Colonoscopy up to date, long term pt Recommended yearly ophthalmology/optometry visit for glaucoma screening and checkup Recommended yearly dental visit for hygiene and checkup  Vaccinations: Influenza vaccine up to date. Due 08/20/17 Pneumococcal vaccine up to date Tdap vaccine due 06/19/1921 Shingles vaccine up to date  Advanced directives: DNR in chart. Need copy of rest.  Conditions/risks identified: None  Next appointment: Dr. Eulas Post makes rounds  Preventive Care 67 Years and Older, Male Preventive care refers to lifestyle choices and visits with your health care provider that can promote health and wellness. What does preventive care include?  A yearly physical exam. This is also called an annual well check.  Dental exams once or twice a year.  Routine eye exams. Ask your health care provider how often you should have your eyes checked.  Personal lifestyle choices, including:  Daily care of your teeth and gums.  Regular physical activity.  Eating a healthy diet.  Avoiding tobacco and drug use.  Limiting alcohol use.  Practicing safe sex.  Taking low doses of aspirin every day.  Taking vitamin and mineral supplements as recommended by your health care provider. What happens during an annual well check? The services and screenings done by your health care provider during your annual well check will depend on your age, overall health, lifestyle risk factors, and family history of disease. Counseling  Your health care provider may ask you questions about your:  Alcohol use.  Tobacco use.  Drug use.  Emotional well-being.  Home and relationship well-being.  Sexual activity.  Eating  habits.  History of falls.  Memory and ability to understand (cognition).  Work and work Statistician. Screening  You may have the following tests or measurements:  Height, weight, and BMI.  Blood pressure.  Lipid and cholesterol levels. These may be checked every 5 years, or more frequently if you are over 68 years old.  Skin check.  Lung cancer screening. You may have this screening every year starting at age 36 if you have a 30-pack-year history of smoking and currently smoke or have quit within the past 15 years.  Fecal occult blood test (FOBT) of the stool. You may have this test every year starting at age 69.  Flexible sigmoidoscopy or colonoscopy. You may have a sigmoidoscopy every 5 years or a colonoscopy every 10 years starting at age 14.  Prostate cancer screening. Recommendations will vary depending on your family history and other risks.  Hepatitis C blood test.  Hepatitis B blood test.  Sexually transmitted disease (STD) testing.  Diabetes screening. This is done by checking your blood sugar (glucose) after you have not eaten for a while (fasting). You may have this done every 1-3 years.  Abdominal aortic aneurysm (AAA) screening. You may need this if you are a current or former smoker.  Osteoporosis. You may be screened starting at age 16 if you are at high risk. Talk with your health care provider about your test results, treatment options, and if necessary, the need for more tests. Vaccines  Your health care provider may recommend certain vaccines, such as:  Influenza vaccine. This is recommended every year.  Tetanus, diphtheria, and acellular pertussis (Tdap, Td) vaccine. You may need a Td booster  every 10 years.  Zoster vaccine. You may need this after age 47.  Pneumococcal 13-valent conjugate (PCV13) vaccine. One dose is recommended after age 74.  Pneumococcal polysaccharide (PPSV23) vaccine. One dose is recommended after age 79. Talk to your health  care provider about which screenings and vaccines you need and how often you need them. This information is not intended to replace advice given to you by your health care provider. Make sure you discuss any questions you have with your health care provider. Document Released: 10/28/2015 Document Revised: 06/20/2016 Document Reviewed: 08/02/2015 Elsevier Interactive Patient Education  2017 [place] Prevention in the Home Falls can cause injuries. They can happen to people of all ages. There are many things you can do to make your home safe and to help prevent falls. What can I do on the outside of my home?  Regularly fix the edges of walkways and driveways and fix any cracks.  Remove anything that might make you trip as you walk through a door, such as a raised step or threshold.  Trim any bushes or trees on the path to your home.  Use bright outdoor lighting.  Clear any walking paths of anything that might make someone trip, such as rocks or tools.  Regularly check to see if handrails are loose or broken. Make sure that both sides of any steps have handrails.  Any raised decks and porches should have guardrails on the edges.  Have any leaves, snow, or ice cleared regularly.  Use sand or salt on walking paths during winter.  Clean up any spills in your garage right away. This includes oil or grease spills. What can I do in the bathroom?  Use night lights.  Install grab bars by the toilet and in the tub and shower. Do not use towel bars as grab bars.  Use non-skid mats or decals in the tub or shower.  If you need to sit down in the shower, use a plastic, non-slip stool.  Keep the floor dry. Clean up any water that spills on the floor as soon as it happens.  Remove soap buildup in the tub or shower regularly.  Attach bath mats securely with double-sided non-slip rug tape.  Do not have throw rugs and other things on the floor that can make you trip. What can I do  in the bedroom?  Use night lights.  Make sure that you have a light by your bed that is easy to reach.  Do not use any sheets or blankets that are too big for your bed. They should not hang down onto the floor.  Have a firm chair that has side arms. You can use this for support while you get dressed.  Do not have throw rugs and other things on the floor that can make you trip. What can I do in the kitchen?  Clean up any spills right away.  Avoid walking on wet floors.  Keep items that you use a lot in easy-to-reach places.  If you need to reach something above you, use a strong step stool that has a grab bar.  Keep electrical cords out of the way.  Do not use floor polish or wax that makes floors slippery. If you must use wax, use non-skid floor wax.  Do not have throw rugs and other things on the floor that can make you trip. What can I do with my stairs?  Do not leave any items on the stairs.  Make  sure that there are handrails on both sides of the stairs and use them. Fix handrails that are broken or loose. Make sure that handrails are as long as the stairways.  Check any carpeting to make sure that it is firmly attached to the stairs. Fix any carpet that is loose or worn.  Avoid having throw rugs at the top or bottom of the stairs. If you do have throw rugs, attach them to the floor with carpet tape.  Make sure that you have a light switch at the top of the stairs and the bottom of the stairs. If you do not have them, ask someone to add them for you. What else can I do to help prevent falls?  Wear shoes that:  Do not have high heels.  Have rubber bottoms.  Are comfortable and fit you well.  Are closed at the toe. Do not wear sandals.  If you use a stepladder:  Make sure that it is fully opened. Do not climb a closed stepladder.  Make sure that both sides of the stepladder are locked into place.  Ask someone to hold it for you, if possible.  Clearly mark  and make sure that you can see:  Any grab bars or handrails.  First and last steps.  Where the edge of each step is.  Use tools that help you move around (mobility aids) if they are needed. These include:  Canes.  Walkers.  Scooters.  Crutches.  Turn on the lights when you go into a dark area. Replace any light bulbs as soon as they burn out.  Set up your furniture so you have a clear path. Avoid moving your furniture around.  If any of your floors are uneven, fix them.  If there are any pets around you, be aware of where they are.  Review your medicines with your doctor. Some medicines can make you feel dizzy. This can increase your chance of falling. Ask your doctor what other things that you can do to help prevent falls. This information is not intended to replace advice given to you by your health care provider. Make sure you discuss any questions you have with your health care provider. Document Released: 07/28/2009 Document Revised: 03/08/2016 Document Reviewed: 11/05/2014 Elsevier Interactive Patient Education  2017 [employer].

## 2017-04-24 ENCOUNTER — Non-Acute Institutional Stay (SKILLED_NURSING_FACILITY): Payer: PPO | Admitting: Adult Health

## 2017-04-24 DIAGNOSIS — N319 Neuromuscular dysfunction of bladder, unspecified: Secondary | ICD-10-CM

## 2017-04-24 DIAGNOSIS — F0150 Vascular dementia without behavioral disturbance: Secondary | ICD-10-CM | POA: Diagnosis not present

## 2017-04-24 DIAGNOSIS — E1149 Type 2 diabetes mellitus with other diabetic neurological complication: Secondary | ICD-10-CM | POA: Diagnosis not present

## 2017-04-24 DIAGNOSIS — E1169 Type 2 diabetes mellitus with other specified complication: Secondary | ICD-10-CM | POA: Diagnosis not present

## 2017-04-24 DIAGNOSIS — I6350 Cerebral infarction due to unspecified occlusion or stenosis of unspecified cerebral artery: Secondary | ICD-10-CM

## 2017-04-24 DIAGNOSIS — R339 Retention of urine, unspecified: Secondary | ICD-10-CM

## 2017-04-24 DIAGNOSIS — F4323 Adjustment disorder with mixed anxiety and depressed mood: Secondary | ICD-10-CM

## 2017-04-24 DIAGNOSIS — R609 Edema, unspecified: Secondary | ICD-10-CM

## 2017-04-24 DIAGNOSIS — IMO0002 Reserved for concepts with insufficient information to code with codable children: Secondary | ICD-10-CM

## 2017-04-24 DIAGNOSIS — G8192 Hemiplegia, unspecified affecting left dominant side: Secondary | ICD-10-CM

## 2017-04-24 DIAGNOSIS — I639 Cerebral infarction, unspecified: Secondary | ICD-10-CM

## 2017-04-24 DIAGNOSIS — E785 Hyperlipidemia, unspecified: Secondary | ICD-10-CM | POA: Diagnosis not present

## 2017-04-24 DIAGNOSIS — I10 Essential (primary) hypertension: Secondary | ICD-10-CM | POA: Diagnosis not present

## 2017-04-24 NOTE — Progress Notes (Signed)
Location:   [facility] Room Number: 672 Place of Service:  SNF (31)   CODE STATUS: dnr  Allergies  Allergen Reactions  . Pioglitazone Other (See Comments)    Edema   . Rosiglitazone Maleate Other (See Comments)    edema    Chief Complaint  Patient presents with  . Medical Management of Chronic Issues    HPI:  He is 76 year old long term resident of this facility being seen for the management of his chronic illnesses. He is stable. He does get out of bed daily. He is unable to fully participate; but does tell me that he is feeling good. There are no nursing concerns at this time.    Past Medical History:  Diagnosis Date  . Arthritis    "maybe"  . 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 CHF (congestive heart failure) ([place])   . Depression   . 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 Hyperlipemia   . Hypertension   . Sleep apnea    "my machine [place]; got to get me another one" (09/01/2014)  . Stroke [facility]) 2009   left thalamic infarct, residual LUE weakness  . TIA (transient ischemic attack)    multiple in past, most recently in 2011  . Type II diabetes mellitus ([place])     Past Surgical History:  Procedure Laterality Date  . CARDIAC CATHETERIZATION  07/2010  . CRANIOTOMY  05/2008   for clipping of an incidental asymptomatic unruptured/notes 02/13/2011  . KNEE ARTHROSCOPY Left   . 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 SHOULDER ARTHROSCOPY W/ ROTATOR CUFF REPAIR Right     Social History   Social History  . Marital status: Married    Spouse name: [name]  . Number of children: 3  . Years of education: HS   Occupational History  . disabled    Social History Main Topics  . Smoking status: Former Smoker    Packs/day: 2.00    Years: 20.00    Types: Cigarettes    Quit date:  10/16/1983  . Smokeless tobacco: Never Used  . Alcohol use No  . Drug use: No  . Sexual activity: No   Other Topics Concern  . Not on file   Social History Narrative   Pt lives at home with spouse.   Caffeine Use: 1 cup daily   Family History  Problem Relation Age of Onset  . Stroke Mother   . Pneumonia Father   . Diabetes Sister   . Diabetes Brother   . Diabetes Paternal Aunt   . Colon cancer Neg Hx       VITAL SIGNS BP 138/80   Pulse 85   Temp 98 F (36.7 C)   Resp 16   Ht 5\' 7"  (1.702 m)   Wt 182 lb 14.4 oz (83 kg)   SpO2 95%   BMI 28.65 kg/m   Patient's Medications  New Prescriptions   No medications on file  Previous Medications   AMLODIPINE (NORVASC) 5 MG TABLET    Take 5 mg by mouth daily.   CARVEDILOL (COREG) 12.5 MG TABLET    Take 1 tablet (12.5 mg total) by mouth 2 (two) times daily.   CLOPIDOGREL (PLAVIX) 75 MG TABLET    Take 1 tablet by mouth at bedtime.  On hold until 09/01/2016   DICLOFENAC SODIUM (VOLTAREN) 1 % GEL    Apply 4 g topically 4 (four) times daily.   DONEPEZIL (ARICEPT) 10 MG TABLET    Take 1 tablet (10 mg total) by mouth at bedtime.   HYDRALAZINE (APRESOLINE) 50 MG TABLET    Take 75 mg by mouth 4 (four) times daily.    HYDROXYPROPYL METHYLCELLULOSE / HYPROMELLOSE (ISOPTO TEARS / GONIOVISC) 2.5 % OPHTHALMIC SOLUTION    Place 2 drops into both eyes 2 (two) times daily.   INSULIN ASPART (NOVOLOG) 100 UNIT/ML INJECTION    Inject 0-5 Units into the skin 3 (three) times daily before meals. Inject as per sliding scale: 0-59=0 call MD if less than 60, 60-150=3 units 301-400=5 units call MD if >400    INSULIN GLARGINE (LANTUS) 100 UNIT/ML INJECTION    Inject 12 Units into the skin daily.    LISINOPRIL (PRINIVIL,ZESTRIL) 20 MG TABLET    Take 40 mg by mouth daily. Give 2 tablets by mouth daily    MEMANTINE (NAMENDA XR) 28 MG CP24 24 HR CAPSULE    Take 1 capsule (28 mg total) by mouth daily.   MIRABEGRON ER (MYRBETRIQ) 50 MG TB24 TABLET    Take 50 mg by  mouth daily.   NITROGLYCERIN (NITROSTAT) 0.4 MG SL TABLET    Place 0.4 mg under the tongue every 5 (five) minutes as needed for chest pain.   OXYBUTYNIN (DITROPAN) 5 MG TABLET    Take 5 mg by mouth 2 (two) times daily.   PHENAZOPYRIDINE (PYRIDIUM) 100 MG TABLET    Take 100 mg by mouth 3 (three) times daily as needed for pain.   POLYETHYL GLYCOL-PROPYL GLYCOL (SYSTANE) 0.4-0.3 % SOLN    Apply 1 drop to eye 2 (two) times daily. Both eyes   PREDNISOLONE ACETATE (PRED FORTE) 1 % OPHTHALMIC SUSPENSION    Place 1 drop into the left eye 4 (four) times daily.   SACCHAROMYCES BOULARDII (FLORASTOR) 250 MG CAPSULE    Take 250 mg by mouth 2 (two) times daily. Every 13 day   SERTRALINE (ZOLOFT) 25 MG TABLET    Take 1 tablet (25 mg total) by mouth daily.   SIMVASTATIN (ZOCOR) 20 MG TABLET    Take 1 tablet (20 mg total) by mouth at bedtime.   TAMSULOSIN (FLOMAX) 0.4 MG CAPS CAPSULE    Take 0.4 mg by mouth at bedtime.   TORSEMIDE (DEMADEX) 10 MG TABLET    Take 10 mg by mouth daily.  Modified Medications   No medications on file  Discontinued Medications   MIRTAZAPINE (REMERON) 7.5 MG TABLET    Take 7.5 mg by mouth at bedtime.     SIGNIFICANT DIAGNOSTIC EXAMS   08-28-16: chest x-ray: mild atelectasis left lung bases. No evidence of pneumonia or chf   LABS REVIEWED:   08-16-16: wound culture: mrsa 08-20-16; glucose 205; bun 24.3; creat 1.11; k+ 3.7; na++ 147 08-17-16: glucose 205; bun 16.0; creat 1.09; k+ 3.9; na++ 142 08-28-16: wbc 7.3; hgb 13.1; hct 43.5; mcv 83.2; plt 205; glucose 219; bun 24.1; creat 1.32; k+ 4.1; na++ 142; liver normal albumin 3.6  09-18-16: wbc 11.2; hgb 11.2; hct 36.1; mcv 83.5; plt 180; urine culture: pseudomonas aeruginosa: cipro  09-26-16; wbc 7.5; hgb 11.6; hct 39.7; mcv 82.9; plt 287; glucose 133; bun 13.8; creat 1.0;4; k+ 3.7; na++ 143  01-23-17: wbc 6.4; hgb 12.1; hct 39.8; mcv 82.0; plt 184; glucose 118; bun 17.5; creat 0.87; k+ 2.6; na++ 148; hgb  a1c 7.6; uric acid 6.7      Review of Systems  Unable to perform ROS: Dementia     Physical Exam  Constitutional: No distress.  Eyes: Conjunctivae are normal.  Neck: Neck supple. No JVD present. No thyromegaly present.  Cardiovascular: Normal rate, regular rhythm and intact distal pulses.   Respiratory: Effort normal and breath sounds normal. No respiratory distress. He has no wheezes.  GI: Soft. Bowel sounds are normal. He exhibits no distension. There is no tenderness.  Genitourinary:  Genitourinary Comments: Has foley   Musculoskeletal: He exhibits no edema.  Has left hemiparesis    Lymphadenopathy:    He has no cervical adenopathy.  Neurological: He is alert.  Skin: Skin is warm and dry. He is not diaphoretic.  Psychiatric: He has a normal mood and affect.    ASSESSMENT/ PLAN:  1. 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 Hypertension:130/80  will continue coreg 12.5 mg twice daily lisinopril 40 mg daily norvasc 5 mg daily   hydralazine  75 mg four times  daily   3. Diabetes: will continue novolog SSI: 60-150=3units; 151-400=5 units lantus 12 units nightly   4. Vascular dementia: will continue aricept 10 mg nightly and namdena xr 28 mg daily   5. Dyslipidemia: will continue zocor 20 mg daily   6. Urine retention: requires foley; will continue flomax 0.4 mg daily ditropan 5 mg twice daily and myrbetriq 50 mg daily   7. Adjustment disorder: will continue zoloft 25 mg daily   8. Lower extremity edema: will continue demadex 10 mg daily   9. Osteoarthritis bilateral knees: will continue voltaren gel 4 gm to both knees four times daily   10. Weight loss: weight 182 pounds; has completed remeron therapy was successful will monitor   11. Dyslipidemia: will continue zocor 20 mg daily   12. Urine retention due to neurogenic bladder: has long term foley is followed by urology    Ok Edwards NP [facility]  Contact [phone] Monday  through Friday 8am- 5pm  After hours call [phone]

## 2017-04-25 DIAGNOSIS — E119 Type 2 diabetes mellitus without complications: Secondary | ICD-10-CM | POA: Diagnosis not present

## 2017-04-25 LAB — HEMOGLOBIN A1C: Hemoglobin A1C: 7.4

## 2017-05-01 ENCOUNTER — Ambulatory Visit (INDEPENDENT_AMBULATORY_CARE_PROVIDER_SITE_OTHER): Payer: PPO | Admitting: Ophthalmology

## 2017-05-01 DIAGNOSIS — H43813 Vitreous degeneration, bilateral: Secondary | ICD-10-CM

## 2017-05-01 DIAGNOSIS — H35033 Hypertensive retinopathy, bilateral: Secondary | ICD-10-CM | POA: Diagnosis not present

## 2017-05-01 DIAGNOSIS — H59032 Cystoid macular edema following cataract surgery, left eye: Secondary | ICD-10-CM

## 2017-05-01 DIAGNOSIS — E11319 Type 2 diabetes mellitus with unspecified diabetic retinopathy without macular edema: Secondary | ICD-10-CM | POA: Diagnosis not present

## 2017-05-01 DIAGNOSIS — I10 Essential (primary) hypertension: Secondary | ICD-10-CM | POA: Diagnosis not present

## 2017-05-01 DIAGNOSIS — E113292 Type 2 diabetes mellitus with mild nonproliferative diabetic retinopathy without macular edema, left eye: Secondary | ICD-10-CM

## 2017-05-01 DIAGNOSIS — H35372 Puckering of macula, left eye: Secondary | ICD-10-CM

## 2017-05-01 DIAGNOSIS — H26493 Other secondary cataract, bilateral: Secondary | ICD-10-CM

## 2017-05-13 ENCOUNTER — Non-Acute Institutional Stay (SKILLED_NURSING_FACILITY): Payer: PPO | Admitting: Internal Medicine

## 2017-05-13 ENCOUNTER — Encounter: Payer: Self-pay | Admitting: Internal Medicine

## 2017-05-13 DIAGNOSIS — N472 Paraphimosis: Secondary | ICD-10-CM

## 2017-05-13 DIAGNOSIS — F0150 Vascular dementia without behavioral disturbance: Secondary | ICD-10-CM

## 2017-05-13 DIAGNOSIS — R2689 Other abnormalities of gait and mobility: Secondary | ICD-10-CM | POA: Diagnosis not present

## 2017-05-13 DIAGNOSIS — M6281 Muscle weakness (generalized): Secondary | ICD-10-CM | POA: Diagnosis not present

## 2017-05-13 DIAGNOSIS — B9689 Other specified bacterial agents as the cause of diseases classified elsewhere: Secondary | ICD-10-CM | POA: Diagnosis not present

## 2017-05-13 DIAGNOSIS — R339 Retention of urine, unspecified: Secondary | ICD-10-CM

## 2017-05-13 DIAGNOSIS — N390 Urinary tract infection, site not specified: Secondary | ICD-10-CM | POA: Diagnosis not present

## 2017-05-13 DIAGNOSIS — I639 Cerebral infarction, unspecified: Secondary | ICD-10-CM | POA: Diagnosis not present

## 2017-05-13 NOTE — Progress Notes (Signed)
Patient ID: [name], male   DOB: [birth year], 76 y.o.   MRN: [id number]    DATE:  05/13/2017  Location:    [facility] Room Number: 294 A Place of Service: SNF (31)   Extended Emergency Contact Information Primary Emergency Contact: Sedor,[name] Address: [address]          [place] [address] [place] Phone: [phone] Relation: Spouse Secondary Emergency Contact: Kilburg,[name]  [place] Relation: Daughter  Warden/ranger Does Patient Have a Catering manager?: Yes, Type of Advance Directive: Out of facility DNR (pink MOST or yellow form), Pre-existing out of facility DNR order (yellow form or pink MOST form): Pink MOST form placed in chart (order not valid for inpatient use), Does patient want to make changes to medical advance directive?: No - Patient declined  Chief Complaint  Patient presents with  . Acute Visit    Penile Edema    HPI:  76 yo male long term resident seen today for penile swelling x 1 day. Nursing reports pt with increased swelling and pain of penile tip that is new onset. Nursing attempted massage of area without success. He was seen at [facility] last week Urine cx resulted ESBL Kleb UTI. No d/c. He has a chronic indwelling foley cath 2/2 urinary retention. He is a poor historian due to dementia. Hx obtained from chart  Past Medical History:  Diagnosis Date  . Arthritis    "maybe"  . 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 CHF (congestive heart failure) ([place])   . Depression   . 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 Hyperlipemia   . Hypertension   . Sleep apnea    "my machine [place]; got to get me another one" (09/01/2014)  . Stroke [facility]) 2009   left thalamic infarct, residual LUE weakness  . TIA  (transient ischemic attack)    multiple in past, most recently in 2011  . Type II diabetes mellitus ([place])     Past Surgical History:  Procedure Laterality Date  . CARDIAC CATHETERIZATION  07/2010  . CRANIOTOMY  05/2008   for clipping of an incidental asymptomatic unruptured/notes 02/13/2011  . KNEE ARTHROSCOPY Left   . 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 SHOULDER ARTHROSCOPY W/ ROTATOR CUFF REPAIR Right     Patient Care Team: Gildardo Cranker, DO as PCP - General (Internal Medicine) Webb Laws, [place] as Referring Physician (Optometry) MacDiarmid, Nicki Reaper, MD as Consulting Physician (Urology) Hayden Pedro, MD as Consulting Physician (Ophthalmology) Borum, Jaci Standard, MD as Referring Physician (Internal Medicine) Gerlene Fee, NP as Nurse Practitioner ([place]) [place], [place] ([place])  Social History   Social History  . Marital status: Married    Spouse name: [name]  . Number of children: 3  . Years of education: HS   Occupational History  . disabled    Social History Main Topics  . Smoking status: Former Smoker    Packs/day: 2.00    Years: 20.00    Types: Cigarettes    Quit date: 10/16/1983  . Smokeless tobacco: Never Used  . Alcohol use No  . Drug use: No  . Sexual activity: No   Other Topics Concern  . Not on file   Social History Narrative  Pt lives at home with spouse.   Caffeine Use: 1 cup daily     reports that he quit smoking about 33 years ago. His smoking use included Cigarettes. He has a 40.00 pack-year smoking history. He has never used smokeless tobacco. He reports that he does not drink alcohol or use drugs.  Family History  Problem Relation Age of Onset  . Stroke Mother   . Pneumonia Father   . Diabetes Sister   . Diabetes Brother   . Diabetes Paternal Aunt   . Colon cancer Neg Hx    Family Status  Relation Status  . Mother Deceased at age 30  . Father Deceased at age 40  .  Sister (Not Specified)  . Brother (Not Specified)  . Ethlyn Daniels (Not Specified)  . Neg Hx (Not Specified)    Immunization History  Administered Date(s) Administered  . Influenza Split 06/25/2012  . Influenza Whole 07/18/2007, 08/18/2008, 08/09/2009, 06/14/2010, 06/20/2011  . Influenza,inj,Quad PF,36+ Mos 06/24/2013, 06/08/2014  . Influenza-Unspecified 08/20/2016  . PPD Test 04/25/2016, 06/29/2016, 07/06/2016  . Pneumococcal Conjugate-13 01/06/2015  . Pneumococcal Polysaccharide-23 05/30/2008  . Tdap 06/20/2011    Allergies  Allergen Reactions  . Pioglitazone Other (See Comments)    Edema   . Rosiglitazone Maleate Other (See Comments)    edema    Medications: Patient's Medications  New Prescriptions   No medications on file  Previous Medications   AMLODIPINE (NORVASC) 5 MG TABLET    Take 5 mg by mouth daily.   CARVEDILOL (COREG) 12.5 MG TABLET    Take 1 tablet (12.5 mg total) by mouth 2 (two) times daily.   CLOPIDOGREL (PLAVIX) 75 MG TABLET    Take 1 tablet by mouth at bedtime. On hold until 09/01/2016   DICLOFENAC SODIUM (VOLTAREN) 1 % GEL    Apply 4 g topically 4 (four) times daily.   DONEPEZIL (ARICEPT) 10 MG TABLET    Take 1 tablet (10 mg total) by mouth at bedtime.   HYDRALAZINE (APRESOLINE) 50 MG TABLET    Take 75 mg by mouth 4 (four) times daily.    INSULIN ASPART (NOVOLOG) 100 UNIT/ML INJECTION    Inject 0-5 Units into the skin 3 (three) times daily before meals. Inject as per sliding scale: 0-59=0 call MD if less than 60, 60-150=3 units 301-400=5 units call MD if >400    INSULIN GLARGINE (LANTUS) 100 UNIT/ML INJECTION    Inject 12 Units into the skin daily.    LISINOPRIL (PRINIVIL,ZESTRIL) 20 MG TABLET    Take 40 mg by mouth daily. Give 2 tablets by mouth daily    MEMANTINE (NAMENDA XR) 28 MG CP24 24 HR CAPSULE    Take 1 capsule (28 mg total) by mouth daily.   MIRABEGRON ER (MYRBETRIQ) 50 MG TB24 TABLET    Take 50 mg by mouth daily.   NITROGLYCERIN (NITROSTAT) 0.4 MG  SL TABLET    Place 0.4 mg under the tongue every 5 (five) minutes as needed for chest pain.   OXYBUTYNIN (DITROPAN) 5 MG TABLET    Take 5 mg by mouth 2 (two) times daily.   PHENAZOPYRIDINE (PYRIDIUM) 100 MG TABLET    Take 100 mg by mouth 3 (three) times daily as needed for pain.   POLYETHYL GLYCOL-PROPYL GLYCOL (SYSTANE) 0.4-0.3 % SOLN    Apply 1 drop to eye 2 (two) times daily. Both eyes   PREDNISOLONE ACETATE (PRED FORTE) 1 % OPHTHALMIC SUSPENSION    Place 1 drop into the left eye  4 (four) times daily.   SACCHAROMYCES BOULARDII (FLORASTOR) 250 MG CAPSULE    Take 250 mg by mouth 2 (two) times daily. Every 13 day   SERTRALINE (ZOLOFT) 25 MG TABLET    Take 1 tablet (25 mg total) by mouth daily.   SIMVASTATIN (ZOCOR) 20 MG TABLET    Take 1 tablet (20 mg total) by mouth at bedtime.   TAMSULOSIN (FLOMAX) 0.4 MG CAPS CAPSULE    Take 0.4 mg by mouth at bedtime.   TORSEMIDE (DEMADEX) 10 MG TABLET    Take 10 mg by mouth daily.  Modified Medications   No medications on file  Discontinued Medications   HYDROXYPROPYL METHYLCELLULOSE / HYPROMELLOSE (ISOPTO TEARS / GONIOVISC) 2.5 % OPHTHALMIC SOLUTION    Place 2 drops into both eyes 2 (two) times daily.    Review of Systems  Unable to perform ROS: Dementia    Vitals:   05/13/17 1441  BP: (!) 158/56  Pulse: 78  Resp: 18  Temp: (!) 97.4 F (36.3 C)  TempSrc: Oral  SpO2: 95%  Weight: 196 lb 8 oz (89.1 kg)  Height: 5\' 7"  (1.702 m)   Body mass index is 30.78 kg/m.  Physical Exam  Constitutional: He appears well-developed and well-nourished.  Genitourinary:  Genitourinary Comments: Foley cath intact and DTG cloudy urine. Large paraphimosis noted. No d/c or ulceration.   Musculoskeletal: He exhibits edema.  Neurological: He is alert.  Skin: Skin is warm and dry.  Psychiatric: He has a normal mood and affect. His behavior is normal.     Labs reviewed: Nursing Home on 03/25/2017  Component Date Value Ref Range Status  . Hemoglobin  01/23/2017 12.1* 13.5 - 17.5 Final  . HCT 01/23/2017 40* 41 - 53 Final  . Platelets 01/23/2017 184  150 - 399 Final  . WBC 01/23/2017 6.4   Final  . Glucose 01/23/2017 118   Final  . BUN 01/23/2017 18  4 - 21 Final  . Creatinine 01/23/2017 0.9  0.6 - 1.3 Final  . Potassium 01/23/2017 3.6  3.4 - 5.3 Final  . Sodium 01/23/2017 148* 137 - 147 Final  . Hemoglobin A1C 01/23/2017 7.6   Final  Abstract on 02/26/2017  Component Date Value Ref Range Status  . Hemoglobin 02/25/2017 11.8* 13.5 - 17.5 g/dL Final  . HCT 02/25/2017 39* 41 - 53 % Final  . Neutrophils Absolute 02/25/2017 4  /L Final  . Platelets 02/25/2017 181  150 - 399 K/L Final  . WBC 02/25/2017 7.0  10^3/mL Final  . Glucose 02/25/2017 160  mg/dL Final  . BUN 02/25/2017 18  4 - 21 mg/dL Final  . Creatinine 02/25/2017 0.9  0.6 - 1.3 mg/dL Final  . Potassium 02/25/2017 3.8  3.4 - 5.3 mmol/L Final  . Sodium 02/25/2017 145  137 - 147 mmol/L Final  . Alkaline Phosphatase 02/25/2017 74  25 - 125 U/L Final  . ALT 02/25/2017 7* 10 - 40 U/L Final  . AST 02/25/2017 7* 14 - 40 U/L Final  . Bilirubin, Total 02/25/2017 0.5  mg/dL Final    No results found.   Assessment/Plan   ICD-10-CM   1. Paraphimosis - NEW N47.2   2. Urinary tract infection due to ESBL Klebsiella N39.0    B96.89   3. Urinary retention R33.9    s/p foley cath  4. Vascular dementia without behavioral disturbance F01.50     Refer to urology urgently for paraphimosis  Contact isolation for ESBL Kleb UTI  Foley cath  care as indicated  Change Cipro to 500mg  BID x 10 days  May hold aricept while on cipro due to drug-drug interaction  Will follow  TIME SPENT: 30 MINUTES WITH >50% OF TIME [place] S. Perlie Gold  [facility] [address] [place],  [address] [phone] Cell (Monday-Friday 8 AM - 5 PM) [phone] After 5 PM and follow prompts

## 2017-05-15 DIAGNOSIS — M6281 Muscle weakness (generalized): Secondary | ICD-10-CM | POA: Diagnosis not present

## 2017-05-15 DIAGNOSIS — R262 Difficulty in walking, not elsewhere classified: Secondary | ICD-10-CM | POA: Diagnosis not present

## 2017-05-15 DIAGNOSIS — R2689 Other abnormalities of gait and mobility: Secondary | ICD-10-CM | POA: Diagnosis not present

## 2017-05-15 DIAGNOSIS — I639 Cerebral infarction, unspecified: Secondary | ICD-10-CM | POA: Diagnosis not present

## 2017-05-22 ENCOUNTER — Encounter: Payer: Self-pay | Admitting: Adult Health

## 2017-05-22 ENCOUNTER — Non-Acute Institutional Stay (SKILLED_NURSING_FACILITY): Payer: PPO | Admitting: Adult Health

## 2017-05-22 DIAGNOSIS — E785 Hyperlipidemia, unspecified: Secondary | ICD-10-CM | POA: Diagnosis not present

## 2017-05-22 DIAGNOSIS — I6350 Cerebral infarction due to unspecified occlusion or stenosis of unspecified cerebral artery: Secondary | ICD-10-CM

## 2017-05-22 DIAGNOSIS — E1149 Type 2 diabetes mellitus with other diabetic neurological complication: Secondary | ICD-10-CM | POA: Diagnosis not present

## 2017-05-22 DIAGNOSIS — E1169 Type 2 diabetes mellitus with other specified complication: Secondary | ICD-10-CM

## 2017-05-22 DIAGNOSIS — I10 Essential (primary) hypertension: Secondary | ICD-10-CM | POA: Diagnosis not present

## 2017-05-22 DIAGNOSIS — F0150 Vascular dementia without behavioral disturbance: Secondary | ICD-10-CM

## 2017-05-22 NOTE — Progress Notes (Signed)
Location:   [place] Room Number: 446 A Place of Service:  SNF (31)   CODE STATUS: DNR  Allergies  Allergen Reactions  . Pioglitazone Other (See Comments)    Edema   . Rosiglitazone Maleate Other (See Comments)    edema    Chief Complaint  Patient presents with  . Medical Management of Chronic Issues    1 month follow up    HPI:  He is a 76 year old long term resident of this facility being seen for the management of his chronic illnesses: cva; hypertension diabetes; dyslipidemia; and dementia; . He did have a fall without injury this past month. He is unable to fully participate in the hpi or ros. Nursing staff report that there is no change in appetite or activity level. He is due to have a suprpubic catheter placed. There are no nursing concerns at this time.    Past Medical History:  Diagnosis Date  . Arthritis    "maybe"  . 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 CHF (congestive heart failure) ([place])   . Depression   . 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 Hyperlipemia   . Hypertension   . Sleep apnea    "my machine [place]; got to get me another one" (09/01/2014)  . Stroke [facility]) 2009   left thalamic infarct, residual LUE weakness  . TIA (transient ischemic attack)    multiple in past, most recently in 2011  . Type II diabetes mellitus ([place])     Past Surgical History:  Procedure Laterality Date  . CARDIAC CATHETERIZATION  07/2010  . CRANIOTOMY  05/2008   for clipping of an incidental asymptomatic unruptured/notes 02/13/2011  . KNEE ARTHROSCOPY Left   . 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 SHOULDER ARTHROSCOPY W/ ROTATOR CUFF REPAIR Right     Social History   Social History  . Marital status: Married    Spouse name: [name]  . Number of children: 3  . Years of  education: HS   Occupational History  . disabled    Social History Main Topics  . Smoking status: Former Smoker    Packs/day: 2.00    Years: 20.00    Types: Cigarettes    Quit date: 10/16/1983  . Smokeless tobacco: Never Used  . Alcohol use No  . Drug use: No  . Sexual activity: No   Other Topics Concern  . Not on file   Social History Narrative   Pt lives at home with spouse.   Caffeine Use: 1 cup daily   Family History  Problem Relation Age of Onset  . Stroke Mother   . Pneumonia Father   . Diabetes Sister   . Diabetes Brother   . Diabetes Paternal Aunt   . Colon cancer Neg Hx       VITAL SIGNS BP (!) 162/60   Pulse 62   Temp (!) 97.2 F (36.2 C)   Resp 18   Ht 5\' 7"  (1.702 m)   Wt 196 lb 8 oz (89.1 kg)   SpO2 95%   BMI 30.78 kg/m   Repeat blood pressure 140/90  Patient's Medications  New Prescriptions   No medications on file  Previous Medications   AMLODIPINE (NORVASC) 5 MG TABLET    Take 5 mg by  mouth every morning.    CARVEDILOL (COREG) 12.5 MG TABLET    Take 1 tablet (12.5 mg total) by mouth 2 (two) times daily.   CIPROFLOXACIN (CIPRO) 500 MG TABLET    Take 500 mg by mouth 2 (two) times daily.   CLOPIDOGREL (PLAVIX) 75 MG TABLET    Take 1 tablet by mouth at bedtime. On hold until 09/01/2016   DICLOFENAC SODIUM (VOLTAREN) 1 % GEL    Apply 4 g topically 4 (four) times daily. Apply to Bilateral Knees   DONEPEZIL (ARICEPT) 10 MG TABLET    Take 1 tablet (10 mg total) by mouth at bedtime.   HYDRALAZINE (APRESOLINE) 50 MG TABLET    Take 75 mg by mouth 4 (four) times daily.    INSULIN ASPART (NOVOLOG) 100 UNIT/ML INJECTION    Inject 0-5 Units into the skin 3 (three) times daily before meals. Inject as per sliding scale: 0-59=0 call MD if less than 60.  60-150=0; 151-300 = 3 units 301-400=5 units call MD if >400   INSULIN GLARGINE (LANTUS) 100 UNIT/ML INJECTION    Inject 12 Units into the skin daily.    LISINOPRIL (PRINIVIL,ZESTRIL) 20 MG TABLET    Take 40 mg  by mouth daily. Give 2 tablets by mouth daily    MEMANTINE (NAMENDA XR) 28 MG CP24 24 HR CAPSULE    Take 1 capsule (28 mg total) by mouth daily.   MIRABEGRON ER (MYRBETRIQ) 50 MG TB24 TABLET    Take 50 mg by mouth daily.   NITROGLYCERIN (NITROSTAT) 0.4 MG SL TABLET    Place 0.4 mg under the tongue every 5 (five) minutes as needed for chest pain.   OXYBUTYNIN (DITROPAN) 5 MG TABLET    Take 5 mg by mouth 2 (two) times daily.   PHENAZOPYRIDINE (PYRIDIUM) 100 MG TABLET    Take 100 mg by mouth 3 (three) times daily as needed for pain.   POLYETHYL GLYCOL-PROPYL GLYCOL (SYSTANE) 0.4-0.3 % SOLN    Apply 1 drop to eye 2 (two) times daily. Both eyes   POLYETHYLENE GLYCOL (MIRALAX / GLYCOLAX) PACKET    Take 17 g by mouth daily.   PREDNISOLONE ACETATE (PRED FORTE) 1 % OPHTHALMIC SUSPENSION    Place 1 drop into the left eye 4 (four) times daily.   SACCHAROMYCES BOULARDII (FLORASTOR) 250 MG CAPSULE    Take 250 mg by mouth 2 (two) times daily. Every 13 day   SENNA (SENOKOT) 8.6 MG TABLET    Take 1 tablet by mouth every 12 (twelve) hours as needed for constipation.   SERTRALINE (ZOLOFT) 25 MG TABLET    Take 1 tablet (25 mg total) by mouth daily.   SIMVASTATIN (ZOCOR) 20 MG TABLET    Take 1 tablet (20 mg total) by mouth at bedtime.   TAMSULOSIN (FLOMAX) 0.4 MG CAPS CAPSULE    Take 0.4 mg by mouth at bedtime.   TORSEMIDE (DEMADEX) 10 MG TABLET    Take 10 mg by mouth daily.  Modified Medications   No medications on file  Discontinued Medications   No medications on file     [place]  08-28-16: chest x-ray: mild atelectasis left lung bases. No evidence of pneumonia or chf  NO NEW EXAMS    LABS REVIEWED: PREVIOUS    08-16-16: wound culture: mrsa 08-20-16; glucose 205; bun 24.3; creat 1.11; k+ 3.7; na++ 147 08-17-16: glucose 205; bun 16.0; creat 1.09; k+ 3.9; na++ 142 08-28-16: wbc 7.3; hgb 13.1; hct 43.5; mcv  83.2; plt 205; glucose 219; bun 24.1; creat 1.32; k+ 4.1; na++  142; liver normal albumin 3.6  09-18-16: wbc 11.2; hgb 11.2; hct 36.1; mcv 83.5; plt 180; urine culture: pseudomonas aeruginosa: cipro  09-26-16; wbc 7.5; hgb 11.6; hct 39.7; mcv 82.9; plt 287; glucose 133; bun 13.8; creat 1.0;4; k+ 3.7; na++ 143  01-23-17: wbc 6.4; hgb 12.1; hct 39.8; mcv 82.0; plt 184; glucose 118; bun 17.5; creat 0.87; k+ 2.6; na++ 148; hgb a1c 7.6; uric acid 6.7   TODAY: 04-25-17: hgb a1c 7.4  Review of Systems  Unable to perform ROS: Dementia (unable to fully answer questions. )   Physical Exam  Constitutional: No distress.  Eyes: Conjunctivae are normal.  Neck: Neck supple. No JVD present. No thyromegaly present.  Cardiovascular: Normal rate, regular rhythm and intact distal pulses.   Respiratory: Effort normal and breath sounds normal. No respiratory distress. He has no wheezes.  GI: Soft. Bowel sounds are normal. He exhibits no distension. There is no tenderness.  Genitourinary:  Genitourinary Comments: foley  Musculoskeletal: He exhibits no edema.  Left hemiparesis  Lymphadenopathy:    He has no cervical adenopathy.  Neurological: He is alert.  Skin: Skin is warm and dry. He is not diaphoretic.  Psychiatric: He has a normal mood and affect.    ASSESSMENT/ PLAN:  TODAY:   1. 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 Hypertension: stable 140/90 will continue coreg 12.5 mg twice daily lisinopril 40 mg daily norvasc 5 mg daily   hydralazine  75 mg four times  daily   3. Diabetes: stable hgb a1c 7.4 will continue novolog SSI: 60-150=3units; 151-400=5 units lantus 12 units nightly   4. Vascular dementia: no significant change in status will continue aricept 10 mg nightly and namdena xr 28 mg daily   Weight is 196 pounds ( he did respond to remeron)  5. Dyslipidemia:stable  will continue zocor 20 mg daily  PREVIOUS   6. Urine retention: stable requires foley; will continue flomax 0.4 mg daily ditropan 5 mg twice  daily and myrbetriq 50 mg daily   7. Adjustment disorder: will continue zoloft 25 mg daily   8. Lower extremity edema: will continue demadex 10 mg daily   9. Osteoarthritis bilateral knees: stable will continue voltaren gel 4 gm to both knees four times daily   10. Urine retention due to neurogenic bladder: stable  has long term foley is followed by urology    MD is aware of resident's narcotic use and is in agreement with current plan of care. We will attempt to wean resident as apropriate     Ok Edwards NP [facility]  Contact [phone] Monday through Friday 8am- 5pm  After hours call [phone]

## 2017-05-24 DIAGNOSIS — N390 Urinary tract infection, site not specified: Secondary | ICD-10-CM | POA: Diagnosis not present

## 2017-05-24 LAB — MICROALBUMIN, URINE: MICROALB UR: 34.1

## 2017-05-31 ENCOUNTER — Encounter: Payer: Self-pay | Admitting: Adult Health

## 2017-05-31 NOTE — Progress Notes (Signed)
Location:   [place] Room Number: 191 A Place of Service:  SNF (31)   CODE STATUS: DNR  Allergies  Allergen Reactions  . Pioglitazone Other (See Comments)    Edema   . Rosiglitazone Maleate Other (See Comments)    edema    Chief Complaint  Patient presents with  . Acute Visit    Follow up s/p catheter    HPI:  He has had a s/p foley placed yesterday. He  Is not complaining of pain that this time. He has both foley and s/p.   Past Medical History:  Diagnosis Date  . Arthritis    "maybe"  . 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 CHF (congestive heart failure) ([place])   . Depression   . 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 Hyperlipemia   . Hypertension   . Sleep apnea    "my machine [place]; got to get me another one" (09/01/2014)  . Stroke [facility]) 2009   left thalamic infarct, residual LUE weakness  . TIA (transient ischemic attack)    multiple in past, most recently in 2011  . Type II diabetes mellitus ([place])     Past Surgical History:  Procedure Laterality Date  . CARDIAC CATHETERIZATION  07/2010  . CRANIOTOMY  05/2008   for clipping of an incidental asymptomatic unruptured/notes 02/13/2011  . KNEE ARTHROSCOPY Left   . 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 SHOULDER ARTHROSCOPY W/ ROTATOR CUFF REPAIR Right     Social History   Social History  . Marital status: Married    Spouse name: [name]  . Number of children: 3  . Years of education: HS   Occupational History  . disabled    Social History Main Topics  . Smoking status: Former Smoker    Packs/day: 2.00    Years: 20.00    Types: Cigarettes    Quit date: 10/16/1983  . Smokeless tobacco: Never Used  . Alcohol use No  . Drug use: No  . Sexual activity: No   Other Topics Concern  . Not on file   Social History  Narrative   Pt lives at home with spouse.   Caffeine Use: 1 cup daily   Family History  Problem Relation Age of Onset  . Stroke Mother   . Pneumonia Father   . Diabetes Sister   . Diabetes Brother   . Diabetes Paternal Aunt   . Colon cancer Neg Hx       VITAL SIGNS BP 110/70   Pulse 64   Temp 98 F (36.7 C)   Resp 18   Ht 5\' 7"  (1.702 m)   Wt 198 lb (89.8 kg)   SpO2 97%   BMI 31.01 kg/m   Patient's Medications  New Prescriptions   No medications on file  Previous Medications   AMLODIPINE (NORVASC) 5 MG TABLET    Take 5 mg by mouth every morning.    CARVEDILOL (COREG) 12.5 MG TABLET    Take 1 tablet (12.5 mg total) by mouth 2 (two) times daily.   DICLOFENAC SODIUM (VOLTAREN) 1 % GEL    Apply 4 g topically 4 (four) times daily. Apply to Bilateral Knees   DOCUSATE SODIUM PO    Take 1 tablet by mouth 2 (two) times daily. Until 06/02/17  for post op 06/03/17 Give 1 tablet by mouth every 12 hours as needed   DONEPEZIL (ARICEPT) 10 MG TABLET    Take 1 tablet (10 mg total) by mouth at bedtime.   HYDRALAZINE (APRESOLINE) 50 MG TABLET    Take 75 mg by mouth 4 (four) times daily.    INSULIN ASPART (NOVOLOG) 100 UNIT/ML INJECTION    Inject 0-5 Units into the skin 3 (three) times daily before meals. Inject as per sliding scale: 0-59=0 call MD if less than 60.  60-150=0; 151-300 = 3 units 301-400=5 units call MD if >400   INSULIN GLARGINE (LANTUS) 100 UNIT/ML INJECTION    Inject 12 Units into the skin daily.    LISINOPRIL (PRINIVIL,ZESTRIL) 20 MG TABLET    Take 40 mg by mouth daily. Give 2 tablets by mouth daily    MEMANTINE (NAMENDA XR) 28 MG CP24 24 HR CAPSULE    Take 1 capsule (28 mg total) by mouth daily.   MIRABEGRON ER (MYRBETRIQ) 50 MG TB24 TABLET    Take 50 mg by mouth daily.   NITROGLYCERIN (NITROSTAT) 0.4 MG SL TABLET    Place 0.4 mg under the tongue every 5 (five) minutes as needed for chest pain.   OXYBUTYNIN (DITROPAN) 5 MG TABLET    Take 5 mg by mouth 2 (two) times daily.    PHENAZOPYRIDINE (PYRIDIUM) 100 MG TABLET    Take 100 mg by mouth 3 (three) times daily as needed for pain.   POLYETHYL GLYCOL-PROPYL GLYCOL (SYSTANE) 0.4-0.3 % SOLN    Apply 1 drop to eye 2 (two) times daily. Both eyes   POLYETHYLENE GLYCOL (MIRALAX / GLYCOLAX) PACKET    Take 17 g by mouth daily.   PREDNISOLONE ACETATE (PRED FORTE) 1 % OPHTHALMIC SUSPENSION    Place 1 drop into the left eye 4 (four) times daily.   SACCHAROMYCES BOULARDII (FLORASTOR) 250 MG CAPSULE    Take 250 mg by mouth 2 (two) times daily. Every 13 day   SENNA (SENOKOT) 8.6 MG TABLET    Take 1 tablet by mouth every 12 (twelve) hours as needed for constipation.   SERTRALINE (ZOLOFT) 25 MG TABLET    Take 1 tablet (25 mg total) by mouth daily.   SIMVASTATIN (ZOCOR) 20 MG TABLET    Take 1 tablet (20 mg total) by mouth at bedtime.   TAMSULOSIN (FLOMAX) 0.4 MG CAPS CAPSULE    Take 0.4 mg by mouth at bedtime.   TORSEMIDE (DEMADEX) 10 MG TABLET    Take 10 mg by mouth daily.   TRAMADOL (ULTRAM) 50 MG TABLET    Take 50 mg by mouth every 6 (six) hours as needed for moderate pain or severe pain.  Modified Medications   No medications on file  Discontinued Medications   CLOPIDOGREL (PLAVIX) 75 MG TABLET    Take 1 tablet by mouth at bedtime. On hold until 09/01/2016     SIGNIFICANT DIAGNOSTIC EXAMS       ASSESSMENT/ PLAN:    MD is aware of resident's narcotic use and is in agreement with current plan of care. We will attempt to wean resident as apropriate   Ok Edwards NP [facility] Adult Medicine  Contact [phone] Monday through Friday 8am- 5pm  After hours call [phone]    This encounter was created in error - please disregard.

## 2017-06-05 DIAGNOSIS — E113293 Type 2 diabetes mellitus with mild nonproliferative diabetic retinopathy without macular edema, bilateral: Secondary | ICD-10-CM | POA: Diagnosis not present

## 2017-06-05 DIAGNOSIS — Z961 Presence of intraocular lens: Secondary | ICD-10-CM | POA: Diagnosis not present

## 2017-06-05 DIAGNOSIS — Z794 Long term (current) use of insulin: Secondary | ICD-10-CM | POA: Diagnosis not present

## 2017-06-12 ENCOUNTER — Encounter (INDEPENDENT_AMBULATORY_CARE_PROVIDER_SITE_OTHER): Payer: PPO | Admitting: Ophthalmology

## 2017-06-12 DIAGNOSIS — H35372 Puckering of macula, left eye: Secondary | ICD-10-CM

## 2017-06-12 DIAGNOSIS — H59032 Cystoid macular edema following cataract surgery, left eye: Secondary | ICD-10-CM | POA: Diagnosis not present

## 2017-06-12 DIAGNOSIS — E11319 Type 2 diabetes mellitus with unspecified diabetic retinopathy without macular edema: Secondary | ICD-10-CM | POA: Diagnosis not present

## 2017-06-12 DIAGNOSIS — H43813 Vitreous degeneration, bilateral: Secondary | ICD-10-CM

## 2017-06-12 DIAGNOSIS — I10 Essential (primary) hypertension: Secondary | ICD-10-CM

## 2017-06-12 DIAGNOSIS — E113393 Type 2 diabetes mellitus with moderate nonproliferative diabetic retinopathy without macular edema, bilateral: Secondary | ICD-10-CM

## 2017-06-12 DIAGNOSIS — H35033 Hypertensive retinopathy, bilateral: Secondary | ICD-10-CM

## 2017-06-17 DIAGNOSIS — I639 Cerebral infarction, unspecified: Secondary | ICD-10-CM | POA: Diagnosis not present

## 2017-06-17 DIAGNOSIS — M6281 Muscle weakness (generalized): Secondary | ICD-10-CM | POA: Diagnosis not present

## 2017-06-17 DIAGNOSIS — R262 Difficulty in walking, not elsewhere classified: Secondary | ICD-10-CM | POA: Diagnosis not present

## 2017-06-20 ENCOUNTER — Non-Acute Institutional Stay (SKILLED_NURSING_FACILITY): Payer: PPO | Admitting: Adult Health

## 2017-06-20 ENCOUNTER — Encounter: Payer: Self-pay | Admitting: Adult Health

## 2017-06-20 DIAGNOSIS — T83010A Breakdown (mechanical) of cystostomy catheter, initial encounter: Secondary | ICD-10-CM

## 2017-06-20 DIAGNOSIS — N390 Urinary tract infection, site not specified: Secondary | ICD-10-CM | POA: Diagnosis not present

## 2017-06-20 DIAGNOSIS — I10 Essential (primary) hypertension: Secondary | ICD-10-CM | POA: Diagnosis not present

## 2017-06-20 DIAGNOSIS — E119 Type 2 diabetes mellitus without complications: Secondary | ICD-10-CM | POA: Diagnosis not present

## 2017-06-20 DIAGNOSIS — Z79899 Other long term (current) drug therapy: Secondary | ICD-10-CM | POA: Diagnosis not present

## 2017-06-20 DIAGNOSIS — D649 Anemia, unspecified: Secondary | ICD-10-CM | POA: Diagnosis not present

## 2017-06-20 LAB — CBC AND DIFFERENTIAL
HCT: 36 — AB (ref 41–53)
Hemoglobin: 11.1 — AB (ref 13.5–17.5)
Neutrophils Absolute: 8
PLATELETS: 274 (ref 150–399)
WBC: 10.9

## 2017-06-20 LAB — BASIC METABOLIC PANEL
BUN: 19 (ref 4–21)
CREATININE: 0.9 (ref 0.6–1.3)
GLUCOSE: 110
POTASSIUM: 3.8 (ref 3.4–5.3)
Sodium: 141 (ref 137–147)

## 2017-06-20 NOTE — Progress Notes (Signed)
Location:   [place] Room Number: 295 A Place of Service:  SNF (31)   CODE STATUS: DNR  Allergies  Allergen Reactions  . Pioglitazone Other (See Comments)    Edema   . Rosiglitazone Maleate Other (See Comments)    edema    Chief Complaint  Patient presents with  . Acute Visit    Leaking from suprapubic catheter    HPI:  Staff reports that for the past several days that he has had increasing amounts of urinary incontinence. He denies any pain; does not know if he is urinating through is urethra. The urine is very foul; he has supra pubic pain present. There are no signs of infection a the stoma site.  There are no reports of fever present no change in appetite.     Past Medical History:  Diagnosis Date  . Arthritis    "maybe"  . 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 CHF (congestive heart failure) ([place])   . Depression   . 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 Hyperlipemia   . Hypertension   . Sleep apnea    "my machine [place]; got to get me another one" (09/01/2014)  . Stroke [facility]) 2009   left thalamic infarct, residual LUE weakness  . TIA (transient ischemic attack)    multiple in past, most recently in 2011  . Type II diabetes mellitus ([place])     Past Surgical History:  Procedure Laterality Date  . CARDIAC CATHETERIZATION  07/2010  . CRANIOTOMY  05/2008   for clipping of an incidental asymptomatic unruptured/notes 02/13/2011  . KNEE ARTHROSCOPY Left   . 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 SHOULDER ARTHROSCOPY W/ ROTATOR CUFF REPAIR Right     Social History   Social History  . Marital status: Married    Spouse name: [name]  . Number of children: 3  . Years of education: HS   Occupational History  . disabled    Social History Main Topics  . Smoking status: Former  Smoker    Packs/day: 2.00    Years: 20.00    Types: Cigarettes    Quit date: 10/16/1983  . Smokeless tobacco: Never Used  . Alcohol use No  . Drug use: No  . Sexual activity: No   Other Topics Concern  . Not on file   Social History Narrative   Pt lives at home with spouse.   Caffeine Use: 1 cup daily   Family History  Problem Relation Age of Onset  . Stroke Mother   . Pneumonia Father   . Diabetes Sister   . Diabetes Brother   . Diabetes Paternal Aunt   . Colon cancer Neg Hx       VITAL SIGNS BP 140/78   Pulse 60   Ht 5\' 7"  (1.702 m)   Wt 191 lb 3.2 oz (86.7 kg)   BMI 29.95 kg/m    Patient's Medications  New Prescriptions   No medications on file  Previous Medications   AMLODIPINE (NORVASC) 5 MG TABLET    Take 5 mg by mouth every morning.    CARVEDILOL (COREG) 12.5 MG TABLET    Take 1 tablet (12.5 mg total) by mouth 2 (two) times daily.   CLOPIDOGREL (PLAVIX) 75 MG TABLET    Take 75  mg by mouth at bedtime.   DICLOFENAC SODIUM (VOLTAREN) 1 % GEL    Apply 4 g topically 4 (four) times daily. Apply to Bilateral Knees   DOCUSATE SODIUM PO    Take 1 capsule by mouth every 12 (twelve) hours as needed.   DONEPEZIL (ARICEPT) 10 MG TABLET    Take 1 tablet (10 mg total) by mouth at bedtime.   HYDRALAZINE (APRESOLINE) 50 MG TABLET    Take 75 mg by mouth 4 (four) times daily.    INSULIN ASPART (NOVOLOG) 100 UNIT/ML INJECTION    Inject 0-5 Units into the skin 3 (three) times daily before meals. Inject as per sliding scale: 0-59=0 call MD if less than 60.  60-150=0; 151-300 = 3 units 301-400=5 units call MD if >400   INSULIN GLARGINE (LANTUS) 100 UNIT/ML INJECTION    Inject 12 Units into the skin daily.    LISINOPRIL (PRINIVIL,ZESTRIL) 20 MG TABLET    Take 40 mg by mouth daily. Give 2 tablets by mouth daily    MEMANTINE (NAMENDA XR) 28 MG CP24 24 HR CAPSULE    Take 1 capsule (28 mg total) by mouth daily.   MIRABEGRON ER (MYRBETRIQ) 50 MG TB24 TABLET    Take 50 mg by mouth daily.     NITROGLYCERIN (NITROSTAT) 0.4 MG SL TABLET    Place 0.4 mg under the tongue every 5 (five) minutes as needed for chest pain.   PHENAZOPYRIDINE (PYRIDIUM) 100 MG TABLET    Take 100 mg by mouth 3 (three) times daily as needed for pain.   POLYETHYL GLYCOL-PROPYL GLYCOL (SYSTANE) 0.4-0.3 % SOLN    Apply 1 drop to eye 2 (two) times daily. Both eyes   POLYETHYLENE GLYCOL (MIRALAX / GLYCOLAX) PACKET    Take 17 g by mouth daily.   PREDNISOLONE ACETATE (PRED FORTE) 1 % OPHTHALMIC SUSPENSION    Place 1 drop into the left eye 4 (four) times daily.   SACCHAROMYCES BOULARDII (FLORASTOR) 250 MG CAPSULE    Take 250 mg by mouth 2 (two) times daily. Every 13 day   SENNA (SENOKOT) 8.6 MG TABLET    Take 1 tablet by mouth every 12 (twelve) hours as needed for constipation.   SERTRALINE (ZOLOFT) 25 MG TABLET    Take 1 tablet (25 mg total) by mouth daily.   SIMVASTATIN (ZOCOR) 20 MG TABLET    Take 1 tablet (20 mg total) by mouth at bedtime.   TAMSULOSIN (FLOMAX) 0.4 MG CAPS CAPSULE    Take 0.4 mg by mouth at bedtime.   TORSEMIDE (DEMADEX) 10 MG TABLET    Take 10 mg by mouth daily.   TRAMADOL (ULTRAM) 50 MG TABLET    Take 50 mg by mouth every 6 (six) hours as needed for moderate pain or severe pain.  Modified Medications   No medications on file  Discontinued Medications   OXYBUTYNIN (DITROPAN) 5 MG TABLET    Take 5 mg by mouth 2 (two) times daily.     SIGNIFICANT DIAGNOSTIC EXAMS   PREVIOUS  08-28-16: chest x-ray: mild atelectasis left lung bases. No evidence of pneumonia or chf  NO NEW EXAMS    LABS REVIEWED: PREVIOUS    08-16-16: wound culture: mrsa 08-20-16; glucose 205; bun 24.3; creat 1.11; k+ 3.7; na++ 147 08-17-16: glucose 205; bun 16.0; creat 1.09; k+ 3.9; na++ 142 08-28-16: wbc 7.3; hgb 13.1; hct 43.5; mcv 83.2; plt 205; glucose 219; bun 24.1; creat 1.32; k+ 4.1; na++ 142; liver normal albumin 3.6  09-18-16:  wbc 11.2; hgb 11.2; hct 36.1; mcv 83.5; plt 180; urine culture: pseudomonas aeruginosa:  cipro  09-26-16; wbc 7.5; hgb 11.6; hct 39.7; mcv 82.9; plt 287; glucose 133; bun 13.8; creat 1.0;4; k+ 3.7; na++ 143  01-23-17: wbc 6.4; hgb 12.1; hct 39.8; mcv 82.0; plt 184; glucose 118; bun 17.5; creat 0.87; k+ 2.6; na++ 148; hgb a1c 7.6; uric acid 6.7  04-25-17: hgb a1c 7.4  NO NEW LABS   Review of Systems  Unable to perform ROS: Dementia (unable to fully answer questions. )   Physical Exam  Constitutional: No distress.  Eyes: Conjunctivae are normal.  Neck: Neck supple. No JVD present. No thyromegaly present.  Cardiovascular: Normal rate, regular rhythm and intact distal pulses.   Respiratory: Effort normal and breath sounds normal. No respiratory distress. He has no wheezes.  GI: Soft. Bowel sounds are normal. He exhibits no distension. There is no tenderness.  Genitourinary:  Genitourinary Comments: S/p foley   Musculoskeletal: He exhibits no edema.  Left hemiparesis  Lymphadenopathy:    He has no cervical adenopathy.  Neurological: He is alert.  Skin: Skin is warm and dry. He is not diaphoretic.  Psychiatric: He has a normal mood and affect.    ASSESSMENT/ PLAN:  TODAY:   1. Suprapubic catheter dysfunction: has large amounts of urine leaking. Will culture stoma and urine due to foul odor and tenderness;   Will get cbc; bmp and will have him return back to his urologist   MD is aware of resident's narcotic use and is in agreement with current plan of care. We will attempt to wean resident as apropriate   Ok Edwards NP [facility] Adult Medicine  Contact [phone] Monday through Friday 8am- 5pm  After hours call [phone]

## 2017-06-24 ENCOUNTER — Encounter: Payer: Self-pay | Admitting: Adult Health

## 2017-06-24 ENCOUNTER — Non-Acute Institutional Stay (SKILLED_NURSING_FACILITY): Payer: PPO | Admitting: Adult Health

## 2017-06-24 DIAGNOSIS — N390 Urinary tract infection, site not specified: Secondary | ICD-10-CM

## 2017-06-24 DIAGNOSIS — T83511D Infection and inflammatory reaction due to indwelling urethral catheter, subsequent encounter: Secondary | ICD-10-CM

## 2017-06-24 NOTE — Progress Notes (Signed)
Location:   [place] Room Number: 235 A Place of Service:  SNF (31)   CODE STATUS: DNR  Allergies  Allergen Reactions  . Pioglitazone Other (See Comments)    Edema   . Rosiglitazone Maleate Other (See Comments)    edema    Chief Complaint  Patient presents with  . Acute Visit    follow up cultures    HPI:  His urine culture did return with providencia stuartii. He is presently being treated with rocephin. There are no reports of fever; appetite changes or changes in behavior. He does have some leakage from his s/p on occasions.    Past Medical History:  Diagnosis Date  . Arthritis    "maybe"  . 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 CHF (congestive heart failure) ([place])   . Depression   . 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 Hyperlipemia   . Hypertension   . Sleep apnea    "my machine [place]; got to get me another one" (09/01/2014)  . Stroke [facility]) 2009   left thalamic infarct, residual LUE weakness  . TIA (transient ischemic attack)    multiple in past, most recently in 2011  . Type II diabetes mellitus ([place])     Past Surgical History:  Procedure Laterality Date  . CARDIAC CATHETERIZATION  07/2010  . CRANIOTOMY  05/2008   for clipping of an incidental asymptomatic unruptured/notes 02/13/2011  . KNEE ARTHROSCOPY Left   . 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 SHOULDER ARTHROSCOPY W/ ROTATOR CUFF REPAIR Right     Social History   Social History  . Marital status: Married    Spouse name: [name]  . Number of children: 3  . Years of education: HS   Occupational History  . disabled    Social History Main Topics  . Smoking status: Former Smoker    Packs/day: 2.00    Years: 20.00    Types: Cigarettes    Quit date: 10/16/1983  . Smokeless tobacco: Never Used  . Alcohol  use No  . Drug use: No  . Sexual activity: No   Other Topics Concern  . Not on file   Social History Narrative   Pt lives at home with spouse.   Caffeine Use: 1 cup daily   Family History  Problem Relation Age of Onset  . Stroke Mother   . Pneumonia Father   . Diabetes Sister   . Diabetes Brother   . Diabetes Paternal Aunt   . Colon cancer Neg Hx       VITAL SIGNS BP (!) 177/73   Pulse 69   Temp 99 F (37.2 C)   Resp 18   Ht 5\' 7"  (1.702 m)   Wt 194 lb 3.2 oz (88.1 kg)   SpO2 96%   BMI 30.42 kg/m   Patient's Medications  New Prescriptions   No medications on file  Previous Medications   AMLODIPINE (NORVASC) 5 MG TABLET    Take 5 mg by mouth every morning.    CARVEDILOL (COREG) 12.5 MG TABLET    Take 1 tablet (12.5 mg total) by mouth 2 (two) times daily.   CEFTRIAXONE (ROCEPHIN) 1 G INJECTION    Inject 1 g into the muscle daily. X 7 days   CLOPIDOGREL (PLAVIX) 75 MG  TABLET    Take 75 mg by mouth at bedtime.   DICLOFENAC SODIUM (VOLTAREN) 1 % GEL    Apply 4 g topically 4 (four) times daily. Apply to Bilateral Knees   DOCUSATE SODIUM PO    Take 1 capsule by mouth every 12 (twelve) hours as needed.   DONEPEZIL (ARICEPT) 10 MG TABLET    Take 1 tablet (10 mg total) by mouth at bedtime.   HYDRALAZINE (APRESOLINE) 50 MG TABLET    Take 75 mg by mouth 4 (four) times daily.    INSULIN ASPART (NOVOLOG) 100 UNIT/ML INJECTION    Inject 0-5 Units into the skin 3 (three) times daily before meals. Inject as per sliding scale: 0-59=0 call MD if less than 60.  60-150=0; 151-300 = 3 units 301-400=5 units call MD if >400   INSULIN GLARGINE (LANTUS) 100 UNIT/ML INJECTION    Inject 12 Units into the skin daily.    LISINOPRIL (PRINIVIL,ZESTRIL) 20 MG TABLET    Take 40 mg by mouth daily. Give 2 tablets by mouth daily    MEMANTINE (NAMENDA XR) 28 MG CP24 24 HR CAPSULE    Take 1 capsule (28 mg total) by mouth daily.   MIRABEGRON ER (MYRBETRIQ) 50 MG TB24 TABLET    Take 50 mg by mouth daily.     NITROGLYCERIN (NITROSTAT) 0.4 MG SL TABLET    Place 0.4 mg under the tongue every 5 (five) minutes as needed for chest pain.   PHENAZOPYRIDINE (PYRIDIUM) 100 MG TABLET    Take 100 mg by mouth 3 (three) times daily as needed for pain.   POLYETHYL GLYCOL-PROPYL GLYCOL (SYSTANE) 0.4-0.3 % SOLN    Apply 1 drop to eye 2 (two) times daily. Both eyes   POLYETHYLENE GLYCOL (MIRALAX / GLYCOLAX) PACKET    Take 17 g by mouth daily.   PREDNISOLONE ACETATE (PRED FORTE) 1 % OPHTHALMIC SUSPENSION    Place 1 drop into the left eye 4 (four) times daily.   SACCHAROMYCES BOULARDII (FLORASTOR) 250 MG CAPSULE    Take 250 mg by mouth 2 (two) times daily. Every 13 day   SENNA (SENOKOT) 8.6 MG TABLET    Take 1 tablet by mouth every 12 (twelve) hours as needed for constipation.   SERTRALINE (ZOLOFT) 25 MG TABLET    Take 1 tablet (25 mg total) by mouth daily.   SIMVASTATIN (ZOCOR) 20 MG TABLET    Take 1 tablet (20 mg total) by mouth at bedtime.   TAMSULOSIN (FLOMAX) 0.4 MG CAPS CAPSULE    Take 0.4 mg by mouth at bedtime.   TORSEMIDE (DEMADEX) 10 MG TABLET    Take 10 mg by mouth daily.   TRAMADOL (ULTRAM) 50 MG TABLET    Take 50 mg by mouth every 6 (six) hours as needed for moderate pain or severe pain.  Modified Medications   No medications on file  Discontinued Medications   No medications on file     [place]  08-28-16: chest x-ray: mild atelectasis left lung bases. No evidence of pneumonia or chf  TODAY:   06-21-17: chest x-ray: no acute cardiopulmonary disease    LABS REVIEWED: PREVIOUS    08-16-16: wound culture: mrsa 08-20-16; glucose 205; bun 24.3; creat 1.11; k+ 3.7; na++ 147 08-17-16: glucose 205; bun 16.0; creat 1.09; k+ 3.9; na++ 142 08-28-16: wbc 7.3; hgb 13.1; hct 43.5; mcv 83.2; plt 205; glucose 219; bun 24.1; creat 1.32; k+ 4.1; na++ 142; liver normal albumin 3.6  09-18-16: wbc  11.2; hgb 11.2; hct 36.1; mcv 83.5; plt 180; urine culture: pseudomonas aeruginosa:  cipro  09-26-16; wbc 7.5; hgb 11.6; hct 39.7; mcv 82.9; plt 287; glucose 133; bun 13.8; creat 1.0;4; k+ 3.7; na++ 143  01-23-17: wbc 6.4; hgb 12.1; hct 39.8; mcv 82.0; plt 184; glucose 118; bun 17.5; creat 0.87; k+ 2.6; na++ 148; hgb a1c 7.6; uric acid 6.7  04-25-17: hgb a1c 7.4  TODAY:   05-24-17: urine for micro-albumin 34.1  06-20-17: wbc 10.9; hgb 11.1; hct 35.8; mcv 81.5; plt 274; glucose 110; bun 19.4; creat 0.88; k+ 3.8; na++ 141; ca 9.0  Urine culture: providencia stuartii: rocephin    Review of Systems  Unable to perform ROS: Dementia (unable to answer questions fully )     Physical Exam  Constitutional: No distress.  Eyes: Conjunctivae are normal.  Neck: Neck supple. No JVD present. No thyromegaly present.  Cardiovascular: Normal rate, regular rhythm and intact distal pulses.   Respiratory: Effort normal and breath sounds normal. No respiratory distress. He has no wheezes.  GI: Soft. Bowel sounds are normal. He exhibits no distension. There is no tenderness.  Genitourinary:  Genitourinary Comments: Has s/p foley   Musculoskeletal: He exhibits no edema.  Left hemiparesis    Lymphadenopathy:    He has no cervical adenopathy.  Neurological: He is alert.  Skin: Skin is warm and dry. He is not diaphoretic.  Psychiatric: He has a normal mood and affect.   ASSESSMENT/ PLAN:  TODAY:   1. UTI: with providencia stuartii: will complete rocephin and will begin probiotic twice daily for 2 weeks and will monitor his status; his urology appointment is pending.    MD is aware of resident's narcotic use and is in agreement with current plan of care. We will attempt to wean resident as apropriate     Ok Edwards NP [facility]  Contact [phone] Monday through Friday 8am- 5pm  After hours call [phone]

## 2017-06-27 ENCOUNTER — Encounter: Payer: Self-pay | Admitting: Adult Health

## 2017-06-27 ENCOUNTER — Non-Acute Institutional Stay (SKILLED_NURSING_FACILITY): Payer: PPO | Admitting: Adult Health

## 2017-06-27 DIAGNOSIS — M170 Bilateral primary osteoarthritis of knee: Secondary | ICD-10-CM

## 2017-06-27 DIAGNOSIS — R339 Retention of urine, unspecified: Secondary | ICD-10-CM

## 2017-06-27 DIAGNOSIS — R609 Edema, unspecified: Secondary | ICD-10-CM

## 2017-06-27 DIAGNOSIS — F4323 Adjustment disorder with mixed anxiety and depressed mood: Secondary | ICD-10-CM

## 2017-06-27 NOTE — Progress Notes (Signed)
Location:   [place] Room Number: 628 A Place of Service:  SNF (31)   CODE STATUS: DNR  Allergies  Allergen Reactions  . Pioglitazone Other (See Comments)    Edema   . Rosiglitazone Maleate Other (See Comments)    edema    Chief Complaint  Patient presents with  . Annual Exam    HPI:  He is a 76 year old long term resident of this facility being seen for his annual exam. He has had a s/p foley inserted this past year. He is followed by [facility] for his urology needs. He cannot fully participate in the hpi and ros; but does tell me that he is feeling; no complaints of back pain. There are no reports of behavioral issues and no reports of changes in appetite. There are no nursing concerns at this time.    Past Medical History:  Diagnosis Date  . Arthritis    "maybe"  . 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 CHF (congestive heart failure) ([place])   . Depression   . 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 Hyperlipemia   . Hypertension   . Sleep apnea    "my machine [place]; got to get me another one" (09/01/2014)  . Stroke [facility]) 2009   left thalamic infarct, residual LUE weakness  . TIA (transient ischemic attack)    multiple in past, most recently in 2011  . Type II diabetes mellitus ([place])     Past Surgical History:  Procedure Laterality Date  . CARDIAC CATHETERIZATION  07/2010  . CRANIOTOMY  05/2008   for clipping of an incidental asymptomatic unruptured/notes 02/13/2011  . KNEE ARTHROSCOPY Left   . 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 SHOULDER ARTHROSCOPY W/ ROTATOR CUFF REPAIR Right     Social History   Social History  . Marital status: Married    Spouse name: [name]  . Number of children: 3  . Years of education: HS   Occupational History  . disabled    Social History Main  Topics  . Smoking status: Former Smoker    Packs/day: 2.00    Years: 20.00    Types: Cigarettes    Quit date: 10/16/1983  . Smokeless tobacco: Never Used  . Alcohol use No  . Drug use: No  . Sexual activity: No   Other Topics Concern  . Not on file   Social History Narrative   Pt lives at home with spouse.   Caffeine Use: 1 cup daily   Family History  Problem Relation Age of Onset  . Stroke Mother   . Pneumonia Father   . Diabetes Sister   . Diabetes Brother   . Diabetes Paternal Aunt   . Colon cancer Neg Hx       VITAL SIGNS BP (!) 148/70   Pulse 60   Temp 99 F (37.2 C)   Resp 18   Ht 5\' 7"  (1.702 m)   Wt 194 lb 3.2 oz (88.1 kg)   SpO2 96%   BMI 30.42 kg/m    Patient's Medications  New Prescriptions   No medications on file  Previous Medications   AMLODIPINE (NORVASC) 5 MG TABLET    Take 5 mg by mouth every morning.    CARVEDILOL (COREG) 12.5 MG TABLET  Take 1 tablet (12.5 mg total) by mouth 2 (two) times daily.   CLOPIDOGREL (PLAVIX) 75 MG TABLET    Take 75 mg by mouth at bedtime.   DICLOFENAC SODIUM (VOLTAREN) 1 % GEL    Apply 4 g topically 4 (four) times daily. Apply to Bilateral Knees   DOCUSATE SODIUM PO    Take 1 capsule by mouth every 12 (twelve) hours as needed.   DONEPEZIL (ARICEPT) 10 MG TABLET    Take 1 tablet (10 mg total) by mouth at bedtime.   HYDRALAZINE (APRESOLINE) 50 MG TABLET    Take 75 mg by mouth 4 (four) times daily.    INSULIN ASPART (NOVOLOG) 100 UNIT/ML INJECTION    Inject 0-5 Units into the skin 3 (three) times daily before meals. Inject as per sliding scale: 0-59=0 call MD if less than 60.  60-150=0; 151-300 = 3 units 301-400=5 units call MD if >400   INSULIN GLARGINE (LANTUS) 100 UNIT/ML INJECTION    Inject 12 Units into the skin daily.    LACTOBACILLUS TABS    Take 1 tablet by mouth 2 (two) times daily.   LISINOPRIL (PRINIVIL,ZESTRIL) 20 MG TABLET    Take 40 mg by mouth daily. Give 2 tablets by mouth daily    MEMANTINE (NAMENDA  XR) 28 MG CP24 24 HR CAPSULE    Take 1 capsule (28 mg total) by mouth daily.   MIRABEGRON ER (MYRBETRIQ) 50 MG TB24 TABLET    Take 50 mg by mouth daily.   NITROGLYCERIN (NITROSTAT) 0.4 MG SL TABLET    Place 0.4 mg under the tongue every 5 (five) minutes as needed for chest pain.   PHENAZOPYRIDINE (PYRIDIUM) 100 MG TABLET    Take 100 mg by mouth 3 (three) times daily as needed for pain.   POLYETHYL GLYCOL-PROPYL GLYCOL (SYSTANE) 0.4-0.3 % SOLN    Apply 1 drop to eye 2 (two) times daily. Both eyes   POLYETHYLENE GLYCOL (MIRALAX / GLYCOLAX) PACKET    Take 17 g by mouth daily.   PREDNISOLONE ACETATE (PRED FORTE) 1 % OPHTHALMIC SUSPENSION    Place 1 drop into the left eye 4 (four) times daily.   SACCHAROMYCES BOULARDII (FLORASTOR) 250 MG CAPSULE    Take 250 mg by mouth 2 (two) times daily. Every 13 day   SENNA (SENOKOT) 8.6 MG TABLET    Take 1 tablet by mouth every 12 (twelve) hours as needed for constipation.   SERTRALINE (ZOLOFT) 25 MG TABLET    Take 1 tablet (25 mg total) by mouth daily.   SIMVASTATIN (ZOCOR) 20 MG TABLET    Take 1 tablet (20 mg total) by mouth at bedtime.   TAMSULOSIN (FLOMAX) 0.4 MG CAPS CAPSULE    Take 0.4 mg by mouth at bedtime.   TORSEMIDE (DEMADEX) 10 MG TABLET    Take 10 mg by mouth daily.   TRAMADOL (ULTRAM) 50 MG TABLET    Take 50 mg by mouth every 6 (six) hours as needed for moderate pain or severe pain.  Modified Medications   No medications on file  Discontinued Medications   No medications on file     [place]  08-28-16: chest x-ray: mild atelectasis left lung bases. No evidence of pneumonia or chf  NO NEW EXAMS    LABS REVIEWED: PREVIOUS    08-16-16: wound culture: mrsa 08-20-16; glucose 205; bun 24.3; creat 1.11; k+ 3.7; na++ 147 08-17-16: glucose 205; bun 16.0; creat 1.09; k+ 3.9; na++ 142 08-28-16:  wbc 7.3; hgb 13.1; hct 43.5; mcv 83.2; plt 205; glucose 219; bun 24.1; creat 1.32; k+ 4.1; na++ 142; liver normal albumin 3.6   09-18-16: wbc 11.2; hgb 11.2; hct 36.1; mcv 83.5; plt 180; urine culture: pseudomonas aeruginosa: cipro  09-26-16; wbc 7.5; hgb 11.6; hct 39.7; mcv 82.9; plt 287; glucose 133; bun 13.8; creat 1.0;4; k+ 3.7; na++ 143  01-23-17: wbc 6.4; hgb 12.1; hct 39.8; mcv 82.0; plt 184; glucose 118; bun 17.5; creat 0.87; k+ 2.6; na++ 148; hgb a1c 7.6; uric acid 6.7  04-25-17: hgb a1c 7.4  NO NEW LABS   Review of Systems  Unable to perform ROS: Dementia (unable to fully answer questions )   Physical Exam  Constitutional: No distress.  HENT:  Head: Normocephalic.  Eyes: Conjunctivae are normal.  Neck: Neck supple. No thyromegaly present.  Cardiovascular: Normal rate, regular rhythm, normal heart sounds and intact distal pulses.   Pulmonary/Chest: Effort normal and breath sounds normal. No respiratory distress.  Abdominal: Soft. Bowel sounds are normal. He exhibits no distension.  Genitourinary:  Genitourinary Comments: Has s/p catheter   Musculoskeletal:  Has left hemiparesis   Lymphadenopathy:    He has no cervical adenopathy.  Skin: He is not diaphoretic.    ASSESSMENT/ PLAN:   TODAY:   1. Urine retention: is without change: has s/p foley is followed by urology at [facility]. Will continue flomax 0.4 mg daily and myrbetriq 50 mg daily   2. Adjustment disorder: with depression: is stable  will continue zoloft 25 mg daily   3. Lower extremity edema: is stable  will continue demadex 10 mg daily   4. Osteoarthritis bilateral knees: stable will continue voltaren gel 4 gm to both knees four times daily   PREVIOUS   5. CVA: left hemiparesis: is neurologically stable; will continue plavix 75 mg daily  6. Hypertension: stable b/p  148/70  will continue coreg 12.5 mg twice daily lisinopril 40 mg daily norvasc 5 mg daily   hydralazine  75 mg four times  daily   7. Diabetes: stable hgb a1c 7.4 will continue novolog SSI: 60-150=3units; 151-400=5 units lantus 12 units nightly   8. Vascular dementia: no  significant change in status will continue aricept 10 mg nightly and namdena xr 28 mg daily   Weight is 196 pounds ( he did respond to remeron)  9. Dyslipidemia:stable  will continue zocor 20 mg daily  His health care management is up to date.    MD is aware of resident's narcotic use and is in agreement with current plan of care. We will attempt to wean resident as apropriate   Ok Edwards NP [facility]  Contact [phone] Monday through Friday 8am- 5pm  After hours call [phone]

## 2017-07-07 DIAGNOSIS — T83511A Infection and inflammatory reaction due to indwelling urethral catheter, initial encounter: Secondary | ICD-10-CM

## 2017-07-07 DIAGNOSIS — N390 Urinary tract infection, site not specified: Secondary | ICD-10-CM | POA: Insufficient documentation

## 2017-07-09 DIAGNOSIS — M170 Bilateral primary osteoarthritis of knee: Secondary | ICD-10-CM | POA: Insufficient documentation

## 2017-07-11 ENCOUNTER — Encounter: Payer: Self-pay | Admitting: Adult Health

## 2017-07-11 ENCOUNTER — Non-Acute Institutional Stay (SKILLED_NURSING_FACILITY): Payer: PPO | Admitting: Adult Health

## 2017-07-11 DIAGNOSIS — I10 Essential (primary) hypertension: Secondary | ICD-10-CM | POA: Diagnosis not present

## 2017-07-11 NOTE — Progress Notes (Signed)
Location:   [place] Room Number: 509 A Place of Service:  SNF (31)   CODE STATUS: Full code until further notice  Allergies  Allergen Reactions  . Pioglitazone Other (See Comments)    Edema   . Rosiglitazone Maleate Other (See Comments)    edema    Chief Complaint  Patient presents with  . Acute Visit    Hypertension    HPI:  I have been asked to see him for an elevated blood pressure: his range is 134-177/60-84.  He is unable to fully participate in the hpi or ros. There are no reports of headache; no reports of shortness of breath or chest pain.   Past Medical History:  Diagnosis Date  . Arthritis    "maybe"  . 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 CHF (congestive heart failure) ([place])   . Depression   . 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 Hyperlipemia   . Hypertension   . Sleep apnea    "my machine [place]; got to get me another one" (09/01/2014)  . Stroke [facility]) 2009   left thalamic infarct, residual LUE weakness  . TIA (transient ischemic attack)    multiple in past, most recently in 2011  . Type II diabetes mellitus ([place])     Past Surgical History:  Procedure Laterality Date  . CARDIAC CATHETERIZATION  07/2010  . CRANIOTOMY  05/2008   for clipping of an incidental asymptomatic unruptured/notes 02/13/2011  . KNEE ARTHROSCOPY Left   . 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 SHOULDER ARTHROSCOPY W/ ROTATOR CUFF REPAIR Right     Social History   Social History  . Marital status: Married    Spouse name: [name]  . Number of children: 3  . Years of education: HS   Occupational History  . disabled    Social History Main Topics  . Smoking status: Former Smoker    Packs/day: 2.00    Years: 20.00    Types: Cigarettes    Quit date: 10/16/1983  . Smokeless tobacco:  Never Used  . Alcohol use No  . Drug use: No  . Sexual activity: No   Other Topics Concern  . Not on file   Social History Narrative   Pt lives at home with spouse.   Caffeine Use: 1 cup daily   Family History  Problem Relation Age of Onset  . Stroke Mother   . Pneumonia Father   . Diabetes Sister   . Diabetes Brother   . Diabetes Paternal Aunt   . Colon cancer Neg Hx       VITAL SIGNS BP (!) 134/56   Pulse (!) 50   Ht 5\' 7"  (1.702 m)   Wt 191 lb 4.8 oz (86.8 kg)   BMI 29.96 kg/m   Patient's Medications  New Prescriptions   No medications on file  Previous Medications   AMLODIPINE (NORVASC) 5 MG TABLET    Take 5 mg by mouth every morning.    CARVEDILOL (COREG) 12.5 MG TABLET    Take 1 tablet (12.5 mg total) by mouth 2 (two) times daily.   CLOPIDOGREL (PLAVIX) 75 MG TABLET    Take 75 mg by mouth at bedtime.   DICLOFENAC SODIUM (VOLTAREN) 1 % GEL    Apply 4 g topically 4 (  four) times daily. Apply to Bilateral Knees   DOCUSATE SODIUM PO    Take 1 capsule by mouth every 12 (twelve) hours as needed.   DONEPEZIL (ARICEPT) 10 MG TABLET    Take 1 tablet (10 mg total) by mouth at bedtime.   HYDRALAZINE (APRESOLINE) 50 MG TABLET    Take 75 mg by mouth 4 (four) times daily.    INSULIN ASPART (NOVOLOG) 100 UNIT/ML INJECTION    Inject 0-5 Units into the skin 3 (three) times daily before meals. Inject as per sliding scale: 0-59=0 call MD if less than 60.  60-150=0; 151-300 = 3 units 301-400=5 units call MD if >400   INSULIN GLARGINE (LANTUS) 100 UNIT/ML INJECTION    Inject 12 Units into the skin daily.    LISINOPRIL (PRINIVIL,ZESTRIL) 20 MG TABLET    Take 40 mg by mouth daily. Give 2 tablets by mouth daily    MEMANTINE (NAMENDA XR) 28 MG CP24 24 HR CAPSULE    Take 1 capsule (28 mg total) by mouth daily.   MIRABEGRON ER (MYRBETRIQ) 50 MG TB24 TABLET    Take 50 mg by mouth daily.   NITROGLYCERIN (NITROSTAT) 0.4 MG SL TABLET    Place 0.4 mg under the tongue every 5 (five) minutes as  needed for chest pain.   PHENAZOPYRIDINE (PYRIDIUM) 100 MG TABLET    Take 100 mg by mouth 3 (three) times daily as needed for pain.   POLYETHYL GLYCOL-PROPYL GLYCOL (SYSTANE) 0.4-0.3 % SOLN    Apply 1 drop to eye 2 (two) times daily. Both eyes   POLYETHYLENE GLYCOL (MIRALAX / GLYCOLAX) PACKET    Take 17 g by mouth daily.   PREDNISOLONE ACETATE (PRED FORTE) 1 % OPHTHALMIC SUSPENSION    Place 1 drop into the left eye 4 (four) times daily.   SACCHAROMYCES BOULARDII (FLORASTOR) 250 MG CAPSULE    Take 250 mg by mouth 2 (two) times daily. Every 13 day   SENNA (SENOKOT) 8.6 MG TABLET    Take 1 tablet by mouth every 12 (twelve) hours as needed for constipation.   SERTRALINE (ZOLOFT) 25 MG TABLET    Take 1 tablet (25 mg total) by mouth daily.   SIMVASTATIN (ZOCOR) 20 MG TABLET    Take 1 tablet (20 mg total) by mouth at bedtime.   TAMSULOSIN (FLOMAX) 0.4 MG CAPS CAPSULE    Take 0.4 mg by mouth at bedtime.   TORSEMIDE (DEMADEX) 10 MG TABLET    Take 10 mg by mouth daily.   TRAMADOL (ULTRAM) 50 MG TABLET    Take 50 mg by mouth every 6 (six) hours as needed for moderate pain or severe pain.  Modified Medications   No medications on file  Discontinued Medications   LACTOBACILLUS TABS    Take 1 tablet by mouth 2 (two) times daily.     SIGNIFICANT DIAGNOSTIC EXAMS  PREVIOUS  08-28-16: chest x-ray: mild atelectasis left lung bases. No evidence of pneumonia or chf  NO NEW EXAMS    LABS REVIEWED: PREVIOUS    08-16-16: wound culture: mrsa 08-20-16; glucose 205; bun 24.3; creat 1.11; k+ 3.7; na++ 147 08-17-16: glucose 205; bun 16.0; creat 1.09; k+ 3.9; na++ 142 08-28-16: wbc 7.3; hgb 13.1; hct 43.5; mcv 83.2; plt 205; glucose 219; bun 24.1; creat 1.32; k+ 4.1; na++ 142; liver normal albumin 3.6  09-18-16: wbc 11.2; hgb 11.2; hct 36.1; mcv 83.5; plt 180; urine culture: pseudomonas aeruginosa: cipro  09-26-16; wbc 7.5; hgb 11.6; hct 39.7; mcv 82.9; plt  287; glucose 133; bun 13.8; creat 1.0;4; k+ 3.7; na++ 143   01-23-17: wbc 6.4; hgb 12.1; hct 39.8; mcv 82.0; plt 184; glucose 118; bun 17.5; creat 0.87; k+ 2.6; na++ 148; hgb a1c 7.6; uric acid 6.7  04-25-17: hgb a1c 7.4  NO NEW LABS    Review of Systems  Unable to perform ROS: Dementia (unable to answer questions )    Physical Exam  Constitutional: He appears well-developed and well-nourished. No distress.  Eyes: Conjunctivae are normal.  Neck: Neck supple. No thyromegaly present.  Cardiovascular: Regular rhythm, normal heart sounds and intact distal pulses.   Pulmonary/Chest: Effort normal and breath sounds normal. No respiratory distress. He has no wheezes.  Abdominal: Soft. Bowel sounds are normal. He exhibits no distension. There is no tenderness.  Genitourinary:  Genitourinary Comments: Has s/p foley   Musculoskeletal:  Has left hemiparesis   Lymphadenopathy:    He has no cervical adenopathy.  Neurological: He is alert.  Skin: Skin is dry. He is not diaphoretic.     ASSESSMENT/ PLAN:   TODAY:   1. Hypertension: stable b/p  134/56 worse:   will continue coreg 12.5 mg twice daily lisinopril 40 mg daily  hydralazine  75 mg four times  daily  And will increase norvasc to 7.5 mg daily    MD is aware of resident's narcotic use and is in agreement with current plan of care. We will attempt to wean resident as apropriate    Ok Edwards NP [facility]  Contact [phone] Monday through Friday 8am- 5pm  After hours call [phone]

## 2017-07-15 DIAGNOSIS — Z79899 Other long term (current) drug therapy: Secondary | ICD-10-CM | POA: Diagnosis not present

## 2017-07-15 DIAGNOSIS — R319 Hematuria, unspecified: Secondary | ICD-10-CM | POA: Diagnosis not present

## 2017-07-15 DIAGNOSIS — N390 Urinary tract infection, site not specified: Secondary | ICD-10-CM | POA: Diagnosis not present

## 2017-07-31 ENCOUNTER — Non-Acute Institutional Stay (SKILLED_NURSING_FACILITY): Payer: PPO | Admitting: Adult Health

## 2017-07-31 ENCOUNTER — Encounter: Payer: Self-pay | Admitting: Adult Health

## 2017-07-31 DIAGNOSIS — E785 Hyperlipidemia, unspecified: Secondary | ICD-10-CM | POA: Diagnosis not present

## 2017-07-31 DIAGNOSIS — E1149 Type 2 diabetes mellitus with other diabetic neurological complication: Secondary | ICD-10-CM

## 2017-07-31 DIAGNOSIS — F0150 Vascular dementia without behavioral disturbance: Secondary | ICD-10-CM

## 2017-07-31 DIAGNOSIS — I10 Essential (primary) hypertension: Secondary | ICD-10-CM

## 2017-07-31 DIAGNOSIS — I6350 Cerebral infarction due to unspecified occlusion or stenosis of unspecified cerebral artery: Secondary | ICD-10-CM

## 2017-07-31 DIAGNOSIS — E1169 Type 2 diabetes mellitus with other specified complication: Secondary | ICD-10-CM | POA: Diagnosis not present

## 2017-07-31 NOTE — Progress Notes (Signed)
Location:   [place] Room Number: 034 A Place of Service:  SNF (31)   CODE STATUS: Full Code  Allergies  Allergen Reactions  . Pioglitazone Other (See Comments)    Edema   . Rosiglitazone Maleate Other (See Comments)    edema    Chief Complaint  Patient presents with  . Medical Management of Chronic Issues    Cva; hypertension; diabetes; dementia; dyslipidemia     HPI:  He is a 76 year old long term resident of this facility being seen for the management of his chronic illnesses: cerebral artery occlusion with cerebral infarction; essential hypertension; hyperlipidemia associated with type 2 diabetes; type II diabetes mellitus with neurological manifestations;  And vascular dementia. He is unable to fully participate in the hpi or ros. There are no reports of any changes in behavior; any changes in appetite; no headaches noted; no reports of insomnia present. There are no nursing concerns.    Past Medical History:  Diagnosis Date  . Arthritis    "maybe"  . 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 CHF (congestive heart failure) ([place])   . Depression   . 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 Hyperlipemia   . Hypertension   . Sleep apnea    "my machine [place]; got to get me another one" (09/01/2014)  . Stroke [facility]) 2009   left thalamic infarct, residual LUE weakness  . TIA (transient ischemic attack)    multiple in past, most recently in 2011  . Type II diabetes mellitus ([place])     Past Surgical History:  Procedure Laterality Date  . CARDIAC CATHETERIZATION  07/2010  . CRANIOTOMY  05/2008   for clipping of an incidental asymptomatic unruptured/notes 02/13/2011  . KNEE ARTHROSCOPY Left   . 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 SHOULDER ARTHROSCOPY W/ ROTATOR CUFF REPAIR Right      Social History   Social History  . Marital status: Married    Spouse name: [name]  . Number of children: 3  . Years of education: HS   Occupational History  . disabled    Social History Main Topics  . Smoking status: Former Smoker    Packs/day: 2.00    Years: 20.00    Types: Cigarettes    Quit date: 10/16/1983  . Smokeless tobacco: Never Used  . Alcohol use No  . Drug use: No  . Sexual activity: No   Other Topics Concern  . Not on file   Social History Narrative   Pt lives at home with spouse.   Caffeine Use: 1 cup daily   Family History  Problem Relation Age of Onset  . Stroke Mother   . Pneumonia Father   . Diabetes Sister   . Diabetes Brother   . Diabetes Paternal Aunt   . Colon cancer Neg Hx       VITAL SIGNS BP (!) 144/80   Pulse 78   Temp 97.7 F (36.5 C)   Resp 18   Ht 5\' 7"  (1.702 m)   Wt 187 lb 9.6 oz (85.1 kg)   SpO2 96%   BMI 29.38 kg/m   Patient's Medications  New Prescriptions   No medications on file  Previous Medications   AMLODIPINE (NORVASC) 5 MG TABLET    Take 5 mg  by mouth every morning.    CARVEDILOL (COREG) 12.5 MG TABLET    Take 1 tablet (12.5 mg total) by mouth 2 (two) times daily.   CLOPIDOGREL (PLAVIX) 75 MG TABLET    Take 75 mg by mouth at bedtime.   DICLOFENAC SODIUM (VOLTAREN) 1 % GEL    Apply 4 g topically 4 (four) times daily. Apply to Bilateral Knees   DOCUSATE SODIUM PO    Take 1 capsule by mouth every 12 (twelve) hours as needed.   DONEPEZIL (ARICEPT) 10 MG TABLET    Take 1 tablet (10 mg total) by mouth at bedtime.   HYDRALAZINE (APRESOLINE) 50 MG TABLET    Take 75 mg by mouth 4 (four) times daily.    INSULIN ASPART (NOVOLOG) 100 UNIT/ML INJECTION    Inject 0-5 Units into the skin 3 (three) times daily before meals. Inject as per sliding scale: 0-59=0 call MD if less than 60.  60-150=0; 151-300 = 3 units 301-400=5 units call MD if >400   INSULIN GLARGINE (LANTUS) 100 UNIT/ML INJECTION    Inject 12 Units into the skin  daily.    LISINOPRIL (PRINIVIL,ZESTRIL) 20 MG TABLET    Take 40 mg by mouth daily. Give 2 tablets by mouth daily    MEMANTINE (NAMENDA XR) 28 MG CP24 24 HR CAPSULE    Take 1 capsule (28 mg total) by mouth daily.   MIRABEGRON ER (MYRBETRIQ) 50 MG TB24 TABLET    Take 50 mg by mouth daily.   NITROGLYCERIN (NITROSTAT) 0.4 MG SL TABLET    Place 0.4 mg under the tongue every 5 (five) minutes as needed for chest pain.   PHENAZOPYRIDINE (PYRIDIUM) 100 MG TABLET    Take 100 mg by mouth 3 (three) times daily as needed for pain.   POLYETHYL GLYCOL-PROPYL GLYCOL (SYSTANE) 0.4-0.3 % SOLN    Apply 1 drop to eye 2 (two) times daily. Both eyes   POLYETHYLENE GLYCOL (MIRALAX / GLYCOLAX) PACKET    Take 17 g by mouth daily.   PREDNISOLONE ACETATE (PRED FORTE) 1 % OPHTHALMIC SUSPENSION    Place 1 drop into the left eye 4 (four) times daily.   SACCHAROMYCES BOULARDII (FLORASTOR) 250 MG CAPSULE    Take 250 mg by mouth 2 (two) times daily. Every 13 day   SENNA (SENOKOT) 8.6 MG TABLET    Take 1 tablet by mouth every 12 (twelve) hours as needed for constipation.   SERTRALINE (ZOLOFT) 25 MG TABLET    Take 1 tablet (25 mg total) by mouth daily.   SIMVASTATIN (ZOCOR) 20 MG TABLET    Take 1 tablet (20 mg total) by mouth at bedtime.   TAMSULOSIN (FLOMAX) 0.4 MG CAPS CAPSULE    Take 0.4 mg by mouth at bedtime.   TORSEMIDE (DEMADEX) 10 MG TABLET    Take 10 mg by mouth daily.   TRAMADOL (ULTRAM) 50 MG TABLET    Take 50 mg by mouth every 6 (six) hours as needed for moderate pain or severe pain.  Modified Medications   No medications on file  Discontinued Medications   No medications on file     [place]  08-28-16: chest x-ray: mild atelectasis left lung bases. No evidence of pneumonia or chf  TODAY:   06-20-17: chest x-ray: no acute cardiopulmonary disease    LABS REVIEWED: PREVIOUS    08-16-16: wound culture: mrsa 08-20-16; glucose 205; bun 24.3; creat 1.11; k+ 3.7; na++ 147 08-17-16:  glucose 205; bun 16.0; creat  1.09; k+ 3.9; na++ 142 08-28-16: wbc 7.3; hgb 13.1; hct 43.5; mcv 83.2; plt 205; glucose 219; bun 24.1; creat 1.32; k+ 4.1; na++ 142; liver normal albumin 3.6  09-18-16: wbc 11.2; hgb 11.2; hct 36.1; mcv 83.5; plt 180; urine culture: pseudomonas aeruginosa: cipro  09-26-16; wbc 7.5; hgb 11.6; hct 39.7; mcv 82.9; plt 287; glucose 133; bun 13.8; creat 1.0;4; k+ 3.7; na++ 143  01-23-17: wbc 6.4; hgb 12.1; hct 39.8; mcv 82.0; plt 184; glucose 118; bun 17.5; creat 0.87; k+ 2.6; na++ 148; hgb a1c 7.6; uric acid 6.7  04-25-17: hgb a1c 7.4  TODAY:   06-20-17: wbc 10.9; hgb 11.1; hct 35.8; mcv 81.5 ;plt 274; glucose 110; bun 19.4; creat 0.88; k+ 3.8; na++ 141 ca 9.0; urine culture: providencia stuartii: cipro  Review of Systems  Unable to perform ROS: Dementia (unable to fully participate )   Physical Exam  Constitutional: He appears well-developed and well-nourished. No distress.  Neck: Neck supple. No thyromegaly present.  Cardiovascular: Normal rate, regular rhythm, normal heart sounds and intact distal pulses.  Pulmonary/Chest: Effort normal and breath sounds normal. No respiratory distress.  Abdominal: Soft. Bowel sounds are normal. He exhibits no distension. There is no tenderness.  Genitourinary:  Genitourinary Comments: Has suprapubic catheter   Musculoskeletal:  Has left hemiparesis   Lymphadenopathy:    He has no cervical adenopathy.  Neurological: He is alert.  Skin: Skin is warm and dry. He is not diaphoretic.  Psychiatric: He has a normal mood and affect.       ASSESSMENT/ PLAN:  TODAY:   1. 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 Hypertension: stable b/p  114/80  will continue coreg 12.5 mg twice daily lisinopril 40 mg daily norvasc 5 mg daily   hydralazine  75 mg four times  daily   3. Diabetes: stable hgb a1c 7.4 will continue novolog SSI: 60-150=3units; 151-400=5 units lantus 12 units nightly   4.  Vascular dementia: no significant change in status will continue aricept 10 mg nightly and namdena xr 28 mg daily   Weight is 186 pounds ( he did respond to remeron)  5. Dyslipidemia:stable  will continue zocor 20 mg daily   PREVIOUS   6. Urine retention: is without change: has s/p foley is followed by urology at [facility]. Will continue flomax 0.4 mg daily and myrbetriq 50 mg daily   7. Adjustment disorder: with depression: is stable  will continue zoloft 25 mg daily   8. Lower extremity edema: is stable  will continue demadex 10 mg daily   9. Osteoarthritis bilateral knees: stable will continue voltaren gel 4 gm to both knees four times daily     MD is aware of resident's narcotic use and is in agreement with current plan of care. We will attempt to wean resident as apropriate     Ok Edwards NP [facility]  Contact [phone] Monday through Friday 8am- 5pm  After hours call [phone]

## 2017-08-01 DIAGNOSIS — E039 Hypothyroidism, unspecified: Secondary | ICD-10-CM | POA: Diagnosis not present

## 2017-08-01 DIAGNOSIS — D649 Anemia, unspecified: Secondary | ICD-10-CM | POA: Diagnosis not present

## 2017-08-01 DIAGNOSIS — I10 Essential (primary) hypertension: Secondary | ICD-10-CM | POA: Diagnosis not present

## 2017-08-01 LAB — CBC AND DIFFERENTIAL
HEMATOCRIT: 37 — AB (ref 41–53)
HEMOGLOBIN: 11.5 — AB (ref 13.5–17.5)
NEUTROS ABS: 5
Platelets: 280 (ref 150–399)
WBC: 7.6

## 2017-08-01 LAB — HEPATIC FUNCTION PANEL
ALT: 15 (ref 10–40)
AST: 11 — AB (ref 14–40)
Alkaline Phosphatase: 100 (ref 25–125)
Bilirubin, Total: 0.4

## 2017-08-01 LAB — BASIC METABOLIC PANEL
BUN: 21 (ref 4–21)
Creatinine: 1 (ref 0.6–1.3)
GLUCOSE: 137
Potassium: 3.7 (ref 3.4–5.3)
SODIUM: 148 — AB (ref 137–147)

## 2017-08-01 LAB — TSH: TSH: 1.5 (ref 0.41–5.90)

## 2017-08-07 IMAGING — CR DG CHEST 2V
3 series · 3 of 3 positions shown · non-contrast
Comparison: 11/01/2013

CLINICAL DATA: Patient presents from home with c/o weakness and
increased confusion x1 week. Patient has dementia and denies any
complaints, but wife states she has found patient on floor twice
today.

EXAM:
CHEST  2 VIEW

[w chest lat (1 of 2)]
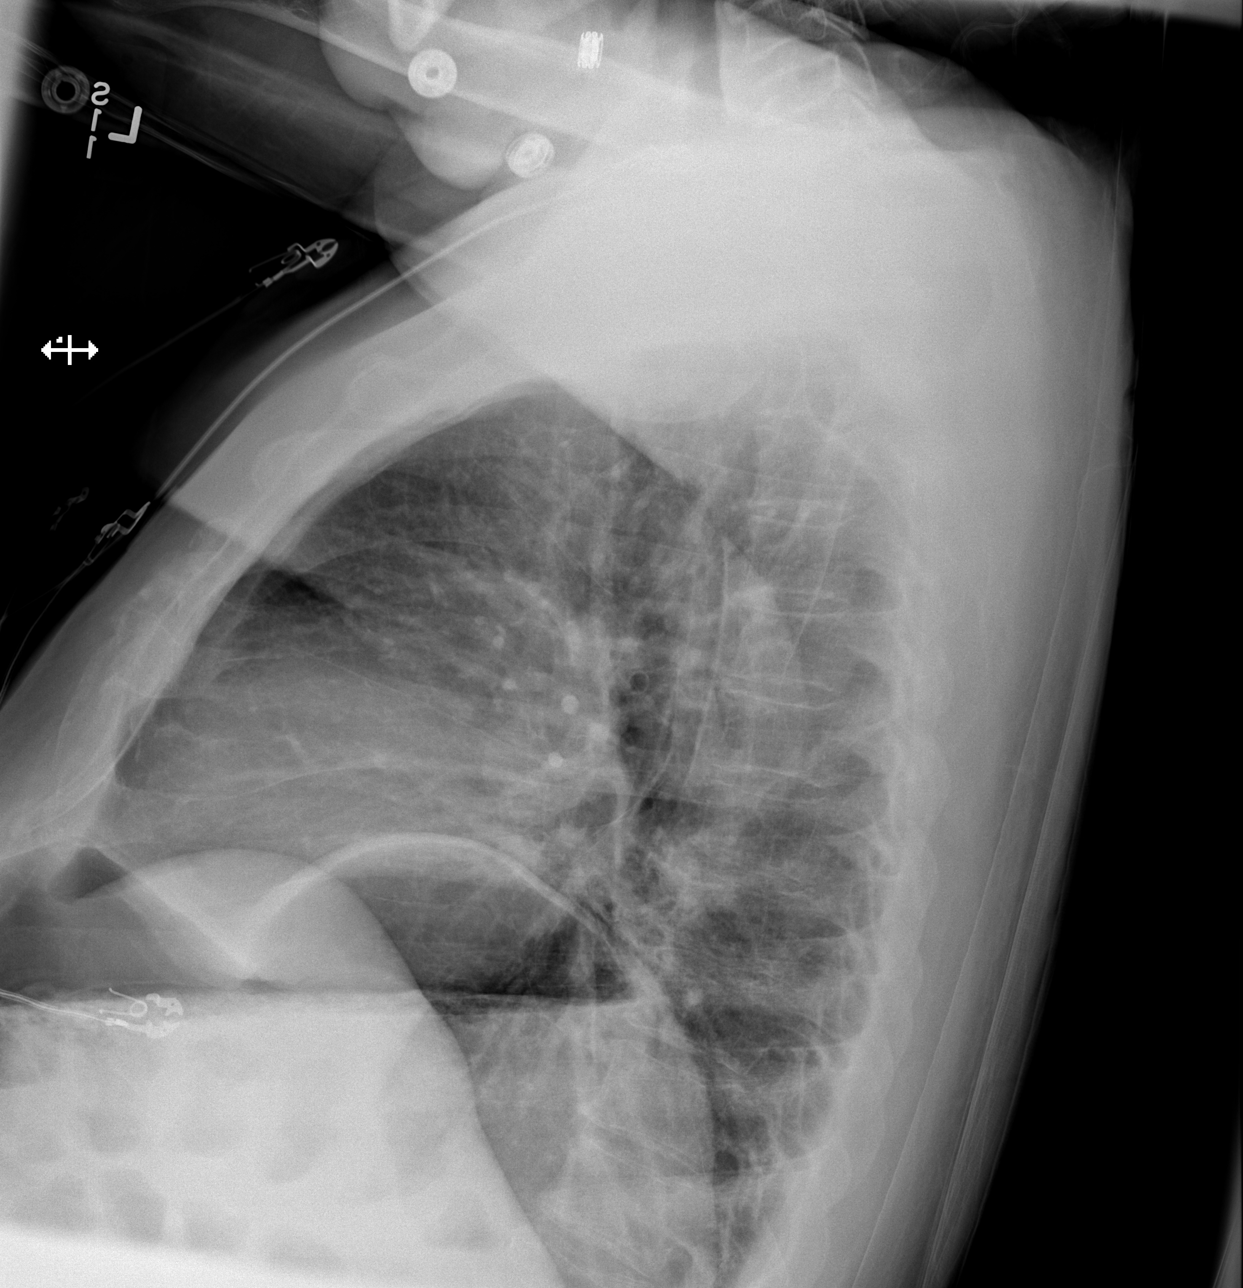

[w chest lat (2 of 2)]
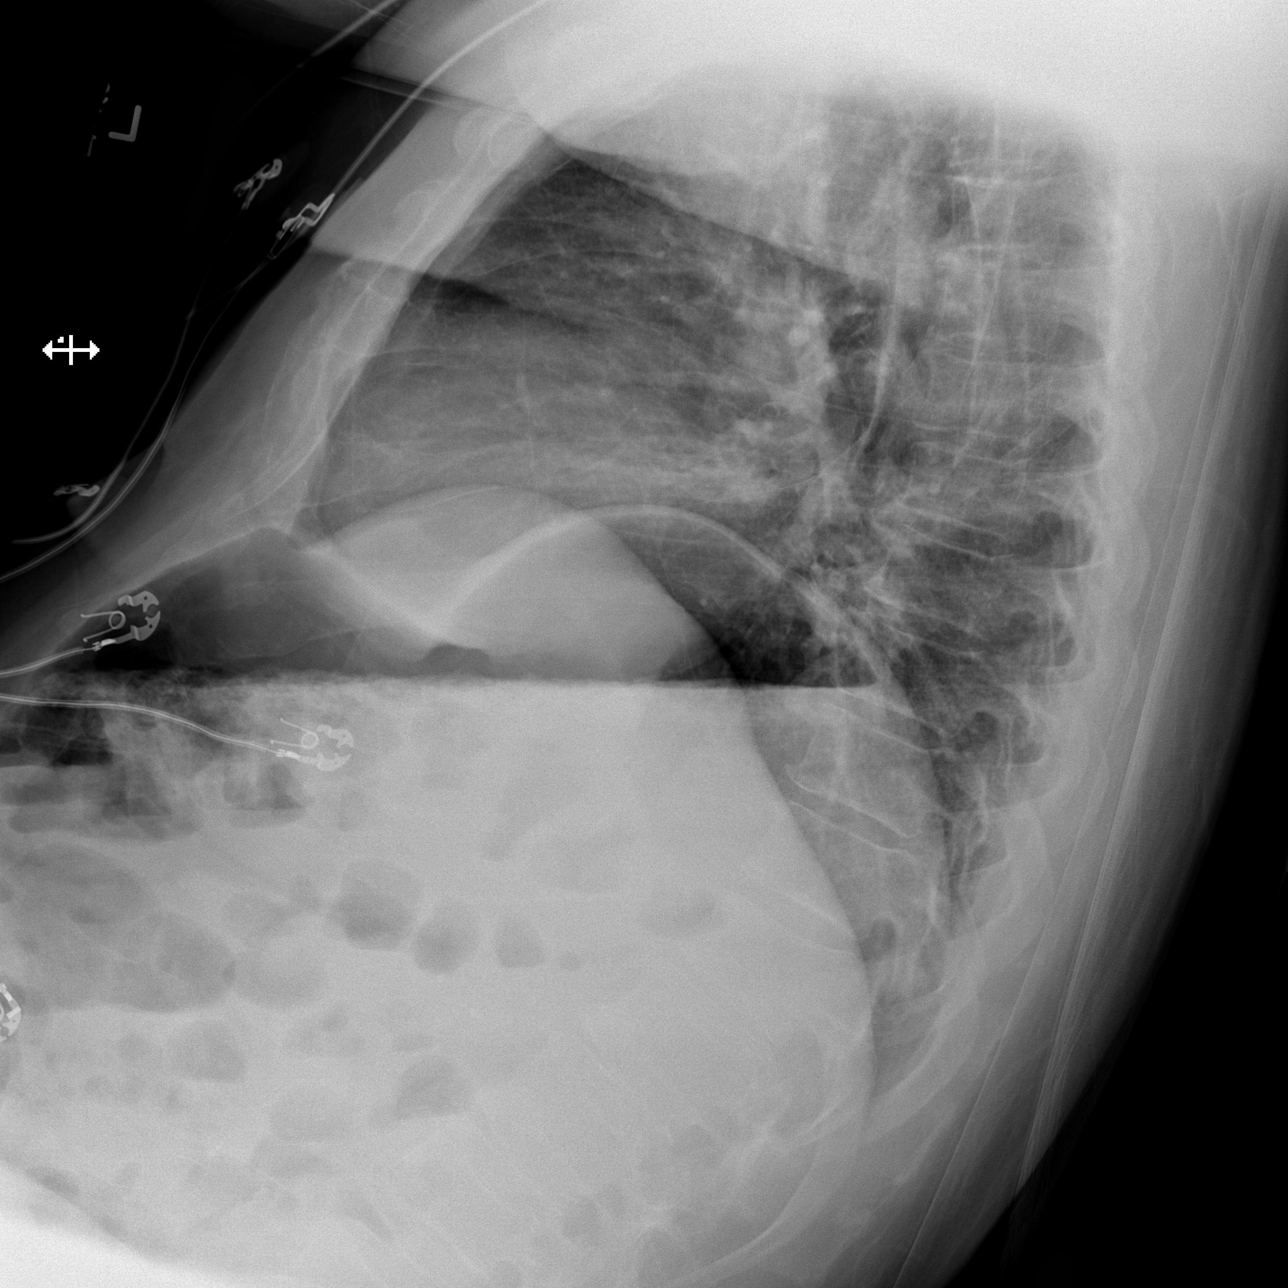

[x chest ap]
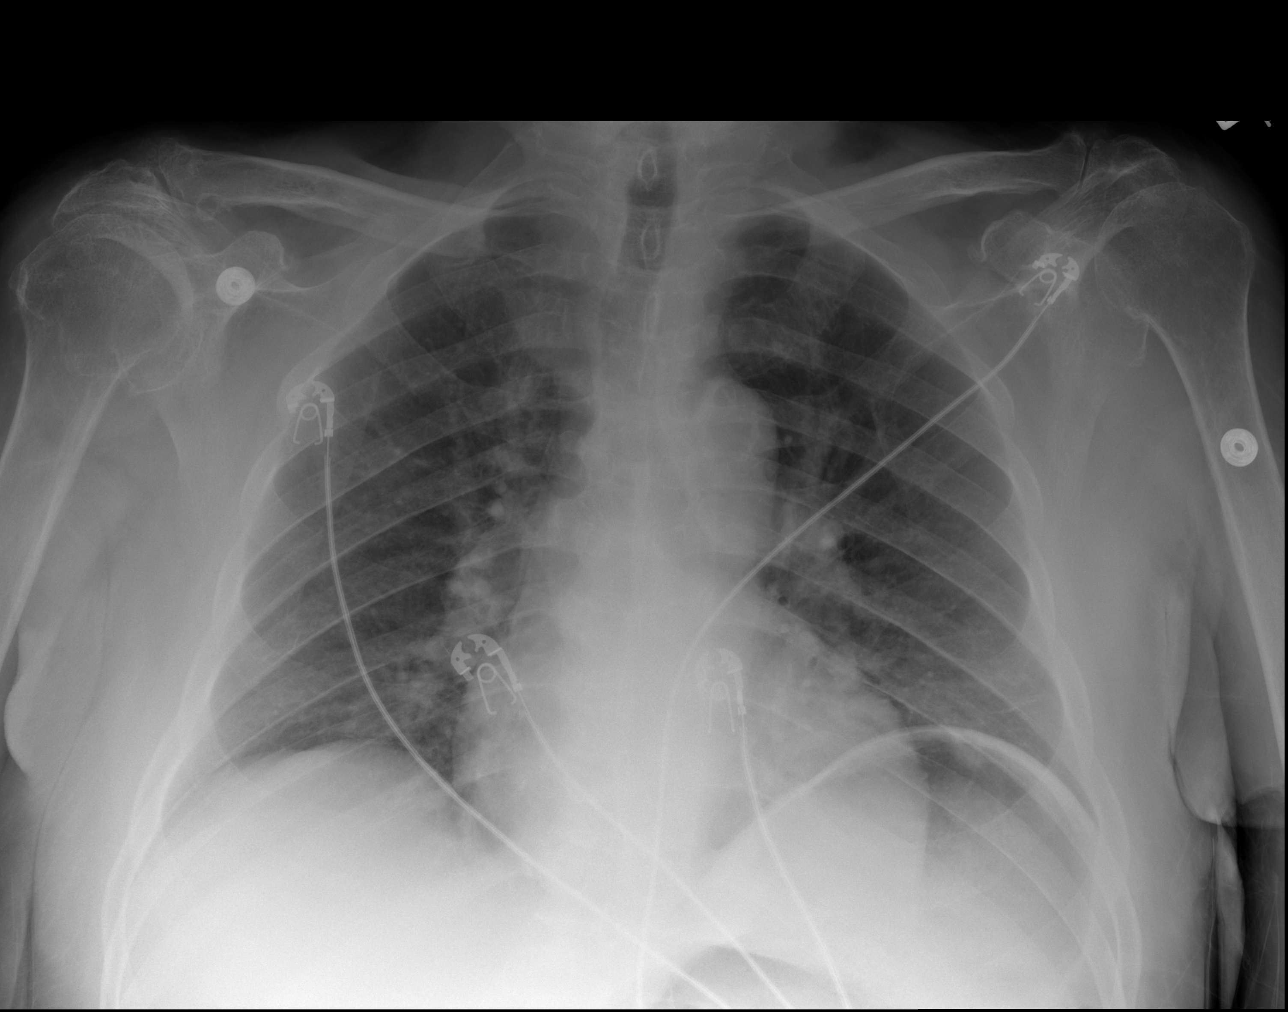

[3 of 3 positions shown; findings below may reference images not displayed]

FINDINGS: Normal mediastinum and cardiac silhouette. There is mild central
venous congestion. No effusion, infiltrate or pneumothorax. Chronic
elevation LEFT hemidiaphragm.
IMPRESSION: No acute cardiopulmonary process.

## 2017-08-12 ENCOUNTER — Encounter (INDEPENDENT_AMBULATORY_CARE_PROVIDER_SITE_OTHER): Payer: PPO | Admitting: Ophthalmology

## 2017-08-12 DIAGNOSIS — E11311 Type 2 diabetes mellitus with unspecified diabetic retinopathy with macular edema: Secondary | ICD-10-CM | POA: Diagnosis not present

## 2017-08-12 DIAGNOSIS — H35033 Hypertensive retinopathy, bilateral: Secondary | ICD-10-CM | POA: Diagnosis not present

## 2017-08-12 DIAGNOSIS — H35372 Puckering of macula, left eye: Secondary | ICD-10-CM

## 2017-08-12 DIAGNOSIS — H59032 Cystoid macular edema following cataract surgery, left eye: Secondary | ICD-10-CM

## 2017-08-12 DIAGNOSIS — E113212 Type 2 diabetes mellitus with mild nonproliferative diabetic retinopathy with macular edema, left eye: Secondary | ICD-10-CM | POA: Diagnosis not present

## 2017-08-12 DIAGNOSIS — I10 Essential (primary) hypertension: Secondary | ICD-10-CM | POA: Diagnosis not present

## 2017-08-12 DIAGNOSIS — H43813 Vitreous degeneration, bilateral: Secondary | ICD-10-CM

## 2017-08-12 DIAGNOSIS — E113291 Type 2 diabetes mellitus with mild nonproliferative diabetic retinopathy without macular edema, right eye: Secondary | ICD-10-CM

## 2017-08-19 ENCOUNTER — Non-Acute Institutional Stay (SKILLED_NURSING_FACILITY): Payer: PPO | Admitting: Adult Health

## 2017-08-19 ENCOUNTER — Encounter: Payer: Self-pay | Admitting: Adult Health

## 2017-08-19 DIAGNOSIS — M549 Dorsalgia, unspecified: Secondary | ICD-10-CM | POA: Diagnosis not present

## 2017-08-19 NOTE — Progress Notes (Signed)
Location:   [place] Room Number: 423 A Place of Service:  SNF (31)   CODE STATUS: DNR  Allergies  Allergen Reactions  . Pioglitazone Other (See Comments)    Edema   . Rosiglitazone Maleate Other (See Comments)    edema    Chief Complaint  Patient presents with  . Acute Visit    Back Pain    HPI:  He is complaining of mid back pain which gets worse with movement. He denies any falls or injuries. He tells me that the pain makes it hard for him to get out of bed and to sleep. He has not taken any pain medication per nursing staff.   Past Medical History:  Diagnosis Date  . Arthritis    "maybe"  . 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 CHF (congestive heart failure) ([place])   . Depression   . 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 Hyperlipemia   . Hypertension   . Sleep apnea    "my machine [place]; got to get me another one" (09/01/2014)  . Stroke [facility]) 2009   left thalamic infarct, residual LUE weakness  . TIA (transient ischemic attack)    multiple in past, most recently in 2011  . Type II diabetes mellitus ([place])     Past Surgical History:  Procedure Laterality Date  . CARDIAC CATHETERIZATION  07/2010  . CRANIOTOMY  05/2008   for clipping of an incidental asymptomatic unruptured/notes 02/13/2011  . KNEE ARTHROSCOPY Left   . 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 SHOULDER ARTHROSCOPY W/ ROTATOR CUFF REPAIR Right     Social History   Socioeconomic History  . Marital status: Married    Spouse name: [name]  . Number of children: 3  . Years of education: HS  . Highest education level: Not on file  Social Needs  . Financial resource strain: Not on file  . Food insecurity - worry: Not on file  . Food insecurity - inability: Not on file  . Transportation needs - medical: Not on  file  . Transportation needs - non-medical: Not on file  Occupational History  . Occupation: disabled  Tobacco Use  . Smoking status: Former Smoker    Packs/day: 2.00    Years: 20.00    Pack years: 40.00    Types: Cigarettes    Last attempt to quit: 10/16/1983    Years since quitting: 33.8  . Smokeless tobacco: Never Used  Substance and Sexual Activity  . Alcohol use: No    Alcohol/week: 0.0 oz  . Drug use: No  . Sexual activity: No  Other Topics Concern  . Not on file  Social History Narrative   Pt lives at home with spouse.   Caffeine Use: 1 cup daily   Family History  Problem Relation Age of Onset  . Stroke Mother   . Pneumonia Father   . Diabetes Sister   . Diabetes Brother   . Diabetes Paternal Aunt   . Colon cancer Neg Hx       VITAL SIGNS BP (!) 148/62   Pulse 68   Ht 5\' 7"  (1.702 m)   Wt 187 lb 9.6 oz (85.1 kg)   BMI 29.38 kg/m      Medication List  Accurate as of 08/19/17  1:28 PM. Always use your most recent med list.          amLODipine 5 MG tablet Commonly known as:  NORVASC   carvedilol 12.5 MG tablet Commonly known as:  COREG Take 1 tablet (12.5 mg total) by mouth 2 (two) times daily.   clopidogrel 75 MG tablet Commonly known as:  PLAVIX   diclofenac sodium 1 % Gel Commonly known as:  VOLTAREN   DOCUSATE SODIUM PO   donepezil 10 MG tablet Commonly known as:  ARICEPT Take 1 tablet (10 mg total) by mouth at bedtime.   hydrALAZINE 50 MG tablet Commonly known as:  APRESOLINE   insulin aspart 100 UNIT/ML injection Commonly known as:  novoLOG   insulin glargine 100 UNIT/ML injection Commonly known as:  LANTUS   lisinopril 20 MG tablet Commonly known as:  PRINIVIL,ZESTRIL   memantine 28 MG Cp24 24 hr capsule Commonly known as:  NAMENDA XR Take 1 capsule (28 mg total) by mouth daily.   mirabegron ER 50 MG Tb24 tablet Commonly known as:  MYRBETRIQ   nitroGLYCERIN 0.4 MG SL tablet Commonly known as:  NITROSTAT     phenazopyridine 100 MG tablet Commonly known as:  PYRIDIUM   polyethylene glycol packet Commonly known as:  MIRALAX / GLYCOLAX   prednisoLONE acetate 1 % ophthalmic suspension Commonly known as:  PRED FORTE   senna 8.6 MG tablet Commonly known as:  SENOKOT   sertraline 25 MG tablet Commonly known as:  ZOLOFT Take 1 tablet (25 mg total) by mouth daily.   simvastatin 20 MG tablet Commonly known as:  ZOCOR Take 1 tablet (20 mg total) by mouth at bedtime.   SYSTANE 0.4-0.3 % Soln Generic drug:  Polyethyl Glycol-Propyl Glycol   tamsulosin 0.4 MG Caps capsule Commonly known as:  FLOMAX   torsemide 10 MG tablet Commonly known as:  DEMADEX   traMADol 50 MG tablet Commonly known as:  ULTRAM        SIGNIFICANT DIAGNOSTIC EXAMS  PREVIOUS  08-28-16: chest x-ray: mild atelectasis left lung bases. No evidence of pneumonia or chf  06-20-17: chest x-ray: no acute cardiopulmonary disease  NO NEW EXAMS     LABS REVIEWED: PREVIOUS    08-16-16: wound culture: mrsa 08-20-16; glucose 205; bun 24.3; creat 1.11; k+ 3.7; na++ 147 08-17-16: glucose 205; bun 16.0; creat 1.09; k+ 3.9; na++ 142 08-28-16: wbc 7.3; hgb 13.1; hct 43.5; mcv 83.2; plt 205; glucose 219; bun 24.1; creat 1.32; k+ 4.1; na++ 142; liver normal albumin 3.6  09-18-16: wbc 11.2; hgb 11.2; hct 36.1; mcv 83.5; plt 180; urine culture: pseudomonas aeruginosa: cipro  09-26-16; wbc 7.5; hgb 11.6; hct 39.7; mcv 82.9; plt 287; glucose 133; bun 13.8; creat 1.0;4; k+ 3.7; na++ 143  01-23-17: wbc 6.4; hgb 12.1; hct 39.8; mcv 82.0; plt 184; glucose 118; bun 17.5; creat 0.87; k+ 2.6; na++ 148; hgb a1c 7.6; uric acid 6.7  04-25-17: hgb a1c 7.4 06-20-17: wbc 10.9; hgb 11.1; hct 35.8; mcv 81.5 ;plt 274; glucose 110; bun 19.4; creat 0.88; k+ 3.8; na++ 141 ca 9.0; urine culture: providencia stuartii: cipro  NO NEW LABS   Review of Systems  Unable to perform ROS: Dementia (unable to fully participate )   Physical Exam  Constitutional: He  appears well-developed and well-nourished. No distress.  Neck: Neck supple. No thyromegaly present.  Cardiovascular: Normal rate, regular rhythm, normal heart sounds and intact distal pulses.  Pulmonary/Chest: Effort normal and breath sounds normal. No respiratory  distress.  Abdominal: Soft. He exhibits no distension. There is no tenderness.  Genitourinary:  Genitourinary Comments: Has suprapubic catheter   Musculoskeletal: He exhibits no edema.  Has left hemiparesis Has mild tenderness to palpation bilateral mid back Negative straight leg raise   Neurological: He is alert.  Skin: Skin is warm and dry. He is not diaphoretic.  Psychiatric: He has a normal mood and affect.    ASSESSMENT/ PLAN:  TODAY:   1. Acute mid back pain: will have PT evaluate and treat as indicated for pain management and will begin robaxin 500 mg every 6 ours for 2 weeks then stop will monitor   MD is aware of resident's narcotic use and is in agreement with current plan of care. We will attempt to wean resident as apropriate   Ok Edwards NP [facility]  Contact [phone] Monday through Friday 8am- 5pm  After hours call [phone]

## 2017-08-21 DIAGNOSIS — I639 Cerebral infarction, unspecified: Secondary | ICD-10-CM | POA: Diagnosis not present

## 2017-08-21 DIAGNOSIS — M6281 Muscle weakness (generalized): Secondary | ICD-10-CM | POA: Diagnosis not present

## 2017-08-21 DIAGNOSIS — R262 Difficulty in walking, not elsewhere classified: Secondary | ICD-10-CM | POA: Diagnosis not present

## 2017-08-26 DIAGNOSIS — B351 Tinea unguium: Secondary | ICD-10-CM | POA: Diagnosis not present

## 2017-08-26 DIAGNOSIS — E1140 Type 2 diabetes mellitus with diabetic neuropathy, unspecified: Secondary | ICD-10-CM | POA: Diagnosis not present

## 2017-08-26 DIAGNOSIS — Z794 Long term (current) use of insulin: Secondary | ICD-10-CM | POA: Diagnosis not present

## 2017-08-29 ENCOUNTER — Non-Acute Institutional Stay (SKILLED_NURSING_FACILITY): Payer: PPO | Admitting: Adult Health

## 2017-08-29 ENCOUNTER — Encounter: Payer: Self-pay | Admitting: Adult Health

## 2017-08-29 DIAGNOSIS — R1030 Lower abdominal pain, unspecified: Secondary | ICD-10-CM

## 2017-08-29 DIAGNOSIS — F0390 Unspecified dementia without behavioral disturbance: Secondary | ICD-10-CM | POA: Diagnosis not present

## 2017-08-29 DIAGNOSIS — F419 Anxiety disorder, unspecified: Secondary | ICD-10-CM | POA: Diagnosis not present

## 2017-08-29 DIAGNOSIS — F329 Major depressive disorder, single episode, unspecified: Secondary | ICD-10-CM | POA: Diagnosis not present

## 2017-08-29 NOTE — Progress Notes (Signed)
Location:   [place] Room Number: 053 A Place of Service:  SNF (31)   CODE STATUS: DNR  Allergies  Allergen Reactions  . Pioglitazone Other (See Comments)    Edema   . Rosiglitazone Maleate Other (See Comments)    edema    Chief Complaint  Patient presents with  . Acute Visit    Abdominal Pain    HPI:  Staff reports that he is complaining of abdominal pain. He is not impacted; no diarrhea; no nausea or vomiting present. There are no reports of fever present. There are no reports of changes in appetite.   Past Medical History:  Diagnosis Date  . Arthritis    "maybe"  . 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 CHF (congestive heart failure) ([place])   . Depression   . 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 Hyperlipemia   . Hypertension   . Sleep apnea    "my machine [place]; got to get me another one" (09/01/2014)  . Stroke [facility]) 2009   left thalamic infarct, residual LUE weakness  . TIA (transient ischemic attack)    multiple in past, most recently in 2011  . Type II diabetes mellitus ([place])     Past Surgical History:  Procedure Laterality Date  . CARDIAC CATHETERIZATION  07/2010  . CRANIOTOMY  05/2008   for clipping of an incidental asymptomatic unruptured/notes 02/13/2011  . KNEE ARTHROSCOPY Left   . 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 SHOULDER ARTHROSCOPY W/ ROTATOR CUFF REPAIR Right     Social History   Socioeconomic History  . Marital status: Married    Spouse name: [name]  . Number of children: 3  . Years of education: HS  . Highest education level: Not on file  Social Needs  . Financial resource strain: Not on file  . Food insecurity - worry: Not on file  . Food insecurity - inability: Not on file  . Transportation needs - medical: Not on file  . Transportation  needs - non-medical: Not on file  Occupational History  . Occupation: disabled  Tobacco Use  . Smoking status: Former Smoker    Packs/day: 2.00    Years: 20.00    Pack years: 40.00    Types: Cigarettes    Last attempt to quit: 10/16/1983    Years since quitting: 33.8  . Smokeless tobacco: Never Used  Substance and Sexual Activity  . Alcohol use: No    Alcohol/week: 0.0 oz  . Drug use: No  . Sexual activity: No  Other Topics Concern  . Not on file  Social History Narrative   Pt lives at home with spouse.   Caffeine Use: 1 cup daily   Family History  Problem Relation Age of Onset  . Stroke Mother   . Pneumonia Father   . Diabetes Sister   . Diabetes Brother   . Diabetes Paternal Aunt   . Colon cancer Neg Hx       VITAL SIGNS BP (!) 148/68   Pulse 71   Temp 98.5 F (36.9 C)   Resp 18   Ht 5\' 7"  (1.702 m)   Wt 187 lb 8 oz (85 kg)   SpO2 94%   BMI 29.37 kg/m      Medication List  Accurate as of 08/29/17 10:45 AM. Always use your most recent med list.          amLODipine 5 MG tablet Commonly known as:  NORVASC   carvedilol 12.5 MG tablet Commonly known as:  COREG Take 1 tablet (12.5 mg total) by mouth 2 (two) times daily.   clopidogrel 75 MG tablet Commonly known as:  PLAVIX   diclofenac sodium 1 % Gel Commonly known as:  VOLTAREN   DOCUSATE SODIUM PO   donepezil 10 MG tablet Commonly known as:  ARICEPT Take 1 tablet (10 mg total) by mouth at bedtime.   hydrALAZINE 50 MG tablet Commonly known as:  APRESOLINE   insulin aspart 100 UNIT/ML injection Commonly known as:  novoLOG   insulin glargine 100 UNIT/ML injection Commonly known as:  LANTUS   lisinopril 20 MG tablet Commonly known as:  PRINIVIL,ZESTRIL   memantine 28 MG Cp24 24 hr capsule Commonly known as:  NAMENDA XR Take 1 capsule (28 mg total) by mouth daily.   mirabegron ER 50 MG Tb24 tablet Commonly known as:  MYRBETRIQ   nitroGLYCERIN 0.4 MG SL tablet Commonly known  as:  NITROSTAT   phenazopyridine 100 MG tablet Commonly known as:  PYRIDIUM   polyethylene glycol packet Commonly known as:  MIRALAX / GLYCOLAX   prednisoLONE acetate 1 % ophthalmic suspension Commonly known as:  PRED FORTE   senna 8.6 MG tablet Commonly known as:  SENOKOT   sertraline 25 MG tablet Commonly known as:  ZOLOFT Take 1 tablet (25 mg total) by mouth daily.   simvastatin 20 MG tablet Commonly known as:  ZOCOR Take 1 tablet (20 mg total) by mouth at bedtime.   SYSTANE 0.4-0.3 % Soln Generic drug:  Polyethyl Glycol-Propyl Glycol   tamsulosin 0.4 MG Caps capsule Commonly known as:  FLOMAX   torsemide 10 MG tablet Commonly known as:  DEMADEX   traMADol 50 MG tablet Commonly known as:  ULTRAM        SIGNIFICANT DIAGNOSTIC EXAMS   PREVIOUS  08-28-16: chest x-ray: mild atelectasis left lung bases. No evidence of pneumonia or chf  06-20-17: chest x-ray: no acute cardiopulmonary disease  NO NEW EXAMS     LABS REVIEWED: PREVIOUS    08-16-16: wound culture: mrsa 08-20-16; glucose 205; bun 24.3; creat 1.11; k+ 3.7; na++ 147 08-17-16: glucose 205; bun 16.0; creat 1.09; k+ 3.9; na++ 142 08-28-16: wbc 7.3; hgb 13.1; hct 43.5; mcv 83.2; plt 205; glucose 219; bun 24.1; creat 1.32; k+ 4.1; na++ 142; liver normal albumin 3.6  09-18-16: wbc 11.2; hgb 11.2; hct 36.1; mcv 83.5; plt 180; urine culture: pseudomonas aeruginosa: cipro  09-26-16; wbc 7.5; hgb 11.6; hct 39.7; mcv 82.9; plt 287; glucose 133; bun 13.8; creat 1.0;4; k+ 3.7; na++ 143  01-23-17: wbc 6.4; hgb 12.1; hct 39.8; mcv 82.0; plt 184; glucose 118; bun 17.5; creat 0.87; k+ 2.6; na++ 148; hgb a1c 7.6; uric acid 6.7  04-25-17: hgb a1c 7.4 06-20-17: wbc 10.9; hgb 11.1; hct 35.8; mcv 81.5 ;plt 274; glucose 110; bun 19.4; creat 0.88; k+ 3.8; na++ 141 ca 9.0; urine culture: providencia stuartii: cipro  NO NEW LABS   Review of Systems  Reason unable to perform ROS: poor historian   Gastrointestinal: Positive for  abdominal pain. Negative for constipation, nausea and vomiting.   Physical Exam  Constitutional: He appears well-developed and well-nourished. No distress.  Neck: Neck supple. No thyromegaly present.  Cardiovascular: Normal rate, regular rhythm, normal heart sounds and intact distal pulses.  Pulmonary/Chest: Effort normal and breath sounds normal. No respiratory distress.  Abdominal: Soft. Bowel sounds are normal. He exhibits no distension. There is tenderness.  Has pain to palpation at the suprapubic region   Genitourinary:  Genitourinary Comments: Has suprapubic foley urine is dark   Musculoskeletal: He exhibits no edema.  Left hemiparesis   Lymphadenopathy:    He has no cervical adenopathy.  Skin: Skin is warm and dry. He is not diaphoretic.  Psychiatric: He has a normal mood and affect.    ASSESSMENT/ PLAN:  TODAY:   1. Abdominal pain: due to his tenderness and pain: will get ua/c&s and will treat as indicated  MD is aware of resident's narcotic use and is in agreement with current plan of care. We will attempt to wean resident as apropriate   Ok Edwards NP [facility]  Contact [phone] Monday through Friday 8am- 5pm  After hours call [phone]

## 2017-08-30 DIAGNOSIS — R319 Hematuria, unspecified: Secondary | ICD-10-CM | POA: Diagnosis not present

## 2017-08-30 DIAGNOSIS — I10 Essential (primary) hypertension: Secondary | ICD-10-CM | POA: Diagnosis not present

## 2017-08-30 DIAGNOSIS — Z79899 Other long term (current) drug therapy: Secondary | ICD-10-CM | POA: Diagnosis not present

## 2017-08-30 DIAGNOSIS — N390 Urinary tract infection, site not specified: Secondary | ICD-10-CM | POA: Diagnosis not present

## 2017-09-06 DIAGNOSIS — M549 Dorsalgia, unspecified: Secondary | ICD-10-CM | POA: Insufficient documentation

## 2017-09-09 ENCOUNTER — Encounter: Payer: Self-pay | Admitting: Internal Medicine

## 2017-09-09 ENCOUNTER — Non-Acute Institutional Stay (SKILLED_NURSING_FACILITY): Payer: PPO | Admitting: Internal Medicine

## 2017-09-09 DIAGNOSIS — F0150 Vascular dementia without behavioral disturbance: Secondary | ICD-10-CM

## 2017-09-09 DIAGNOSIS — I10 Essential (primary) hypertension: Secondary | ICD-10-CM

## 2017-09-09 DIAGNOSIS — R339 Retention of urine, unspecified: Secondary | ICD-10-CM

## 2017-09-09 DIAGNOSIS — N390 Urinary tract infection, site not specified: Secondary | ICD-10-CM

## 2017-09-09 DIAGNOSIS — I6930 Unspecified sequelae of cerebral infarction: Secondary | ICD-10-CM | POA: Diagnosis not present

## 2017-09-09 DIAGNOSIS — T83511D Infection and inflammatory reaction due to indwelling urethral catheter, subsequent encounter: Secondary | ICD-10-CM | POA: Diagnosis not present

## 2017-09-09 DIAGNOSIS — E1149 Type 2 diabetes mellitus with other diabetic neurological complication: Secondary | ICD-10-CM

## 2017-09-09 NOTE — Progress Notes (Signed)
DATE:  September 09, 2017  Location:   [facility] Room Number: 55 A Place of Service: SNF (31)   Extended Emergency Contact Information Primary Emergency Contact: Dyment,[name] Address: [address]          [place] [address] [place] Phone: [phone] Relation: Spouse Secondary Emergency Contact: Gal,[name]  [place] Relation: Daughter  Warden/ranger Does Patient Have a Catering manager?: Yes, Would patient like information on creating a medical advance directive?: No - Patient declined, Type of Advance Directive: Out of facility DNR (pink MOST or yellow form), Pre-existing out of facility DNR order (yellow form or pink MOST form): Pink MOST form placed in chart (order not valid for inpatient use);Yellow form placed in chart (order not valid for inpatient use), Does patient want to make changes to medical advance directive?: No - Patient declined  Chief Complaint  Patient presents with  . Medical Management of Chronic Issues    1 month follow up    HPI:  76 yo male long term resident seen today for f/u. He has no concerns today. No recent nursing issues. He completed Cipro for ESBL UTI on 09/08/17. He is on contact isolation. He is a poor historian due to dementia. 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 Stable on plavix 75 mg daily  Hypertension - stable on coreg 12.5 mg twice daily; lisinopril 40 mg daily; norvasc 5 mg daily; hydralazine 75 mg four times  daily   DM - A1c 7.4%. CBGs 150-330s. He takes Novolog SSI; lantus 12 units nightly   Vascular dementia - stable. He takes aricept 10 mg nightly and namdena xr 28 mg daily.  Weight down 4 lbs since Sept 2018. He failed remeron trial  Dyslipidemia - stable on zocor 20 mg daily  Urine retention - unchanged. He has chronic foley and is followed by urology at [facility]. Takes flomax 0.4 mg daily and myrbetriq 50 mg daily     Adjustment disorder with depression - mood stable on zoloft 25 mg daily   Lower extremity edema - stable on demadex 10 mg daily   Osteoarthritis bilateral knees - stable on voltaren gel 4 gm to both knees four times daily   Past Medical History:  Diagnosis Date  . Arthritis    "maybe"  . 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 CHF (congestive heart failure) ([place])   . Depression   . 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 Hyperlipemia   . Hypertension   . Sleep apnea    "my machine [place]; got to get me another one" (09/01/2014)  . Stroke [facility]) 2009   left thalamic infarct, residual LUE weakness  . TIA (transient ischemic attack)    multiple in past, most recently in 2011  . Type II diabetes mellitus ([place])     Past Surgical History:  Procedure Laterality Date  . CARDIAC CATHETERIZATION  07/2010  . CRANIOTOMY  05/2008   for clipping of an incidental asymptomatic unruptured/notes 02/13/2011  . KNEE ARTHROSCOPY Left   . 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 SHOULDER ARTHROSCOPY W/ ROTATOR CUFF REPAIR Right     Patient Care Team: Gildardo Cranker, DO as PCP - General (Internal Medicine) Webb Laws,  OD as Referring Physician (Optometry) MacDiarmid, Nicki Reaper, MD as Consulting Physician (Urology) Hayden Pedro, MD as Consulting Physician (Ophthalmology) Borum, Jaci Standard, MD as Referring Physician (Internal Medicine) Gerlene Fee, NP as Nurse Practitioner ([place] [place] [facility])  Social History   Socioeconomic History  . Marital status: Married    Spouse name: [name]  . Number of children: 3  . Years of education: HS  . Highest education level: Not on file  Social Needs  . Financial resource strain: Not on file  . Food insecurity - worry:  Not on file  . Food insecurity - inability: Not on file  . Transportation needs - medical: Not on file  . Transportation needs - non-medical: Not on file  Occupational History  . Occupation: disabled  Tobacco Use  . Smoking status: Former Smoker    Packs/day: 2.00    Years: 20.00    Pack years: 40.00    Types: Cigarettes    Last attempt to quit: 10/16/1983    Years since quitting: 33.9  . Smokeless tobacco: Never Used  Substance and Sexual Activity  . Alcohol use: No    Alcohol/week: 0.0 oz  . Drug use: No  . Sexual activity: No  Other Topics Concern  . Not on file  Social History Narrative   Pt lives at home with spouse.   Caffeine Use: 1 cup daily     reports that he quit smoking about 33 years ago. His smoking use included cigarettes. He has a 40.00 pack-year smoking history. he has never used smokeless tobacco. He reports that he does not drink alcohol or use drugs.  Family History  Problem Relation Age of Onset  . Stroke Mother   . Pneumonia Father   . Diabetes Sister   . Diabetes Brother   . Diabetes Paternal Aunt   . Colon cancer Neg Hx    Family Status  Relation Name Status  . Mother  Deceased at age 49  . Father  Deceased at age 58  . Sister  (Not Specified)  . Brother  (Not Specified)  . Ethlyn Daniels  (Not Specified)  . Neg Hx  (Not Specified)    Immunization History  Administered Date(s) Administered  . Influenza Split 06/25/2012  . Influenza Whole 07/18/2007, 08/18/2008, 08/09/2009, 06/14/2010, 06/20/2011  . Influenza,inj,Quad PF,6+ Mos 06/24/2013, 06/08/2014  . Influenza-Unspecified 08/20/2016, 07/30/2017  . PPD Test 04/25/2016, 06/29/2016, 07/06/2016  . Pneumococcal Conjugate-13 01/06/2015  . Pneumococcal Polysaccharide-23 05/30/2008  . Tdap 06/20/2011    Allergies  Allergen Reactions  . Pioglitazone Other (See Comments)    Edema   . Rosiglitazone Maleate Other (See Comments)    edema    Medications:   Medication List        Accurate  as of 09/09/17  4:28 PM. Always use your most recent med list.          amLODipine 5 MG tablet Commonly known as:  NORVASC   carvedilol 12.5 MG tablet Commonly known as:  COREG Take 1 tablet (12.5 mg total) by mouth 2 (two) times daily.   clopidogrel 75 MG tablet Commonly known as:  PLAVIX   diclofenac sodium 1 % Gel Commonly known as:  VOLTAREN   DOCUSATE SODIUM PO   donepezil 10 MG tablet Commonly known as:  ARICEPT Take 1 tablet (10 mg total) by mouth at bedtime.   hydrALAZINE 50 MG tablet Commonly known as:  APRESOLINE   insulin aspart 100 UNIT/ML injection Commonly  known as:  novoLOG   insulin glargine 100 UNIT/ML injection Commonly known as:  LANTUS   lisinopril 20 MG tablet Commonly known as:  PRINIVIL,ZESTRIL   memantine 28 MG Cp24 24 hr capsule Commonly known as:  NAMENDA XR Take 1 capsule (28 mg total) by mouth daily.   mirabegron ER 50 MG Tb24 tablet Commonly known as:  MYRBETRIQ   nitroGLYCERIN 0.4 MG SL tablet Commonly known as:  NITROSTAT   phenazopyridine 100 MG tablet Commonly known as:  PYRIDIUM   polyethylene glycol packet Commonly known as:  MIRALAX / GLYCOLAX   prednisoLONE acetate 1 % ophthalmic suspension Commonly known as:  PRED FORTE   saccharomyces boulardii 250 MG capsule Commonly known as:  FLORASTOR   senna 8.6 MG tablet Commonly known as:  SENOKOT   sertraline 25 MG tablet Commonly known as:  ZOLOFT Take 1 tablet (25 mg total) by mouth daily.   simvastatin 20 MG tablet Commonly known as:  ZOCOR Take 1 tablet (20 mg total) by mouth at bedtime.   SYSTANE 0.4-0.3 % Soln Generic drug:  Polyethyl Glycol-Propyl Glycol   tamsulosin 0.4 MG Caps capsule Commonly known as:  FLOMAX   torsemide 10 MG tablet Commonly known as:  DEMADEX   traMADol 50 MG tablet Commonly known as:  ULTRAM       Review of Systems  Unable to perform ROS: Dementia    Vitals:   09/09/17 1052  BP: 128/84  Pulse: 66  Weight: 187 lb 8  oz (85 kg)  Height: 5\' 7"  (1.702 m)   Body mass index is 29.37 kg/m.  Physical Exam  Constitutional: He appears well-developed and well-nourished.  HENT:  Mouth/Throat: Oropharynx is clear and moist.  MMM; no oral thrush  Eyes: Pupils are equal, round, and reactive to light. No scleral icterus.  Neck: Neck supple. Carotid bruit is not present. No thyromegaly present.  Cardiovascular: Normal rate, regular rhythm and intact distal pulses. Exam reveals no gallop and no friction rub.  Murmur (1/6 SEM) heard. No distal LE edema. No calf TTP  Pulmonary/Chest: Effort normal and breath sounds normal. No respiratory distress. He has no wheezes. He has no rales. He exhibits no tenderness.  Abdominal: Soft. Normal appearance and bowel sounds are normal. He exhibits no distension, no abdominal bruit, no pulsatile midline mass and no mass. There is no hepatomegaly. There is no tenderness. There is no rigidity, no rebound and no guarding. No hernia.  obese  Genitourinary:  Genitourinary Comments: Foley cath intact and DTG clear yellow urine  Musculoskeletal: He exhibits edema.  Lymphadenopathy:    He has no cervical adenopathy.  Neurological: He is alert.  Skin: Skin is warm and dry. No rash noted.  No foot lesions noted  Psychiatric: He has a normal mood and affect. His behavior is normal. Thought content normal.     Labs reviewed: Nursing Home on 07/31/2017  Component Date Value Ref Range Status  . Hemoglobin 08/01/2017 11.5* 13.5 - 17.5 Final  . HCT 08/01/2017 37* 41 - 53 Final  . Neutrophils Absolute 08/01/2017 5   Final  . Platelets 08/01/2017 280  150 - 399 Final  . WBC 08/01/2017 7.6   Final  . Glucose 08/01/2017 137   Final  . BUN 08/01/2017 21  4 - 21 Final  . Creatinine 08/01/2017 1.0  0.6 - 1.3 Final  . Potassium 08/01/2017 3.7  3.4 - 5.3 Final  . Sodium 08/01/2017 148* 137 - 147 Final  . Alkaline Phosphatase 08/01/2017  100  25 - 125 Final  . ALT 08/01/2017 15  10 - 40 Final   . AST 08/01/2017 11* 14 - 40 Final  . Bilirubin, Total 08/01/2017 0.4   Final  . TSH 08/01/2017 1.50  0.41 - 5.90 Final  Abstract on 06/21/2017  Component Date Value Ref Range Status  . Hemoglobin 06/20/2017 11.1* 13.5 - 17.5 Final  . HCT 06/20/2017 36* 41 - 53 Final  . Neutrophils Absolute 06/20/2017 8   Final  . Platelets 06/20/2017 274  150 - 399 Final  . WBC 06/20/2017 10.9   Final  . Glucose 06/20/2017 110   Final  . BUN 06/20/2017 19  4 - 21 Final  . Creatinine 06/20/2017 0.9  0.6 - 1.3 Final  . Potassium 06/20/2017 3.8  3.4 - 5.3 Final  . Sodium 06/20/2017 141  137 - 147 Final    No results found.   Assessment/Plan   ICD-10-CM   1. Urinary tract infection associated with indwelling urethral catheter, subsequent encounter T83.511D    N39.0    resolved  2. Vascular dementia without behavioral disturbance F01.50   3. Urinary retention R33.9   4. Type II diabetes mellitus with neurological manifestations ([place]) E11.49   5. Essential hypertension I10   6. History of CVA with residual deficit I69.30     D/c contact isolation as he completed abx  Cont current meds as ordered  Pt/OT/ST as indicated  [facility]  Foley cath care as indicated  Will need lipid panel next month  Will follow  [place] S. Perlie Gold  [facility] [address] [place], [place] [address] [phone] Cell (Monday-Friday 8 AM - 5 PM) [phone] After 5 PM and follow prompts

## 2017-09-11 ENCOUNTER — Other Ambulatory Visit: Payer: Self-pay

## 2017-09-11 MED ORDER — TRAMADOL HCL 50 MG PO TABS: 50.0000 mg | ORAL_TABLET | Freq: Four times a day (QID) | ORAL | 0 refills | Status: DC | PRN

## 2017-09-11 NOTE — Telephone Encounter (Signed)
RX faxed to [facility] @ [phone], phone number [phone] 

## 2017-09-13 ENCOUNTER — Encounter: Payer: Self-pay | Admitting: Internal Medicine

## 2017-09-16 DIAGNOSIS — M6281 Muscle weakness (generalized): Secondary | ICD-10-CM | POA: Diagnosis not present

## 2017-09-16 DIAGNOSIS — R262 Difficulty in walking, not elsewhere classified: Secondary | ICD-10-CM | POA: Diagnosis not present

## 2017-09-16 DIAGNOSIS — I639 Cerebral infarction, unspecified: Secondary | ICD-10-CM | POA: Diagnosis not present

## 2017-09-17 ENCOUNTER — Encounter: Payer: Self-pay | Admitting: Gastroenterology

## 2017-10-04 ENCOUNTER — Encounter: Payer: Self-pay | Admitting: Adult Health

## 2017-10-04 ENCOUNTER — Non-Acute Institutional Stay (SKILLED_NURSING_FACILITY): Payer: PPO | Admitting: Adult Health

## 2017-10-04 DIAGNOSIS — F4323 Adjustment disorder with mixed anxiety and depressed mood: Secondary | ICD-10-CM

## 2017-10-04 DIAGNOSIS — N319 Neuromuscular dysfunction of bladder, unspecified: Secondary | ICD-10-CM

## 2017-10-04 DIAGNOSIS — R609 Edema, unspecified: Secondary | ICD-10-CM

## 2017-10-04 DIAGNOSIS — T83010S Breakdown (mechanical) of cystostomy catheter, sequela: Secondary | ICD-10-CM | POA: Diagnosis not present

## 2017-10-04 DIAGNOSIS — M170 Bilateral primary osteoarthritis of knee: Secondary | ICD-10-CM | POA: Diagnosis not present

## 2017-10-04 NOTE — Progress Notes (Signed)
Location:   [facility] Room Number: 536 A Place of Service:  SNF (31)   CODE STATUS: DNR  Allergies  Allergen Reactions  . Pioglitazone Other (See Comments)    Edema   . Rosiglitazone Maleate Other (See Comments)    edema    Chief Complaint  Patient presents with  . Medical Management of Chronic Issues    Neurogenic bladder; depression; lower extremity edema; osteoarthritis     HPI:  He is a 76 year old long term resident of this facility being seen for the management of his chronic illnesses: bilateral primary osteoarthritis of knee; neurogenic bladder; adjustment disorder with mixed anxiety and depression mood; dependent edema bilateral legs. He is unable to fully participate in the hpi or ros. There are reports of changes in behavior; appetite or knee pain. There are no nursing concerns at this time.   Past Medical History:  Diagnosis Date  . Arthritis    "maybe"  . 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 CHF (congestive heart failure) ([place])   . Depression   . 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 Hyperlipemia   . Hypertension   . Sleep apnea    "my machine [place]; got to get me another one" (09/01/2014)  . Stroke [facility]) 2009   left thalamic infarct, residual LUE weakness  . TIA (transient ischemic attack)    multiple in past, most recently in 2011  . Type II diabetes mellitus ([place])     Past Surgical History:  Procedure Laterality Date  . CARDIAC CATHETERIZATION  07/2010  . CRANIOTOMY  05/2008   for clipping of an incidental asymptomatic unruptured/notes 02/13/2011  . KNEE ARTHROSCOPY Left   . 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 SHOULDER ARTHROSCOPY W/ ROTATOR CUFF REPAIR Right     Social History   Socioeconomic History  . Marital status: Married    Spouse name: [name]    . Number of children: 3  . Years of education: HS  . Highest education level: Not on file  Social Needs  . Financial resource strain: Not on file  . Food insecurity - worry: Not on file  . Food insecurity - inability: Not on file  . Transportation needs - medical: Not on file  . Transportation needs - non-medical: Not on file  Occupational History  . Occupation: disabled  Tobacco Use  . Smoking status: Former Smoker    Packs/day: 2.00    Years: 20.00    Pack years: 40.00    Types: Cigarettes    Last attempt to quit: 10/16/1983    Years since quitting: 33.9  . Smokeless tobacco: Never Used  Substance and Sexual Activity  . Alcohol use: No    Alcohol/week: 0.0 oz  . Drug use: No  . Sexual activity: No  Other Topics Concern  . Not on file  Social History Narrative  . Not on file   Family History  Problem Relation Age of Onset  . Stroke Mother   . Pneumonia Father   . Diabetes Sister   . Diabetes Brother   . Diabetes Paternal Aunt   . Colon cancer Neg Hx       VITAL SIGNS BP (!) 152/84   Pulse 86   Temp 98.5 F (36.9 C)   Resp 18  Ht 5\' 7"  (1.702 m)   Wt 190 lb 1.6 oz (86.2 kg)   SpO2 94%   BMI 29.77 kg/m   Outpatient Encounter Medications as of 10/04/2017  Medication Sig  . amLODipine (NORVASC) 5 MG tablet Take 5 mg by mouth every morning.   . carvedilol (COREG) 12.5 MG tablet Take 1 tablet (12.5 mg total) by mouth 2 (two) times daily.  . clopidogrel (PLAVIX) 75 MG tablet Take 75 mg by mouth at bedtime.  . diclofenac sodium (VOLTAREN) 1 % GEL Apply 4 g topically 4 (four) times daily. Apply to Bilateral Knees  . DOCUSATE SODIUM PO Take 1 capsule by mouth every 12 (twelve) hours as needed.  . donepezil (ARICEPT) 10 MG tablet Take 1 tablet (10 mg total) by mouth at bedtime.  . hydrALAZINE (APRESOLINE) 50 MG tablet Take 75 mg by mouth 4 (four) times daily.   . insulin aspart (NOVOLOG) 100 UNIT/ML injection Inject 0-5 Units into the skin 3 (three) times daily  before meals. Inject as per sliding scale: 0-59=0 call MD if less than 60.  60-150=0; 151-300 = 3 units 301-400=5 units call MD if >400  . insulin glargine (LANTUS) 100 UNIT/ML injection Inject 12 Units into the skin daily.   Marland Kitchen lisinopril (PRINIVIL,ZESTRIL) 20 MG tablet Give 2 tablets (40 mg) by mouth daily  . memantine (NAMENDA XR) 28 MG CP24 24 hr capsule Take 1 capsule (28 mg total) by mouth daily.  . mirabegron ER (MYRBETRIQ) 50 MG TB24 tablet Take 50 mg by mouth daily.  . nitroGLYCERIN (NITROSTAT) 0.4 MG SL tablet Place 0.4 mg under the tongue every 5 (five) minutes as needed for chest pain.  . phenazopyridine (PYRIDIUM) 100 MG tablet Take 100 mg by mouth 3 (three) times daily as needed for pain.  Vladimir Faster Glycol-Propyl Glycol (SYSTANE) 0.4-0.3 % SOLN Apply 1 drop to eye 2 (two) times daily. Both eyes  . polyethylene glycol (MIRALAX / GLYCOLAX) packet Take 17 g by mouth daily.  . prednisoLONE acetate (PRED FORTE) 1 % ophthalmic suspension Place 1 drop into the left eye 4 (four) times daily.  Marland Kitchen senna (SENOKOT) 8.6 MG tablet Take 1 tablet by mouth every 12 (twelve) hours as needed for constipation.  . sertraline (ZOLOFT) 25 MG tablet Take 1 tablet (25 mg total) by mouth daily.  . simvastatin (ZOCOR) 20 MG tablet Take 1 tablet (20 mg total) by mouth at bedtime.  . tamsulosin (FLOMAX) 0.4 MG CAPS capsule Take 0.4 mg by mouth at bedtime.  . torsemide (DEMADEX) 10 MG tablet Take 10 mg by mouth daily.  . traMADol (ULTRAM) 50 MG tablet Take 1 tablet (50 mg total) by mouth every 6 (six) hours as needed for moderate pain or severe pain.   No facility-administered encounter medications on file as of 10/04/2017.      SIGNIFICANT DIAGNOSTIC EXAMS  PREVIOUS  08-28-16: chest x-ray: mild atelectasis left lung bases. No evidence of pneumonia or chf  06-20-17: chest x-ray: no acute cardiopulmonary disease  NO NEW EXAMS     LABS REVIEWED: PREVIOUS    09-18-16: wbc 11.2; hgb 11.2; hct 36.1; mcv  83.5; plt 180; urine culture: pseudomonas aeruginosa: cipro  09-26-16; wbc 7.5; hgb 11.6; hct 39.7; mcv 82.9; plt 287; glucose 133; bun 13.8; creat 1.0;4; k+ 3.7; na++ 143  01-23-17: wbc 6.4; hgb 12.1; hct 39.8; mcv 82.0; plt 184; glucose 118; bun 17.5; creat 0.87; k+ 2.6; na++ 148; hgb a1c 7.6; uric acid 6.7  04-25-17: hgb a1c 7.4 05-24-17:  urine micro-albumin 34.1 06-20-17: wbc 10.9; hgb 11.1; hct 35.8; mcv 81.5 ;plt 274; glucose 110; bun 19.4; creat 0.88; k+ 3.8; na++ 141 ca 9.0; urine culture: providencia stuartii: cipro  TODAY:   08-01-17: wbc 7.6; hgb 11.5; hct 37.2; mcv 80.7; plt 280; glucose 137; bun 21.2; creat 1.00 ;k+ 3.7; na++ 148; ca 9.4; liver normal albumin 3.4 08-30-17: urine culture: klebsiella pneumoniae: ESBL: cipro  Review of Systems  Unable to perform ROS: Dementia (poor historian)  Respiratory: Negative for cough.   Cardiovascular: Negative for chest pain and palpitations.  Genitourinary:       Has s/p foley   Musculoskeletal: Negative for myalgias.  Neurological: Negative for weakness and headaches.  Psychiatric/Behavioral: The patient is not nervous/anxious.    Physical Exam  Constitutional: He appears well-developed and well-nourished. No distress.  Neck: No thyromegaly present.  Cardiovascular: Normal rate, regular rhythm and intact distal pulses.  Murmur heard. 1/6  Pulmonary/Chest: Effort normal and breath sounds normal. No respiratory distress.  Abdominal: Soft. Bowel sounds are normal. He exhibits no distension. There is no tenderness.  Genitourinary:  Genitourinary Comments: Suprapubic foley   Musculoskeletal: He exhibits no edema.  Left hemiparesis   Lymphadenopathy:    He has no cervical adenopathy.  Neurological: He is alert.  Skin: Skin is warm and dry. He is not diaphoretic.  Psychiatric: He has a normal mood and affect.   ASSESSMENT/ PLAN:  TODAY:   1. Neurogenic bladder: is without change: has s/p foley is followed by [facility]rology at [facility]. Will  continue flomax 0.4 mg daily and myrbetriq 50 mg daily   2. Adjustment disorder: with depression: is stable  will continue zoloft 25 mg daily   3. Lower extremity edema: is stable  will continue demadex 10 mg daily   4. Osteoarthritis bilateral knees: stable will continue voltaren gel 4 gm to both knees four times daily  PREVIOUS   5. Constipation: stable; will continue miralax daily and colace and senna twice daily as needed   6. CVA: left hemiparesis: is neurologically stable; will continue plavix 75 mg daily  7. Hypertension: stable b/p  152/84  will continue coreg 12.5 mg twice daily lisinopril 40 mg daily norvasc 5 mg daily   hydralazine  75 mg four times  daily   8. Diabetes: stable hgb a1c 7.4 will continue novolog SSI: 60-150=3units; 151-400=5 units lantus 12 units nightly   9. Vascular dementia: no significant change in status will continue aricept 10 mg nightly and namdena xr 28 mg daily   Weight is 190 pounds ( he did respond to remeron)  10.  Dyslipidemia:stable  will continue zocor 20 mg daily    MD is aware of resident's narcotic use and is in agreement with current plan of care. We will attempt to wean resident as apropriate     Ok Edwards NP [facility]  Contact [phone] Monday through Friday 8am- 5pm  After hours call [phone]

## 2017-10-10 DIAGNOSIS — E1149 Type 2 diabetes mellitus with other diabetic neurological complication: Secondary | ICD-10-CM | POA: Diagnosis not present

## 2017-10-11 DIAGNOSIS — E119 Type 2 diabetes mellitus without complications: Secondary | ICD-10-CM | POA: Diagnosis not present

## 2017-10-11 LAB — HEMOGLOBIN A1C: HEMOGLOBIN A1C: 7.7

## 2017-11-07 LAB — BASIC METABOLIC PANEL
BUN: 18 (ref 4–21)
CREATININE: 0.9 (ref 0.6–1.3)
Glucose: 180
Potassium: 3.9 (ref 3.4–5.3)
Sodium: 146 (ref 137–147)

## 2017-11-07 LAB — HEPATIC FUNCTION PANEL
ALK PHOS: 84 (ref 25–125)
ALT: 13 (ref 10–40)
AST: 13 — AB (ref 14–40)
Bilirubin, Total: 0.6

## 2017-11-07 LAB — CBC AND DIFFERENTIAL
HEMATOCRIT: 41 (ref 41–53)
HEMOGLOBIN: 12.4 — AB (ref 13.5–17.5)
NEUTROS ABS: 3
PLATELETS: 189 (ref 150–399)
WBC: 5.5

## 2017-11-07 LAB — LIPID PANEL
CHOLESTEROL: 106 (ref 0–200)
HDL: 30 — AB (ref 35–70)
LDL Cholesterol: 55
TRIGLYCERIDES: 107 (ref 40–160)

## 2017-11-13 ENCOUNTER — Encounter (INDEPENDENT_AMBULATORY_CARE_PROVIDER_SITE_OTHER): Payer: Medicare Other | Admitting: Ophthalmology

## 2017-11-13 DIAGNOSIS — H59032 Cystoid macular edema following cataract surgery, left eye: Secondary | ICD-10-CM | POA: Diagnosis not present

## 2017-11-13 DIAGNOSIS — I10 Essential (primary) hypertension: Secondary | ICD-10-CM | POA: Diagnosis not present

## 2017-11-13 DIAGNOSIS — H43813 Vitreous degeneration, bilateral: Secondary | ICD-10-CM

## 2017-11-13 DIAGNOSIS — H35033 Hypertensive retinopathy, bilateral: Secondary | ICD-10-CM

## 2017-11-13 DIAGNOSIS — E113212 Type 2 diabetes mellitus with mild nonproliferative diabetic retinopathy with macular edema, left eye: Secondary | ICD-10-CM

## 2017-11-13 DIAGNOSIS — E11311 Type 2 diabetes mellitus with unspecified diabetic retinopathy with macular edema: Secondary | ICD-10-CM | POA: Diagnosis not present

## 2017-11-13 DIAGNOSIS — E113391 Type 2 diabetes mellitus with moderate nonproliferative diabetic retinopathy without macular edema, right eye: Secondary | ICD-10-CM

## 2017-11-13 DIAGNOSIS — H35372 Puckering of macula, left eye: Secondary | ICD-10-CM | POA: Diagnosis not present

## 2017-12-09 ENCOUNTER — Non-Acute Institutional Stay (SKILLED_NURSING_FACILITY): Payer: Medicare Other | Admitting: Internal Medicine

## 2017-12-09 ENCOUNTER — Encounter: Payer: Self-pay | Admitting: Internal Medicine

## 2017-12-09 DIAGNOSIS — N319 Neuromuscular dysfunction of bladder, unspecified: Secondary | ICD-10-CM | POA: Diagnosis not present

## 2017-12-09 DIAGNOSIS — E785 Hyperlipidemia, unspecified: Secondary | ICD-10-CM | POA: Diagnosis not present

## 2017-12-09 DIAGNOSIS — E1149 Type 2 diabetes mellitus with other diabetic neurological complication: Secondary | ICD-10-CM | POA: Diagnosis not present

## 2017-12-09 DIAGNOSIS — F0150 Vascular dementia without behavioral disturbance: Secondary | ICD-10-CM

## 2017-12-09 DIAGNOSIS — E1169 Type 2 diabetes mellitus with other specified complication: Secondary | ICD-10-CM | POA: Diagnosis not present

## 2017-12-09 DIAGNOSIS — I6930 Unspecified sequelae of cerebral infarction: Secondary | ICD-10-CM

## 2017-12-09 NOTE — Progress Notes (Signed)
Patient ID: [name], male   DOB: 05-29-1941, 77 y.o.   MRN: [id number]  Location:  [facility]   Place of Service:  SNF (31) Provider:  Toccopola, [place], DO  Patient Care Team: Gildardo Cranker, DO as PCP - General (Internal Medicine) Webb Laws, [place] as Referring Physician (Optometry) Bjorn Loser, MD as Consulting Physician (Urology) Hayden Pedro, MD as Consulting Physician (Ophthalmology) Borum, Jaci Standard, MD as Referring Physician (Internal Medicine) Gerlene Fee, NP as Nurse Practitioner ([place]) [facility] [place] ([place])  Extended Emergency Contact Information Primary Emergency Contact: Raby,[name] Address: [address]          [place] [address] [place] Phone: [phone] Relation: Spouse Secondary Emergency Contact: Colello,[name]  [place] Relation: Daughter  Code Status:  FULL CODE Goals of care: Advanced Directive information Advanced Directives 10/04/2017  Does Patient Have a Medical Advance Directive? Yes  Type of Advance Directive Out of facility DNR (pink MOST or yellow form)  Does patient want to make changes to medical advance directive? No - Patient declined  Copy of [place] in Chart? -  Would patient like information on creating a medical advance directive? No - Patient declined  Pre-existing out of facility DNR order (yellow form or pink MOST form) Yellow form placed in chart (order not valid for inpatient use)  Some encounter information is confidential and restricted. Go to Review Flowsheets activity to see all data.     Chief Complaint  Patient presents with  . Medical Management of Chronic Issues    OPTUM    HPI:  Pt is a 77 y.o. male seen today for medical management of chronic diseases.  He has no concerns today. Appetite ok and sleeps well. No recent falls. No nursing issues. He  is a poor historian due to dementia. Hx obtained from chart. Labs reviewed from SNF Hgb 12.4  Neurogenic bladder - he has a suprapubic cath; followed by urology at [facility]. Stable on flomax 0.4 mg daily and myrbetriq 50 mg daily   Adjustment disorder with depression - mood stable on zoloft 25 mg daily   Lower extremity edema - stable on demadex 10 mg daily. Na 146  Osteoarthritis bilateral knees - pain stable on voltaren gel 4 gm to both knees four times daily  Constipation - stable on miralax daily and colace and senna twice daily as needed   Hx CVA with left hemiparesis - stable on plavix 75 mg daily  Hypertension - stable on coreg 12.5 mg twice daily; lisinopril 40 mg daily; norvasc 5 mg daily; hydralazine 75 mg four times  daily   DM - borderline controlled. A1c 7.7%. He takes novolog SSI: 60-150=3units; 151-400=5 units; lantus 12 units nightly. CBG 107. No low BS reactions. No numbness/tingling. LDL 55  Vascular dementia - stable on aricept 10 mg nightly and namdena xr 28 mg daily. Albumin 3.6. BIMS score 13.   Dyslipidemia - at goal on zocor 20 mg daily. LDL 55  Past Medical History:  Diagnosis Date  . Arthritis    "maybe"  . 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 CHF (congestive heart failure) ([place])   . Depression   . 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 Hyperlipemia   .  Hypertension   . Sleep apnea    "my machine [place]; got to get me another one" (09/01/2014)  . Stroke [facility]) 2009   left thalamic infarct, residual LUE weakness  . TIA (transient ischemic attack)    multiple in past, most recently in 2011  . Type II diabetes mellitus ([place])    Past Surgical History:  Procedure Laterality Date  . CARDIAC CATHETERIZATION  07/2010  . CRANIOTOMY  05/2008   for clipping of an incidental asymptomatic  unruptured/notes 02/13/2011  . KNEE ARTHROSCOPY Left   . 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 SHOULDER ARTHROSCOPY W/ ROTATOR CUFF REPAIR Right     Allergies  Allergen Reactions  . Pioglitazone Other (See Comments)    Edema   . Rosiglitazone Maleate Other (See Comments)    edema    Outpatient Encounter Medications as of 12/09/2017  Medication Sig  . amLODipine (NORVASC) 5 MG tablet Take 5 mg by mouth every morning.   . carvedilol (COREG) 12.5 MG tablet Take 1 tablet (12.5 mg total) by mouth 2 (two) times daily.  . clopidogrel (PLAVIX) 75 MG tablet Take 75 mg by mouth at bedtime.  . diclofenac sodium (VOLTAREN) 1 % GEL Apply 4 g topically 4 (four) times daily. Apply to Bilateral Knees  . DOCUSATE SODIUM PO Take 1 capsule by mouth every 12 (twelve) hours as needed.  . donepezil (ARICEPT) 10 MG tablet Take 1 tablet (10 mg total) by mouth at bedtime.  . hydrALAZINE (APRESOLINE) 50 MG tablet Take 75 mg by mouth 4 (four) times daily.   . insulin aspart (NOVOLOG) 100 UNIT/ML injection Inject 0-5 Units into the skin 3 (three) times daily before meals. Inject as per sliding scale: 0-59=0 call MD if less than 60.  60-150=0; 151-300 = 3 units 301-400=5 units call MD if >400  . insulin glargine (LANTUS) 100 UNIT/ML injection Inject 12 Units into the skin daily.   Marland Kitchen lisinopril (PRINIVIL,ZESTRIL) 20 MG tablet Give 2 tablets (40 mg) by mouth daily  . memantine (NAMENDA XR) 28 MG CP24 24 hr capsule Take 1 capsule (28 mg total) by mouth daily.  . mirabegron ER (MYRBETRIQ) 50 MG TB24 tablet Take 50 mg by mouth daily.  . nitroGLYCERIN (NITROSTAT) 0.4 MG SL tablet Place 0.4 mg under the tongue every 5 (five) minutes as needed for chest pain.  . phenazopyridine (PYRIDIUM) 100 MG tablet Take 100 mg by mouth 3 (three) times daily as needed for pain.  Vladimir Faster Glycol-Propyl Glycol (SYSTANE) 0.4-0.3 % SOLN Apply 1 drop to eye 2 (two) times daily. Both eyes  . polyethylene glycol (MIRALAX /  GLYCOLAX) packet Take 17 g by mouth daily.  . prednisoLONE acetate (PRED FORTE) 1 % ophthalmic suspension Place 1 drop into the left eye 4 (four) times daily.  Marland Kitchen senna (SENOKOT) 8.6 MG tablet Take 1 tablet by mouth every 12 (twelve) hours as needed for constipation.  . sertraline (ZOLOFT) 25 MG tablet Take 1 tablet (25 mg total) by mouth daily.  . simvastatin (ZOCOR) 20 MG tablet Take 1 tablet (20 mg total) by mouth at bedtime.  . tamsulosin (FLOMAX) 0.4 MG CAPS capsule Take 0.4 mg by mouth at bedtime.  . torsemide (DEMADEX) 10 MG tablet Take 10 mg by mouth daily.  . traMADol (ULTRAM) 50 MG tablet Take 1 tablet (50 mg total) by mouth every 6 (six) hours as needed for moderate pain or severe pain.   No facility-administered encounter medications on file as of 12/09/2017.  Review of Systems  Unable to perform ROS: Dementia    Immunization History  Administered Date(s) Administered  . Influenza Split 06/25/2012  . Influenza Whole 07/18/2007, 08/18/2008, 08/09/2009, 06/14/2010, 06/20/2011  . Influenza,inj,Quad PF,6+ Mos 06/24/2013, 06/08/2014  . Influenza-Unspecified 08/20/2016, 07/30/2017  . PPD Test 04/25/2016, 06/29/2016, 07/06/2016  . Pneumococcal Conjugate-13 01/06/2015  . Pneumococcal Polysaccharide-23 05/30/2008  . Tdap 06/20/2011   Pertinent  Health Maintenance Due  Topic Date Due  . FOOT EXAM  02/22/2018 (Originally 12/20/2017)  . LIPID PANEL  02/22/2018 (Originally 06/09/2015)  . OPHTHALMOLOGY EXAM  02/22/2018 (Originally 04/12/2016)  . COLONOSCOPY  08/29/2018 (Originally 08/29/2017)  . HEMOGLOBIN A1C  01/09/2018  . INFLUENZA VACCINE  Completed  . PNA vac Low Risk Adult  Completed   Fall Risk  05/22/2017 04/18/2017 09/12/2016 05/24/2016 05/09/2016  Falls in the past year? Yes No Yes Yes Yes  Number falls in past yr: 1 - 2 or more 2 or more -  Comment - - - - -  Injury with Fall? No - Yes Yes Yes  Comment - - - - -  Risk Factor Category  - - High Fall Risk High Fall Risk High  Fall Risk  Risk for fall due to : History of fall(s);Impaired balance/gait;Impaired mobility;Medication side effect - History of fall(s);Impaired balance/gait;Impaired mobility History of fall(s);Impaired balance/gait;Impaired mobility History of fall(s);Impaired balance/gait;Impaired mobility  Risk for fall due to: Comment - - - - -  Follow up Falls evaluation completed;Falls prevention discussed;Education provided;Follow up appointment - Falls prevention discussed Education provided;Falls prevention discussed Falls prevention discussed   Functional Status Survey:    Vitals:   12/09/17 1506  BP: 138/80  Pulse: 80  Temp: 97.9 F (36.6 C)  SpO2: 98%  Weight: 183 lb (83 kg)   Body mass index is 28.66 kg/m. Physical Exam  Constitutional: He appears well-developed and well-nourished.  Sitting up in bed in NAD  HENT:  Mouth/Throat: Oropharynx is clear and moist.  MMM; no oral thrush  Eyes: Pupils are equal, round, and reactive to light. No scleral icterus.  Neck: Neck supple. Carotid bruit is not present. No thyromegaly present.  Cardiovascular: Normal rate, regular rhythm and intact distal pulses. Exam reveals no gallop and no friction rub.  Murmur (1/6 SEM) heard. Trace LE edema b/l. No calf TTP  Pulmonary/Chest: Effort normal and breath sounds normal. He has no wheezes. He has no rales. He exhibits no tenderness.  Abdominal: Soft. Normal appearance and bowel sounds are normal. He exhibits no distension, no abdominal bruit, no pulsatile midline mass and no mass. There is no hepatomegaly. There is no tenderness. There is no rigidity, no rebound and no guarding. No hernia.  Obese; suprapubic cath intact with no d/c or redness at insertion site  Genitourinary:  Genitourinary Comments: Foley from suprapubic cath DTG clear yellow urine  Musculoskeletal: He exhibits edema.  Lymphadenopathy:    He has no cervical adenopathy.  Neurological: He is alert.  Left hemiparesis  Skin: Skin is  warm and dry. No rash noted.  Psychiatric: He has a normal mood and affect. His behavior is normal. Thought content normal.    Labs reviewed: Recent Labs    02/25/17 06/20/17 08/01/17  NA 145 141 148*  K 3.8 3.8 3.7  BUN 18 19 21   CREATININE 0.9 0.9 1.0   Recent Labs    02/25/17 08/01/17  AST 7* 11*  ALT 7* 15  ALKPHOS 74 100   Recent Labs    02/25/17 06/20/17 08/01/17  WBC  7.0 10.9 7.6  NEUTROABS 4 8 5   HGB 11.8* 11.1* 11.5*  HCT 39* 36* 37*  PLT 181 274 280   Lab Results  Component Value Date   TSH 1.50 08/01/2017   Lab Results  Component Value Date   HGBA1C 7.7 10/11/2017   Lab Results  Component Value Date   CHOL 102 06/08/2014   HDL 47 06/08/2014   LDLCALC 46 06/08/2014   TRIG 47 06/08/2014   CHOLHDL 2.2 06/08/2014    Significant Diagnostic Results in last 30 days:  No results found.  Assessment/Plan   ICD-10-CM   1. Vascular dementia without behavioral disturbance F01.50   2. Type II diabetes mellitus with neurological manifestations ([place]) E11.49   3. Neurogenic bladder N31.9   4. History of CVA with residual deficit I69.30   5. Hyperlipidemia associated with type 2 diabetes mellitus ([place]) E11.69    E78.5     Cont current meds as ordered  PT/OT/ST as indicated  Suprapubic cath care as indicated  F/u with [facility] urology as scheduled  Will follow  Labs/tests ordered: None   Rylan Kaufmann S. Perlie Gold  [facility] and Adult Medicine [address] [place], [place] [address] [phone] Cell (Monday-Friday 8 AM - 5 PM) [phone] After 5 PM and follow prompts

## 2018-02-03 ENCOUNTER — Non-Acute Institutional Stay (SKILLED_NURSING_FACILITY): Payer: Medicare Other | Admitting: Internal Medicine

## 2018-02-03 ENCOUNTER — Encounter: Payer: Self-pay | Admitting: Internal Medicine

## 2018-02-03 DIAGNOSIS — F0150 Vascular dementia without behavioral disturbance: Secondary | ICD-10-CM | POA: Diagnosis not present

## 2018-02-03 DIAGNOSIS — N319 Neuromuscular dysfunction of bladder, unspecified: Secondary | ICD-10-CM | POA: Diagnosis not present

## 2018-02-03 DIAGNOSIS — F4323 Adjustment disorder with mixed anxiety and depressed mood: Secondary | ICD-10-CM | POA: Diagnosis not present

## 2018-02-03 DIAGNOSIS — E1149 Type 2 diabetes mellitus with other diabetic neurological complication: Secondary | ICD-10-CM

## 2018-02-03 DIAGNOSIS — I6930 Unspecified sequelae of cerebral infarction: Secondary | ICD-10-CM

## 2018-02-03 DIAGNOSIS — I10 Essential (primary) hypertension: Secondary | ICD-10-CM | POA: Diagnosis not present

## 2018-02-03 DIAGNOSIS — I69354 Hemiplegia and hemiparesis following cerebral infarction affecting left non-dominant side: Secondary | ICD-10-CM | POA: Diagnosis not present

## 2018-02-03 NOTE — Progress Notes (Signed)
Patient ID: [name], male   DOB: 19-Dec-1940, 77 y.o.   MRN: [id number]  Location:  [place] Room Number: 151 A Place of Service:  SNF (31) Provider:  [place], [place], DO  Patient Care Team: Gildardo Cranker, DO as PCP - General (Internal Medicine) Webb Laws, [place] as Referring Physician (Optometry) Bjorn Loser, MD as Consulting Physician (Urology) Hayden Pedro, MD as Consulting Physician (Ophthalmology) Borum, Jaci Standard, MD as Referring Physician (Internal Medicine) Gerlene Fee, NP as Nurse Practitioner ([place]) [facility] [place] ([place])  Extended Emergency Contact Information Primary Emergency Contact: Wiedeman,[name] Address: [address]          [place] [address] [place] Phone: [phone] Relation: Spouse Secondary Emergency Contact: Riden,[name]  [place] Relation: Daughter  Code Status:  Full Code Goals of care: Advanced Directive information Advanced Directives 02/03/2018  Does Patient Have a Medical Advance Directive? Yes  Type of Advance Directive Out of facility DNR (pink MOST or yellow form)  Does patient want to make changes to medical advance directive? No - Patient declined  Copy of [place] in Chart? -  Would patient like information on creating a medical advance directive? -  Pre-existing out of facility DNR order (yellow form or pink MOST form) Pink MOST form placed in chart (order not valid for inpatient use)  Some encounter information is confidential and restricted. Go to Review Flowsheets activity to see all data.     Chief Complaint  Patient presents with  . Medical Management of Chronic Issues    Optum    HPI:  Pt is a 77 y.o. male seen today for medical management of chronic diseases.  He has no concerns. No nursing issues. No falls. He is a poor historian due to dementia. Hx  obtained from chart.  Neurogenic bladder - unchanged; he has a suprapubic cath; followed by urology at [facility]. Stable on flomax 0.4 mg daily and myrbetriq 50 mg daily   Adjustment disorder with depression - controlled on zoloft 25 mg daily. He does benefit from this regiman   Lower extremity edema - stable on demadex 10 mg daily. Na 146  Osteoarthritis bilateral knees - pain controlled on voltaren gel 4 gm to both knees four times daily  Constipation - controlled on miralax daily and colace and senna twice daily as needed   Hx CVA with left hemiparesis - unchanged; takes plavix 75 mg daily  Hypertension - controlled on coreg 12.5 mg twice daily; lisinopril 40 mg daily; norvasc 5 mg daily; hydralazine 75 mg four times  daily   DM - stable. A1c 7.7%. He takes novolog SSI: 60-150=3units; 151-400=5 units; lantus 12 units nightly. No low BS reactions. No numbness/tingling. LDL 55  Vascular dementia - unchanged on aricept 10 mg nightly and namenda xr 28 mg daily. Albumin 3.6. BIMS score 13.   Dyslipidemia - controlled on zocor 20 mg daily. LDL 55  Past Medical History:  Diagnosis Date  . Arthritis    "maybe"  . 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 CHF (congestive heart failure) ([place])   . Depression   . 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 Hyperlipemia   . Hypertension   . Sleep apnea    "  my machine [place]; got to get me another one" (09/01/2014)  . Stroke [facility]) 2009   left thalamic infarct, residual LUE weakness  . TIA (transient ischemic attack)    multiple in past, most recently in 2011  . Type II diabetes mellitus ([place])    Past Surgical History:  Procedure Laterality Date  . CARDIAC CATHETERIZATION  07/2010  . CRANIOTOMY  05/2008   for clipping of an incidental asymptomatic unruptured/notes 02/13/2011  . KNEE  ARTHROSCOPY Left   . 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 SHOULDER ARTHROSCOPY W/ ROTATOR CUFF REPAIR Right     Allergies  Allergen Reactions  . Pioglitazone Other (See Comments)    Edema   . Rosiglitazone Maleate Other (See Comments)    edema    Outpatient Encounter Medications as of 02/03/2018  Medication Sig  . amLODipine (NORVASC) 5 MG tablet Take 5 mg by mouth every morning.   . carvedilol (COREG) 12.5 MG tablet Take 1 tablet (12.5 mg total) by mouth 2 (two) times daily.  . clopidogrel (PLAVIX) 75 MG tablet Take 75 mg by mouth at bedtime.  . diclofenac sodium (VOLTAREN) 1 % GEL Apply 4 g topically 4 (four) times daily. Apply to Bilateral Knees  . DOCUSATE SODIUM PO Take 1 capsule by mouth every 12 (twelve) hours as needed.  . donepezil (ARICEPT) 10 MG tablet Take 1 tablet (10 mg total) by mouth at bedtime.  . hydrALAZINE (APRESOLINE) 50 MG tablet Take 75 mg by mouth 4 (four) times daily.   . insulin aspart (NOVOLOG) 100 UNIT/ML injection Inject 0-5 Units into the skin 3 (three) times daily before meals. Inject as per sliding scale: 0-59=0 call MD if less than 60.  60-150=0; 151-300 = 3 units 301-400=5 units call MD if >400  . insulin glargine (LANTUS) 100 UNIT/ML injection Inject 12 Units into the skin daily.   Marland Kitchen lisinopril (PRINIVIL,ZESTRIL) 20 MG tablet Give 2 tablets (40 mg) by mouth daily  . memantine (NAMENDA XR) 28 MG CP24 24 hr capsule Take 1 capsule (28 mg total) by mouth daily.  . mirabegron ER (MYRBETRIQ) 50 MG TB24 tablet Take 50 mg by mouth daily.  . nitroGLYCERIN (NITROSTAT) 0.4 MG SL tablet Place 0.4 mg under the tongue every 5 (five) minutes as needed for chest pain.  . phenazopyridine (PYRIDIUM) 100 MG tablet Take 100 mg by mouth 3 (three) times daily as needed for pain.  Vladimir Faster Glycol-Propyl Glycol (SYSTANE) 0.4-0.3 % SOLN Apply 1 drop to eye 2 (two) times daily. Both eyes  . polyethylene glycol (MIRALAX / GLYCOLAX) packet Take 17 g by mouth daily.   . prednisoLONE acetate (PRED FORTE) 1 % ophthalmic suspension Place 1 drop into the left eye 4 (four) times daily.  Marland Kitchen senna (SENOKOT) 8.6 MG tablet Take 1 tablet by mouth every 12 (twelve) hours as needed for constipation.  . sertraline (ZOLOFT) 25 MG tablet Take 1 tablet (25 mg total) by mouth daily.  . simvastatin (ZOCOR) 20 MG tablet Take 1 tablet (20 mg total) by mouth at bedtime.  . tamsulosin (FLOMAX) 0.4 MG CAPS capsule Take 0.4 mg by mouth at bedtime.  . torsemide (DEMADEX) 10 MG tablet Take 10 mg by mouth daily.  . traMADol (ULTRAM) 50 MG tablet Take 1 tablet (50 mg total) by mouth every 6 (six) hours as needed for moderate pain or severe pain.   No facility-administered encounter medications on file as of 02/03/2018.     Review of Systems  Unable  to perform ROS: Dementia    Immunization History  Administered Date(s) Administered  . Influenza Split 06/25/2012  . Influenza Whole 07/18/2007, 08/18/2008, 08/09/2009, 06/14/2010, 06/20/2011  . Influenza,inj,Quad PF,6+ Mos 06/24/2013, 06/08/2014  . Influenza-Unspecified 08/20/2016, 07/30/2017  . PPD Test 04/25/2016, 06/29/2016, 07/06/2016  . Pneumococcal Conjugate-13 01/06/2015  . Pneumococcal Polysaccharide-23 05/30/2008  . Tdap 06/20/2011   Pertinent  Health Maintenance Due  Topic Date Due  . FOOT EXAM  02/22/2018 (Originally 12/20/2017)  . LIPID PANEL  02/22/2018 (Originally 06/09/2015)  . OPHTHALMOLOGY EXAM  02/22/2018 (Originally 04/12/2016)  . HEMOGLOBIN A1C  03/05/2018 (Originally 01/09/2018)  . COLONOSCOPY  08/29/2018 (Originally 08/29/2017)  . INFLUENZA VACCINE  05/15/2018  . PNA vac Low Risk Adult  Completed   Fall Risk  05/22/2017 04/18/2017 09/12/2016 05/24/2016 05/09/2016  Falls in the past year? Yes No Yes Yes Yes  Number falls in past yr: 1 - 2 or more 2 or more -  Comment - - - - -  Injury with Fall? No - Yes Yes Yes  Comment - - - - -  Risk Factor Category  - - High Fall Risk High Fall Risk High Fall Risk  Risk  for fall due to : History of fall(s);Impaired balance/gait;Impaired mobility;Medication side effect - History of fall(s);Impaired balance/gait;Impaired mobility History of fall(s);Impaired balance/gait;Impaired mobility History of fall(s);Impaired balance/gait;Impaired mobility  Risk for fall due to: Comment - - - - -  Follow up Falls evaluation completed;Falls prevention discussed;Education provided;Follow up appointment - Falls prevention discussed Education provided;Falls prevention discussed Falls prevention discussed   Functional Status Survey:    Vitals:   02/03/18 1111  BP: (!) 122/57  Pulse: 70  Resp: 18  Temp: 97.9 F (36.6 C)  SpO2: 98%  Weight: 191 lb 12.8 oz (87 kg)  Height: 5\' 7"  (1.702 m)   Body mass index is 30.04 kg/m. Physical Exam  Constitutional: He appears well-developed and well-nourished.  Sitting in w/c in NAD  HENT:  Mouth/Throat: Oropharynx is clear and moist.  MMM; no oral thrush  Eyes: Pupils are equal, round, and reactive to light. No scleral icterus.  Neck: Neck supple. Carotid bruit is not present. No thyromegaly present.  Cardiovascular: Normal rate, regular rhythm and intact distal pulses. Exam reveals no gallop and no friction rub.  Murmur heard. LUE/LLE edema; no calf TTP b/l  Pulmonary/Chest: Effort normal and breath sounds normal. He has no wheezes. He has no rales. He exhibits no tenderness.  Abdominal: Soft. Bowel sounds are normal. He exhibits no distension, no abdominal bruit, no pulsatile midline mass and no mass. There is no hepatomegaly. There is no tenderness. There is no rebound and no guarding.  obese  Genitourinary:  Genitourinary Comments: Suprapubic cath intact with foley DTG clear yellow urine  Lymphadenopathy:    He has no cervical adenopathy.  Neurological: He is alert. He has normal reflexes.  Left hemiparesis  Skin: Skin is warm and dry. No rash noted.  Psychiatric: He has a normal mood and affect. His behavior is normal.  Thought content normal.    Labs reviewed: Recent Labs    02/25/17 06/20/17 08/01/17  NA 145 141 148*  K 3.8 3.8 3.7  BUN 18 19 21   CREATININE 0.9 0.9 1.0   Recent Labs    02/25/17 08/01/17  AST 7* 11*  ALT 7* 15  ALKPHOS 74 100   Recent Labs    02/25/17 06/20/17 08/01/17  WBC 7.0 10.9 7.6  NEUTROABS 4 8 5   HGB 11.8* 11.1* 11.5*  HCT 39* 36* 37*  PLT 181 274 280   Lab Results  Component Value Date   TSH 1.50 08/01/2017   Lab Results  Component Value Date   HGBA1C 7.7 10/11/2017   Lab Results  Component Value Date   CHOL 102 06/08/2014   HDL 47 06/08/2014   LDLCALC 46 06/08/2014   TRIG 47 06/08/2014   CHOLHDL 2.2 06/08/2014    Significant Diagnostic Results in last 30 days:  No results found.  Assessment/Plan   ICD-10-CM   1. Neurogenic bladder N31.9    s/p suprapubic cath  2. Type II diabetes mellitus with neurological manifestations ([place]) E11.49   3. Adjustment disorder with mixed anxiety and depressed mood F43.23   4. Vascular dementia without behavioral disturbance F01.50   5. History of CVA with residual deficit I69.30   6. Hemiparesis affecting left side as late effect of cerebrovascular accident ([place]) I69.354   7. Essential hypertension I10     Cont current meds as ordered  PT/OT/ST as indicated  Suprapubic cath care as indicated  OPTUM NP to follow  Will follow  Labs/tests ordered: a1c   Toya Palacios S. Perlie Gold  [facility] and Adult Medicine [address] [place],  [address] [phone] Cell (Monday-Friday 8 AM - 5 PM) [phone] After 5 PM and follow prompts

## 2018-02-04 LAB — HEMOGLOBIN A1C: HEMOGLOBIN A1C: 7

## 2018-02-11 ENCOUNTER — Encounter (INDEPENDENT_AMBULATORY_CARE_PROVIDER_SITE_OTHER): Payer: Medicare Other | Admitting: Ophthalmology

## 2018-02-11 DIAGNOSIS — H43813 Vitreous degeneration, bilateral: Secondary | ICD-10-CM | POA: Diagnosis not present

## 2018-02-11 DIAGNOSIS — H35033 Hypertensive retinopathy, bilateral: Secondary | ICD-10-CM | POA: Diagnosis not present

## 2018-02-11 DIAGNOSIS — H59032 Cystoid macular edema following cataract surgery, left eye: Secondary | ICD-10-CM | POA: Diagnosis not present

## 2018-02-11 DIAGNOSIS — E113212 Type 2 diabetes mellitus with mild nonproliferative diabetic retinopathy with macular edema, left eye: Secondary | ICD-10-CM | POA: Diagnosis not present

## 2018-02-11 DIAGNOSIS — I10 Essential (primary) hypertension: Secondary | ICD-10-CM | POA: Diagnosis not present

## 2018-02-11 DIAGNOSIS — H35372 Puckering of macula, left eye: Secondary | ICD-10-CM | POA: Diagnosis not present

## 2018-02-11 DIAGNOSIS — E11311 Type 2 diabetes mellitus with unspecified diabetic retinopathy with macular edema: Secondary | ICD-10-CM

## 2018-02-11 DIAGNOSIS — E113391 Type 2 diabetes mellitus with moderate nonproliferative diabetic retinopathy without macular edema, right eye: Secondary | ICD-10-CM

## 2018-02-12 ENCOUNTER — Other Ambulatory Visit: Payer: Self-pay

## 2018-02-12 MED ORDER — TRAMADOL HCL 50 MG PO TABS: 50.0000 mg | ORAL_TABLET | Freq: Four times a day (QID) | ORAL | 4 refills | Status: DC | PRN

## 2018-02-12 NOTE — Telephone Encounter (Signed)
Rx faxed to [facility] (p) [phone], (f) [phone]  

## 2018-02-24 NOTE — Progress Notes (Signed)
[id number]   

## 2018-03-20 ENCOUNTER — Encounter: Payer: Self-pay | Admitting: Internal Medicine

## 2018-03-20 ENCOUNTER — Non-Acute Institutional Stay (SKILLED_NURSING_FACILITY): Payer: Medicare Other | Admitting: Internal Medicine

## 2018-03-20 DIAGNOSIS — E1149 Type 2 diabetes mellitus with other diabetic neurological complication: Secondary | ICD-10-CM | POA: Diagnosis not present

## 2018-03-20 DIAGNOSIS — I6930 Unspecified sequelae of cerebral infarction: Secondary | ICD-10-CM | POA: Diagnosis not present

## 2018-03-20 DIAGNOSIS — I10 Essential (primary) hypertension: Secondary | ICD-10-CM

## 2018-03-20 DIAGNOSIS — N319 Neuromuscular dysfunction of bladder, unspecified: Secondary | ICD-10-CM

## 2018-03-20 DIAGNOSIS — F0150 Vascular dementia without behavioral disturbance: Secondary | ICD-10-CM

## 2018-03-20 NOTE — Progress Notes (Signed)
Patient ID: [name], male   DOB: 1941/03/20, 77 y.o.   MRN: [id number]  Location:  [place] Room Number: 482 [id number] Place of Service:  SNF (31) Provider:  [place], [place] Grant, DO  Patient Care Team: Gildardo Cranker, DO as PCP - General (Internal Medicine) Webb Laws, Preston as Referring Physician (Optometry) Bjorn Loser, MD as Consulting Physician (Urology) Hayden Pedro, MD as Consulting Physician (Ophthalmology) Borum, Jaci Standard, MD as Referring Physician (Internal Medicine) Gerlene Fee, NP as Nurse Practitioner ([place]) [facility] [place] ([place])  Extended Emergency Contact Information Primary Emergency Contact: Abascal,[name] Address: [address]          [place] [address] [place] Phone: [phone] Relation: Spouse Secondary Emergency Contact: Marlett,[name]  [place] Relation: Daughter  Code Status:  Full code Goals of care: Advanced Directive information Advanced Directives 03/20/2018  Does Patient Have a Medical Advance Directive? Yes  Type of Advance Directive Out of facility DNR (pink MOST or yellow form)  Does patient want to make changes to medical advance directive? No - Patient declined  Copy of [place] in Chart? -  Would patient like information on creating a medical advance directive? -  Pre-existing out of facility DNR order (yellow form or pink MOST form) Pink MOST form placed in chart (order not valid for inpatient use)  Some encounter information is confidential and restricted. Go to Review Flowsheets activity to see all data.     Chief Complaint  Patient presents with  . Medical Management of Chronic Issues    [facility]    HPI:  Pt is a 77 y.o. male seen today for medical management of chronic diseases.  He has no concerns today. CBGs 110-180s. No low BS reactions. No nursing issues. No recent  falls. He is a poor historian due to dementia. Hx obtained from chart.  Neurogenic bladder - unchanged; he has a suprapubic cath; followed by urology at [facility]. Stable on flomax 0.4 mg daily and myrbetriq 50 mg daily   Adjustment disorder with depression - controlled on zoloft 25 mg daily. He does benefit from this regiman   Lower extremity edema - stable on demadex 10 mg daily. Na 146  Osteoarthritis bilateral knees - pain controlled on voltaren gel 4 gm to both knees four times daily  Constipation - controlled on miralax daily and colace and senna twice daily as needed   Hx CVA with left hemiparesis - unchanged; takes plavix 75 mg daily  Hypertension - controlled on coreg 12.5 mg twice daily; lisinopril 40 mg daily; norvasc 5 mg daily; hydralazine 75 mg four times  daily   DM - stable. A1c 7.7%. He takes novolog SSI: 60-150=3units; 151-400=5 units; lantus 12 units nightly. No low BS reactions. No numbness/tingling. LDL 55  Vascular dementia - unchanged on aricept 10 mg nightly and namenda xr 28 mg daily. Albumin 3.6. BIMS score 13.   Dyslipidemia - controlled on zocor 20 mg daily. LDL 55  Past Medical History:  Diagnosis Date  . Arthritis    "maybe"  . 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 CHF (congestive heart failure) ([place])   . Depression   . 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 Hyperlipemia   .  Hypertension   . Sleep apnea    "my machine [place]; got to get me another one" (09/01/2014)  . Stroke [facility]) 2009   left thalamic infarct, residual LUE weakness  . TIA (transient ischemic attack)    multiple in past, most recently in 2011  . Type II diabetes mellitus ([place])    Past Surgical History:  Procedure Laterality Date  . CARDIAC CATHETERIZATION  07/2010  . CRANIOTOMY  05/2008   for clipping of an incidental  asymptomatic unruptured/notes 02/13/2011  . KNEE ARTHROSCOPY Left   . 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 SHOULDER ARTHROSCOPY W/ ROTATOR CUFF REPAIR Right     Allergies  Allergen Reactions  . Pioglitazone Other (See Comments)    Edema   . Rosiglitazone Maleate Other (See Comments)    edema    Outpatient Encounter Medications as of 03/20/2018  Medication Sig  . amLODipine (NORVASC) 10 MG tablet Take 10 mg by mouth every morning.   . carvedilol (COREG) 12.5 MG tablet Take 1 tablet (12.5 mg total) by mouth 2 (two) times daily.  . clopidogrel (PLAVIX) 75 MG tablet Take 75 mg by mouth at bedtime.  . diclofenac sodium (VOLTAREN) 1 % GEL Apply 4 g topically 4 (four) times daily. Apply to Bilateral Knees  . DOCUSATE SODIUM PO Take 1 capsule by mouth every 12 (twelve) hours as needed.  . donepezil (ARICEPT) 10 MG tablet Take 1 tablet (10 mg total) by mouth at bedtime.  . hydrALAZINE (APRESOLINE) 50 MG tablet Take 75 mg by mouth 4 (four) times daily.   . insulin aspart (NOVOLOG) 100 UNIT/ML injection Inject 0-5 Units into the skin 3 (three) times daily before meals. Inject as per sliding scale: 0-59=0 call MD if less than 60.  60-150=0; 151-300 = 3 units 301-400=5 units call MD if >400  . insulin glargine (LANTUS) 100 UNIT/ML injection Inject 12 Units into the skin daily.   Marland Kitchen lisinopril (PRINIVIL,ZESTRIL) 20 MG tablet Give 2 tablets (40 mg) by mouth daily  . memantine (NAMENDA XR) 28 MG CP24 24 hr capsule Take 1 capsule (28 mg total) by mouth daily.  . mirabegron ER (MYRBETRIQ) 50 MG TB24 tablet Take 50 mg by mouth daily.  . nitroGLYCERIN (NITROSTAT) 0.4 MG SL tablet Place 0.4 mg under the tongue every 5 (five) minutes as needed for chest pain.  . phenazopyridine (PYRIDIUM) 100 MG tablet Take 100 mg by mouth 3 (three) times daily as needed for pain.  Vladimir Faster Glycol-Propyl Glycol (SYSTANE) 0.4-0.3 % SOLN Apply 1 drop to eye 2 (two) times daily. Both eyes  . polyethylene glycol  (MIRALAX / GLYCOLAX) packet Take 17 g by mouth daily.  . prednisoLONE acetate (PRED FORTE) 1 % ophthalmic suspension Place 1 drop into the left eye 4 (four) times daily.  Marland Kitchen senna (SENOKOT) 8.6 MG tablet Take 1 tablet by mouth every 12 (twelve) hours as needed for constipation.  . sertraline (ZOLOFT) 25 MG tablet Take 1 tablet (25 mg total) by mouth daily.  . simvastatin (ZOCOR) 20 MG tablet Take 1 tablet (20 mg total) by mouth at bedtime.  . tamsulosin (FLOMAX) 0.4 MG CAPS capsule Take 0.4 mg by mouth at bedtime.  Marland Kitchen tiZANidine (ZANAFLEX) 2 MG tablet Take 2 mg by mouth 2 (two) times daily.  Marland Kitchen torsemide (DEMADEX) 10 MG tablet Take 10 mg by mouth daily.  . traMADol (ULTRAM) 50 MG tablet Take 1 tablet (50 mg total) by mouth every 6 (six) hours as needed for  moderate pain or severe pain.   No facility-administered encounter medications on file as of 03/20/2018.     Review of Systems  Unable to perform ROS: Dementia    Immunization History  Administered Date(s) Administered  . Influenza Split 06/25/2012  . Influenza Whole 07/18/2007, 08/18/2008, 08/09/2009, 06/14/2010, 06/20/2011  . Influenza,inj,Quad PF,6+ Mos 06/24/2013, 06/08/2014  . Influenza-Unspecified 08/20/2016, 07/30/2017  . PPD Test 04/25/2016, 06/29/2016, 07/06/2016  . Pneumococcal Conjugate-13 01/06/2015  . Pneumococcal Polysaccharide-23 05/30/2008  . Tdap 06/20/2011   Pertinent  Health Maintenance Due  Topic Date Due  . OPHTHALMOLOGY EXAM  04/21/2018 (Originally 04/12/2016)  . COLONOSCOPY  08/29/2018 (Originally 08/29/2017)  . HEMOGLOBIN A1C  05/06/2018  . INFLUENZA VACCINE  05/15/2018  . LIPID PANEL  11/07/2018  . FOOT EXAM  03/06/2019  . PNA vac Low Risk Adult  Completed   Fall Risk  05/22/2017 04/18/2017 09/12/2016 05/24/2016 05/09/2016  Falls in the past year? Yes No Yes Yes Yes  Number falls in past yr: 1 - 2 or more 2 or more -  Comment - - - - -  Injury with Fall? No - Yes Yes Yes  Comment - - - - -  Risk Factor  Category  - - High Fall Risk High Fall Risk High Fall Risk  Risk for fall due to : History of fall(s);Impaired balance/gait;Impaired mobility;Medication side effect - History of fall(s);Impaired balance/gait;Impaired mobility History of fall(s);Impaired balance/gait;Impaired mobility History of fall(s);Impaired balance/gait;Impaired mobility  Risk for fall due to: Comment - - - - -  Follow up Falls evaluation completed;Falls prevention discussed;Education provided;Follow up appointment - Falls prevention discussed Education provided;Falls prevention discussed Falls prevention discussed   Functional Status Survey:    Vitals:   03/20/18 1215  BP: (!) 168/70  Pulse: 70  Resp: 18  Temp: 98.1 F (36.7 C)  SpO2: 95%  Weight: 193 lb 12.8 oz (87.9 kg)  Height: 5\' 7"  (1.702 m)   Body mass index is 30.35 kg/m. Physical Exam  Constitutional: He appears well-developed and well-nourished.  Looks well in NAD, sitting in w/c  HENT:  Mouth/Throat: Oropharynx is clear and moist.  MMM; no oral thrush  Eyes: Pupils are equal, round, and reactive to light. No scleral icterus.  Neck: Neck supple. Carotid bruit is not present. No thyromegaly present.  Cardiovascular: Normal rate, regular rhythm and intact distal pulses. Exam reveals no gallop and no friction rub.  Murmur (1/6 SEM) heard. Trace LE edema b/l. No calf TTP  Pulmonary/Chest: Effort normal and breath sounds normal. He has no wheezes. He has no rales. He exhibits no tenderness.  Reduced BS at base. No w/r/r  Abdominal: Soft. Bowel sounds are normal. He exhibits no distension, no abdominal bruit, no pulsatile midline mass and no mass. There is no hepatomegaly. There is no tenderness. There is no rebound and no guarding.  Genitourinary:  Genitourinary Comments: Foley cath intact and DTG clear yellow urine  Musculoskeletal: He exhibits edema.  Lymphadenopathy:    He has no cervical adenopathy.  Neurological: He is alert. He has normal  reflexes.  Left hemiparesis  Skin: Skin is warm and dry. No rash noted.  Psychiatric: He has a normal mood and affect. His behavior is normal. Thought content normal.    Labs reviewed: Recent Labs    06/20/17 08/01/17 11/07/17  NA 141 148* 146  K 3.8 3.7 3.9  BUN 19 21 18   CREATININE 0.9 1.0 0.9   Recent Labs    08/01/17 11/07/17  AST 11* 13*  ALT 15 13  ALKPHOS 100 84   Recent Labs    06/20/17 08/01/17 11/07/17  WBC 10.9 7.6 5.5  NEUTROABS 8 5 3   HGB 11.1* 11.5* 12.4*  HCT 36* 37* 41  PLT 274 280 189   Lab Results  Component Value Date   TSH 1.50 08/01/2017   Lab Results  Component Value Date   HGBA1C 7.0 02/04/2018   Lab Results  Component Value Date   CHOL 106 11/07/2017   HDL 30 (A) 11/07/2017   LDLCALC 55 11/07/2017   TRIG 107 11/07/2017   CHOLHDL 2.2 06/08/2014    Significant Diagnostic Results in last 30 days:  No results found.  Assessment/Plan   ICD-10-CM   1. Vascular dementia without behavioral disturbance F01.50   2. Type II diabetes mellitus with neurological manifestations ([place]) E11.49   3. History of CVA with residual deficit I69.30   4. Essential hypertension I10   5. Neurogenic bladder N31.9     Cont current meds as ordered  PT/OT/ST as indicated  Foley cath care as indicated  F/u with urology as scheduled  [facility] NP will follow  Will follow  Labs/tests ordered: none   [name]  [facility] [address] [place], [place] [address] [phone] Cell (Monday-Friday 8 AM - 5 PM) [phone] After 5 PM and follow prompts

## 2018-04-20 ENCOUNTER — Encounter: Payer: Self-pay | Admitting: Internal Medicine

## 2018-05-01 ENCOUNTER — Non-Acute Institutional Stay (SKILLED_NURSING_FACILITY): Payer: Medicare Other

## 2018-05-01 DIAGNOSIS — Z0000 Encounter for general adult medical examination without abnormal findings: Secondary | ICD-10-CM

## 2018-05-01 NOTE — Patient Instructions (Signed)
[name] , Thank you for taking time to come for your Medicare Wellness Visit. I appreciate your ongoing commitment to your health goals. Please review the following plan we discussed and let me know if I can assist you in the future.   Screening recommendations/referrals: Colonoscopy excluded age 75 Recommended yearly ophthalmology/optometry visit for glaucoma screening and checkup Recommended yearly dental visit for hygiene and checkup  Vaccinations: Influenza vaccine due 2019 fall seaosn Pneumococcal vaccine up to date, completed Tdap vaccine up to date, due 06/19/2021 Shingles vaccine not in past records    Advanced directives: in chart  Conditions/risks identified: none  Next appointment: Dr. Eulas Post makes rounds  Preventive Care 30 Years and Older, Male Preventive care refers to lifestyle choices and visits with your health care provider that can promote health and wellness. What does preventive care include?  A yearly physical exam. This is also called an annual well check.  Dental exams once or twice a year.  Routine eye exams. Ask your health care provider how often you should have your eyes checked.  Personal lifestyle choices, including:  Daily care of your teeth and gums.  Regular physical activity.  Eating a healthy diet.  Avoiding tobacco and drug use.  Limiting alcohol use.  Practicing safe sex.  Taking low doses of aspirin every day.  Taking vitamin and mineral supplements as recommended by your health care provider. What happens during an annual well check? The services and screenings done by your health care provider during your annual well check will depend on your age, overall health, lifestyle risk factors, and family history of disease. Counseling  Your health care provider may ask you questions about your:  Alcohol use.  Tobacco use.  Drug use.  Emotional well-being.  Home and relationship well-being.  Sexual activity.  Eating  habits.  History of falls.  Memory and ability to understand (cognition).  Work and work Statistician. Screening  You may have the following tests or measurements:  Height, weight, and BMI.  Blood pressure.  Lipid and cholesterol levels. These may be checked every 5 years, or more frequently if you are over 35 years old.  Skin check.  Lung cancer screening. You may have this screening every year starting at age 36 if you have a 30-pack-year history of smoking and currently smoke or have quit within the past 15 years.  Fecal occult blood test (FOBT) of the stool. You may have this test every year starting at age 38.  Flexible sigmoidoscopy or colonoscopy. You may have a sigmoidoscopy every 5 years or a colonoscopy every 10 years starting at age 26.  Prostate cancer screening. Recommendations will vary depending on your family history and other risks.  Hepatitis C blood test.  Hepatitis B blood test.  Sexually transmitted disease (STD) testing.  Diabetes screening. This is done by checking your blood sugar (glucose) after you have not eaten for a while (fasting). You may have this done every 1-3 years.  Abdominal aortic aneurysm (AAA) screening. You may need this if you are a current or former smoker.  Osteoporosis. You may be screened starting at age 42 if you are at high risk. Talk with your health care provider about your test results, treatment options, and if necessary, the need for more tests. Vaccines  Your health care provider may recommend certain vaccines, such as:  Influenza vaccine. This is recommended every year.  Tetanus, diphtheria, and acellular pertussis (Tdap, Td) vaccine. You may need a Td booster every 10  years.  Zoster vaccine. You may need this after age 94.  Pneumococcal 13-valent conjugate (PCV13) vaccine. One dose is recommended after age 76.  Pneumococcal polysaccharide (PPSV23) vaccine. One dose is recommended after age 64. Talk to your health  care provider about which screenings and vaccines you need and how often you need them. This information is not intended to replace advice given to you by your health care provider. Make sure you discuss any questions you have with your health care provider. Document Released: 10/28/2015 Document Revised: 06/20/2016 Document Reviewed: 08/02/2015 Elsevier Interactive Patient Education  2017 [place] Prevention in the Home Falls can cause injuries. They can happen to people of all ages. There are many things you can do to make your home safe and to help prevent falls. What can I do on the outside of my home?  Regularly fix the edges of walkways and driveways and fix any cracks.  Remove anything that might make you trip as you walk through a door, such as a raised step or threshold.  Trim any bushes or trees on the path to your home.  Use bright outdoor lighting.  Clear any walking paths of anything that might make someone trip, such as rocks or tools.  Regularly check to see if handrails are loose or broken. Make sure that both sides of any steps have handrails.  Any raised decks and porches should have guardrails on the edges.  Have any leaves, snow, or ice cleared regularly.  Use sand or salt on walking paths during winter.  Clean up any spills in your garage right away. This includes oil or grease spills. What can I do in the bathroom?  Use night lights.  Install grab bars by the toilet and in the tub and shower. Do not use towel bars as grab bars.  Use non-skid mats or decals in the tub or shower.  If you need to sit down in the shower, use a plastic, non-slip stool.  Keep the floor dry. Clean up any water that spills on the floor as soon as it happens.  Remove soap buildup in the tub or shower regularly.  Attach bath mats securely with double-sided non-slip rug tape.  Do not have throw rugs and other things on the floor that can make you trip. What can I do  in the bedroom?  Use night lights.  Make sure that you have a light by your bed that is easy to reach.  Do not use any sheets or blankets that are too big for your bed. They should not hang down onto the floor.  Have a firm chair that has side arms. You can use this for support while you get dressed.  Do not have throw rugs and other things on the floor that can make you trip. What can I do in the kitchen?  Clean up any spills right away.  Avoid walking on wet floors.  Keep items that you use a lot in easy-to-reach places.  If you need to reach something above you, use a strong step stool that has a grab bar.  Keep electrical cords out of the way.  Do not use floor polish or wax that makes floors slippery. If you must use wax, use non-skid floor wax.  Do not have throw rugs and other things on the floor that can make you trip. What can I do with my stairs?  Do not leave any items on the stairs.  Make sure that  there are handrails on both sides of the stairs and use them. Fix handrails that are broken or loose. Make sure that handrails are as long as the stairways.  Check any carpeting to make sure that it is firmly attached to the stairs. Fix any carpet that is loose or worn.  Avoid having throw rugs at the top or bottom of the stairs. If you do have throw rugs, attach them to the floor with carpet tape.  Make sure that you have a light switch at the top of the stairs and the bottom of the stairs. If you do not have them, ask someone to add them for you. What else can I do to help prevent falls?  Wear shoes that:  Do not have high heels.  Have rubber bottoms.  Are comfortable and fit you well.  Are closed at the toe. Do not wear sandals.  If you use a stepladder:  Make sure that it is fully opened. Do not climb a closed stepladder.  Make sure that both sides of the stepladder are locked into place.  Ask someone to hold it for you, if possible.  Clearly mark  and make sure that you can see:  Any grab bars or handrails.  First and last steps.  Where the edge of each step is.  Use tools that help you move around (mobility aids) if they are needed. These include:  Canes.  Walkers.  Scooters.  Crutches.  Turn on the lights when you go into a dark area. Replace any light bulbs as soon as they burn out.  Set up your furniture so you have a clear path. Avoid moving your furniture around.  If any of your floors are uneven, fix them.  If there are any pets around you, be aware of where they are.  Review your medicines with your doctor. Some medicines can make you feel dizzy. This can increase your chance of falling. Ask your doctor what other things that you can do to help prevent falls. This information is not intended to replace advice given to you by your health care provider. Make sure you discuss any questions you have with your health care provider. Document Released: 07/28/2009 Document Revised: 03/08/2016 Document Reviewed: 11/05/2014 Elsevier Interactive Patient Education  2017 [employer].

## 2018-05-01 NOTE — Progress Notes (Signed)
Subjective:   [name] is a 77 y.o. male who presents for Medicare Annual/Subsequent preventive examination at [place]  Last AWV-04/28/2017    Objective:    Vitals: BP 128/60 (BP Location: Right Arm, Patient Position: Sitting)   Pulse 75   Temp 98.2 F (36.8 C) (Oral)   Ht 5\' 7"  (1.702 m)   Wt 193 lb (87.5 kg)   BMI 30.23 kg/m   Body mass index is 30.23 kg/m.  Advanced Directives 05/01/2018 03/20/2018 02/03/2018 10/04/2017 09/09/2017 08/29/2017 08/19/2017  Does Patient Have a Medical Advance Directive? Yes Yes Yes Yes Yes Yes Yes  Type of Advance Directive Out of facility DNR (pink MOST or yellow form) Out of facility DNR (pink MOST or yellow form) Out of facility DNR (pink MOST or yellow form) Out of facility DNR (pink MOST or yellow form) Out of facility DNR (pink MOST or yellow form) Out of facility DNR (pink MOST or yellow form) Out of facility DNR (pink MOST or yellow form)  Does patient want to make changes to medical advance directive? No - Patient declined No - Patient declined No - Patient declined No - Patient declined No - Patient declined No - Patient declined No - Patient declined  Copy of [place] in Chart? - - - - - - -  Would patient like information on creating a medical advance directive? - - - No - Patient declined No - Patient declined No - Patient declined No - Patient declined  Pre-existing out of facility DNR order (yellow form or pink MOST form) Pink MOST form placed in chart (order not valid for inpatient use) Pink MOST form placed in chart (order not valid for inpatient use) Pink MOST form placed in chart (order not valid for inpatient use) Yellow form placed in chart (order not valid for inpatient use) Pink MOST form placed in chart (order not valid for inpatient use);Yellow form placed in chart (order not valid for inpatient use) Pink MOST form placed in chart (order not valid for inpatient use);Yellow form placed in  chart (order not valid for inpatient use) Pink MOST form placed in chart (order not valid for inpatient use);Yellow form placed in chart (order not valid for inpatient use)  Some encounter information is confidential and restricted. Go to Review Flowsheets activity to see all data.    Tobacco Social History   Tobacco Use  Smoking Status Former Smoker  . Packs/day: 2.00  . Years: 20.00  . Pack years: 40.00  . Types: Cigarettes  . Last attempt to quit: 10/16/1983  . Years since quitting: 34.5  Smokeless Tobacco Never Used     Counseling given: Not Answered   Clinical Intake:  Pre-visit preparation completed: No  Pain : No/denies pain     Nutritional Risks: None Diabetes: No  How often do you need to have someone help you when you read instructions, pamphlets, or other written materials from your doctor or pharmacy?: 2 - Rarely What is the last grade level you completed in school?: High School  Interpreter Needed?: No  Information entered by :: Tyson Dense, Rn  Past Medical History:  Diagnosis Date  . Arthritis    "maybe"  . 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 CHF (congestive heart failure) ([place])   . Depression   . 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 Hyperlipemia   . Hypertension   . Sleep apnea    "my machine [place]; got to get me another one" (09/01/2014)  . Stroke [facility]) 2009   left thalamic infarct, residual LUE weakness  . TIA (transient ischemic attack)    multiple in past, most recently in 2011  . Type II diabetes mellitus ([place])    Past Surgical History:  Procedure Laterality Date  . CARDIAC CATHETERIZATION  07/2010  . CRANIOTOMY  05/2008   for clipping of an incidental asymptomatic unruptured/notes 02/13/2011  . KNEE ARTHROSCOPY Left   . 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  SHOULDER ARTHROSCOPY W/ ROTATOR CUFF REPAIR Right    Family History  Problem Relation Age of Onset  . Stroke Mother   . Pneumonia Father   . Diabetes Sister   . Diabetes Brother   . Diabetes Paternal Aunt   . Colon cancer Neg Hx    Social History   Socioeconomic History  . Marital status: Married    Spouse name: [name]  . Number of children: 3  . Years of education: HS  . Highest education level: Not on file  Occupational History  . Occupation: disabled  Social Needs  . Financial resource strain: Not hard at all  . Food insecurity:    Worry: Never true    Inability: Never true  . Transportation needs:    Medical: No    Non-medical: No  Tobacco Use  . Smoking status: Former Smoker    Packs/day: 2.00    Years: 20.00    Pack years: 40.00    Types: Cigarettes    Last attempt to quit: 10/16/1983    Years since quitting: 34.5  . Smokeless tobacco: Never Used  Substance and Sexual Activity  . Alcohol use: No    Alcohol/week: 0.0 oz  . Drug use: No  . Sexual activity: Never  Lifestyle  . Physical activity:    Days per week: 0 days    Minutes per session: 0 min  . Stress: Not at all  Relationships  . Social connections:    Talks on phone: Three times a week    Gets together: Three times a week    Attends religious service: Never    Active member of club or organization: No    Attends meetings of clubs or organizations: Never    Relationship status: Married  Other Topics Concern  . Not on file  Social History Narrative  . Not on file    Outpatient Encounter Medications as of 05/01/2018  Medication Sig  . amLODipine (NORVASC) 10 MG tablet Take 10 mg by mouth every morning.   . carvedilol (COREG) 12.5 MG tablet Take 1 tablet (12.5 mg total) by mouth 2 (two) times daily.  . clopidogrel (PLAVIX) 75 MG tablet Take 75 mg by mouth at bedtime.  . diclofenac sodium (VOLTAREN) 1 % GEL Apply 4 g topically 4 (four) times daily. Apply to Bilateral Knees  . DOCUSATE SODIUM PO  Take 1 capsule by mouth every 12 (twelve) hours as needed.  . donepezil (ARICEPT) 10 MG tablet Take 1 tablet (10 mg total) by mouth at bedtime.  . hydrALAZINE (APRESOLINE) 50 MG tablet Take 75 mg by mouth 4 (four) times daily.   . insulin aspart (NOVOLOG) 100 UNIT/ML injection Inject 0-5 Units into the skin 3 (three) times daily before meals. Inject as per sliding scale: 0-59=0 call MD if  less than 60.  60-150=0; 151-300 = 3 units 301-400=5 units call MD if >400  . insulin glargine (LANTUS) 100 UNIT/ML injection Inject 12 Units into the skin daily.   Marland Kitchen lisinopril (PRINIVIL,ZESTRIL) 20 MG tablet Give 2 tablets (40 mg) by mouth daily  . memantine (NAMENDA XR) 28 MG CP24 24 hr capsule Take 1 capsule (28 mg total) by mouth daily.  . mirabegron ER (MYRBETRIQ) 50 MG TB24 tablet Take 50 mg by mouth daily.  . nitroGLYCERIN (NITROSTAT) 0.4 MG SL tablet Place 0.4 mg under the tongue every 5 (five) minutes as needed for chest pain.  . phenazopyridine (PYRIDIUM) 100 MG tablet Take 100 mg by mouth 3 (three) times daily as needed for pain.  Vladimir Faster Glycol-Propyl Glycol (SYSTANE) 0.4-0.3 % SOLN Apply 1 drop to eye 2 (two) times daily. Both eyes  . polyethylene glycol (MIRALAX / GLYCOLAX) packet Take 17 g by mouth daily.  . prednisoLONE acetate (PRED FORTE) 1 % ophthalmic suspension Place 1 drop into the left eye 4 (four) times daily.  Marland Kitchen senna (SENOKOT) 8.6 MG tablet Take 1 tablet by mouth every 12 (twelve) hours as needed for constipation.  . sertraline (ZOLOFT) 25 MG tablet Take 1 tablet (25 mg total) by mouth daily.  . simvastatin (ZOCOR) 20 MG tablet Take 1 tablet (20 mg total) by mouth at bedtime.  . tamsulosin (FLOMAX) 0.4 MG CAPS capsule Take 0.4 mg by mouth at bedtime.  Marland Kitchen tiZANidine (ZANAFLEX) 2 MG tablet Take 2 mg by mouth 2 (two) times daily.  Marland Kitchen torsemide (DEMADEX) 10 MG tablet Take 10 mg by mouth daily.  . traMADol (ULTRAM) 50 MG tablet Take 1 tablet (50 mg total) by mouth every 6 (six) hours as  needed for moderate pain or severe pain.   No facility-administered encounter medications on file as of 05/01/2018.     Activities of Daily Living In your present state of health, do you have any difficulty performing the following activities: 05/01/2018  Hearing? N  Vision? N  Difficulty concentrating or making decisions? Y  Walking or climbing stairs? Y  Dressing or bathing? Y  Doing errands, shopping? Y  Preparing Food and eating ? Y  Using the Toilet? Y  In the past six months, have you accidently leaked urine? Y  Do you have problems with loss of bowel control? Y  Managing your Medications? Y  Managing your Finances? Y  Housekeeping or managing your Housekeeping? Y  Some recent data might be hidden    Patient Care Team: Gildardo Cranker, DO as PCP - General (Internal Medicine) Webb Laws, [place] as Referring Physician (Optometry) Bjorn Loser, MD as Consulting Physician (Urology) Hayden Pedro, MD as Consulting Physician (Ophthalmology) Borum, Jaci Standard, MD as Referring Physician (Internal Medicine) Gerlene Fee, NP as Nurse Practitioner ([place]) [facility] [place] ([place])   Assessment:   This is a routine wellness examination for [place].  Exercise Activities and Dietary recommendations Current Exercise Habits: The patient does not participate in regular exercise at present, Exercise limited by: orthopedic condition(s)  Goals    . Blood Pressure < 140/90    . HEMOGLOBIN A1C < 7.0    . Prevent Falls       Fall Risk Fall Risk  05/01/2018 05/22/2017 04/18/2017 09/12/2016 05/24/2016  Falls in the past year? No Yes No Yes Yes  Number falls in past yr: - 1 - 2 or more 2 or more  Comment - - - - -  Injury  with Fall? - No - Yes Yes  Comment - - - - -  Risk Factor Category  - - - High Fall Risk High Fall Risk  Risk for fall due to : - History of fall(s);Impaired balance/gait;Impaired mobility;Medication side effect - History  of fall(s);Impaired balance/gait;Impaired mobility History of fall(s);Impaired balance/gait;Impaired mobility  Risk for fall due to: Comment - - - - -  Follow up - Falls evaluation completed;Falls prevention discussed;Education provided;Follow up appointment - Falls prevention discussed Education provided;Falls prevention discussed   Is the patient's home free of loose throw rugs in walkways, pet beds, electrical cords, etc?   yes      Grab bars in the bathroom? yes      Handrails on the stairs?   yes      Adequate lighting?   yes   Depression Screen PHQ 2/9 Scores 05/01/2018 04/18/2017 09/12/2016 05/24/2016  PHQ - 2 Score 0 0 0 0  PHQ- 9 Score - - - -    Cognitive Function MMSE - Mini Mental State Exam 12/01/2014  Orientation to time 3  Orientation to Place 5  Registration 3  Attention/ Calculation 1  Recall 3  Language- name 2 objects 2  Language- repeat 1  Language- follow 3 step command 3  Language- read & follow direction 1  Write a sentence 1  Copy design 0  Total score 23     6CIT Screen 05/01/2018 04/18/2017  What Year? 0 points 0 points  What month? 0 points 0 points  What time? 0 points 3 points  Count back from 20 0 points 0 points  Months in reverse 4 points 4 points  Repeat phrase 10 points 10 points  Total Score 14 17    Immunization History  Administered Date(s) Administered  . Influenza Split 06/25/2012  . Influenza Whole 07/18/2007, 08/18/2008, 08/09/2009, 06/14/2010, 06/20/2011  . Influenza,inj,Quad PF,6+ Mos 06/24/2013, 06/08/2014  . Influenza-Unspecified 08/20/2016, 07/30/2017  . PPD Test 04/25/2016, 06/29/2016, 07/06/2016  . Pneumococcal Conjugate-13 01/06/2015  . Pneumococcal Polysaccharide-23 05/30/2008  . Tdap 06/20/2011    Qualifies for Shingles Vaccine? Not in past records  Screening Tests Health Maintenance  Topic Date Due  . OPHTHALMOLOGY EXAM  04/12/2016  . COLONOSCOPY  08/29/2018 (Originally 08/29/2017)  . HEMOGLOBIN A1C  05/06/2018    . INFLUENZA VACCINE  05/15/2018  . LIPID PANEL  11/07/2018  . FOOT EXAM  03/06/2019  . TETANUS/TDAP  06/19/2021  . PNA vac Low Risk Adult  Completed   Cancer Screenings: Lung: Low Dose CT Chest recommended if Age 25-80 years, 30 pack-year currently smoking OR have quit w/in 15years. Patient does not qualify. Colorectal: up to date  Additional Screenings:  Hepatitis C Screening:declined  Diabetic eye exam due: ordered    Plan:    I have personally reviewed and addressed the Medicare Annual Wellness questionnaire and have noted the following in the patient's chart:  A. Medical and social history B. Use of alcohol, tobacco or illicit drugs  C. Current medications and supplements D. Functional ability and status E.  Nutritional status F.  Physical activity G. Advance directives H. List of other physicians I.  Hospitalizations, surgeries, and ER visits in previous 12 months J.  [place] to include hearing, vision, cognitive, depression L. Referrals and appointments - none  In addition, I have reviewed and discussed with patient certain preventive protocols, quality metrics, and best practice recommendations. A written personalized care plan for preventive services as well as general preventive health recommendations were  provided to patient.  See attached scanned questionnaire for additional information.   Signed,   Tyson Dense, RN Nurse Health Advisor  Patient Concerns: none

## 2018-05-31 ENCOUNTER — Encounter: Payer: Self-pay | Admitting: Internal Medicine

## 2018-06-04 ENCOUNTER — Encounter: Payer: Self-pay | Admitting: Internal Medicine

## 2018-06-07 LAB — CBC AND DIFFERENTIAL
HEMATOCRIT: 37 — AB (ref 41–53)
HEMOGLOBIN: 12 — AB (ref 13.5–17.5)
NEUTROS ABS: 4
PLATELETS: 194 (ref 150–399)
WBC: 6.4

## 2018-06-07 LAB — HEPATIC FUNCTION PANEL
ALK PHOS: 86 (ref 25–125)
ALT: 8 — AB (ref 10–40)
AST: 9 — AB (ref 14–40)
Bilirubin, Total: 0.4

## 2018-06-07 LAB — BASIC METABOLIC PANEL
BUN: 19 (ref 4–21)
Creatinine: 0.9 (ref 0.6–1.3)
Glucose: 143
Potassium: 3.6 (ref 3.4–5.3)
SODIUM: 148 — AB (ref 137–147)

## 2018-06-07 LAB — LIPID PANEL
Cholesterol: 153 (ref 0–200)
HDL: 39 (ref 35–70)
LDL CALC: 95
TRIGLYCERIDES: 94 (ref 40–160)

## 2018-06-07 LAB — HEMOGLOBIN A1C: Hemoglobin A1C: 7.2

## 2018-06-07 LAB — TSH: TSH: 1.76 (ref 0.41–5.90)

## 2018-06-07 LAB — VITAMIN D 25 HYDROXY (VIT D DEFICIENCY, FRACTURES): Vit D, 25-Hydroxy: 10.12

## 2018-06-09 ENCOUNTER — Non-Acute Institutional Stay (SKILLED_NURSING_FACILITY): Payer: Medicare Other | Admitting: Internal Medicine

## 2018-06-09 ENCOUNTER — Encounter: Payer: Self-pay | Admitting: Internal Medicine

## 2018-06-09 DIAGNOSIS — I6930 Unspecified sequelae of cerebral infarction: Secondary | ICD-10-CM

## 2018-06-09 DIAGNOSIS — N319 Neuromuscular dysfunction of bladder, unspecified: Secondary | ICD-10-CM

## 2018-06-09 DIAGNOSIS — F0150 Vascular dementia without behavioral disturbance: Secondary | ICD-10-CM

## 2018-06-09 DIAGNOSIS — E1149 Type 2 diabetes mellitus with other diabetic neurological complication: Secondary | ICD-10-CM

## 2018-06-09 DIAGNOSIS — E785 Hyperlipidemia, unspecified: Secondary | ICD-10-CM

## 2018-06-09 DIAGNOSIS — E1169 Type 2 diabetes mellitus with other specified complication: Secondary | ICD-10-CM

## 2018-06-09 DIAGNOSIS — I10 Essential (primary) hypertension: Secondary | ICD-10-CM | POA: Diagnosis not present

## 2018-06-09 NOTE — Progress Notes (Signed)
Patient ID: [name], male   DOB: July 01, 1941, 77 y.o.   MRN: [id number]   Location:  [facility] Room Number: 397 A Place of Service:  SNF (31) Provider:  Merton, Irwin, DO  Patient Care Team: Gildardo Cranker, DO as PCP - General (Internal Medicine) Webb Laws, [place] as Referring Physician (Optometry) Bjorn Loser, MD as Consulting Physician (Urology) Hayden Pedro, MD as Consulting Physician (Ophthalmology) Borum, Jaci Standard, MD as Referring Physician (Internal Medicine) Gerlene Fee, NP as Nurse Practitioner ([place]) [facility], [place] ([place])  Extended Emergency Contact Information Primary Emergency Contact: Lampley,[name] Address: [address]          [place] [address] [place] Phone: [phone] Relation: Spouse Secondary Emergency Contact: Eltzroth,[name]  [place] Relation: Daughter  Code Status:  Full Code Goals of care: Advanced Directive information Advanced Directives 06/09/2018  Does Patient Have a Medical Advance Directive? Yes  Type of Advance Directive Out of facility DNR (pink MOST or yellow form)  Does patient want to make changes to medical advance directive? No - Patient declined  Copy of [place] in Chart? -  Would patient like information on creating a medical advance directive? -  Pre-existing out of facility DNR order (yellow form or pink MOST form) Pink MOST form placed in chart (order not valid for inpatient use)  Some encounter information is confidential and restricted. Go to Review Flowsheets activity to see all data.     Chief Complaint  Patient presents with  . Medical Management of Chronic Issues    [facility]    HPI:  Pt is a 77 y.o. male seen today for medical management of chronic diseases.  He has no concerns today. No nursing issues. No recent falls. Appetite ok and sleeps  well. He is a poor historian due to dementia. Hx obtained form chart. CBGs 100-150s; occasionally > 200. No low BS reactions.   Neurogenic bladder - unchanged; he has a suprapubic cath; followed by urology at [facility]. Stable on flomax 0.4 mg daily and myrbetriq 50 mg daily   Adjustment disorder with depression - controlled on zoloft 25 mg daily. He does benefit from this regiman   Lower extremity edema - stable on demadex 10 mg daily. Na 146  Osteoarthritis bilateral knees - pain controlled on voltaren gel 4 gm to both knees four times daily  Constipation - controlled on miralax daily and colace and senna twice daily as needed   Hx CVA with left hemiparesis - unchanged; takes plavix 75 mg daily  Hypertension - controlled on coreg 12.5 mg twice daily; lisinopril 40 mg daily; norvasc 5 mg daily; hydralazine 75 mg four times  daily   DM - stable. A1c 7.7%. He takes novolog SSI: 60-150=3units; 151-400=5 units; lantus 12 units nightly. No low BS reactions. No numbness/tingling. LDL 55  Vascular dementia - unchanged on aricept 10 mg nightly and namenda xr 28 mg daily. Albumin 3.6. BIMS score 13.   Dyslipidemia - controlled on zocor 20 mg daily. LDL 55   Past Medical History:  Diagnosis Date  . Arthritis    "maybe"  . 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 CHF (congestive heart failure) ([place])   . Depression   . 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 Hyperlipemia   . Hypertension   . Sleep apnea    "my machine [place]; got to get me another one" (09/01/2014)  . Stroke [facility]) 2009   left thalamic infarct, residual LUE weakness  . TIA (transient ischemic attack)    multiple in past, most recently in 2011  . Type II diabetes mellitus ([place])    Past Surgical History:  Procedure Laterality Date  . CARDIAC CATHETERIZATION  07/2010    . CRANIOTOMY  05/2008   for clipping of an incidental asymptomatic unruptured/notes 02/13/2011  . KNEE ARTHROSCOPY Left   . 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 SHOULDER ARTHROSCOPY W/ ROTATOR CUFF REPAIR Right     Allergies  Allergen Reactions  . Pioglitazone Other (See Comments)    Edema   . Rosiglitazone Maleate Other (See Comments)    edema    Outpatient Encounter Medications as of 06/09/2018  Medication Sig  . amLODipine (NORVASC) 10 MG tablet Take 10 mg by mouth every morning.   . carvedilol (COREG) 12.5 MG tablet Take 1 tablet (12.5 mg total) by mouth 2 (two) times daily.  . clopidogrel (PLAVIX) 75 MG tablet Take 75 mg by mouth at bedtime.  . diclofenac sodium (VOLTAREN) 1 % GEL Apply 4 g topically 4 (four) times daily. Apply to Bilateral Knees  . DOCUSATE SODIUM PO Take 1 capsule by mouth every 12 (twelve) hours as needed.  . donepezil (ARICEPT) 10 MG tablet Take 1 tablet (10 mg total) by mouth at bedtime.  . hydrALAZINE (APRESOLINE) 50 MG tablet Take 75 mg by mouth 4 (four) times daily.   . insulin aspart (NOVOLOG) 100 UNIT/ML injection Inject 0-5 Units into the skin 3 (three) times daily before meals. Inject as per sliding scale: 0-59=0 call MD if less than 60.  60-150=0; 151-300 = 3 units 301-400=5 units call MD if >400  . insulin glargine (LANTUS) 100 UNIT/ML injection Inject 12 Units into the skin daily.   Marland Kitchen lisinopril (PRINIVIL,ZESTRIL) 20 MG tablet Give 2 tablets (40 mg) by mouth daily  . memantine (NAMENDA XR) 28 MG CP24 24 hr capsule Take 1 capsule (28 mg total) by mouth daily.  . mirabegron ER (MYRBETRIQ) 50 MG TB24 tablet Take 50 mg by mouth daily.  . nitroGLYCERIN (NITROSTAT) 0.4 MG SL tablet Place 0.4 mg under the tongue every 5 (five) minutes as needed for chest pain.  . Nutritional Supplements (NUTRITIONAL SUPPLEMENT PO) Low Concentrated Sweets Diet - regular texture, Regular / thin consistency  . Polyethyl Glycol-Propyl Glycol (SYSTANE) 0.4-0.3 % SOLN  Apply 1 drop to eye 2 (two) times daily. Both eyes  . polyethylene glycol (MIRALAX / GLYCOLAX) packet Take 17 g by mouth daily.  . prednisoLONE acetate (PRED FORTE) 1 % ophthalmic suspension Place 1 drop into the left eye 4 (four) times daily.  Marland Kitchen senna (SENOKOT) 8.6 MG tablet Take 1 tablet by mouth every 12 (twelve) hours as needed for constipation.  . SERTRALINE HCL PO Take 12.5 mg by mouth daily.  . simvastatin (ZOCOR) 20 MG tablet Take 1 tablet (20 mg total) by mouth at bedtime.  . tamsulosin (FLOMAX) 0.4 MG CAPS capsule Take 0.4 mg by mouth at bedtime.  Marland Kitchen tiZANidine (ZANAFLEX) 2 MG tablet Take 2 mg by mouth 2 (two) times daily.  Marland Kitchen torsemide (DEMADEX) 10 MG tablet Take 10 mg by mouth daily.  . traMADol (ULTRAM) 50 MG tablet Take 1 tablet (50 mg total) by mouth every  6 (six) hours as needed for moderate pain or severe pain.  . [DISCONTINUED] phenazopyridine (PYRIDIUM) 100 MG tablet Take 100 mg by mouth 3 (three) times daily as needed for pain.  . [DISCONTINUED] sertraline (ZOLOFT) 25 MG tablet Take 1 tablet (25 mg total) by mouth daily. (Patient not taking: Reported on 06/09/2018)   No facility-administered encounter medications on file as of 06/09/2018.     Review of Systems  Unable to perform ROS: Dementia    Immunization History  Administered Date(s) Administered  . Influenza Split 06/25/2012  . Influenza Whole 07/18/2007, 08/18/2008, 08/09/2009, 06/14/2010, 06/20/2011  . Influenza,inj,Quad PF,6+ Mos 06/24/2013, 06/08/2014  . Influenza-Unspecified 08/20/2016, 07/30/2017  . PPD Test 04/25/2016, 06/29/2016, 07/06/2016  . Pneumococcal Conjugate-13 01/06/2015  . Pneumococcal Polysaccharide-23 05/30/2008  . Tdap 06/20/2011   Pertinent  Health Maintenance Due  Topic Date Due  . HEMOGLOBIN A1C  07/10/2018 (Originally 05/06/2018)  . COLONOSCOPY  08/29/2018 (Originally 08/29/2017)  . INFLUENZA VACCINE  09/09/2018 (Originally 05/15/2018)  . LIPID PANEL  11/07/2018  . OPHTHALMOLOGY EXAM   05/21/2019  . FOOT EXAM  05/23/2019  . PNA vac Low Risk Adult  Completed   Fall Risk  05/01/2018 05/22/2017 04/18/2017 09/12/2016 05/24/2016  Falls in the past year? No Yes No Yes Yes  Number falls in past yr: - 1 - 2 or more 2 or more  Comment - - - - -  Injury with Fall? - No - Yes Yes  Comment - - - - -  Risk Factor Category  - - - High Fall Risk High Fall Risk  Risk for fall due to : - History of fall(s);Impaired balance/gait;Impaired mobility;Medication side effect - History of fall(s);Impaired balance/gait;Impaired mobility History of fall(s);Impaired balance/gait;Impaired mobility  Risk for fall due to: Comment - - - - -  Follow up - Falls evaluation completed;Falls prevention discussed;Education provided;Follow up appointment - Falls prevention discussed Education provided;Falls prevention discussed   Functional Status Survey:    Vitals:   06/09/18 1214  BP: (!) 158/70  Pulse: (!) 56  Resp: 18  Temp: 97.9 F (36.6 C)  SpO2: 95%  Weight: 191 lb 11.2 oz (87 kg)  Height: 5\' 7"  (1.702 m)   Body mass index is 30.02 kg/m. Physical Exam  Constitutional: He appears well-developed and well-nourished.  Sitting in w/c in NAD  HENT:  Mouth/Throat: Oropharynx is clear and moist.  MMM; no oral thrush  Eyes: Pupils are equal, round, and reactive to light. No scleral icterus.  Neck: Neck supple. Carotid bruit is not present. No thyromegaly present.  Cardiovascular: Normal rate, regular rhythm and intact distal pulses. Exam reveals no gallop and no friction rub.  Murmur (1/6 SEM) heard. +1 pitting LE edema b/l. TED stockings intact. No calf TTP  Pulmonary/Chest: Effort normal and breath sounds normal. He has no wheezes. He has no rales. He exhibits no tenderness.  Abdominal: Soft. Bowel sounds are normal. He exhibits no distension, no abdominal bruit, no pulsatile midline mass and no mass. There is no hepatomegaly. There is no tenderness. There is no rebound and no guarding.    Genitourinary:  Genitourinary Comments: Foley cath intact and DTG clear yellow urine  Lymphadenopathy:    He has no cervical adenopathy.  Neurological: He is alert. He has normal reflexes.  Skin: Skin is warm and dry. No rash noted.  Psychiatric: He has a normal mood and affect. His behavior is normal. Thought content normal.    Labs reviewed: Recent Labs    06/20/17 08/01/17 11/07/17  NA 141 148* 146  K 3.8 3.7 3.9  BUN 19 21 18   CREATININE 0.9 1.0 0.9   Recent Labs    08/01/17 11/07/17  AST 11* 13*  ALT 15 13  ALKPHOS 100 84   Recent Labs    06/20/17 08/01/17 11/07/17  WBC 10.9 7.6 5.5  NEUTROABS 8 5 3   HGB 11.1* 11.5* 12.4*  HCT 36* 37* 41  PLT 274 280 189   Lab Results  Component Value Date   TSH 1.50 08/01/2017   Lab Results  Component Value Date   HGBA1C 7.0 02/04/2018   Lab Results  Component Value Date   CHOL 106 11/07/2017   HDL 30 (A) 11/07/2017   LDLCALC 55 11/07/2017   TRIG 107 11/07/2017   CHOLHDL 2.2 06/08/2014    Significant Diagnostic Results in last 30 days:  No results found.  Assessment/Plan   ICD-10-CM   1. Type II diabetes mellitus with neurological manifestations ([place]) E11.49   2. History of CVA with residual deficit I69.30   3. Essential hypertension I10   4. Neurogenic bladder N31.9   5. Hyperlipidemia associated with type 2 diabetes mellitus (HCC) E11.69    E78.5   6. Vascular dementia without behavioral disturbance F01.50    Cont current meds as ordered  PT/OT/ST as indicated  Foley cath care as indicated  F/u with specialists as indicated  [facility] NP to follow  Will follow  Labs/tests ordered: cmp, lipid panel, a1c, urine microalbumin/cr ratio   Nai Dasch S. Perlie Gold  [facility] and Adult Medicine [address] [place], [place] [address] [phone] Cell (Monday-Friday 8 AM - 5 PM) [phone] After 5 PM and follow prompts

## 2018-06-17 ENCOUNTER — Encounter (INDEPENDENT_AMBULATORY_CARE_PROVIDER_SITE_OTHER): Payer: Medicare Other | Admitting: Ophthalmology

## 2018-06-17 DIAGNOSIS — H59032 Cystoid macular edema following cataract surgery, left eye: Secondary | ICD-10-CM | POA: Diagnosis not present

## 2018-06-17 DIAGNOSIS — I10 Essential (primary) hypertension: Secondary | ICD-10-CM

## 2018-06-17 DIAGNOSIS — H35033 Hypertensive retinopathy, bilateral: Secondary | ICD-10-CM

## 2018-06-17 DIAGNOSIS — E113312 Type 2 diabetes mellitus with moderate nonproliferative diabetic retinopathy with macular edema, left eye: Secondary | ICD-10-CM

## 2018-06-17 DIAGNOSIS — E11311 Type 2 diabetes mellitus with unspecified diabetic retinopathy with macular edema: Secondary | ICD-10-CM

## 2018-06-17 DIAGNOSIS — E113391 Type 2 diabetes mellitus with moderate nonproliferative diabetic retinopathy without macular edema, right eye: Secondary | ICD-10-CM

## 2018-06-17 DIAGNOSIS — H43813 Vitreous degeneration, bilateral: Secondary | ICD-10-CM

## 2018-08-04 ENCOUNTER — Non-Acute Institutional Stay (SKILLED_NURSING_FACILITY): Payer: Medicare Other | Admitting: Internal Medicine

## 2018-08-04 ENCOUNTER — Encounter: Payer: Self-pay | Admitting: Internal Medicine

## 2018-08-04 DIAGNOSIS — E1169 Type 2 diabetes mellitus with other specified complication: Secondary | ICD-10-CM

## 2018-08-04 DIAGNOSIS — I6930 Unspecified sequelae of cerebral infarction: Secondary | ICD-10-CM

## 2018-08-04 DIAGNOSIS — E1149 Type 2 diabetes mellitus with other diabetic neurological complication: Secondary | ICD-10-CM | POA: Diagnosis not present

## 2018-08-04 DIAGNOSIS — F0150 Vascular dementia without behavioral disturbance: Secondary | ICD-10-CM

## 2018-08-04 DIAGNOSIS — E559 Vitamin D deficiency, unspecified: Secondary | ICD-10-CM

## 2018-08-04 DIAGNOSIS — I10 Essential (primary) hypertension: Secondary | ICD-10-CM | POA: Diagnosis not present

## 2018-08-04 DIAGNOSIS — E785 Hyperlipidemia, unspecified: Secondary | ICD-10-CM

## 2018-08-04 DIAGNOSIS — N319 Neuromuscular dysfunction of bladder, unspecified: Secondary | ICD-10-CM

## 2018-08-04 NOTE — Progress Notes (Signed)
Patient ID: [name], male   DOB: [birth year], 77 y.o.   MRN: [id number]   Location:  [facility] Room Number: 001 A Place of Service:  SNF (31) Provider:  [place], [place], DO  Patient Care Team: Gildardo Cranker, DO as PCP - General (Internal Medicine) Webb Laws, [place] as Referring Physician (Optometry) Bjorn Loser, MD as Consulting Physician (Urology) Hayden Pedro, MD as Consulting Physician (Ophthalmology) Borum, Jaci Standard, MD as Referring Physician (Internal Medicine) Gerlene Fee, NP as Nurse Practitioner ([employer]) [facility], [place] ([place])  Extended Emergency Contact Information Primary Emergency Contact: Beaston,[name] Address: [address]          [place] [address] [place] Phone: [phone] Relation: Spouse Secondary Emergency Contact: Yurkovich,[name]  [place] Relation: Daughter  Code Status:  Full Code Goals of care: Advanced Directive information Advanced Directives 08/04/2018  Does Patient Have a Medical Advance Directive? Yes  Type of Advance Directive Out of facility DNR (pink MOST or yellow form)  Does patient want to make changes to medical advance directive? No - Patient declined  Copy of [place] in Chart? -  Would patient like information on creating a medical advance directive? -  Pre-existing out of facility DNR order (yellow form or pink MOST form) Pink MOST form placed in chart (order not valid for inpatient use)  Some encounter information is confidential and restricted. Go to Review Flowsheets activity to see all data.     Chief Complaint  Patient presents with  . Medical Management of Chronic Issues    [facility]    HPI:  Pt is a 77 y.o. male seen today for medical management of chronic diseases.  He has no concerns today. No nursing issues. No falls. He is a poor historian due to  dementia. Hx obtained from chart.  Neurogenic bladder - unchanged; he has a suprapubic cath; followed by urology at [facility]. Stable on flomax 0.4 mg daily and myrbetriq 50 mg daily   Adjustment disorder with depression - controlled on zoloft 25 mg daily. He does benefit from this regiman   Lower extremity edema - stable on demadex 10 mg daily. Na 146  Osteoarthritis bilateral knees - pain controlled on voltaren gel 4 gm to both knees four times daily  Constipation - controlled on miralax daily and colace and senna twice daily as needed   Hx CVA with left hemiparesis - unchanged; takes plavix 75 mg daily  Hypertension - controlled on coreg 12.5 mg twice daily; lisinopril 40 mg daily; norvasc 5 mg daily; hydralazine 75 mg four times  daily ; Hgb 12  DM - stable. A1c 7.2% (prev 7.7%). He takes novolog SSI: 60-150=3units; 151-400=5 units; lantus 12 units nightly. No low BS reactions. No numbness/tingling. LDL 95  Vascular dementia - unchanged on aricept 10 mg nightly and namenda xr 28 mg daily. Albumin 3.6. BIMS score 13.   Dyslipidemia - controlled on zocor 20 mg daily. LDL 95; TSH 1.76  Anemia - chronic; Hgb 12  Vitamin D Deficiency - Vit D 25OH level 10.12  Past Medical History:  Diagnosis Date  . Arthritis    "maybe"  . 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 CHF (congestive heart failure) ([place])   . Depression   . 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 Hyperlipemia   . Hypertension   . Sleep apnea    "my machine [place]; got to get me another one" (09/01/2014)  . Stroke [facility]) 2009   left thalamic infarct, residual LUE weakness  . TIA (transient ischemic attack)    multiple in past, most recently in 2011  . Type II diabetes mellitus ([place])    Past Surgical History:  Procedure Laterality Date  . CARDIAC CATHETERIZATION   07/2010  . CRANIOTOMY  05/2008   for clipping of an incidental asymptomatic unruptured/notes 02/13/2011  . KNEE ARTHROSCOPY Left   . 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 SHOULDER ARTHROSCOPY W/ ROTATOR CUFF REPAIR Right     Allergies  Allergen Reactions  . Pioglitazone Other (See Comments)    Edema   . Rosiglitazone Maleate Other (See Comments)    edema    Outpatient Encounter Medications as of 08/04/2018  Medication Sig  . amLODipine (NORVASC) 10 MG tablet Take 10 mg by mouth every morning.   . carvedilol (COREG) 12.5 MG tablet Take 1 tablet (12.5 mg total) by mouth 2 (two) times daily.  . clopidogrel (PLAVIX) 75 MG tablet Take 75 mg by mouth at bedtime.  . diclofenac sodium (VOLTAREN) 1 % GEL Apply 4 g topically 4 (four) times daily. Apply to Bilateral Knees  . DOCUSATE SODIUM PO Take 1 capsule by mouth every 12 (twelve) hours as needed.  . donepezil (ARICEPT) 10 MG tablet Take 1 tablet (10 mg total) by mouth at bedtime.  . hydrALAZINE (APRESOLINE) 50 MG tablet Take 75 mg by mouth 4 (four) times daily.   . insulin aspart (NOVOLOG) 100 UNIT/ML injection Inject 0-5 Units into the skin 3 (three) times daily before meals. Inject as per sliding scale: 0-59=0 call MD if less than 60.  60-150=0; 151-300 = 3 units 301-400=5 units call MD if >400  . insulin glargine (LANTUS) 100 UNIT/ML injection Inject 12 Units into the skin daily.   Marland Kitchen lisinopril (PRINIVIL,ZESTRIL) 20 MG tablet Give 2 tablets (40 mg) by mouth daily  . memantine (NAMENDA XR) 28 MG CP24 24 hr capsule Take 1 capsule (28 mg total) by mouth daily.  . mirabegron ER (MYRBETRIQ) 50 MG TB24 tablet Take 50 mg by mouth daily.  . nitroGLYCERIN (NITROSTAT) 0.4 MG SL tablet Place 0.4 mg under the tongue every 5 (five) minutes as needed for chest pain.  . Nutritional Supplements (NUTRITIONAL SUPPLEMENT PO) Low Concentrated Sweets Diet - regular texture, Regular / thin consistency  . Polyethyl Glycol-Propyl Glycol (SYSTANE)  0.4-0.3 % SOLN Apply 1 drop to eye 2 (two) times daily. Both eyes  . polyethylene glycol (MIRALAX / GLYCOLAX) packet Take 17 g by mouth daily.  . prednisoLONE acetate (PRED FORTE) 1 % ophthalmic suspension Place 1 drop into the left eye 4 (four) times daily.  Marland Kitchen senna (SENOKOT) 8.6 MG tablet Take 1 tablet by mouth every 12 (twelve) hours as needed for constipation.  . sertraline (ZOLOFT) 50 MG tablet Take 50 mg by mouth daily.   . simvastatin (ZOCOR) 20 MG tablet Take 1 tablet (20 mg total) by mouth at bedtime.  . tamsulosin (FLOMAX) 0.4 MG CAPS capsule Take 0.4 mg by mouth at bedtime.  Marland Kitchen tiZANidine (ZANAFLEX) 2 MG tablet Take 2 mg by mouth 2 (two) times daily.  Marland Kitchen torsemide (DEMADEX) 10 MG tablet Take 10 mg by mouth daily.  . traMADol (ULTRAM) 50 MG tablet Take 1 tablet (  50 mg total) by mouth every 6 (six) hours as needed for moderate pain or severe pain.   No facility-administered encounter medications on file as of 08/04/2018.     Review of Systems  Immunization History  Administered Date(s) Administered  . Influenza Split 06/25/2012  . Influenza Whole 07/18/2007, 08/18/2008, 08/09/2009, 06/14/2010, 06/20/2011  . Influenza,inj,Quad PF,6+ Mos 06/24/2013, 06/08/2014  . Influenza-Unspecified 08/20/2016, 07/30/2017  . PPD Test 04/25/2016, 06/29/2016, 07/06/2016  . Pneumococcal Conjugate-13 01/06/2015  . Pneumococcal Polysaccharide-23 05/30/2008  . Tdap 06/20/2011   Pertinent  Health Maintenance Due  Topic Date Due  . COLONOSCOPY  08/29/2018 (Originally 08/29/2017)  . INFLUENZA VACCINE  09/09/2018 (Originally 05/15/2018)  . HEMOGLOBIN A1C  09/07/2018  . OPHTHALMOLOGY EXAM  05/21/2019  . FOOT EXAM  05/23/2019  . LIPID PANEL  06/08/2019  . PNA vac Low Risk Adult  Completed   Fall Risk  05/01/2018 05/22/2017 04/18/2017 09/12/2016 05/24/2016  Falls in the past year? No Yes No Yes Yes  Number falls in past yr: - 1 - 2 or more 2 or more  Comment - - - - -  Injury with Fall? - No - Yes Yes    Comment - - - - -  Risk Factor Category  - - - High Fall Risk High Fall Risk  Risk for fall due to : - History of fall(s);Impaired balance/gait;Impaired mobility;Medication side effect - History of fall(s);Impaired balance/gait;Impaired mobility History of fall(s);Impaired balance/gait;Impaired mobility  Risk for fall due to: Comment - - - - -  Follow up - Falls evaluation completed;Falls prevention discussed;Education provided;Follow up appointment - Falls prevention discussed Education provided;Falls prevention discussed   Functional Status Survey:    Vitals:   08/04/18 1257  Weight: 192 lb 3.2 oz (87.2 kg)  Height: 5\' 7"  (1.702 m)   Body mass index is 30.1 kg/m. Physical Exam  Constitutional: He appears well-developed and well-nourished.  Sitting in w/c in NAD  HENT:  Mouth/Throat: Oropharynx is clear and moist.  MMM; no oral thrush  Eyes: Pupils are equal, round, and reactive to light. No scleral icterus.  Neck: Neck supple. Carotid bruit is not present. No thyromegaly present.  Cardiovascular: Normal rate, regular rhythm and intact distal pulses. Exam reveals no gallop and no friction rub.  Murmur (1/6 SEM) heard. +1 pitting LE edema b/l. TED stockings intact. No calf TTP  Pulmonary/Chest: Effort normal and breath sounds normal. No stridor. No respiratory distress. He has no wheezes. He has no rales. He exhibits no tenderness.  Abdominal: Soft. Bowel sounds are normal. He exhibits no distension, no abdominal bruit, no pulsatile midline mass and no mass. There is no hepatomegaly. There is no tenderness. There is no rebound and no guarding.  Obese; suprapubic cath  Genitourinary:  Genitourinary Comments: Foley cath intact and DTG clear yellow urine with sediment noted  Musculoskeletal: He exhibits edema.  Lymphadenopathy:    He has no cervical adenopathy.  Neurological: He is alert. He has normal reflexes.  Skin: Skin is warm and dry. No rash noted.  Psychiatric: He has a  normal mood and affect. His behavior is normal. Thought content normal.    Labs reviewed: Recent Labs    11/07/17 06/07/18  NA 146 148*  K 3.9 3.6  BUN 18 19  CREATININE 0.9 0.9   Recent Labs    11/07/17 06/07/18  AST 13* 9*  ALT 13 8*  ALKPHOS 84 86   Recent Labs    11/07/17 06/07/18  WBC 5.5 6.4  NEUTROABS  3 4  HGB 12.4* 12.0*  HCT 41 37*  PLT 189 194   Lab Results  Component Value Date   TSH 1.76 06/07/2018   Lab Results  Component Value Date   HGBA1C 7.2 06/07/2018   Lab Results  Component Value Date   CHOL 153 06/07/2018   HDL 39 06/07/2018   LDLCALC 95 06/07/2018   TRIG 94 06/07/2018   CHOLHDL 2.2 06/08/2014    Significant Diagnostic Results in last 30 days:  No results found.  Assessment/Plan   ICD-10-CM   1. Vitamin D deficiency E55.9   2. Type II diabetes mellitus with neurological manifestations ([place]) E11.49   3. Essential hypertension I10   4. History of CVA with residual deficit I69.30   5. Vascular dementia without behavioral disturbance (HCC) F01.50   6. Hyperlipidemia associated with type 2 diabetes mellitus (HCC) E11.69    E78.5   7. Neurogenic bladder N31.9     START VITAMIN D 50K UNITS WEEKLY X 12 WEEKS THEN VITAMIN D3 TAKE 2000 UNITS DAILY  Cont other meds as ordered  PT/OT/ST as indicated  F/u with specialists as scheduled  [facility] NP to follow  Will follow  Labs/tests ordered: vitamin d 25oh level in 3 mos   Erin. Perlie Gold  [facility] and Adult Medicine [address] [place], [place] [address] [phone] Cell (Monday-Friday 8 AM - 5 PM) [phone] After 5 PM and follow prompts

## 2018-08-06 ENCOUNTER — Encounter: Payer: Self-pay | Admitting: Internal Medicine

## 2018-11-17 ENCOUNTER — Encounter (INDEPENDENT_AMBULATORY_CARE_PROVIDER_SITE_OTHER): Payer: Medicare Other | Admitting: Ophthalmology

## 2018-11-24 ENCOUNTER — Encounter (INDEPENDENT_AMBULATORY_CARE_PROVIDER_SITE_OTHER): Payer: Medicare Other | Admitting: Ophthalmology

## 2018-11-24 DIAGNOSIS — E113312 Type 2 diabetes mellitus with moderate nonproliferative diabetic retinopathy with macular edema, left eye: Secondary | ICD-10-CM | POA: Diagnosis not present

## 2018-11-24 DIAGNOSIS — H35372 Puckering of macula, left eye: Secondary | ICD-10-CM

## 2018-11-24 DIAGNOSIS — H35033 Hypertensive retinopathy, bilateral: Secondary | ICD-10-CM

## 2018-11-24 DIAGNOSIS — I10 Essential (primary) hypertension: Secondary | ICD-10-CM

## 2018-11-24 DIAGNOSIS — H59032 Cystoid macular edema following cataract surgery, left eye: Secondary | ICD-10-CM

## 2018-11-24 DIAGNOSIS — E11311 Type 2 diabetes mellitus with unspecified diabetic retinopathy with macular edema: Secondary | ICD-10-CM

## 2018-11-24 DIAGNOSIS — H43813 Vitreous degeneration, bilateral: Secondary | ICD-10-CM

## 2018-11-24 DIAGNOSIS — E113391 Type 2 diabetes mellitus with moderate nonproliferative diabetic retinopathy without macular edema, right eye: Secondary | ICD-10-CM

## 2019-03-20 ENCOUNTER — Inpatient Hospital Stay (HOSPITAL_COMMUNITY)
Admission: EM | Admit: 2019-03-20 | Discharge: 2019-04-02 | DRG: 871 | Disposition: A | Discharge status: Skilled Nursing Facility | Payer: Medicare Other | Source: Skilled Nursing Facility | Attending: Internal Medicine | Admitting: Internal Medicine

## 2019-03-20 ENCOUNTER — Other Ambulatory Visit: Payer: Self-pay

## 2019-03-20 ENCOUNTER — Emergency Department (HOSPITAL_COMMUNITY): Payer: Medicare Other

## 2019-03-20 ENCOUNTER — Encounter (HOSPITAL_COMMUNITY): Payer: Self-pay

## 2019-03-20 DIAGNOSIS — E870 Hyperosmolality and hypernatremia: Secondary | ICD-10-CM | POA: Diagnosis present

## 2019-03-20 DIAGNOSIS — L89312 Pressure ulcer of right buttock, stage 2: Secondary | ICD-10-CM | POA: Diagnosis present

## 2019-03-20 DIAGNOSIS — N319 Neuromuscular dysfunction of bladder, unspecified: Secondary | ICD-10-CM | POA: Diagnosis present

## 2019-03-20 DIAGNOSIS — E119 Type 2 diabetes mellitus without complications: Secondary | ICD-10-CM | POA: Diagnosis not present

## 2019-03-20 DIAGNOSIS — I629 Nontraumatic intracranial hemorrhage, unspecified: Secondary | ICD-10-CM

## 2019-03-20 DIAGNOSIS — Z66 Do not resuscitate: Secondary | ICD-10-CM | POA: Diagnosis present

## 2019-03-20 DIAGNOSIS — I609 Nontraumatic subarachnoid hemorrhage, unspecified: Secondary | ICD-10-CM

## 2019-03-20 DIAGNOSIS — F329 Major depressive disorder, single episode, unspecified: Secondary | ICD-10-CM | POA: Diagnosis not present

## 2019-03-20 DIAGNOSIS — J690 Pneumonitis due to inhalation of food and vomit: Secondary | ICD-10-CM | POA: Diagnosis present

## 2019-03-20 DIAGNOSIS — L89322 Pressure ulcer of left buttock, stage 2: Secondary | ICD-10-CM | POA: Diagnosis present

## 2019-03-20 DIAGNOSIS — Z87891 Personal history of nicotine dependence: Secondary | ICD-10-CM | POA: Diagnosis not present

## 2019-03-20 DIAGNOSIS — Z960 Presence of urogenital implants: Secondary | ICD-10-CM

## 2019-03-20 DIAGNOSIS — R5383 Other fatigue: Secondary | ICD-10-CM | POA: Diagnosis not present

## 2019-03-20 DIAGNOSIS — N3281 Overactive bladder: Secondary | ICD-10-CM

## 2019-03-20 DIAGNOSIS — E871 Hypo-osmolality and hyponatremia: Secondary | ICD-10-CM | POA: Diagnosis not present

## 2019-03-20 DIAGNOSIS — I509 Heart failure, unspecified: Secondary | ICD-10-CM | POA: Diagnosis present

## 2019-03-20 DIAGNOSIS — I6202 Nontraumatic subacute subdural hemorrhage: Secondary | ICD-10-CM | POA: Diagnosis not present

## 2019-03-20 DIAGNOSIS — I4510 Unspecified right bundle-branch block: Secondary | ICD-10-CM | POA: Diagnosis present

## 2019-03-20 DIAGNOSIS — R400 Somnolence: Secondary | ICD-10-CM

## 2019-03-20 DIAGNOSIS — R6520 Severe sepsis without septic shock: Secondary | ICD-10-CM | POA: Diagnosis present

## 2019-03-20 DIAGNOSIS — I160 Hypertensive urgency: Secondary | ICD-10-CM | POA: Diagnosis not present

## 2019-03-20 DIAGNOSIS — S065X9A Traumatic subdural hemorrhage with loss of consciousness of unspecified duration, initial encounter: Secondary | ICD-10-CM | POA: Diagnosis not present

## 2019-03-20 DIAGNOSIS — R809 Proteinuria, unspecified: Secondary | ICD-10-CM | POA: Diagnosis present

## 2019-03-20 DIAGNOSIS — I69354 Hemiplegia and hemiparesis following cerebral infarction affecting left non-dominant side: Secondary | ICD-10-CM

## 2019-03-20 DIAGNOSIS — F0150 Vascular dementia without behavioral disturbance: Secondary | ICD-10-CM | POA: Diagnosis present

## 2019-03-20 DIAGNOSIS — W19XXXA Unspecified fall, initial encounter: Secondary | ICD-10-CM | POA: Diagnosis present

## 2019-03-20 DIAGNOSIS — E785 Hyperlipidemia, unspecified: Secondary | ICD-10-CM | POA: Diagnosis present

## 2019-03-20 DIAGNOSIS — R0600 Dyspnea, unspecified: Secondary | ICD-10-CM

## 2019-03-20 DIAGNOSIS — Z8679 Personal history of other diseases of the circulatory system: Secondary | ICD-10-CM

## 2019-03-20 DIAGNOSIS — R1310 Dysphagia, unspecified: Secondary | ICD-10-CM | POA: Diagnosis not present

## 2019-03-20 DIAGNOSIS — Z515 Encounter for palliative care: Secondary | ICD-10-CM | POA: Diagnosis not present

## 2019-03-20 DIAGNOSIS — Z7902 Long term (current) use of antithrombotics/antiplatelets: Secondary | ICD-10-CM

## 2019-03-20 DIAGNOSIS — E269 Hyperaldosteronism, unspecified: Secondary | ICD-10-CM | POA: Diagnosis not present

## 2019-03-20 DIAGNOSIS — R82998 Other abnormal findings in urine: Secondary | ICD-10-CM

## 2019-03-20 DIAGNOSIS — N179 Acute kidney failure, unspecified: Secondary | ICD-10-CM | POA: Diagnosis present

## 2019-03-20 DIAGNOSIS — Y92129 Unspecified place in nursing home as the place of occurrence of the external cause: Secondary | ICD-10-CM | POA: Diagnosis not present

## 2019-03-20 DIAGNOSIS — S066X9A Traumatic subarachnoid hemorrhage with loss of consciousness of unspecified duration, initial encounter: Secondary | ICD-10-CM | POA: Diagnosis not present

## 2019-03-20 DIAGNOSIS — R627 Adult failure to thrive: Secondary | ICD-10-CM | POA: Diagnosis present

## 2019-03-20 DIAGNOSIS — G4733 Obstructive sleep apnea (adult) (pediatric): Secondary | ICD-10-CM | POA: Diagnosis present

## 2019-03-20 DIAGNOSIS — G9341 Metabolic encephalopathy: Secondary | ICD-10-CM | POA: Diagnosis present

## 2019-03-20 DIAGNOSIS — A419 Sepsis, unspecified organism: Secondary | ICD-10-CM | POA: Diagnosis not present

## 2019-03-20 DIAGNOSIS — J181 Lobar pneumonia, unspecified organism: Secondary | ICD-10-CM

## 2019-03-20 DIAGNOSIS — Z9181 History of falling: Secondary | ICD-10-CM

## 2019-03-20 DIAGNOSIS — Z7189 Other specified counseling: Secondary | ICD-10-CM | POA: Diagnosis not present

## 2019-03-20 DIAGNOSIS — R011 Cardiac murmur, unspecified: Secondary | ICD-10-CM

## 2019-03-20 DIAGNOSIS — I10 Essential (primary) hypertension: Secondary | ICD-10-CM | POA: Diagnosis not present

## 2019-03-20 DIAGNOSIS — G9340 Encephalopathy, unspecified: Secondary | ICD-10-CM | POA: Diagnosis not present

## 2019-03-20 DIAGNOSIS — J189 Pneumonia, unspecified organism: Secondary | ICD-10-CM | POA: Diagnosis not present

## 2019-03-20 DIAGNOSIS — Z79899 Other long term (current) drug therapy: Secondary | ICD-10-CM

## 2019-03-20 DIAGNOSIS — J9601 Acute respiratory failure with hypoxia: Secondary | ICD-10-CM | POA: Diagnosis present

## 2019-03-20 DIAGNOSIS — D649 Anemia, unspecified: Secondary | ICD-10-CM | POA: Diagnosis present

## 2019-03-20 DIAGNOSIS — I161 Hypertensive emergency: Secondary | ICD-10-CM | POA: Diagnosis present

## 2019-03-20 DIAGNOSIS — Z888 Allergy status to other drugs, medicaments and biological substances status: Secondary | ICD-10-CM

## 2019-03-20 DIAGNOSIS — I110 Hypertensive heart disease with heart failure: Secondary | ICD-10-CM | POA: Diagnosis present

## 2019-03-20 DIAGNOSIS — Z20828 Contact with and (suspected) exposure to other viral communicable diseases: Secondary | ICD-10-CM | POA: Diagnosis present

## 2019-03-20 DIAGNOSIS — Z794 Long term (current) use of insulin: Secondary | ICD-10-CM

## 2019-03-20 LAB — URINALYSIS, ROUTINE W REFLEX MICROSCOPIC
Bilirubin Urine: NEGATIVE
Glucose, UA: NEGATIVE mg/dL
Ketones, ur: 20 mg/dL — AB
Nitrite: NEGATIVE
Protein, ur: >=300 mg/dL — AB
Specific Gravity, Urine: 1.016 (ref 1.005–1.030)
pH: 9 — ABNORMAL HIGH (ref 5.0–8.0)

## 2019-03-20 LAB — BASIC METABOLIC PANEL
Anion gap: 11 (ref 5–15)
BUN: 36 mg/dL — ABNORMAL HIGH (ref 8–23)
CO2: 25 mmol/L (ref 22–32)
Calcium: 9.2 mg/dL (ref 8.9–10.3)
Chloride: 112 mmol/L — ABNORMAL HIGH (ref 98–111)
Creatinine, Ser: 1.39 mg/dL — ABNORMAL HIGH (ref 0.61–1.24)
GFR calc Af Amer: 57 mL/min — ABNORMAL LOW (ref >60)
GFR calc non Af Amer: 49 mL/min — ABNORMAL LOW (ref >60)
Glucose, Bld: 178 mg/dL — ABNORMAL HIGH (ref 70–99)
Potassium: 3.6 mmol/L (ref 3.5–5.1)
Sodium: 148 mmol/L — ABNORMAL HIGH (ref 135–145)

## 2019-03-20 LAB — COMPREHENSIVE METABOLIC PANEL
ALT: 10 U/L (ref 0–44)
AST: 13 U/L — ABNORMAL LOW (ref 15–41)
Albumin: 3 g/dL — ABNORMAL LOW (ref 3.5–5.0)
Alkaline Phosphatase: 70 U/L (ref 38–126)
Anion gap: 12 (ref 5–15)
BUN: 38 mg/dL — ABNORMAL HIGH (ref 8–23)
CO2: 27 mmol/L (ref 22–32)
Calcium: 9.5 mg/dL (ref 8.9–10.3)
Chloride: 110 mmol/L (ref 98–111)
Creatinine, Ser: 1.68 mg/dL — ABNORMAL HIGH (ref 0.61–1.24)
GFR calc Af Amer: 45 mL/min — ABNORMAL LOW (ref >60)
GFR calc non Af Amer: 39 mL/min — ABNORMAL LOW (ref >60)
Glucose, Bld: 136 mg/dL — ABNORMAL HIGH (ref 70–99)
Potassium: 3.4 mmol/L — ABNORMAL LOW (ref 3.5–5.1)
Sodium: 149 mmol/L — ABNORMAL HIGH (ref 135–145)
Total Bilirubin: 0.7 mg/dL (ref 0.3–1.2)
Total Protein: 6.6 g/dL (ref 6.5–8.1)

## 2019-03-20 LAB — CBC WITH DIFFERENTIAL/PLATELET
Abs Immature Granulocytes: 0.06 10*3/uL (ref 0.00–0.07)
Basophils Absolute: 0 10*3/uL (ref 0.0–0.1)
Basophils Relative: 0 %
Eosinophils Absolute: 0 10*3/uL (ref 0.0–0.5)
Eosinophils Relative: 0 %
HCT: 42.7 % (ref 39.0–52.0)
Hemoglobin: 12.4 g/dL — ABNORMAL LOW (ref 13.0–17.0)
Immature Granulocytes: 0 %
Lymphocytes Relative: 9 %
Lymphs Abs: 1.4 10*3/uL (ref 0.7–4.0)
MCH: 25.1 pg — ABNORMAL LOW (ref 26.0–34.0)
MCHC: 29 g/dL — ABNORMAL LOW (ref 30.0–36.0)
MCV: 86.3 fL (ref 80.0–100.0)
Monocytes Absolute: 1 10*3/uL (ref 0.1–1.0)
Monocytes Relative: 7 %
Neutro Abs: 12.4 10*3/uL — ABNORMAL HIGH (ref 1.7–7.7)
Neutrophils Relative %: 84 %
Platelets: 272 10*3/uL (ref 150–400)
RBC: 4.95 MIL/uL (ref 4.22–5.81)
RDW: 16.9 % — ABNORMAL HIGH (ref 11.5–15.5)
WBC: 14.8 10*3/uL — ABNORMAL HIGH (ref 4.0–10.5)
nRBC: 0.4 % — ABNORMAL HIGH (ref 0.0–0.2)

## 2019-03-20 LAB — GLUCOSE, CAPILLARY: Glucose-Capillary: 148 mg/dL — ABNORMAL HIGH (ref 70–99)

## 2019-03-20 LAB — SARS CORONAVIRUS 2: SARS Coronavirus 2: NOT DETECTED

## 2019-03-20 LAB — LACTIC ACID, PLASMA: Lactic Acid, Venous: 1.7 mmol/L (ref 0.5–1.9)

## 2019-03-20 LAB — CBG MONITORING, ED
Glucose-Capillary: 121 mg/dL — ABNORMAL HIGH (ref 70–99)
Glucose-Capillary: 168 mg/dL — ABNORMAL HIGH (ref 70–99)

## 2019-03-20 LAB — VANCOMYCIN, RANDOM: Vancomycin Rm: <4

## 2019-03-20 LAB — STREP PNEUMONIAE URINARY ANTIGEN: Strep Pneumo Urinary Antigen: NEGATIVE

## 2019-03-20 MED ORDER — INSULIN GLARGINE 100 UNIT/ML ~~LOC~~ SOLN
6.0000 [IU] | Freq: Every day | SUBCUTANEOUS | Status: DC
  Administered 2019-03-21 – 2019-03-23 (×4): 6 [IU] via SUBCUTANEOUS
  Filled 2019-03-20 (×4): qty 0.06

## 2019-03-20 MED ORDER — PREDNISOLONE ACETATE 1 % OP SUSP
1.0000 [drp] | Freq: Three times a day (TID) | OPHTHALMIC | Status: DC
  Administered 2019-03-21 – 2019-04-02 (×52): 1 [drp] via OPHTHALMIC
  Filled 2019-03-20: qty 5

## 2019-03-20 MED ORDER — POLYETHYLENE GLYCOL 3350 17 G PO PACK
17.0000 g | PACK | Freq: Every day | ORAL | Status: DC
  Administered 2019-03-24 – 2019-04-02 (×5): 17 g via ORAL
  Filled 2019-03-20 (×7): qty 1

## 2019-03-20 MED ORDER — SENNA 8.6 MG PO TABS: 1.0000 | ORAL_TABLET | Freq: Two times a day (BID) | ORAL | Status: DC | PRN

## 2019-03-20 MED ORDER — ACETAMINOPHEN 650 MG RE SUPP
650.0000 mg | Freq: Four times a day (QID) | RECTAL | Status: DC | PRN
  Administered 2019-03-20 – 2019-03-23 (×6): 650 mg via RECTAL
  Filled 2019-03-20 (×6): qty 1

## 2019-03-20 MED ORDER — POLYVINYL ALCOHOL 1.4 % OP SOLN
1.0000 [drp] | Freq: Two times a day (BID) | OPHTHALMIC | Status: DC
  Administered 2019-03-21 – 2019-04-02 (×26): 1 [drp] via OPHTHALMIC
  Filled 2019-03-20: qty 15

## 2019-03-20 MED ORDER — HYDRALAZINE HCL 20 MG/ML IJ SOLN
10.0000 mg | Freq: Four times a day (QID) | INTRAMUSCULAR | Status: DC | PRN
  Administered 2019-03-21 – 2019-03-22 (×4): 10 mg via INTRAVENOUS
  Filled 2019-03-20 (×4): qty 1

## 2019-03-20 MED ORDER — AMLODIPINE BESYLATE 10 MG PO TABS: 10.0000 mg | ORAL_TABLET | ORAL | Status: DC

## 2019-03-20 MED ORDER — ACETAMINOPHEN 325 MG PO TABS
650.0000 mg | ORAL_TABLET | Freq: Four times a day (QID) | ORAL | Status: DC | PRN
  Administered 2019-03-27: 650 mg via ORAL
  Filled 2019-03-20: qty 2

## 2019-03-20 MED ORDER — SODIUM CHLORIDE 0.9 % IV SOLN
3.0000 g | Freq: Three times a day (TID) | INTRAVENOUS | Status: DC
  Administered 2019-03-20 – 2019-03-21 (×2): 3 g via INTRAVENOUS
  Filled 2019-03-20 (×6): qty 3

## 2019-03-20 MED ORDER — PIPERACILLIN-TAZOBACTAM 3.375 G IVPB 30 MIN
3.3750 g | Freq: Once | INTRAVENOUS | Status: AC
  Administered 2019-03-20: 10:00:00 3.375 g via INTRAVENOUS
  Filled 2019-03-20: qty 50

## 2019-03-20 MED ORDER — VANCOMYCIN HCL IN DEXTROSE 1-5 GM/200ML-% IV SOLN: 1000.0000 mg | Freq: Once | INTRAVENOUS | Status: DC

## 2019-03-20 MED ORDER — SERTRALINE HCL 50 MG PO TABS
50.0000 mg | ORAL_TABLET | Freq: Every day | ORAL | Status: DC
  Administered 2019-03-24 – 2019-04-02 (×9): 50 mg via ORAL
  Filled 2019-03-20 (×11): qty 1

## 2019-03-20 MED ORDER — SODIUM CHLORIDE 0.45 % IV SOLN
INTRAVENOUS | Status: AC
  Administered 2019-03-20: 14:00:00 via INTRAVENOUS

## 2019-03-20 MED ORDER — HYDRALAZINE HCL 50 MG PO TABS
75.0000 mg | ORAL_TABLET | Freq: Four times a day (QID) | ORAL | Status: DC
  Filled 2019-03-20: qty 1

## 2019-03-20 MED ORDER — CLOPIDOGREL BISULFATE 75 MG PO TABS: 75.0000 mg | ORAL_TABLET | Freq: Every day | ORAL | Status: DC

## 2019-03-20 MED ORDER — ENOXAPARIN SODIUM 40 MG/0.4ML ~~LOC~~ SOLN
40.0000 mg | Freq: Every day | SUBCUTANEOUS | Status: DC
  Administered 2019-03-21 – 2019-03-23 (×3): 40 mg via SUBCUTANEOUS
  Filled 2019-03-20 (×3): qty 0.4

## 2019-03-20 MED ORDER — CARVEDILOL 12.5 MG PO TABS
12.5000 mg | ORAL_TABLET | Freq: Two times a day (BID) | ORAL | Status: DC
  Filled 2019-03-20: qty 1

## 2019-03-20 MED ORDER — VANCOMYCIN HCL IN DEXTROSE 1-5 GM/200ML-% IV SOLN
1000.0000 mg | Freq: Once | INTRAVENOUS | Status: AC
  Administered 2019-03-20: 12:00:00 1000 mg via INTRAVENOUS
  Filled 2019-03-20: qty 200

## 2019-03-20 MED ORDER — MEMANTINE HCL ER 28 MG PO CP24
28.0000 mg | ORAL_CAPSULE | Freq: Every day | ORAL | Status: DC
  Administered 2019-03-24 – 2019-04-02 (×9): 28 mg via ORAL
  Filled 2019-03-20 (×13): qty 1

## 2019-03-20 MED ORDER — SODIUM CHLORIDE 0.9 % IV BOLUS
1000.0000 mL | Freq: Once | INTRAVENOUS | Status: AC
  Administered 2019-03-20: 1000 mL via INTRAVENOUS

## 2019-03-20 MED ORDER — DONEPEZIL HCL 10 MG PO TABS
10.0000 mg | ORAL_TABLET | Freq: Every day | ORAL | Status: DC
  Administered 2019-03-25 – 2019-04-01 (×8): 10 mg via ORAL
  Filled 2019-03-20 (×8): qty 1

## 2019-03-20 MED ORDER — INSULIN ASPART 100 UNIT/ML ~~LOC~~ SOLN
0.0000 [IU] | Freq: Three times a day (TID) | SUBCUTANEOUS | Status: DC
  Administered 2019-03-20 – 2019-03-21 (×2): 2 [IU] via SUBCUTANEOUS
  Administered 2019-03-21 (×2): 1 [IU] via SUBCUTANEOUS
  Administered 2019-03-22: 18:00:00 3 [IU] via SUBCUTANEOUS
  Administered 2019-03-22: 6 [IU] via SUBCUTANEOUS
  Administered 2019-03-23 – 2019-03-27 (×7): 1 [IU] via SUBCUTANEOUS

## 2019-03-20 MED ORDER — AMLODIPINE BESYLATE 10 MG PO TABS
10.0000 mg | ORAL_TABLET | Freq: Every day | ORAL | Status: DC
  Filled 2019-03-20: qty 1

## 2019-03-20 MED ORDER — DEXTROSE 5 % IV BOLUS
1000.0000 mL | Freq: Once | INTRAVENOUS | Status: AC
  Administered 2019-03-21: 1000 mL via INTRAVENOUS

## 2019-03-20 MED ORDER — MIRABEGRON ER 25 MG PO TB24
50.0000 mg | ORAL_TABLET | Freq: Every day | ORAL | Status: DC
  Administered 2019-03-24 – 2019-04-02 (×9): 50 mg via ORAL
  Filled 2019-03-20 (×5): qty 1
  Filled 2019-03-20: qty 2
  Filled 2019-03-20 (×4): qty 1
  Filled 2019-03-20 (×2): qty 2
  Filled 2019-03-20: qty 1

## 2019-03-20 NOTE — ED Notes (Signed)
ED TO INPATIENT HANDOFF REPORT  ED Nurse Name and Phone #: Gwyndolyn Saxon [phone]  S Name/Age/Gender [name] 78 y.o. male Room/Bed: 563O/756E  Code Status   Code Status: Full Code  Home/SNF/Other Skilled nursing facility Patient oriented to: self Is this baseline? Yes   Triage Complete: Triage complete  Chief Complaint sob  Triage Note Pt is from [place] was Dx with Pneumonia on Tue (per EMS) & was said to be given IV Vancomycin for it as well as 4L O2 via n/c, but has not gotten any better. Upon arrival to ED pt was resting but easily roused while on NRB & sating at 93%. Febrile at 100.8, resp 34, & BP was 235/110. Pt c/o generalized pain all over & has dark urine appearing in his foley.   Allergies Allergies  Allergen Reactions  . Pioglitazone Other (See Comments)    Edema   . Rosiglitazone Maleate Other (See Comments)    edema    Level of Care/Admitting Diagnosis ED Disposition    ED Disposition Condition Comment   Admit  Hospital Area: [facility] [[id number]]  Level of Care: Telemetry Medical [104]  Covid Evaluation: Screening Protocol (No Symptoms)  Diagnosis: Aspiration pneumonia [facility]) [[id number]]  Admitting Physician: Aldine Contes [[id number]]  Attending Physician: Aldine Contes [phone]  Estimated length of stay: past midnight tomorrow  Certification:: I certify this patient will need inpatient services for at least 2 midnights  PT Class (Do Not Modify): Inpatient [101]  PT Acc Code (Do Not Modify): Private [1]       B Medical/Surgery History Past Medical History:  Diagnosis Date  . Arthritis    "maybe"  . 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 CHF (congestive heart failure) ([place])   . Depression   . 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 Hyperlipemia    . Hypertension   . Sleep apnea    "my machine [place]; got to get me another one" (09/01/2014)  . Stroke [facility]) 2009   left thalamic infarct, residual LUE weakness  . TIA (transient ischemic attack)    multiple in past, most recently in 2011  . Type II diabetes mellitus ([place])    Past Surgical History:  Procedure Laterality Date  . CARDIAC CATHETERIZATION  07/2010  . CRANIOTOMY  05/2008   for clipping of an incidental asymptomatic unruptured/notes 02/13/2011  . KNEE ARTHROSCOPY Left   . 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 SHOULDER ARTHROSCOPY W/ ROTATOR CUFF REPAIR Right      A IV Location/Drains/Wounds Patient Lines/Drains/Airways Status   Active Line/Drains/Airways    Name:   Placement date:   Placement time:   Site:   Days:   Peripheral IV 03/20/19 Right Arm   03/20/19    0915    Arm   less than 1   Urethral Catheter Nurse from [facility]   03/20/19    0914    -   less than 1          Intake/Output Last 24 hours  Intake/Output Summary (Last 24 hours) at 03/20/2019 2039 Last data filed at 03/20/2019 1417 Gross per 24 hour  Intake 1200 ml  Output -  Net 1200 ml    Labs/Imaging Results for orders placed or performed during the hospital encounter of 03/20/19 (  from the past 48 hour(s))  CBG monitoring, ED     Status: Abnormal   Collection Time: 03/20/19  8:18 AM  Result Value Ref Range   Glucose-Capillary 121 (H) 70 - 99 mg/dL  Lactic acid, plasma     Status: None   Collection Time: 03/20/19  8:27 AM  Result Value Ref Range   Lactic Acid, Venous 1.7 0.5 - 1.9 mmol/L    Comment: Performed at [facility] [address], [place], [place] [address]  Comprehensive metabolic panel     Status: Abnormal   Collection Time: 03/20/19  8:27 AM  Result Value Ref Range   Sodium 149 (H) 135 - 145 mmol/L   Potassium 3.4 (L) 3.5 - 5.1 mmol/L   Chloride 110 98 - 111 mmol/L   CO2 27 22 - 32 mmol/L   Glucose, Bld 136 (H) 70 - 99 mg/dL   BUN 38 (H) 8 - 23  mg/dL   Creatinine, Ser 1.68 (H) 0.61 - 1.24 mg/dL   Calcium 9.5 8.9 - 10.3 mg/dL   Total Protein 6.6 6.5 - 8.1 g/dL   Albumin 3.0 (L) 3.5 - 5.0 g/dL   AST 13 (L) 15 - 41 U/L   ALT 10 0 - 44 U/L   Alkaline Phosphatase 70 38 - 126 U/L   Total Bilirubin 0.7 0.3 - 1.2 mg/dL   GFR calc non Af Amer 39 (L) >60 mL/min   GFR calc Af Amer 45 (L) >60 mL/min   Anion gap 12 5 - 15    Comment: Performed at [facility], [place] [address], [place], [place] [address]  CBC WITH DIFFERENTIAL     Status: Abnormal   Collection Time: 03/20/19  8:27 AM  Result Value Ref Range   WBC 14.8 (H) 4.0 - 10.5 K/uL   RBC 4.95 4.22 - 5.81 MIL/uL   Hemoglobin 12.4 (L) 13.0 - 17.0 g/dL   HCT 42.7 39.0 - 52.0 %   MCV 86.3 80.0 - 100.0 fL   MCH 25.1 (L) 26.0 - 34.0 pg   MCHC 29.0 (L) 30.0 - 36.0 g/dL   RDW 16.9 (H) 11.5 - 15.5 %   Platelets 272 150 - 400 K/uL   nRBC 0.4 (H) 0.0 - 0.2 %   Neutrophils Relative % 84 %   Neutro Abs 12.4 (H) 1.7 - 7.7 K/uL   Lymphocytes Relative 9 %   Lymphs Abs 1.4 0.7 - 4.0 K/uL   Monocytes Relative 7 %   Monocytes Absolute 1.0 0.1 - 1.0 K/uL   Eosinophils Relative 0 %   Eosinophils Absolute 0.0 0.0 - 0.5 K/uL   Basophils Relative 0 %   Basophils Absolute 0.0 0.0 - 0.1 K/uL   Immature Granulocytes 0 %   Abs Immature Granulocytes 0.06 0.00 - 0.07 K/uL    Comment: Performed at [facility], [address], [place], [place] [address]  Urinalysis, Routine w reflex microscopic     Status: Abnormal   Collection Time: 03/20/19  8:27 AM  Result Value Ref Range   Color, Urine YELLOW YELLOW   APPearance TURBID (A) CLEAR   Specific Gravity, Urine 1.016 1.005 - 1.030   pH 9.0 (H) 5.0 - 8.0   Glucose, UA NEGATIVE NEGATIVE mg/dL   Hgb urine dipstick SMALL (A) NEGATIVE   Bilirubin Urine NEGATIVE NEGATIVE   Ketones, ur 20 (A) NEGATIVE mg/dL   Protein, ur >=300 (A) NEGATIVE mg/dL   Nitrite NEGATIVE NEGATIVE   Leukocytes,Ua LARGE (  A) NEGATIVE   RBC / HPF 0-5 0 - 5 RBC/hpf    WBC, UA 6-10 0 - 5 WBC/hpf   Bacteria, UA RARE (A) NONE SEEN   Mucus PRESENT    Triple Phosphate Crystal PRESENT    Non Squamous Epithelial 0-5 (A) NONE SEEN    Comment: Performed at [facility], [place] [address] [place], [place] [address]  Strep pneumoniae urinary antigen     Status: None   Collection Time: 03/20/19  8:37 AM  Result Value Ref Range   Strep Pneumo Urinary Antigen NEGATIVE NEGATIVE    Comment:        Infection due to S. pneumoniae cannot be absolutely ruled out since the antigen present may be below the detection limit of the test. Performed at [facility], [place] [address], [place], [place] [address]   SARS Coronavirus 2     Status: None   Collection Time: 03/20/19  8:39 AM  Result Value Ref Range   SARS Coronavirus 2 NOT DETECTED NOT DETECTED    Comment: Performed at [facility], [place] [address] [place], [place] [address] CORRECTED ON 06/05 AT 1025: PREVIOUSLY REPORTED AS NEGATIVE   Vancomycin, random     Status: None   Collection Time: 03/20/19  9:14 AM  Result Value Ref Range   Vancomycin Rm <4     Comment:        Random Vancomycin therapeutic range is dependent on dosage and time of specimen collection. A peak range is 20.0-40.0 ug/mL A trough range is 5.0-15.0 ug/mL        RESULTS CONFIRMED BY MANUAL DILUTION Performed at [facility] [address] [place], [place] [address]   CBG monitoring, ED     Status: Abnormal   Collection Time: 03/20/19  5:34 PM  Result Value Ref Range   Glucose-Capillary 168 (H) 70 - 99 mg/dL   Dg Chest Port 1 View  Result Date: 03/20/2019 CLINICAL DATA:  Fever, pneumonia, shortness of breath EXAM: PORTABLE CHEST 1 VIEW COMPARISON:  08/29/2016 FINDINGS: Consolidation throughout the right lung compatible with pneumonia. Mild cardiomegaly and vascular congestion. Left basilar opacity could reflect atelectasis or infiltrate. No acute bony abnormality. IMPRESSION: Diffuse consolidation throughout the  right lung compatible with pneumonia. Left base atelectasis or early infiltrate/pneumonia. Mild cardiomegaly. Electronically Signed   By: Rolm Baptise M.D.   On: 03/20/2019 09:29    Pending Labs Unresulted Labs (From admission, onward)    Start     Ordered   03/21/19 9030  Basic metabolic panel  Tomorrow morning,   R     03/20/19 1348   03/21/19 0500  CBC  Tomorrow morning,   R     03/20/19 1348   03/20/19 0923  Basic metabolic panel  Once,   R     03/20/19 1350   03/20/19 1322  MRSA PCR Screening  Once,   R     03/20/19 1321   03/20/19 1253  Legionella Pneumophila Serogp 1 Ur Ag  Once,   R     03/20/19 1252   03/20/19 0827  Blood Culture (routine x 2)  BLOOD CULTURE X 2,   STAT     03/20/19 0827          Vitals/Pain Today's Vitals   03/20/19 1915 03/20/19 1930 03/20/19 2000 03/20/19 2015  BP: (!) 194/74  (!) 162/77   Pulse:      Resp: 20  (!) 22  Temp:      TempSrc:      SpO2:      PainSc:  Asleep  Asleep    Isolation Precautions No active isolations  Medications Medications  Ampicillin-Sulbactam (UNASYN) 3 g in sodium chloride 0.9 % 100 mL IVPB (0 g Intravenous Stopped 03/20/19 2012)  donepezil (ARICEPT) tablet 10 mg (has no administration in time range)  amLODipine (NORVASC) tablet 10 mg (has no administration in time range)  insulin glargine (LANTUS) injection 6 Units (has no administration in time range)  mirabegron ER (MYRBETRIQ) tablet 50 mg (has no administration in time range)  memantine (NAMENDA XR) 24 hr capsule 28 mg (has no administration in time range)  clopidogrel (PLAVIX) tablet 75 mg (has no administration in time range)  sertraline (ZOLOFT) tablet 50 mg (has no administration in time range)  carvedilol (COREG) tablet 12.5 mg (has no administration in time range)  hydrALAZINE (APRESOLINE) tablet 75 mg (has no administration in time range)  polyethylene glycol (MIRALAX / GLYCOLAX) packet 17 g (has no administration in time range)  senna (SENOKOT)  tablet 8.6 mg (has no administration in time range)  prednisoLONE acetate (PRED FORTE) 1 % ophthalmic suspension 1 drop (has no administration in time range)  Polyethyl Glycol-Propyl Glycol 0.4-0.3 % SOLN 1 drop (has no administration in time range)  enoxaparin (LOVENOX) injection 30 mg (has no administration in time range)  acetaminophen (TYLENOL) tablet 650 mg ( Oral See Alternative 03/20/19 1727)    Or  acetaminophen (TYLENOL) suppository 650 mg (650 mg Rectal Given 03/20/19 1727)  0.45 % sodium chloride infusion ( Intravenous Stopped 03/20/19 2012)  insulin aspart (novoLOG) injection 0-9 Units (2 Units Subcutaneous Given 03/20/19 1839)  sodium chloride 0.9 % bolus 1,000 mL (0 mLs Intravenous Stopped 03/20/19 1147)  piperacillin-tazobactam (ZOSYN) IVPB 3.375 g (0 g Intravenous Stopped 03/20/19 1036)  vancomycin (VANCOCIN) IVPB 1000 mg/200 mL premix (0 mg Intravenous Stopped 03/20/19 1417)    Mobility non-ambulatory Low fall risk   Focused Assessments Pulmonary Assessment Handoff:  Lung sounds: L Breath Sounds: Rhonchi R Breath Sounds: Rhonchi O2 Device: Nasal Cannula O2 Flow Rate (L/min): 4 L/min      R Recommendations: See Admitting Provider Note  Report given to:   Additional Notes: Patient is non-verbal but alert to self; It has been reported that patient has trouble swallow therefore no PO meds given; Request has been made to MD to change all PO meds to IV; Last BS was 168 and  2 units of insulin given for coverage; Patient has Foley in place from prior facility; Pt has periods of "wet" coughing -Monique,RN

## 2019-03-20 NOTE — ED Triage Notes (Signed)
Pt is from [place] was Dx with Pneumonia on Tue (per EMS) & was said to be given IV Vancomycin for it as well as 4L O2 via n/c, but has not gotten any better. Upon arrival to ED pt was resting but easily roused while on NRB & sating at 93%. Febrile at 100.8, resp 34, & BP was 235/110. Pt c/o generalized pain all over & has dark urine appearing in his foley.

## 2019-03-20 NOTE — ED Notes (Signed)
Admitting messaged to change the BP meds from po to IV.

## 2019-03-20 NOTE — ED Notes (Signed)
This RN spoke with the pts daughter Cayde Held [phone] -if she doesn't answer leave her a message & she WILL return the call) about more insight on the pt. She informed this RN that pt was unable to swallow well/safe at the nursing home when they tested him. This RN will inform the floor RN, pt pt is being readied for transport up to his room.

## 2019-03-20 NOTE — H&P (Addendum)
Date: 03/20/2019               Patient Name:  [name] MRN: [id number]  DOB: 11/26/1940 Age / Sex: 78 y.o., male   PCP: Patient, No Pcp Per         Medical Service: Internal Medicine Teaching Service         Attending Physician: Dr. [name]    First Contact: Dr. Alfonse Spruce Pager: [phone]  Second Contact: Dr. Maricela Bo Pager: [phone]       After Hours (After 5p/  First Contact Pager: [phone]  weekends / holidays): Second Contact Pager: [phone]   Chief Complaint: pneumonia  History of Present Illness:  Mr. Mella is a 78yo male with PMH of hyperaldosteronism, HTN, T2DM, neurogenic bladder s/p suprapubic catheter, h/o CVA with left sided hemiparesis, vascular dementia presenting from [facility] due to nonresolving pneumonia.  History is given by nursing home staff as well as chart review.  Nursing home staff states that he was in usual state of health until 4 days prior to presentation on Monday.  He was feeding himself in his room when someone walked in and noticed that he was very diaphoretic.  He did not complain of chest pain, cough, or choking.  The next morning he woke up and he was very short of breath.  They ordered a checks x-ray which showed a right lower lobe pneumonia.  He was initially started on ertapenem (1 day) and a midline was placed.  He was then transitioned to ceftriaxone and metronidazole.  This morning he woke up and was even more short of breath, had low oxygen saturations (needed 4 liters supplemental oxygen) and was febrile to 100.47F.  Staff became concerned and called EMS.  The ED initially noted that he was complaining of generalized pain all over.  We attempted to speak to him but he did not answer any of her questions.  He does however open his eyes with verbal and tactile stimuli.  He intermittently follows commands.  Per nursing staff he has dementia. He is able to speak clearly but does become confused at times.  He frequently responds to  questions apparently. He is chair-bound (unable to push his wheelchair around).  He cannot perform any of his ADL's.  On arrival to the ED, he was febrile to 101.78F, tachypneic to 34 breaths per minute, hypertensive to 192/90, and mildly tachycardic. He was brought in by EMS on nonrebreather but was able to be transitioned back to 4L [place]. EKG revealed RBBB which is chronic. CMP revealed NA 149, K 3.4, BUN 38, Cr 1.68, glu 136. CBC reveals luekocytosis to 14.8, Hgb 12.4 (chronic), and plts 272. UA with small hgb but no RBCs noted; 20 ketones, >300 protein, large leuks and rare bacteria. CXR reveals right sided consolidation and left sided developing infiltrates. He was given 1L NS bolus, started on zosyn and pharmacy was consulted to dose vanc based on vanc level as he was reported to be receiving this at his SNF. IMTS asked to admit.   Meds:  Current Meds  Medication Sig  . amLODipine (NORVASC) 10 MG tablet Take 10 mg by mouth every morning.   . carvedilol (COREG) 12.5 MG tablet Take 1 tablet (12.5 mg total) by mouth 2 (two) times daily.  . cefTRIAXone (ROCEPHIN) 1 g injection Inject 1 g into the muscle daily. Started on 03-18-19 Duration 6 days  . Cholecalciferol (VITAMIN D) 125 MCG (5000 UT) CAPS Take 5,000 Units  by mouth daily.   . clopidogrel (PLAVIX) 75 MG tablet Take 75 mg by mouth at bedtime.  . diclofenac sodium (VOLTAREN) 1 % GEL Apply 4 g topically 4 (four) times daily. Apply to Bilateral Knees  . DOCUSATE SODIUM PO Take 1 capsule by mouth every 12 (twelve) hours as needed.  . donepezil (ARICEPT) 10 MG tablet Take 1 tablet (10 mg total) by mouth at bedtime.  . hydrALAZINE (APRESOLINE) 50 MG tablet Take 75 mg by mouth 4 (four) times daily.   . insulin glargine (LANTUS) 100 UNIT/ML injection Inject 6 Units into the skin daily.   . insulin lispro (HUMALOG) 100 UNIT/ML injection Inject 0-5 Units into the skin 3 (three) times daily before meals. Sliding scale 0-70= 0 units, 71-150= 0 units,  151-300= 3 units, 301-399=5 units, 400+ above call MD  . ipratropium-albuterol (DUONEB) 0.5-2.5 (3) MG/3ML SOLN Take 1 mL by nebulization every 6 (six) hours as needed (shortness of breath).  Marland Kitchen LACTOBACILLUS PO Take 1 capsule by mouth 2 (two) times daily. X 10 days, started on 03-17-19  . lisinopril (ZESTRIL) 40 MG tablet Take 40 mg by mouth daily.   . memantine (NAMENDA XR) 28 MG CP24 24 hr capsule Take 1 capsule (28 mg total) by mouth daily.  . metFORMIN (GLUCOPHAGE) 500 MG tablet Take 500 mg by mouth daily.  . mirabegron ER (MYRBETRIQ) 50 MG TB24 tablet Take 50 mg by mouth daily.  . nitroGLYCERIN (NITROSTAT) 0.4 MG SL tablet Place 0.4 mg under the tongue every 5 (five) minutes as needed for chest pain.  . Nutritional Supplements (NUTRITIONAL SUPPLEMENT PO) Low Concentrated Sweets Diet - regular texture, Regular / thin consistency  . Polyethyl Glycol-Propyl Glycol (SYSTANE) 0.4-0.3 % SOLN Apply 1 drop to eye 2 (two) times daily. Both eyes  . polyethylene glycol (MIRALAX / GLYCOLAX) packet Take 17 g by mouth daily.  . prednisoLONE acetate (PRED FORTE) 1 % ophthalmic suspension Place 1 drop into the left eye 4 (four) times daily.  Marland Kitchen senna (SENOKOT) 8.6 MG tablet Take 1 tablet by mouth every 12 (twelve) hours as needed for constipation.  . sertraline (ZOLOFT) 50 MG tablet Take 50 mg by mouth daily.   . simvastatin (ZOCOR) 20 MG tablet Take 1 tablet (20 mg total) by mouth at bedtime.  . tamsulosin (FLOMAX) 0.4 MG CAPS capsule Take 0.4 mg by mouth at bedtime.  Marland Kitchen tiZANidine (ZANAFLEX) 2 MG tablet Take 2 mg by mouth 2 (two) times daily.  Marland Kitchen torsemide (DEMADEX) 10 MG tablet Take 10 mg by mouth daily.  . traMADol (ULTRAM) 50 MG tablet Take 1 tablet (50 mg total) by mouth every 6 (six) hours as needed for moderate pain or severe pain.   Allergies: Allergies as of 03/20/2019 - Review Complete 03/20/2019  Allergen Reaction Noted  . Pioglitazone Other (See Comments)   . Rosiglitazone maleate Other (See  Comments)    Past Medical History:  Diagnosis Date  . Arthritis    "maybe"  . 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 CHF (congestive heart failure) ([place])   . Depression   . 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 Hyperlipemia   . Hypertension   . Sleep apnea    "my machine [place]; got to get me another one" (09/01/2014)  . Stroke [facility]) 2009   left thalamic infarct,  residual LUE weakness  . TIA (transient ischemic attack)    multiple in past, most recently in 2011  . Type II diabetes mellitus ([facility])     Family History:  Family History  Problem Relation Age of Onset  . Stroke Mother   . Pneumonia Father   . Diabetes Sister   . Diabetes Brother   . Diabetes Paternal Aunt   . 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 His HCPOA is his daughter Donnald Garre.   Review of Systems: A complete ROS was negative except as per HPI.   Physical Exam: Blood pressure (!) 192/84, pulse 91, temperature (!) 101.6 F (38.7 C), temperature source Rectal, resp. rate (!) 25, SpO2 100 %. Neuro: Lying in bed in no acute distress, he does open his eyes with verbal and tactile stimuli, he mentally follows commands.  He does not answer any of her questions. Cardiovascular: Normal rate, regular rhythm Respiratory: Difficult to auscultate lungs due to patient's inability to follow commands. Abdomen: Nontender palpation, soft, no mass appreciated.   GU: Suprapubic catheter without erythema drainage at the insertion site.  No hematuria or other discoloration noted in his urinary bag. MSK: No lower extremity mid, erythema, or warmth noted bilaterally Skin: Warm and dry Psych: Unable to assess due to patient's altered mental status.  EKG: personally reviewed my interpretation is sinus tachycardia with  a heart rate of 103, chronic right bundle branch block  CXR: personally reviewed my interpretation is large right sided consolidation, early left basilar infiltrate.  Assessment & Plan by Problem: Principal Problem:   Sepsis ([place]) Active Problems:   AKI (acute kidney injury) ([place])   Community acquired pneumonia   Hypertensive urgency  [name] is a 78yo male with hyperaldosteronism, HTN, T2DM, neurogenic bladder s/p suprapubic catheter, h/o CVA with left sided hemiparesis, and vascular dementia who presented with non-resolving pneumonia.   Sepsis 2/2 Aspiration PNA: Initially treated with 1 day of ertapenem in addition to metronidazole and ceftriaxone. He may have had a mucous plug which acutely increased his supplemental oxygen needs as he is now on 2L. - S/p Zosyn - Will switch to ampicillin-sulbactam (Unasyn) for anerobic/strep pnuemo coverage. - Urinary strep pneumococcal antigen. - S/p 1 L NS, will add 1/2 NS maintenance fluid.  - Blood cultures pending. - Continue supplemental oxygen as needed. - Tylenol PRN pain and fever - Continuous cardiac monitoring  Acute kidney injury: Creatinine elevated 1.68 (baseline around 0.9): Likely due to underlying sepsis. - S/p 1 L IV fluid - Start maintenance IV fluids as above - SLP eval  Hypernatremia: Likely due to decreased p.o. intake. Received 1 L NS. Free water deficit 3.2 L. - Start 1/2 NS as above  Abnormal urinalysis: UA with small hemoglobin but no RBCs.  Also showed proteinuria, leukocytes, and rare bacteria.  Likely due to asymptomatic bacteriuria. Continue to monitor.   Type 2 diabetes: Home meds include Lantus 6 units daily, Humalog SSI 3 times daily with meals, metformin 500 mg daily. - Continue Lantus 6 units daily - NovoLog SSI 3 times daily with meals - Will hold metformin at this time  Hypertensive urgency: Blood pressure elevated with max of 223/102. Home meds include amlodipine 10 mg daily, carvedilol 12.5 mg  twice daily, hydralazine 75 mg 4 times daily, lisinopril 40 mg daily.  - Continue amlodipine, carvedilol and hydralazine.  Hold lisinopril in the setting of AKI.  Dementia/MDD: -Continue donezepil  10 mg daily, memantine 20 mg daily, and sertraline 50 mg daily.  Overactive bladder: - Continue mirabegron  History of CVA:  - Continue home Plavix  FEN/GI: N.p.o. in the setting of altered mental status. DVT prophylaxis: Lovenox CODE STATUS: DNR  Dispo: Admit patient to Inpatient with expected length of stay greater than 2 midnights.  Signed: [name], MD 03/20/2019, 10:59 AM

## 2019-03-20 NOTE — ED Notes (Signed)
Attempted to call report to [place], [place]

## 2019-03-20 NOTE — Progress Notes (Signed)
Pharmacy Antibiotic Note  [name] is a 78 y.o. male admitted on 03/20/2019 with pneumonia.  Pharmacy has been consulted for unasyn dosing. Pt is febrile with Tmax 101.6 and WBC is elevated at 14.8. Scr is elevated above baseline. Reports of recent vancomycin but level is undetectable. Per medication history, pt was on ceftriaxone PTA.   Plan: Unasyn 3gm IV Q8H F/u renal fxn, C&S, clinical status     Temp (24hrs), Avg:101.6 F (38.7 C), Min:101.6 F (38.7 C), Max:101.6 F (38.7 C)  Recent Labs  Lab 03/20/19 0827 03/20/19 0914  WBC 14.8*  --   CREATININE 1.68*  --   LATICACIDVEN 1.7  --   VANCORANDOM  --  <4    CrCl cannot be calculated (Unknown ideal weight.).    Allergies  Allergen Reactions  . Pioglitazone Other (See Comments)    Edema   . Rosiglitazone Maleate Other (See Comments)    edema    Antimicrobials this admission: Unasyn 6/5>> Vanc x 1 6/5 Zosyn x 1 6/5  Dose adjustments this admission: N/A  Microbiology results: Pending  Thank you for allowing pharmacy to be a part of this patient's care.  Caelin Rayl, Rande Lawman 03/20/2019 1:28 PM

## 2019-03-20 NOTE — ED Notes (Signed)
EDP made aware of pt's BP staying up in the SBP 190's.

## 2019-03-20 NOTE — ED Provider Notes (Signed)
[facility] Emergency Department Provider Note MRN:  [id number]  Arrival date & time: 03/20/19     Chief Complaint   Fever; Pneumonia; and Hypertension   History of Present Illness   [name] is a 78 y.o. year-old male with a history of CHF, CVA, diabetes presenting to the ED with chief complaint of fever.  Patient is arriving from long-term care facility, has been receiving treatment for pneumonia, getting worse despite IV antibiotics.  I was unable to obtain an accurate HPI, PMH, or ROS due to the patient's altered mental status.  Review of Systems  Positive for fever, cough, pneumonia, altered mental status.  Patient's Health History    Past Medical History:  Diagnosis Date  . Arthritis    "maybe"  . 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 CHF (congestive heart failure) ([place])   . Depression   . 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 Hyperlipemia   . Hypertension   . Sleep apnea    "my machine [place]; got to get me another one" (09/01/2014)  . Stroke [facility]) 2009   left thalamic infarct, residual LUE weakness  . TIA (transient ischemic attack)    multiple in past, most recently in 2011  . Type II diabetes mellitus ([place])     Past Surgical History:  Procedure Laterality Date  . CARDIAC CATHETERIZATION  07/2010  . CRANIOTOMY  05/2008   for clipping of an incidental asymptomatic unruptured/notes 02/13/2011  . KNEE ARTHROSCOPY Left   . 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 SHOULDER ARTHROSCOPY W/ ROTATOR CUFF REPAIR Right     Family History  Problem Relation Age of Onset  . Stroke Mother   . Pneumonia Father   . Diabetes Sister   . Diabetes Brother   . Diabetes Paternal Aunt   . Colon cancer Neg Hx     Social History   Socioeconomic History  . Marital status:  Married    Spouse name: [name]  . Number of children: 3  . Years of education: HS  . Highest education level: Not on file  Occupational History  . Occupation: disabled  Social Needs  . Financial resource strain: Not hard at all  . Food insecurity:    Worry: Never true    Inability: Never true  . Transportation needs:    Medical: No    Non-medical: No  Tobacco Use  . Smoking status: Former Smoker    Packs/day: 2.00    Years: 20.00    Pack years: 40.00    Types: Cigarettes    Last attempt to quit: 10/16/1983    Years since quitting: 35.4  . Smokeless tobacco: Never Used  Substance and Sexual Activity  . Alcohol use: No    Alcohol/week: 0.0 standard drinks  . Drug use: No  . Sexual activity: Never  Lifestyle  . Physical activity:    Days per week: 0 days    Minutes per session: 0 min  . Stress: Not at all  Relationships  . Social connections:    Talks on phone: Three times a week    Gets together: Three times a week    Attends religious service: Never    Active member of club or organization: No    Attends meetings of clubs or organizations: Never  Relationship status: Married  . Intimate partner violence:    Fear of current or ex partner: No    Emotionally abused: No    Physically abused: No    Forced sexual activity: No  Other Topics Concern  . Not on file  Social History Narrative  . Not on file     Physical Exam  Vital Signs and Nursing Notes reviewed Vitals:   03/20/19 1115 03/20/19 1145  BP: (!) 184/106 (!) 194/90  Pulse:  88  Resp: 20 19  Temp:    SpO2:  100%    CONSTITUTIONAL: Ill-appearing, NAD NEURO: Somnolent, wakes to voice, confused EYES:  eyes equal and reactive ENT/NECK:  no LAD, no JVD CARDIO: Tachycardic rate, well-perfused, normal S1 and S2 PULM: Diffuse rhonchi, decreased right-sided breath sounds, mildly tachypneic GI/GU:  normal bowel sounds, non-distended, non-tender MSK/SPINE:  No gross deformities, no edema SKIN:  no rash,  atraumatic PSYCH:  Appropriate speech and behavior  Diagnostic and Interventional Summary    EKG Interpretation  Date/Time:  Friday March 20 2019 08:15:25 EDT Ventricular Rate:  103 PR Interval:    QRS Duration: 141 QT Interval:  334 QTC Calculation: 438 R Axis:   -94 Text Interpretation:  Sinus tachycardia Atrial premature complex Right bundle branch block Confirmed by Gerlene Fee [phone]) on 03/20/2019 8:48:32 AM      Labs Reviewed  COMPREHENSIVE METABOLIC PANEL - Abnormal; Notable for the following components:      Result Value   Sodium 149 (*)    Potassium 3.4 (*)    Glucose, Bld 136 (*)    BUN 38 (*)    Creatinine, Ser 1.68 (*)    Albumin 3.0 (*)    AST 13 (*)    GFR calc non Af Amer 39 (*)    GFR calc Af Amer 45 (*)    All other components within normal limits  CBC WITH DIFFERENTIAL/PLATELET - Abnormal; Notable for the following components:   WBC 14.8 (*)    Hemoglobin 12.4 (*)    MCH 25.1 (*)    MCHC 29.0 (*)    RDW 16.9 (*)    nRBC 0.4 (*)    Neutro Abs 12.4 (*)    All other components within normal limits  URINALYSIS, ROUTINE W REFLEX MICROSCOPIC - Abnormal; Notable for the following components:   APPearance TURBID (*)    pH 9.0 (*)    Hgb urine dipstick SMALL (*)    Ketones, ur 20 (*)    Protein, ur >=300 (*)    Leukocytes,Ua LARGE (*)    Bacteria, UA RARE (*)    Non Squamous Epithelial 0-5 (*)    All other components within normal limits  CBG MONITORING, ED - Abnormal; Notable for the following components:   Glucose-Capillary 121 (*)    All other components within normal limits  SARS CORONAVIRUS 2  CULTURE, BLOOD (ROUTINE X 2)  CULTURE, BLOOD (ROUTINE X 2)  LACTIC ACID, PLASMA  VANCOMYCIN, RANDOM  LACTIC ACID, PLASMA    DG Chest Port 1 View  Final Result      Medications  vancomycin (VANCOCIN) IVPB 1000 mg/200 mL premix (1,000 mg Intravenous Bolus from Bag 03/20/19 1149)  sodium chloride 0.9 % bolus 1,000 mL (0 mLs Intravenous Stopped 03/20/19  1147)  piperacillin-tazobactam (ZOSYN) IVPB 3.375 g (0 g Intravenous Stopped 03/20/19 1036)     Procedures Critical Care Critical Care Documentation Critical care time provided by me (excluding procedures): 38 minutes  Condition necessitating critical care: Sepsis  Components of critical care management: reviewing of prior records, laboratory and imaging interpretation, frequent re-examination and reassessment of vital signs, administration of IV fluids, IV antibiotics, discussion with consulting services.    ED Course and Medical Decision Making  I have reviewed the triage vital signs and the nursing notes.  Pertinent labs & imaging results that were available during my care of the patient were reviewed by me and considered in my medical decision making (see below for details).  Concern for sepsis and refractory pneumonia in this 78 year old male coming from [place].  Worsening despite IV vancomycin at facility, provided with Zosyn, IV fluids, admitted to hospital service.   Barth Kirks. Sedonia Small, [place] [email or web address]  Final Clinical Impressions(s) / ED Diagnoses     ICD-10-CM   1. Healthcare-associated pneumonia J18.9   2. Sepsis, due to unspecified organism, unspecified whether acute organ dysfunction present [facility]) A41.9     ED Discharge Orders    None         Maudie Flakes, MD 03/20/19 1205

## 2019-03-21 LAB — BASIC METABOLIC PANEL
Anion gap: 10 (ref 5–15)
Anion gap: 3 — ABNORMAL LOW (ref 5–15)
BUN: 34 mg/dL — ABNORMAL HIGH (ref 8–23)
BUN: 27 mg/dL — ABNORMAL HIGH (ref 8–23)
CO2: 30 mmol/L (ref 22–32)
CO2: 32 mmol/L (ref 22–32)
Calcium: 9.4 mg/dL (ref 8.9–10.3)
Calcium: 9.2 mg/dL (ref 8.9–10.3)
Chloride: 109 mmol/L (ref 98–111)
Chloride: 110 mmol/L (ref 98–111)
Creatinine, Ser: 1.31 mg/dL — ABNORMAL HIGH (ref 0.61–1.24)
Creatinine, Ser: 1.26 mg/dL — ABNORMAL HIGH (ref 0.61–1.24)
GFR calc Af Amer: >60 mL/min (ref >60)
GFR calc Af Amer: >60 mL/min (ref >60)
GFR calc non Af Amer: 53 mL/min — ABNORMAL LOW (ref >60)
GFR calc non Af Amer: 55 mL/min — ABNORMAL LOW (ref >60)
Glucose, Bld: 236 mg/dL — ABNORMAL HIGH (ref 70–99)
Glucose, Bld: 140 mg/dL — ABNORMAL HIGH (ref 70–99)
Potassium: 3.2 mmol/L — ABNORMAL LOW (ref 3.5–5.1)
Potassium: 3.5 mmol/L (ref 3.5–5.1)
Sodium: 149 mmol/L — ABNORMAL HIGH (ref 135–145)
Sodium: 145 mmol/L (ref 135–145)

## 2019-03-21 LAB — LEGIONELLA PNEUMOPHILA SEROGP 1 UR AG: L. pneumophila Serogp 1 Ur Ag: NEGATIVE

## 2019-03-21 LAB — CBC
HCT: 39.4 % (ref 39.0–52.0)
Hemoglobin: 11.6 g/dL — ABNORMAL LOW (ref 13.0–17.0)
MCH: 24.7 pg — ABNORMAL LOW (ref 26.0–34.0)
MCHC: 29.4 g/dL — ABNORMAL LOW (ref 30.0–36.0)
MCV: 84 fL (ref 80.0–100.0)
Platelets: 263 10*3/uL (ref 150–400)
RBC: 4.69 MIL/uL (ref 4.22–5.81)
RDW: 16.6 % — ABNORMAL HIGH (ref 11.5–15.5)
WBC: 14.6 10*3/uL — ABNORMAL HIGH (ref 4.0–10.5)
nRBC: 0.6 % — ABNORMAL HIGH (ref 0.0–0.2)

## 2019-03-21 LAB — GLUCOSE, CAPILLARY
Glucose-Capillary: 191 mg/dL — ABNORMAL HIGH (ref 70–99)
Glucose-Capillary: 134 mg/dL — ABNORMAL HIGH (ref 70–99)
Glucose-Capillary: 134 mg/dL — ABNORMAL HIGH (ref 70–99)

## 2019-03-21 LAB — MRSA PCR SCREENING: MRSA by PCR: NEGATIVE

## 2019-03-21 MED ORDER — HYDRALAZINE HCL 20 MG/ML IJ SOLN
10.0000 mg | Freq: Once | INTRAMUSCULAR | Status: AC
  Administered 2019-03-21: 10 mg via INTRAVENOUS
  Filled 2019-03-21: qty 1

## 2019-03-21 MED ORDER — SODIUM CHLORIDE 0.9% FLUSH
10.0000 mL | Freq: Two times a day (BID) | INTRAVENOUS | Status: DC
  Administered 2019-03-21 – 2019-03-24 (×6): 10 mL

## 2019-03-21 MED ORDER — DEXTROSE 5 % IV BOLUS
1000.0000 mL | Freq: Once | INTRAVENOUS | Status: AC
  Administered 2019-03-21: 12:00:00 1000 mL via INTRAVENOUS

## 2019-03-21 MED ORDER — PIPERACILLIN-TAZOBACTAM 3.375 G IVPB
3.3750 g | Freq: Three times a day (TID) | INTRAVENOUS | Status: DC
  Administered 2019-03-21 – 2019-03-24 (×9): 3.375 g via INTRAVENOUS
  Filled 2019-03-21 (×9): qty 50

## 2019-03-21 MED ORDER — POTASSIUM CHLORIDE 10 MEQ/100ML IV SOLN
10.0000 meq | INTRAVENOUS | Status: AC
  Administered 2019-03-21 (×4): 10 meq via INTRAVENOUS
  Filled 2019-03-21 (×4): qty 100

## 2019-03-21 MED ORDER — SODIUM CHLORIDE 0.9% FLUSH: 10.0000 mL | INTRAVENOUS | Status: DC | PRN

## 2019-03-21 MED ORDER — SODIUM CHLORIDE 0.9 % IV SOLN
INTRAVENOUS | Status: DC | PRN
  Administered 2019-03-21 – 2019-03-26 (×4): 250 mL via INTRAVENOUS

## 2019-03-21 MED ORDER — ORAL CARE MOUTH RINSE
15.0000 mL | Freq: Two times a day (BID) | OROMUCOSAL | Status: DC
  Administered 2019-03-21 – 2019-03-24 (×7): 15 mL via OROMUCOSAL

## 2019-03-21 MED ORDER — DEXTROSE 5 % IV SOLN
INTRAVENOUS | Status: DC
  Administered 2019-03-21: 21:00:00 via INTRAVENOUS

## 2019-03-21 MED ORDER — HYDRALAZINE HCL 20 MG/ML IJ SOLN
10.0000 mg | Freq: Once | INTRAMUSCULAR | Status: AC
  Administered 2019-03-21: 11:00:00 10 mg via INTRAVENOUS
  Filled 2019-03-21: qty 1

## 2019-03-21 NOTE — Progress Notes (Signed)
Left a message for Dr. Alfonse Spruce to call family at [phone].

## 2019-03-21 NOTE — Progress Notes (Signed)
   Subjective: [name] does seem more alert this morning.  He easily awakes to verbal and tactile stimuli.  He is unable to tell me where he is at or what year it is.  He is oriented to person.  He answers all of my questions and does not have any acute complaints.  When asked if I can examine him he says "yes you can, just do not shoot me hahaha".   Objective:  Vital signs in last 24 hours: Vitals:   03/20/19 2240 03/20/19 2303 03/21/19 0525 03/21/19 0925  BP:  (!) 194/70 (!) 193/84 (!) 185/71  Pulse:  60 70 88  Resp:    14  Temp:  98.3 F (36.8 C) 99.4 F (37.4 C) (!) 100.4 F (38 C)  TempSrc:  Oral  Oral  SpO2:  93% 98% 98%  Weight: 74.3 kg     Height: 5\' 7"  (1.702 m)      General: Lying in bed in no acute distress Cardiovascular: Normal rate, regular rhythm Respiratory: Normal work of breathing, currently on 2 L supplemental oxygen, auscultated lungs anteriorly- coarse breath sounds heard, decreased breath sounds at the lung base bilaterally Neuro: Oriented to person only.  He is able to follow commands, good grip strength (4/5 strength), he lifts both legs off of the bed (4/5 strength).  Assessment/Plan:  Principal Problem:   Sepsis ([place]) Active Problems:   AKI (acute kidney injury) ([place])   Community acquired pneumonia   Hypertensive urgency   Aspiration pneumonia [facility])   [name] is a 78yo male with hyperaldosteronism, HTN, T2DM, neurogenic bladder s/p suprapubic catheter, h/o CVA with left sided hemiparesis, and vascular dementia who presented with non-resolving pneumonia.   Sepsis 2/2 Aspiration PNA:  He is currently on Unasyn (day 5 antibiotics).  His mental status does seem much improved from yesterday.  Leukocytosis stable at 14.6.  Creatinine has also improved to 1.31 (1.68 on admission).  Legionella and strep pneumo urinary antigen negative. - Continue Unasyn - Continue IV fluids  - Blood cultures NGTD x1 day. - Continue supplemental oxygen as needed.  - Keep n.p.o. until SLP eval.  He failed his bedside swallow this morning due to altered mental status.  Will plan on only IV medications until we repeat his bedside swallow this afternoon. - Tylenol PRN pain and fever - Continuous cardiac monitoring  Acute kidney injury: Creatinine stable at 1.31 (baseline around 0.9): Likely due to underlying sepsis. - Continue IV fluids  Hypernatremia: Likely due to decreased p.o. intake. Remains at 149 this morning.  - Given additional 1 L D5W.  Type 2 diabetes: Blood sugar within acceptable limits on home Lantus 60 units daily and SSI. - Continue Lantus 6 units daily - Continue NovoLog SSI 3 times daily with meals  Hypertensive urgency: Blood pressure remains elevated (SBP 180's-190's).  He has not been able to take any of his p.o. medications due to altered mental status.  Currently on hydralazine IV 10 mg as needed. - Hold home p.o. amlodipine, carvedilol and hydralazine until repeat bedside swallow exam.   - Hydralazine 10 mg every 6 hours PRN  Dementia/MDD: -Continue donezepil 10 mg daily, memantine 20 mg daily, and sertraline 50 mg daily.  Overactive bladder: - Continue mirabegron  History of CVA:  - Continue home Plavix  FEN/GI: N.p.o., IV fluids as above DVT prophylaxis: Lovenox CODE STATUS: Full  Dispo: Anticipated discharge in approximately 1-2 days.  [name], MD 03/21/2019, 11:38 AM Pager: [phone]

## 2019-03-21 NOTE — Progress Notes (Signed)
Pharmacy Antibiotic Note  [name] is a 78 y.o. male admitted on 03/20/2019 with pneumonia.  Pharmacy has been consulted for unasyn dosing. Pt is febrile with Tmax 101.6 and WBC is elevated at 14.8. Scr is elevated above baseline but improving. CrCl ~ 45 ml/min.  Plan: Unasyn 3gm IV Q8H F/u renal fxn, C&S, clinical status Anticipate no further dose adjustments needed.  Rx will sign off.  [employer], Pharm.D., BCPS Clinical Pharmacist  **Pharmacist phone directory can now be found on [email or web address] ([email or web address]).  Listed under [place].  03/21/2019 11:33 AM

## 2019-03-21 NOTE — Progress Notes (Signed)
Pharmacy Antibiotic Note  [name] is a 78 y.o. male admitted on 03/20/2019 with pneumonia. Patient has been on Unasyn but continues to be febrile with elevated WBC. Pharmacy has been consulted for Zosyn dosing. Scr 1.31, estimated CrCl ~45 ml/min.  Plan: Zosyn 3.375g IV q8h F/u clinical status, C&S, renal function, de-escalation, LOT  Height: 5\' 7"  (170.2 cm) Weight: 163 lb 12.8 oz (74.3 kg) IBW/kg (Calculated) : 66.1  Temp (24hrs), Avg:99.8 F (37.7 C), Min:98.3 F (36.8 C), Max:100.5 F (38.1 C)  Recent Labs  Lab 03/20/19 0827 03/20/19 0914 03/20/19 2030 03/21/19 0600  WBC 14.8*  --   --  14.6*  CREATININE 1.68*  --  1.39* 1.31*  LATICACIDVEN 1.7  --   --   --   VANCORANDOM  --  <4  --   --     Estimated Creatinine Clearance: 45.6 mL/min (A) (by C-G formula based on SCr of 1.31 mg/dL (H)).    Allergies  Allergen Reactions  . Pioglitazone Other (See Comments)    Edema   . Rosiglitazone Maleate Other (See Comments)    edema   Antimicrobials this admission: Zosyn 6/5 x1, 6/6 >> Unasyn 6/5 >> 6/6 Vancomycin 6/5 x1  Microbiology results: 6/5 MRSA PCR: neg 6/5 COVID: neg 6/5 BCx: NG < 24h  Thank you for allowing pharmacy to be a part of this patient's care.  Mila Merry Gerarda Fraction, PharmD, [place] PGY2 Infectious Diseases Pharmacy Resident Phone: [phone] 03/21/2019 3:56 PM

## 2019-03-21 NOTE — Progress Notes (Signed)
SLP Cancellation Note  Patient Details Name: [name] MRN: [id number] DOB: 1941-09-02   Cancelled treatment:       Reason Eval/Treat Not Completed: Fatigue/lethargy limiting ability to participate; pt with fever and unable to arouse.   Elvina Sidle, M.S., CCC-SLP 03/21/2019, 2:08 PM

## 2019-03-21 NOTE — Progress Notes (Signed)
I spoke to Ms. [name] to update her about [name].  I explained that we are treating aspiration pneumonia with Unasyn IV.  He does seem to be improving somewhat.  She inquired about NG tube placement for feeding.  I explained that we are not at that point.  He is currently getting fluids containing dextrose and will be evaluated by speech therapy. She expressed understanding.  All other questions and concerns were answered.

## 2019-03-21 NOTE — Progress Notes (Signed)
Paged the phone number in the chart for MD about blood pressure, failing yale swallow screen and baseline mobility.

## 2019-03-21 NOTE — Progress Notes (Signed)
Called pager number and got a call back. Left a message for Dr. Alfonse Spruce that temp & bp had not reduced with recent meds and that pt had followed 2nd yale swallow screen for today.

## 2019-03-22 ENCOUNTER — Inpatient Hospital Stay (HOSPITAL_COMMUNITY): Payer: Medicare Other

## 2019-03-22 DIAGNOSIS — E871 Hypo-osmolality and hyponatremia: Secondary | ICD-10-CM

## 2019-03-22 LAB — BLOOD CULTURE ID PANEL (REFLEXED)

## 2019-03-22 LAB — BASIC METABOLIC PANEL
Anion gap: 8 (ref 5–15)
Anion gap: 8 (ref 5–15)
BUN: 23 mg/dL (ref 8–23)
BUN: 21 mg/dL (ref 8–23)
CO2: 29 mmol/L (ref 22–32)
CO2: 28 mmol/L (ref 22–32)
Calcium: 9.4 mg/dL (ref 8.9–10.3)
Calcium: 8.9 mg/dL (ref 8.9–10.3)
Chloride: 113 mmol/L — ABNORMAL HIGH (ref 98–111)
Chloride: 107 mmol/L (ref 98–111)
Creatinine, Ser: 1.22 mg/dL (ref 0.61–1.24)
Creatinine, Ser: 1.28 mg/dL — ABNORMAL HIGH (ref 0.61–1.24)
GFR calc Af Amer: >60 mL/min (ref >60)
GFR calc Af Amer: >60 mL/min (ref >60)
GFR calc non Af Amer: 58 mL/min — ABNORMAL LOW (ref >60)
GFR calc non Af Amer: 54 mL/min — ABNORMAL LOW (ref >60)
Glucose, Bld: 122 mg/dL — ABNORMAL HIGH (ref 70–99)
Glucose, Bld: 197 mg/dL — ABNORMAL HIGH (ref 70–99)
Potassium: 3.4 mmol/L — ABNORMAL LOW (ref 3.5–5.1)
Potassium: 3.1 mmol/L — ABNORMAL LOW (ref 3.5–5.1)
Sodium: 150 mmol/L — ABNORMAL HIGH (ref 135–145)
Sodium: 143 mmol/L (ref 135–145)

## 2019-03-22 LAB — CBC WITH DIFFERENTIAL/PLATELET
Abs Immature Granulocytes: 0.08 10*3/uL — ABNORMAL HIGH (ref 0.00–0.07)
Basophils Absolute: 0 10*3/uL (ref 0.0–0.1)
Basophils Relative: 0 %
Eosinophils Absolute: 0 10*3/uL (ref 0.0–0.5)
Eosinophils Relative: 0 %
HCT: 40.7 % (ref 39.0–52.0)
Hemoglobin: 12.2 g/dL — ABNORMAL LOW (ref 13.0–17.0)
Immature Granulocytes: 1 %
Lymphocytes Relative: 10 %
Lymphs Abs: 1.4 10*3/uL (ref 0.7–4.0)
MCH: 25 pg — ABNORMAL LOW (ref 26.0–34.0)
MCHC: 30 g/dL (ref 30.0–36.0)
MCV: 83.4 fL (ref 80.0–100.0)
Monocytes Absolute: 1.1 10*3/uL — ABNORMAL HIGH (ref 0.1–1.0)
Monocytes Relative: 8 %
Neutro Abs: 10.9 10*3/uL — ABNORMAL HIGH (ref 1.7–7.7)
Neutrophils Relative %: 81 %
Platelets: 282 10*3/uL (ref 150–400)
RBC: 4.88 MIL/uL (ref 4.22–5.81)
RDW: 16.7 % — ABNORMAL HIGH (ref 11.5–15.5)
WBC: 13.4 10*3/uL — ABNORMAL HIGH (ref 4.0–10.5)
nRBC: 0.4 % — ABNORMAL HIGH (ref 0.0–0.2)

## 2019-03-22 LAB — GLUCOSE, CAPILLARY
Glucose-Capillary: 280 mg/dL — ABNORMAL HIGH (ref 70–99)
Glucose-Capillary: 205 mg/dL — ABNORMAL HIGH (ref 70–99)

## 2019-03-22 MED ORDER — LABETALOL HCL 5 MG/ML IV SOLN
10.0000 mg | INTRAVENOUS | Status: DC
  Administered 2019-03-22 – 2019-03-25 (×28): 10 mg via INTRAVENOUS
  Filled 2019-03-22 (×21): qty 4

## 2019-03-22 MED ORDER — DEXTROSE 5 % IV SOLN
250.0000 mg | INTRAVENOUS | Status: DC
  Administered 2019-03-23: 13:00:00 250 mg via INTRAVENOUS
  Filled 2019-03-22 (×3): qty 250

## 2019-03-22 MED ORDER — LABETALOL HCL 5 MG/ML IV SOLN
5.0000 mg | Freq: Once | INTRAVENOUS | Status: AC
  Administered 2019-03-22: 10:00:00 5 mg via INTRAVENOUS

## 2019-03-22 MED ORDER — SODIUM CHLORIDE 0.9 % IV SOLN
500.0000 mg | Freq: Once | INTRAVENOUS | Status: AC
  Administered 2019-03-22: 500 mg via INTRAVENOUS
  Filled 2019-03-22: qty 500

## 2019-03-22 MED ORDER — LABETALOL HCL 5 MG/ML IV SOLN
5.0000 mg | INTRAVENOUS | Status: DC
  Administered 2019-03-22 (×2): 5 mg via INTRAVENOUS

## 2019-03-22 MED ORDER — LABETALOL HCL 5 MG/ML IV SOLN
5.0000 mg | INTRAVENOUS | Status: DC | PRN
  Administered 2019-03-22: 5 mg via INTRAVENOUS
  Filled 2019-03-22: qty 4

## 2019-03-22 MED ORDER — DEXTROSE 5 % IV SOLN
INTRAVENOUS | Status: AC
  Administered 2019-03-22: 12:00:00 via INTRAVENOUS

## 2019-03-22 MED ORDER — DEXTROSE 5 % IV BOLUS
1000.0000 mL | Freq: Once | INTRAVENOUS | Status: AC
  Administered 2019-03-22: 11:00:00 1000 mL via INTRAVENOUS

## 2019-03-22 NOTE — Progress Notes (Signed)
   Subjective: [name] opens his eyes to verbal stimuli. When asked what is bothering him he states "I'm too old".  He does not answer any of my other questions.  Objective:  Vital signs in last 24 hours: Vitals:   03/22/19 0741 03/22/19 0900 03/22/19 1000 03/22/19 1150  BP: (!) 181/91 (!) 187/83 (!) 186/87 (!) 199/83  Pulse:      Resp: 18   18  Temp: 99.8 F (37.7 C)   (!) 100.4 F (38 C)  TempSrc: Axillary   Oral  SpO2: 99%   100%  Weight:      Height:       General: Lying in bed in no acute distress Neuro: He easily arouses to verbal stimuli.  He does not follow commands. Cardiovascular: Normal rate, regular rhythm Respiratory: Crackles heard at the lung bases bilaterally, normal work of breathing, on 2 L supplemental oxygen.  Assessment/Plan:  Principal Problem:   Sepsis ([place]) Active Problems:   AKI (acute kidney injury) ([place])   Community acquired pneumonia   Hypertensive urgency   Aspiration pneumonia [facility])  [name] is a 78yo male with hyperaldosteronism, HTN, T2DM, neurogenic bladder s/p suprapubic catheter, h/o CVA with left sided hemiparesis, and vascular dementia who is being treated for aspiration pneumonia.  Sepsis 2/2 Aspiration PNA:  His antibiotic coverage was broadened to Unasyn and azithromycin for pseudomonal and atypical coverage.  He does continue to have fevers but repeat chest x-ray this morning does seem much improved.  Plan on continue to monitor while on antibiotics. - Continue Unasyn and azithromycin - Continue D5W today - Blood cultures no growth to date x1 day - Continue supplemental oxygen as needed. - Tylenol PRN pain and fever - Continuous cardiac monitoring  Acute kidney injury: Creatinine back to baseline with IV fluids. - Continue IV fluids as above  Hypernatremia:  Initially improved with IV fluids but increase to 150 with stopping D5W. -Give an additional D5W and repeat sodium this afternoon.  Type 2 diabetes: BS within  acceptable limits. - Continue Lantus 6 units daily - NovoLog SSI 3 times daily with meals  Hypertensive urgency: He is unable to take his home amlodipine, carvedilol, and hydralazine as he is altered and cannot take meds p.o.  He does continue to have elevated blood pressures from 330Q to 762 systolic.  Will switch him from hydralazine to labetalol and schedule it every 2 hours. - Labetalol 10 mg every 2 hours  Dementia/MDD: - Hold home donezepil 10 mg daily, memantine 20 mg daily, and sertraline 50 mg daily the setting of being n.p.o.  FEN/GI: N.p.o. in the setting of altered mental status. DVT prophylaxis: Lovenox CODE STATUS: DNR  Dispo: Anticipated discharge in approximately 2-3 days.  [name], MD 03/22/2019, 12:20 PM Pager: [phone]

## 2019-03-22 NOTE — Evaluation (Signed)
Clinical/Bedside Swallow Evaluation Patient Details  Name: [name] MRN: [id number] Date of Birth: 02/10/1941  Today's Date: 03/22/2019 Time: SLP Start Time (ACUTE ONLY): 1107 SLP Stop Time (ACUTE ONLY): 1118 SLP Time Calculation (min) (ACUTE ONLY): 11 min  Past Medical History:  Past Medical History:  Diagnosis Date  . Arthritis    "maybe"  . 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 CHF (congestive heart failure) ([place])   . Depression   . 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 Hyperlipemia   . Hypertension   . Sleep apnea    "my machine [place]; got to get me another one" (09/01/2014)  . Stroke [facility]) 2009   left thalamic infarct, residual LUE weakness  . TIA (transient ischemic attack)    multiple in past, most recently in 2011  . Type II diabetes mellitus ([place])    Past Surgical History:  Past Surgical History:  Procedure Laterality Date  . CARDIAC CATHETERIZATION  07/2010  . CRANIOTOMY  05/2008   for clipping of an incidental asymptomatic unruptured/notes 02/13/2011  . KNEE ARTHROSCOPY Left   . 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 SHOULDER ARTHROSCOPY W/ ROTATOR CUFF REPAIR Right    HPI:  78yo male with PMH of hyperaldosteronism, HTN, T2DM, neurogenic bladder s/p suprapubic catheter, h/o CVA with left sided hemiparesis, vascular dementia presenting from [place] [facility] due to nonresolving pneumonia   Assessment / Plan / Recommendation Clinical Impression  Pt continues to exhibit decreased alertness, impacting readiness for PO. RN notes pt will awake momentarily with verbal stimulation however does not sustain alertness. Pt awoke briefly for single ice chip via spoon following oral care with SLP. However prolonged mastication, delayed AP transit noted as well as suspected delay in swallow  initiation followed by weak cough concerning for poor airway protection. Continue NPO. Consider short term means of alternative nutrition per MD discretion. SLP to continue to monitor. SLP Visit Diagnosis: Dysphagia, unspecified (R13.10)    Aspiration Risk  Severe aspiration risk;Risk for inadequate nutrition/hydration    Diet Recommendation   NPO  Medication Administration: Via alternative means    Other  Recommendations Oral Care Recommendations: Oral care QID   Follow up Recommendations Skilled Nursing facility      Frequency and Duration            Prognosis Prognosis for Safe Diet Advancement: Fair Barriers to Reach Goals: Cognitive deficits;Severity of deficits      Swallow Study   General Date of Onset: 03/20/19 HPI: 78yo male with PMH of hyperaldosteronism, HTN, T2DM, neurogenic bladder s/p suprapubic catheter, h/o CVA with left sided hemiparesis, vascular dementia presenting from [place] [facility] due to nonresolving pneumonia Type of Study: Bedside Swallow Evaluation Previous Swallow Assessment: none on file Diet Prior to this Study: NPO Temperature Spikes Noted: Yes Respiratory Status: Nasal cannula History of Recent Intubation: No Behavior/Cognition: Doesn't follow directions;Lethargic/Drowsy Oral Care Completed by SLP: Yes Oral Cavity - Dentition: Missing dentition Vision: Impaired for self-feeding Self-Feeding Abilities: Total assist Patient Positioning: Upright in bed Baseline Vocal Quality: Not observed Volitional Cough: Cognitively unable to elicit Volitional Swallow: Unable to elicit    Oral/Motor/Sensory Function Overall Oral Motor/Sensory Function: Generalized oral weakness   Ice Chips Ice chips: Impaired Presentation: Spoon Oral Phase Functional Implications: Prolonged oral transit Pharyngeal Phase Impairments: Suspected  delayed Swallow;Multiple swallows;Cough - Delayed;Throat Clearing - Delayed   Thin Liquid Thin Liquid: Not tested    Nectar  Thick Nectar Thick Liquid: Not tested   Honey Thick Honey Thick Liquid: Not tested   Puree Puree: Not tested   Solid     Solid: Not tested      Aly Hauser E Riggin Cuttino MA, CCC-SLP Acute Rehabilitation Services 03/22/2019,11:36 AM

## 2019-03-22 NOTE — Progress Notes (Signed)
  Date: 03/22/2019  Patient name: [name]  Medical record number: [id number]  Date of birth: [birth year]   I have seen and evaluated this patient and I have discussed the plan of care with the house staff. Please see their note for complete details. I concur with their findings with the following additions/corrections:   78 year old man with prior stroke, vascular dementia, neurogenic bladder with suprapubic catheter, and T2DM admitted for sepsis due to pneumonia, likely aspiration.  Still having fevers, although overall fever curve has improved.  He received antibiotics at his nursing facility for a couple days before admission, so he is 5 days into his antibiotic course.  Ongoing fevers raise the concern for inadequate coverage or lack of source control.  CXR today actually appears improved with no evidence of large parapneumonic effusion or empyema.  Blood cultures are now growing gram-positive coccobacillus in 2/4 bottles, unclear if this is a pathogen or contaminant.  He has been broadened to piperacillin/tazobactam and we will add azithromycin today for atypical coverage.  We will hold off on adding MRSA coverage.  Still rather somnolent, but wakes up briefly.  Unable to sustain alertness.  SLP recommends continued n.p.o.  We will have to consider sources of temporary nutrition soon if he does not improve enough to resume eating.  He continues to have high blood pressures and is unable to take his usual antihypertensive medications.  Okay to use IV labetalol right now.  Recent studies have shown there is little benefit to treating asymptomatic increased blood pressure in the hospital, so we will not intervene too aggressively and hope we can resume his oral antihypertensives soon.  Hyponatremia has worsened again, will give additional D5W and continue to trend.  Not surprising he has dehydration as he is unable to take in any liquids.  Lenice Pressman, M.D., Ph.D. 03/22/2019, 1:57  PM

## 2019-03-22 NOTE — Progress Notes (Signed)
.  PHARMACY - PHYSICIAN COMMUNICATION CRITICAL VALUE ALERT - BLOOD CULTURE IDENTIFICATION (BCID)  [name] is an 78 y.o. male who presented to [facility] on 03/20/2019 with a chief complaint of pneumonia  Assessment:  Gram positive coccobaccili 2/4 bottles, No identification on BCID panel   Name of physician (or Provider) Contacted: Nita Sickle MD  Current antibiotics: Zosyn 3.375g IV q8h  Changes to prescribed antibiotics recommended:  Continue current therapy and monitor cultures.    No results found for this or any previous visit.  Harrietta Guardian, PharmD PGY1 Pharmacy Resident 03/22/2019    9:16 AM Please check AMION for all [place] numbers

## 2019-03-22 NOTE — Progress Notes (Signed)
Reported blood pressure response to Dr. Alfonse Spruce.  Asked about calling the family in today. She will call back.

## 2019-03-23 ENCOUNTER — Other Ambulatory Visit (HOSPITAL_COMMUNITY): Payer: PPO

## 2019-03-23 DIAGNOSIS — R400 Somnolence: Secondary | ICD-10-CM

## 2019-03-23 DIAGNOSIS — J690 Pneumonitis due to inhalation of food and vomit: Secondary | ICD-10-CM

## 2019-03-23 LAB — BASIC METABOLIC PANEL
Anion gap: 8 (ref 5–15)
BUN: 17 mg/dL (ref 8–23)
CO2: 28 mmol/L (ref 22–32)
Calcium: 8.9 mg/dL (ref 8.9–10.3)
Chloride: 108 mmol/L (ref 98–111)
Creatinine, Ser: 1.16 mg/dL (ref 0.61–1.24)
GFR calc Af Amer: >60 mL/min (ref >60)
GFR calc non Af Amer: >60 mL/min (ref >60)
Glucose, Bld: 134 mg/dL — ABNORMAL HIGH (ref 70–99)
Potassium: 3.3 mmol/L — ABNORMAL LOW (ref 3.5–5.1)
Sodium: 144 mmol/L (ref 135–145)

## 2019-03-23 LAB — CBC WITH DIFFERENTIAL/PLATELET
Abs Immature Granulocytes: 0.06 10*3/uL (ref 0.00–0.07)
Basophils Absolute: 0 10*3/uL (ref 0.0–0.1)
Basophils Relative: 0 %
Eosinophils Absolute: 0 10*3/uL (ref 0.0–0.5)
Eosinophils Relative: 0 %
HCT: 37.6 % — ABNORMAL LOW (ref 39.0–52.0)
Hemoglobin: 11.1 g/dL — ABNORMAL LOW (ref 13.0–17.0)
Immature Granulocytes: 1 %
Lymphocytes Relative: 15 %
Lymphs Abs: 1.8 10*3/uL (ref 0.7–4.0)
MCH: 24.8 pg — ABNORMAL LOW (ref 26.0–34.0)
MCHC: 29.5 g/dL — ABNORMAL LOW (ref 30.0–36.0)
MCV: 84.1 fL (ref 80.0–100.0)
Monocytes Absolute: 0.9 10*3/uL (ref 0.1–1.0)
Monocytes Relative: 8 %
Neutro Abs: 8.6 10*3/uL — ABNORMAL HIGH (ref 1.7–7.7)
Neutrophils Relative %: 76 %
Platelets: 270 10*3/uL (ref 150–400)
RBC: 4.47 MIL/uL (ref 4.22–5.81)
RDW: 16.5 % — ABNORMAL HIGH (ref 11.5–15.5)
WBC: 11.3 10*3/uL — ABNORMAL HIGH (ref 4.0–10.5)
nRBC: 0 % (ref 0.0–0.2)

## 2019-03-23 LAB — CULTURE, BLOOD (ROUTINE X 2): Special Requests: ADEQUATE

## 2019-03-23 LAB — GLUCOSE, CAPILLARY
Glucose-Capillary: 121 mg/dL — ABNORMAL HIGH (ref 70–99)
Glucose-Capillary: 101 mg/dL — ABNORMAL HIGH (ref 70–99)
Glucose-Capillary: 96 mg/dL (ref 70–99)
Glucose-Capillary: 90 mg/dL (ref 70–99)

## 2019-03-23 MED ORDER — HYDRALAZINE HCL 20 MG/ML IJ SOLN
10.0000 mg | Freq: Once | INTRAMUSCULAR | Status: AC
  Administered 2019-03-23: 07:00:00 10 mg via INTRAVENOUS
  Filled 2019-03-23: qty 1

## 2019-03-23 NOTE — Progress Notes (Signed)
Patient temperature is 102 and he received Tylenol  Suppository. Will continue to monitor patient.

## 2019-03-23 NOTE — Progress Notes (Signed)
  Speech Language Pathology Treatment: Dysphagia  Patient Details Name: [name] MRN: [id number] DOB: May 11, 1941 Today's Date: 03/23/2019 Time: 7824-2353 SLP Time Calculation (min) (ACUTE ONLY): 15 min  Assessment / Plan / Recommendation Clinical Impression  Patient seen to address dysphagia goals as patient is currently NPO. During today's visit, he required maximal frequency and intensity of verbal and tactile cues to initiate arousal/opening eyes, but was only able to maintain for a second or less. Patient did initiate sucking on toothette sponge soaked in water, but only one incomplete swallow was observed to be initiated. No coughing observed, but very limited thin liquids (water) PO's were presented. Patient continues to not be safe for PO"s secondary to his current state of lethargy with very poor ability to arouse and maintain adequate alertness.     HPI HPI: 78yo male with PMH of hyperaldosteronism, HTN, T2DM, neurogenic bladder s/p suprapubic catheter, h/o CVA with left sided hemiparesis, vascular dementia presenting from [facility] due to nonresolving pneumonia. CT head did not reveal any acute abnormality. MR Brain ordererd on 6/8 but not completed at the time of this update to HPI.      SLP Plan  Continue with current plan of care       Recommendations  Diet recommendations: NPO Medication Administration: Via alternative means                Oral Care Recommendations: Oral care QID Follow up Recommendations: Skilled Nursing facility SLP Visit Diagnosis: Dysphagia, unspecified (R13.10) Plan: Continue with current plan of care       GO                Dannial Monarch 03/23/2019, 10:51 AM   Sonia Baller, MA, CCC-SLP Speech Therapy [facility] Acute Rehab Pager: [phone]

## 2019-03-23 NOTE — Progress Notes (Addendum)
Initial Nutrition Assessment  RD working remotely.  DOCUMENTATION CODES:   Not applicable  INTERVENTION:   -RD will follow for diet advancement and supplement as appropriate -If pt remains to take PO diet, consider initiation of nutrition support. If short term small bore feeding tube is desired, Cortrak tube placement team only available on Mondays and Fridays (other option is to have IR place tube). Recommend:  Initiate Jevity 1.2 @ 25 ml/hr and increase by 10 ml every 4 hours to goal rate of 65 ml/hr.   30 ml Prostat daily.    Tube feeding regimen provides 1872 kcal (100% of needs), 101 grams of protein, and 1259 ml of H2O.   NUTRITION DIAGNOSIS:   Inadequate oral intake related to inability to eat as evidenced by NPO status.  GOAL:   Patient will meet greater than or equal to 90% of their needs  MONITOR:   Diet advancement, Labs, Weight trends, Skin, I & O's  REASON FOR ASSESSMENT:   Low Braden    ASSESSMENT:   [name] is a 78yo male with hyperaldosteronism, HTN, T2DM, neurogenic bladder s/p suprapubic catheter, h/o CVA with left sided hemiparesis,andvascular dementiawho is being treated for aspiration pneumonia.  Pt admitted with sepsis secondary to aspiration pneumonia.   6/6- failed Yale swallow screen, unable to complete BSE due to lethargy 6/7- repeat BSE- recommend NPO 6/8- repeat BSE- recommend continue NPO  Reviewed I/O's: +1.3 L x 24 hours and +2.6 L since admission  UOP: 720 ml x 24 hours  Due to pt's dementia and lethargy, unable to obtain further nutrition-related history at this time.   Per chart review, pt resides at [facility] and was on a low concentrated sweets, regular texture diet with thin liquids PTA. Suspect poor oral intake PTA, as pt has experienced a 15.5% wt loss over the past year. Although this is not significant for time frame, it is concerning given older age, likely suboptimal oral intake PTA, and multiple  co-morbidities. Unable to identify malnutrition at this time without completion of nutrition-focused physical exam.   Pt remains NPO and has not been alert enough to advance to a PO diet.   Lab Results  Component Value Date   HGBA1C 7.2 06/07/2018   PTA DM medications are 0-5 units insulin lispro TID with meals and 6 units insulin glargine daily. Per ADA's Standards of Medical Care for Diabetes, glycemic targets for elderly patients who are otherwise healthy (few medical impairments) and cognitively intact should be less stringent (Hgb A1c <7.5).   Labs reviewed: K: 3.3, CBGS: 121-205 (inpatient orders for glycemic control are 0-9 units insulin aspart TID with meals).   NUTRITION - FOCUSED PHYSICAL EXAM:    Most Recent Value  Orbital Region  Unable to assess  Upper Arm Region  Unable to assess  Thoracic and Lumbar Region  Unable to assess  Buccal Region  Unable to assess  Temple Region  Unable to assess  Clavicle Bone Region  Unable to assess  Clavicle and Acromion Bone Region  Unable to assess  Scapular Bone Region  Unable to assess  Dorsal Hand  Unable to assess  Patellar Region  Unable to assess  Anterior Thigh Region  Unable to assess  Posterior Calf Region  Unable to assess  Edema (RD Assessment)  Unable to assess  Hair  Unable to assess  Eyes  Unable to assess  Mouth  Unable to assess  Skin  Unable to assess  Nails  Unable to assess  Diet Order:   Diet Order            Diet NPO time specified Except for: Sips with Meds  Diet effective now              EDUCATION NEEDS:   Not appropriate for education at this time  Skin:  Skin Assessment: Skin Integrity Issues: Skin Integrity Issues:: Other (Comment) Other: non pressure wound to lt buttocks  Last BM:  Unknown  Height:   Ht Readings from Last 1 Encounters:  03/20/19 5\' 7"  (1.702 m)    Weight:   Wt Readings from Last 1 Encounters:  03/20/19 74.3 kg    Ideal Body Weight:  67.3 kg  BMI:  Body  mass index is 25.66 kg/m.  Estimated Nutritional Needs:   Kcal:  1850-2050  Protein:  95-110 grams  Fluid:  > 1.8 L    [name] A. Jimmye Norman, RD, LDN, [place] Registered Dietitian II Certified Diabetes Care and Education Specialist Pager: [phone] After hours Pager: [phone]

## 2019-03-23 NOTE — Progress Notes (Signed)
   Subjective: He does appear to be very somnolent this morning.  He opens his eyes very briefly to verbal stimuli.  He does not answer any of my questions.  Per nursing staff he has not been interacting with them since yesterday.  Objective:  Vital signs in last 24 hours: Vitals:   03/23/19 0653 03/23/19 0721 03/23/19 1143 03/23/19 1210  BP: (!) 210/79 (!) 194/75 (!) 155/64 137/64  Pulse: 69 74 71 63  Resp:   20   Temp:  (!) 101.3 F (38.5 C) 98.5 F (36.9 C) (!) 100.5 F (38.1 C)  TempSrc:  Oral Oral   SpO2:  100% 100% 100%  Weight:      Height:       General: Lying in bed in no acute distress Cardiovascular: Normal rate, regular rhythm Respiratory: Auscultated lungs anteriorly, diminished breath sounds bilaterally, crackles heard at the lung bases bilaterally.   Assessment/Plan:  Principal Problem:   Sepsis ([place]) Active Problems:   AKI (acute kidney injury) ([place])   Community acquired pneumonia   Hypertensive urgency   Aspiration pneumonia [facility])   [name] is a 78yo male with hyperaldosteronism, HTN, T2DM, neurogenic bladder s/p suprapubic catheter, h/o CVA with left sided hemiparesis,andvascular dementiawho is being treated for aspiration pneumonia.  Sepsis2/2Aspiration PNA:He is currently on Unasyn and azithromycin.  His leukocytosis continues to improve.  He does however continue to have fevers.  Chest x-ray however was much improved. - Continue Unasyn and azithromycin - Blood cultures no growth to date - Continue supplemental oxygen as needed. - Tylenol PRN pain and fever - Failed SLP eval due to being somnolent.  Will keep n.p.o. for now. - Continuous cardiac monitoring  Hypertensive urgency: He is unable to take his home amlodipine, carvedilol, and hydralazine as he is altered and cannot take meds p.o.  He does continue to have elevated blood pressures from 876O to 115 systolic. I am concerned that his hypertension may be contributing to his altered  mental status or that that he may have an intracranial process which is elevating his blood pressure.  Differential including PRES versus stroke.  Will get MRI head to evaluate for intracranial normalities. - Continue labetalol 10 mg every 2 hours. - Follow-up head MRI.  Acute kidney injury: Creatinine back to baseline with IV fluids.  We will continue to monitor.  Hypernatremia:  Resolved with D5W.  We will continue to monitor.  Type 2 diabetes: BS within acceptable limits. -Hold Lantus since he is n.p.o. - Continue NovoLog SSI 3 times daily with meals  Dementia/MDD: - Hold home donezepil 10 mg daily,memantine 20 mg daily, and sertraline 50 mg daily the setting of being n.p.o.  Dispo: Anticipated discharge pending clinical improvement.  [name], MD 03/23/2019, 12:38 PM Pager: [phone]

## 2019-03-24 ENCOUNTER — Inpatient Hospital Stay (HOSPITAL_COMMUNITY): Payer: Medicare Other

## 2019-03-24 DIAGNOSIS — Z515 Encounter for palliative care: Secondary | ICD-10-CM

## 2019-03-24 DIAGNOSIS — I629 Nontraumatic intracranial hemorrhage, unspecified: Secondary | ICD-10-CM

## 2019-03-24 DIAGNOSIS — Z7189 Other specified counseling: Secondary | ICD-10-CM

## 2019-03-24 DIAGNOSIS — G9340 Encephalopathy, unspecified: Secondary | ICD-10-CM

## 2019-03-24 DIAGNOSIS — R5383 Other fatigue: Secondary | ICD-10-CM

## 2019-03-24 DIAGNOSIS — I6202 Nontraumatic subacute subdural hemorrhage: Secondary | ICD-10-CM

## 2019-03-24 DIAGNOSIS — J189 Pneumonia, unspecified organism: Secondary | ICD-10-CM

## 2019-03-24 LAB — CBC WITH DIFFERENTIAL/PLATELET
Abs Immature Granulocytes: 0.07 10*3/uL (ref 0.00–0.07)
Basophils Absolute: 0 10*3/uL (ref 0.0–0.1)
Basophils Relative: 0 %
Eosinophils Absolute: 0 10*3/uL (ref 0.0–0.5)
Eosinophils Relative: 0 %
HCT: 39.2 % (ref 39.0–52.0)
Hemoglobin: 11.4 g/dL — ABNORMAL LOW (ref 13.0–17.0)
Immature Granulocytes: 1 %
Lymphocytes Relative: 11 %
Lymphs Abs: 1.5 10*3/uL (ref 0.7–4.0)
MCH: 24.9 pg — ABNORMAL LOW (ref 26.0–34.0)
MCHC: 29.1 g/dL — ABNORMAL LOW (ref 30.0–36.0)
MCV: 85.6 fL (ref 80.0–100.0)
Monocytes Absolute: 1 10*3/uL (ref 0.1–1.0)
Monocytes Relative: 7 %
Neutro Abs: 10.5 10*3/uL — ABNORMAL HIGH (ref 1.7–7.7)
Neutrophils Relative %: 81 %
Platelets: 262 10*3/uL (ref 150–400)
RBC: 4.58 MIL/uL (ref 4.22–5.81)
RDW: 16.6 % — ABNORMAL HIGH (ref 11.5–15.5)
WBC: 13 10*3/uL — ABNORMAL HIGH (ref 4.0–10.5)
nRBC: 0 % (ref 0.0–0.2)

## 2019-03-24 LAB — BASIC METABOLIC PANEL
Anion gap: 9 (ref 5–15)
BUN: 16 mg/dL (ref 8–23)
CO2: 30 mmol/L (ref 22–32)
Calcium: 9.2 mg/dL (ref 8.9–10.3)
Chloride: 107 mmol/L (ref 98–111)
Creatinine, Ser: 1.32 mg/dL — ABNORMAL HIGH (ref 0.61–1.24)
GFR calc Af Amer: >60 mL/min (ref >60)
GFR calc non Af Amer: 52 mL/min — ABNORMAL LOW (ref >60)
Glucose, Bld: 100 mg/dL — ABNORMAL HIGH (ref 70–99)
Potassium: 3.4 mmol/L — ABNORMAL LOW (ref 3.5–5.1)
Sodium: 146 mmol/L — ABNORMAL HIGH (ref 135–145)

## 2019-03-24 LAB — PROTIME-INR
INR: 1.2 (ref 0.8–1.2)
Prothrombin Time: 15.3 seconds — ABNORMAL HIGH (ref 11.4–15.2)

## 2019-03-24 LAB — GLUCOSE, CAPILLARY
Glucose-Capillary: 86 mg/dL (ref 70–99)
Glucose-Capillary: 91 mg/dL (ref 70–99)
Glucose-Capillary: 149 mg/dL — ABNORMAL HIGH (ref 70–99)
Glucose-Capillary: 121 mg/dL — ABNORMAL HIGH (ref 70–99)

## 2019-03-24 LAB — APTT: aPTT: 29 seconds (ref 24–36)

## 2019-03-24 LAB — MRSA PCR SCREENING: MRSA by PCR: NEGATIVE

## 2019-03-24 MED ORDER — CLEVIDIPINE BUTYRATE 0.5 MG/ML IV EMUL
0.0000 mg/h | INTRAVENOUS | Status: DC
  Administered 2019-03-24 – 2019-03-25 (×3): 12 mg/h via INTRAVENOUS
  Administered 2019-03-25: 8 mg/h via INTRAVENOUS
  Administered 2019-03-25: 9 mg/h via INTRAVENOUS
  Administered 2019-03-26: 06:00:00 13 mg/h via INTRAVENOUS
  Administered 2019-03-26: 01:00:00 12 mg/h via INTRAVENOUS
  Filled 2019-03-24 (×7): qty 50

## 2019-03-24 MED ORDER — CLEVIDIPINE BUTYRATE 0.5 MG/ML IV EMUL
0.0000 mg/h | INTRAVENOUS | Status: DC
  Administered 2019-03-24: 10 mg/h via INTRAVENOUS
  Administered 2019-03-24: 16:00:00 20 mg/h via INTRAVENOUS
  Administered 2019-03-24: 17:00:00 16 mg/h via INTRAVENOUS
  Filled 2019-03-24 (×3): qty 50

## 2019-03-24 MED ORDER — CHLORHEXIDINE GLUCONATE 0.12 % MT SOLN
15.0000 mL | Freq: Two times a day (BID) | OROMUCOSAL | Status: DC
  Administered 2019-03-24 – 2019-04-02 (×18): 15 mL via OROMUCOSAL
  Filled 2019-03-24 (×14): qty 15

## 2019-03-24 MED ORDER — SODIUM CHLORIDE 0.9% FLUSH
10.0000 mL | Freq: Two times a day (BID) | INTRAVENOUS | Status: DC
  Administered 2019-03-24 – 2019-03-31 (×15): 10 mL

## 2019-03-24 MED ORDER — POTASSIUM CHLORIDE 10 MEQ/100ML IV SOLN
10.0000 meq | INTRAVENOUS | Status: AC
  Administered 2019-03-24 (×3): 10 meq via INTRAVENOUS
  Filled 2019-03-24 (×3): qty 100

## 2019-03-24 MED ORDER — IOHEXOL 350 MG/ML SOLN
75.0000 mL | Freq: Once | INTRAVENOUS | Status: AC | PRN
  Administered 2019-03-24: 11:00:00 75 mL via INTRAVENOUS

## 2019-03-24 MED ORDER — VITAL HIGH PROTEIN PO LIQD: 1000.0000 mL | ORAL | Status: DC

## 2019-03-24 MED ORDER — PRO-STAT SUGAR FREE PO LIQD: 30.0000 mL | Freq: Two times a day (BID) | ORAL | Status: DC

## 2019-03-24 MED ORDER — ORAL CARE MOUTH RINSE
15.0000 mL | Freq: Two times a day (BID) | OROMUCOSAL | Status: DC
  Administered 2019-03-24 – 2019-04-02 (×19): 15 mL via OROMUCOSAL

## 2019-03-24 MED ORDER — CHLORHEXIDINE GLUCONATE CLOTH 2 % EX PADS
6.0000 | MEDICATED_PAD | Freq: Every day | CUTANEOUS | Status: DC
  Administered 2019-03-24 – 2019-03-27 (×4): 6 via TOPICAL

## 2019-03-24 MED ORDER — HYDRALAZINE HCL 20 MG/ML IJ SOLN
10.0000 mg | Freq: Once | INTRAMUSCULAR | Status: AC
  Administered 2019-03-24: 05:00:00 10 mg via INTRAVENOUS
  Filled 2019-03-24: qty 1

## 2019-03-24 MED ORDER — SODIUM CHLORIDE 0.9% FLUSH: 10.0000 mL | INTRAVENOUS | Status: DC | PRN

## 2019-03-24 MED ORDER — HYDRALAZINE HCL 20 MG/ML IJ SOLN: 10.0000 mg | Freq: Once | INTRAMUSCULAR | Status: DC

## 2019-03-24 NOTE — Care Management Important Message (Signed)
Important Message  Patient Details  Name: [name] MRN: [id number] Date of Birth: [birth year]   Medicare Important Message Given:  Yes    [name] 03/24/2019, 3:20 PM

## 2019-03-24 NOTE — Progress Notes (Signed)
Pt returned to floor from MRI, RN noted that midline securing device is not in place and IV dressing partially removed. Line seems to be in place, no bleeding. IV team paged.

## 2019-03-24 NOTE — Progress Notes (Signed)
BP 181/74, HR 62, Labetalol scheduled for 0630. MD on call notified.

## 2019-03-24 NOTE — Progress Notes (Addendum)
One time dose of hydralazine given, BP 186/66, HR 60-65. MD on call notified.  Pt asymptomatic, new orders to be placed by MD.

## 2019-03-24 NOTE — Progress Notes (Signed)
Pt transported  to 4N ICU, report given to RN, pt received his last dose of labetalol 10 mg by IV push at 1341.

## 2019-03-24 NOTE — Consult Note (Signed)
Consultation Note Date: 03/24/2019   Patient Name: [name]  DOB: 07-Aug-1941  MRN: [id number]  Age / Sex: 78 y.o., male  PCP: Patient, No Pcp Per Referring Physician: Aldine Contes, MD  Reason for Consultation: Establishing goals of care and Psychosocial/spiritual support  HPI/Patient Profile: 78 y.o. male  admitted on 03/20/2019 with concern for possible PNA.  Developed worsening mental status 6/8 with subsequent MRI showing acute / subacute subarachnoid & subdural hemorrhage  IMPRESSION:  MRI 1. Acute/subacute subarachnoid and subdural hemorrhage is described. Subdural blood is most evident in the left sylvian fissure and prepontine space. 2. Subdural blood is most evident posteriorly, right greater than left. 3. Layering blood products within the lateral ventricles bilaterally without hydrocephalus. 4. No significant midline shift. 5. Petechial parenchymal hemorrhage is present in the left frontal operculum and to a lesser extent posterior right frontal operculum. 6. Additional subarachnoid blood and possible parenchymal petechial hemorrhage is noted in the anteromedial frontal lobes bilaterally along the falx. 7. Multiple punctate foci of susceptibility suggesting underlying vasculitis. This could be the source of hemorrhage. Aneurysmal bleed is not excluded. 8. Right MCA aneurysm clip in place. 9. Susceptibility from the blood products without acute restricted diffusion, ischemic stroke.  Patient is long term resident of SNF. ES-dementia at baseline  Family face treatment option decisions, advanced directive decisions and anticipatory care needs.    Clinical Assessment and Goals of Care:    This NP Wadie Lessen reviewed medical records, received report from team, assessed the patient and then meet at the patient's bedside and spoke to wife by phone to discuss diagnosis,  prognosis, GOC, EOL wishes disposition and options.  Concept of  Palliative Care was discussed  A  discussion was had today regarding advanced directives.  Concepts specific to code status was had.  The difference between a aggressive medical intervention path  and a palliative comfort care path for this patient at this time was had.  Values and goals of care important to patient and family were attempted to be elicited.  I shared with wife the seriousness of the situation and that anything could happen at any time.   Patient is high risk for decompensation.     Questions and concerns addressed.   Family encouraged to call with questions or concerns.    PMT will continue to support holistically.    Patient does not have medical decision-making capacity.  His wife and daughter are his main support persons and decision makers.  SUMMARY OF RECOMMENDATIONS    Code Status/Advance Care Planning:  Full code   Encouraged family to consider DNR/DNI status knowing poor outcomes in similar patients.   Palliative Prophylaxis:   Aspiration, Bowel Regimen, Delirium Protocol, Frequent Pain Assessment and Oral Care  Additional Recommendations (Limitations, Scope, Preferences):  Full Scope Treatment   Plan is to meet tomorrow for further conversation hoping to clarify GOCs.  Family has not seen this man in over 2 months.  They cannot make ANY decision until they see him in person.  Visitation policy will allow for this visit.  I will meet them at 0930 tomorrow morning  Psycho-social/Spiritual:   Desire for further Chaplaincy support:no  Additional Recommendations: Grief/Bereavement Support  Prognosis:   Unable to determine  Discharge Planning: To Be Determined      Primary Diagnoses: Present on Admission: . Community acquired pneumonia . Sepsis ([place]) . AKI (acute kidney injury) ([place]) . Hypertensive urgency . Aspiration pneumonia ([place])   I have reviewed the medical record,  interviewed the patient and family, and examined the patient. The following aspects are pertinent.  Past Medical History:  Diagnosis Date  . Arthritis    "maybe"  . 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 CHF (congestive heart failure) ([place])   . Depression   . 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 Hyperlipemia   . Hypertension   . Sleep apnea    "my machine [place]; got to get me another one" (09/01/2014)  . Stroke [facility]) 2009   left thalamic infarct, residual LUE weakness  . TIA (transient ischemic attack)    multiple in past, most recently in 2011  . Type II diabetes mellitus ([place])    Social History   Socioeconomic History  . Marital status: Married    Spouse name: [name]  . Number of children: 3  . Years of education: HS  . Highest education level: Not on file  Occupational History  . Occupation: disabled  Social Needs  . Financial resource strain: Not hard at all  . Food insecurity:    Worry: Never true    Inability: Never true  . Transportation needs:    Medical: No    Non-medical: No  Tobacco Use  . Smoking status: Former Smoker    Packs/day: 2.00    Years: 20.00    Pack years: 40.00    Types: Cigarettes    Last attempt to quit: 10/16/1983    Years since quitting: 35.4  . Smokeless tobacco: Never Used  Substance and Sexual Activity  . Alcohol use: No    Alcohol/week: 0.0 standard drinks  . Drug use: No  . Sexual activity: Never  Lifestyle  . Physical activity:    Days per week: 0 days    Minutes per session: 0 min  . Stress: Not at all  Relationships  . Social connections:    Talks on phone: Three times a week    Gets together: Three times a week    Attends religious service: Never    Active member of club or organization: No    Attends meetings of clubs or organizations: Never     Relationship status: Married  Other Topics Concern  . Not on file  Social History Narrative  . Not on file   Family History  Problem Relation Age of Onset  . Stroke Mother   . Pneumonia Father   . Diabetes Sister   . Diabetes Brother   . Diabetes Paternal Aunt   . Colon cancer Neg Hx    Scheduled Meds: . donepezil  10 mg Oral QHS  . insulin aspart  0-9 Units Subcutaneous TID WC  . labetalol  10 mg Intravenous Q2H  . mouth rinse  15 mL Mouth Rinse BID  . memantine  28 mg Oral Daily  . mirabegron ER  50 mg Oral Daily  .  polyethylene glycol  17 g Oral Daily  . polyvinyl alcohol  1 drop Both Eyes BID  . prednisoLONE acetate  1 drop Left Eye TID AC & HS  . sertraline  50 mg Oral Daily  . sodium chloride flush  10-40 mL Intracatheter Q12H   Continuous Infusions: . sodium chloride 250 mL (03/21/19 0646)  . clevidipine     PRN Meds:.sodium chloride, acetaminophen **OR** acetaminophen, senna, sodium chloride flush Medications Prior to Admission:  Prior to Admission medications   Medication Sig Start Date End Date Taking? Authorizing Provider  amLODipine (NORVASC) 10 MG tablet Take 10 mg by mouth every morning.    Yes [provider]  carvedilol (COREG) 12.5 MG tablet Take 1 tablet (12.5 mg total) by mouth 2 (two) times daily. 01/06/15  Yes Aldine Contes, MD  cefTRIAXone (ROCEPHIN) 1 g injection Inject 1 g into the muscle daily. Started on 03-18-19 Duration 6 days   Yes [provider]  Cholecalciferol (VITAMIN D) 125 MCG (5000 UT) CAPS Take 5,000 Units by mouth daily.    Yes [provider]  clopidogrel (PLAVIX) 75 MG tablet Take 75 mg by mouth at bedtime.   Yes [provider]  diclofenac sodium (VOLTAREN) 1 % GEL Apply 4 g topically 4 (four) times daily. Apply to Bilateral Knees   Yes [provider]  DOCUSATE SODIUM PO Take 1 capsule by mouth every 12 (twelve) hours as needed.   Yes [provider]  donepezil (ARICEPT) 10 MG  tablet Take 1 tablet (10 mg total) by mouth at bedtime. 06/01/15  Yes Dennie Bible, NP  hydrALAZINE (APRESOLINE) 50 MG tablet Take 75 mg by mouth 4 (four) times daily.    Yes [provider]  insulin glargine (LANTUS) 100 UNIT/ML injection Inject 6 Units into the skin daily.    Yes [provider]  insulin lispro (HUMALOG) 100 UNIT/ML injection Inject 0-5 Units into the skin 3 (three) times daily before meals. Sliding scale 0-70= 0 units, 71-150= 0 units, 151-300= 3 units, 301-399=5 units, 400+ above call MD   Yes [provider]  ipratropium-albuterol (DUONEB) 0.5-2.5 (3) MG/3ML SOLN Take 1 mL by nebulization every 6 (six) hours as needed (shortness of breath).   Yes [provider]  LACTOBACILLUS PO Take 1 capsule by mouth 2 (two) times daily. X 10 days, started on 03-17-19   Yes [provider]  lisinopril (ZESTRIL) 40 MG tablet Take 40 mg by mouth daily.    Yes [provider]  memantine (NAMENDA XR) 28 MG CP24 24 hr capsule Take 1 capsule (28 mg total) by mouth daily. 06/01/15  Yes Dennie Bible, NP  metFORMIN (GLUCOPHAGE) 500 MG tablet Take 500 mg by mouth daily.   Yes [provider]  mirabegron ER (MYRBETRIQ) 50 MG TB24 tablet Take 50 mg by mouth daily.   Yes [provider]  nitroGLYCERIN (NITROSTAT) 0.4 MG SL tablet Place 0.4 mg under the tongue every 5 (five) minutes as needed for chest pain.   Yes [provider]  Nutritional Supplements (NUTRITIONAL SUPPLEMENT PO) Low Concentrated Sweets Diet - regular texture, Regular / thin consistency   Yes [provider]  Polyethyl Glycol-Propyl Glycol (SYSTANE) 0.4-0.3 % SOLN Apply 1 drop to eye 2 (two) times daily. Both eyes   Yes [provider]  polyethylene glycol (MIRALAX / GLYCOLAX) packet Take 17 g by mouth daily.   Yes [provider]  prednisoLONE acetate (PRED FORTE) 1 % ophthalmic suspension Place  1 drop into the left  eye 4 (four) times daily.   Yes [provider]  senna (SENOKOT) 8.6 MG tablet Take 1 tablet by mouth every 12 (twelve) hours as needed for constipation.   Yes [provider]  sertraline (ZOLOFT) 50 MG tablet Take 50 mg by mouth daily.  06/27/18  Yes [provider]  simvastatin (ZOCOR) 20 MG tablet Take 1 tablet (20 mg total) by mouth at bedtime. 05/29/16  Yes Aldine Contes, MD  tamsulosin (FLOMAX) 0.4 MG CAPS capsule Take 0.4 mg by mouth at bedtime.   Yes [provider]  tiZANidine (ZANAFLEX) 2 MG tablet Take 2 mg by mouth 2 (two) times daily. 02/06/18  Yes [provider]  torsemide (DEMADEX) 10 MG tablet Take 10 mg by mouth daily.   Yes [provider]  traMADol (ULTRAM) 50 MG tablet Take 1 tablet (50 mg total) by mouth every 6 (six) hours as needed for moderate pain or severe pain. 02/12/18  Yes Gildardo Cranker, DO   Allergies  Allergen Reactions  . Pioglitazone Other (See Comments)    Edema   . Rosiglitazone Maleate Other (See Comments)    edema   Review of Systems  Physical Exam Constitutional:      Appearance: He is normal weight.  Cardiovascular:     Rate and Rhythm: Normal rate and regular rhythm.  Musculoskeletal:     Comments: -generalized weakness and muscle atrophy  Skin:    General: Skin is warm and dry.  Neurological:     Mental Status: He is lethargic.     Motor: Weakness present.     Vital Signs: BP (!) 202/95   Pulse 72   Temp 99.1 F (37.3 C) (Axillary)   Resp 18   Ht 5\' 7"  (1.702 m)   Wt 75.9 kg   SpO2 95%   BMI 26.21 kg/m  Pain Scale: CPOT POSS *See Group Information*: 2-Acceptable,Slightly drowsy, easily aroused Pain Score: 0-No pain   SpO2: SpO2: 95 % O2 Device:SpO2: 95 % O2 Flow Rate: .O2 Flow Rate (L/min): 2 L/min  IO: Intake/output summary: No intake or output data in the 24 hours ending 03/24/19 1429  LBM: Last BM Date: (Unknown.) Baseline Weight: Weight: 74.3 kg Most recent  weight: Weight: 75.9 kg     Palliative Assessment/Data:   Discussed with Dr Alfonse Spruce and bedside RN  Time In: 1400 Time Out: 1510 Time Total: 70 minutes Greater than 50%  of this time was spent counseling and coordinating care related to the above assessment and plan.  Signed by: Wadie Lessen, NP   Please contact Palliative Medicine Team phone at [phone] for questions and concerns.  For individual provider: See Shea Evans

## 2019-03-24 NOTE — Consult Note (Signed)
Reason for Consult: Subarachnoid and subdural hemorrhage Referring Physician: Dr. Earle Gell is an 78 y.o. male.  HPI: Patient is a 78 year old male who has a history of dementia and stroke in the past having had an aneurysm clip some years ago.  He was noted to have developedPneumonia several days ago in his nursing home.  That did not respond to conservative management and he was hospitalized as it was noted he became progressively obtunded he has had high fevers.  An MRI of the brain was performed and this demonstrates that there is evidence of subarachnoid hemorrhage in addition to chronic and subacute subdural hemorrhage posteriorly there is also evidence of old stroke and some petechial parenchymal hemorrhages are noted.  No history of trauma is given.  I have reviewed the MRI of the patient's brain and do not identify a surgical lesion at this time.  Past Medical History:  Diagnosis Date  . Arthritis    "maybe"  . 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 CHF (congestive heart failure) ([place])   . Depression   . 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 Hyperlipemia   . Hypertension   . Sleep apnea    "my machine [place]; got to get me another one" (09/01/2014)  . Stroke [facility]) 2009   left thalamic infarct, residual LUE weakness  . TIA (transient ischemic attack)    multiple in past, most recently in 2011  . Type II diabetes mellitus ([place])     Past Surgical History:  Procedure Laterality Date  . CARDIAC CATHETERIZATION  07/2010  . CRANIOTOMY  05/2008   for clipping of an incidental asymptomatic unruptured/notes 02/13/2011  . KNEE ARTHROSCOPY Left   . 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 SHOULDER ARTHROSCOPY W/ ROTATOR CUFF REPAIR Right     Family History  Problem Relation Age of Onset  . Stroke  Mother   . Pneumonia Father   . Diabetes Sister   . Diabetes Brother   . Diabetes Paternal Aunt   . Colon cancer Neg Hx     Social History:  reports that he quit smoking about 35 years ago. His smoking use included cigarettes. He has a 40.00 pack-year smoking history. He has never used smokeless tobacco. He reports that he does not drink alcohol or use drugs.  Allergies:  Allergies  Allergen Reactions  . Pioglitazone Other (See Comments)    Edema   . Rosiglitazone Maleate Other (See Comments)    edema    Medications: I have reviewed the patient's current medications.  Results for orders placed or performed during the hospital encounter of 03/20/19 (from the past 48 hour(s))  Glucose, capillary     Status: Abnormal   Collection Time: 03/22/19  4:50 PM  Result Value Ref Range   Glucose-Capillary 205 (H) 70 - 99 mg/dL  Basic metabolic panel     Status: Abnormal   Collection Time: 03/22/19  6:59 PM  Result Value Ref Range   Sodium 143 135 - 145 mmol/L   Potassium 3.1 (L) 3.5 - 5.1 mmol/L   Chloride 107 98 - 111 mmol/L   CO2 28 22 - 32 mmol/L   Glucose, Bld 197 (H) 70 - 99 mg/dL   BUN 21 8 - 23 mg/dL   Creatinine, Ser 1.28 (H)  0.61 - 1.24 mg/dL   Calcium 8.9 8.9 - 10.3 mg/dL   GFR calc non Af Amer 54 (L) >60 mL/min   GFR calc Af Amer >60 >60 mL/min   Anion gap 8 5 - 15    Comment: Performed at [facility] [address], [place], [place] [address]  Basic metabolic panel     Status: Abnormal   Collection Time: 03/23/19  8:03 AM  Result Value Ref Range   Sodium 144 135 - 145 mmol/L   Potassium 3.3 (L) 3.5 - 5.1 mmol/L   Chloride 108 98 - 111 mmol/L   CO2 28 22 - 32 mmol/L   Glucose, Bld 134 (H) 70 - 99 mg/dL   BUN 17 8 - 23 mg/dL   Creatinine, Ser 1.16 0.61 - 1.24 mg/dL   Calcium 8.9 8.9 - 10.3 mg/dL   GFR calc non Af Amer >60 >60 mL/min   GFR calc Af Amer >60 >60 mL/min   Anion gap 8 5 - 15    Comment: Performed at [facility], [place] [address]  [place],  [address]  CBC with Differential/Platelet     Status: Abnormal   Collection Time: 03/23/19  8:03 AM  Result Value Ref Range   WBC 11.3 (H) 4.0 - 10.5 K/uL   RBC 4.47 4.22 - 5.81 MIL/uL   Hemoglobin 11.1 (L) 13.0 - 17.0 g/dL   HCT 37.6 (L) 39.0 - 52.0 %   MCV 84.1 80.0 - 100.0 fL   MCH 24.8 (L) 26.0 - 34.0 pg   MCHC 29.5 (L) 30.0 - 36.0 g/dL   RDW 16.5 (H) 11.5 - 15.5 %   Platelets 270 150 - 400 K/uL   nRBC 0.0 0.0 - 0.2 %   Neutrophils Relative % 76 %   Neutro Abs 8.6 (H) 1.7 - 7.7 K/uL   Lymphocytes Relative 15 %   Lymphs Abs 1.8 0.7 - 4.0 K/uL   Monocytes Relative 8 %   Monocytes Absolute 0.9 0.1 - 1.0 K/uL   Eosinophils Relative 0 %   Eosinophils Absolute 0.0 0.0 - 0.5 K/uL   Basophils Relative 0 %   Basophils Absolute 0.0 0.0 - 0.1 K/uL   Immature Granulocytes 1 %   Abs Immature Granulocytes 0.06 0.00 - 0.07 K/uL    Comment: Performed at [facility], [place] [address] [place], [place] [address]  Glucose, capillary     Status: Abnormal   Collection Time: 03/23/19  8:40 AM  Result Value Ref Range   Glucose-Capillary 121 (H) 70 - 99 mg/dL  Glucose, capillary     Status: Abnormal   Collection Time: 03/23/19 12:09 PM  Result Value Ref Range   Glucose-Capillary 101 (H) 70 - 99 mg/dL  Glucose, capillary     Status: None   Collection Time: 03/23/19  4:45 PM  Result Value Ref Range   Glucose-Capillary 96 70 - 99 mg/dL  Glucose, capillary     Status: None   Collection Time: 03/23/19  8:42 PM  Result Value Ref Range   Glucose-Capillary 90 70 - 99 mg/dL   Comment 1 Document in Chart   Basic metabolic panel     Status: Abnormal   Collection Time: 03/24/19  7:23 AM  Result Value Ref Range   Sodium 146 (H) 135 - 145 mmol/L   Potassium 3.4 (L) 3.5 - 5.1 mmol/L   Chloride 107 98 - 111 mmol/L   CO2 30 22 - 32 mmol/L  Glucose, Bld 100 (H) 70 - 99 mg/dL   BUN 16 8 - 23 mg/dL   Creatinine, Ser 1.32 (H) 0.61 - 1.24 mg/dL   Calcium 9.2 8.9 - 10.3 mg/dL   GFR calc  non Af Amer 52 (L) >60 mL/min   GFR calc Af Amer >60 >60 mL/min   Anion gap 9 5 - 15    Comment: Performed at [facility] [address] [place], [place] [address]  CBC with Differential     Status: Abnormal   Collection Time: 03/24/19  7:23 AM  Result Value Ref Range   WBC 13.0 (H) 4.0 - 10.5 K/uL   RBC 4.58 4.22 - 5.81 MIL/uL   Hemoglobin 11.4 (L) 13.0 - 17.0 g/dL   HCT 39.2 39.0 - 52.0 %   MCV 85.6 80.0 - 100.0 fL   MCH 24.9 (L) 26.0 - 34.0 pg   MCHC 29.1 (L) 30.0 - 36.0 g/dL   RDW 16.6 (H) 11.5 - 15.5 %   Platelets 262 150 - 400 K/uL   nRBC 0.0 0.0 - 0.2 %   Neutrophils Relative % 81 %   Neutro Abs 10.5 (H) 1.7 - 7.7 K/uL   Lymphocytes Relative 11 %   Lymphs Abs 1.5 0.7 - 4.0 K/uL   Monocytes Relative 7 %   Monocytes Absolute 1.0 0.1 - 1.0 K/uL   Eosinophils Relative 0 %   Eosinophils Absolute 0.0 0.0 - 0.5 K/uL   Basophils Relative 0 %   Basophils Absolute 0.0 0.0 - 0.1 K/uL   Immature Granulocytes 1 %   Abs Immature Granulocytes 0.07 0.00 - 0.07 K/uL    Comment: Performed at [facility], [place] [address], [place], [place] [address]  Glucose, capillary     Status: None   Collection Time: 03/24/19  7:46 AM  Result Value Ref Range   Glucose-Capillary 86 70 - 99 mg/dL  Glucose, capillary     Status: None   Collection Time: 03/24/19 12:10 PM  Result Value Ref Range   Glucose-Capillary 91 70 - 99 mg/dL    Ct Code Stroke Cta Head W/wo Contrast  Result Date: 03/24/2019 CLINICAL DATA:  Intracranial hemorrhage. History of aneurysm clipping EXAM: CT ANGIOGRAPHY HEAD AND NECK TECHNIQUE: Multidetector CT imaging of the head and neck was performed using the standard protocol during bolus administration of intravenous contrast. Multiplanar CT image reconstructions and MIPs were obtained to evaluate the vascular anatomy. Carotid stenosis measurements (when applicable) are obtained utilizing NASCET criteria, using the distal internal carotid diameter as the denominator.  CONTRAST:  52mL OMNIPAQUE IOHEXOL 350 MG/ML SOLN COMPARISON:  MRI head 03/24/2019, CT head 05/23/2016 FINDINGS: CT HEAD FINDINGS Brain: Acute/subacute hemorrhage is present in the subarachnoid space bilaterally. Layering blood products in the occipital horns bilaterally. No significant hemorrhage in the basilar cisterns. Small subdural hemorrhages posteriorly are better seen on the MRI today. Moderate atrophy. Ventricular enlargement is present which has progressed since 2017 and may represent hydrocephalus or progression of atrophy. Chronic ischemic changes throughout the white matter and basal ganglia. No acute ischemic infarct. No mass lesion. Vascular: Clipping of right MCA aneurysm. Negative for hyperdense vessel Skull: Right frontal craniotomy for aneurysm clipping Sinuses: Negative Orbits: Bilateral cataract surgery. Review of the MIP images confirms the above findings CTA NECK FINDINGS Aortic arch: Mild atherosclerotic disease aortic arch without aneurysm or dissection. Proximal great vessels widely patent Right carotid system: Mild atherosclerotic disease right carotid bifurcation without stenosis Left carotid system: Atherosclerotic disease left  carotid bifurcation. 20% diameter stenosis due to calcified and noncalcified plaque in the proximal left internal carotid artery. Vertebral arteries: Both vertebral arteries patent to the basilar without significant stenosis. Skeleton: Cervical spondylosis without acute skeletal abnormality. Other neck: Negative Upper chest: Mild airspace disease in the upper lobes bilaterally. Probable atelectasis. Review of the MIP images confirms the above findings CTA HEAD FINDINGS Anterior circulation: Atherosclerotic calcification in the cavernous carotid bilaterally. Moderate stenosis right supraclinoid internal carotid artery and mild stenosis left cavernous carotid. Anterior and middle cerebral arteries patent bilaterally without significant stenosis Aneurysm clip right  MCA bifurcation without recurrent aneurysm. Posterior circulation: Both vertebral arteries patent to the basilar without stenosis. Right PICA patent. Left PICA not visualized. Left AICA patent. Superior cerebellar and posterior cerebral arteries patent bilaterally. Venous sinuses: Normal venous enhancement Anatomic variants: None Delayed phase: Not perform Review of the MIP images confirms the above findings IMPRESSION: 1. Intraventricular hemorrhage bilaterally with ventricular enlargement compared with 2017. This is likely due to hydrocephalus given the acute hemorrhage. 2. Subarachnoid hemorrhage in the sylvian fissures bilaterally. No hemorrhage in the basilar cisterns. Hemorrhage pattern is not typical for aneurysm rupture and may be due to trauma or coagulopathy. 3. 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 4. Atrophy with chronic microvascular ischemic change. Electronically Signed   By: Franchot Gallo M.D.   On: 03/24/2019 11:43   Ct Code Stroke Cta Neck W/wo Contrast  Result Date: 03/24/2019 CLINICAL DATA:  Intracranial hemorrhage. History of aneurysm clipping EXAM: CT ANGIOGRAPHY HEAD AND NECK TECHNIQUE: Multidetector CT imaging of the head and neck was performed using the standard protocol during bolus administration of intravenous contrast. Multiplanar CT image reconstructions and MIPs were obtained to evaluate the vascular anatomy. Carotid stenosis measurements (when applicable) are obtained utilizing NASCET criteria, using the distal internal carotid diameter as the denominator. CONTRAST:  63mL OMNIPAQUE IOHEXOL 350 MG/ML SOLN COMPARISON:  MRI head 03/24/2019, CT head 05/23/2016 FINDINGS: CT HEAD FINDINGS Brain: Acute/subacute hemorrhage is present in the subarachnoid space bilaterally. Layering blood products in the occipital horns bilaterally. No significant hemorrhage in the basilar cisterns. Small subdural hemorrhages posteriorly are better seen on the MRI today.  Moderate atrophy. Ventricular enlargement is present which has progressed since 2017 and may represent hydrocephalus or progression of atrophy. Chronic ischemic changes throughout the white matter and basal ganglia. No acute ischemic infarct. No mass lesion. Vascular: Clipping of right MCA aneurysm. Negative for hyperdense vessel Skull: Right frontal craniotomy for aneurysm clipping Sinuses: Negative Orbits: Bilateral cataract surgery. Review of the MIP images confirms the above findings CTA NECK FINDINGS Aortic arch: Mild atherosclerotic disease aortic arch without aneurysm or dissection. Proximal great vessels widely patent Right carotid system: Mild atherosclerotic disease right carotid bifurcation without stenosis Left carotid system: Atherosclerotic disease left carotid bifurcation. 20% diameter stenosis due to calcified and noncalcified plaque in the proximal left internal carotid artery. Vertebral arteries: Both vertebral arteries patent to the basilar without significant stenosis. Skeleton: Cervical spondylosis without acute skeletal abnormality. Other neck: Negative Upper chest: Mild airspace disease in the upper lobes bilaterally. Probable atelectasis. Review of the MIP images confirms the above findings CTA HEAD FINDINGS Anterior circulation: Atherosclerotic calcification in the cavernous carotid bilaterally. Moderate stenosis right supraclinoid internal carotid artery and mild stenosis left cavernous carotid. Anterior and middle cerebral arteries patent bilaterally without significant stenosis Aneurysm clip right MCA bifurcation without recurrent aneurysm. Posterior circulation: Both vertebral arteries patent to the basilar without stenosis. Right PICA patent. Left PICA  not visualized. Left AICA patent. Superior cerebellar and posterior cerebral arteries patent bilaterally. Venous sinuses: Normal venous enhancement Anatomic variants: None Delayed phase: Not perform Review of the MIP images confirms the  above findings IMPRESSION: 1. Intraventricular hemorrhage bilaterally with ventricular enlargement compared with 2017. This is likely due to hydrocephalus given the acute hemorrhage. 2. Subarachnoid hemorrhage in the sylvian fissures bilaterally. No hemorrhage in the basilar cisterns. Hemorrhage pattern is not typical for aneurysm rupture and may be due to trauma or coagulopathy. 3. 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 4. Atrophy with chronic microvascular ischemic change. Electronically Signed   By: Franchot Gallo M.D.   On: 03/24/2019 11:43   Mr Brain Wo Contrast  Result Date: 03/24/2019 CLINICAL DATA:  Dementia, vascular etiology suspected. Shortness of breath at nursing home. Fever. Altered mental state. EXAM: MRI HEAD WITHOUT CONTRAST TECHNIQUE: Multiplanar, multiecho pulse sequences of the brain and surrounding structures were obtained without intravenous contrast. COMPARISON:  CT head without contrast 05/23/2016 FINDINGS: Brain: T1 hyperintense subdural fluid is evident about the right parietal and occipital lobe. There is extension along the interhemispheric falx. There is some T1 hyperintense fluid posterior to the left occipital pole as well. This creates artifact on the diffusion-weighted images. Additional subarachnoid and petechial parenchymal hemorrhage is present in the left sylvian fissure and left frontal operculum. A smaller area of focal susceptibility is present in the posterior right frontal operculum. Other areas of cortical/subarachnoid susceptibility are noted along the anterior cerebral falx bilaterally. Blood products layer within the lateral ventricles bilaterally. There is no hydrocephalus. Multiple punctate subcortical foci of susceptibility are evident bilaterally. Right MCA bifurcation aneurysm clip creates significant susceptibility artifact. Subarachnoid blood is present anterior to the brainstem, right greater than left. Remote lacunar infarcts  are present in the basal ganglia bilaterally, right greater than left. Chronic ischemic changes are present in the thalami. White matter changes extend into the brainstem. Vascular: Flow is present in the major intracranial arteries. Skull and upper cervical spine: The craniocervical junction is normal. Degenerative changes are noted in the cervical spine at the C3-4 level. Sinuses/Orbits: Mild mucosal thickening is present in the inferior maxillary sinuses and scattered in the ethmoid air cells bilaterally. No fluid levels are present. Bilateral lens replacements are noted. Globes and orbits are otherwise unremarkable. IMPRESSION: 1. Acute/subacute subarachnoid and subdural hemorrhage is described. Subdural blood is most evident in the left sylvian fissure and prepontine space. 2. Subdural blood is most evident posteriorly, right greater than left. 3. Layering blood products within the lateral ventricles bilaterally without hydrocephalus. 4. No significant midline shift. 5. Petechial parenchymal hemorrhage is present in the left frontal operculum and to a lesser extent posterior right frontal operculum. 6. Additional subarachnoid blood and possible parenchymal petechial hemorrhage is noted in the anteromedial frontal lobes bilaterally along the falx. 7. Multiple punctate foci of susceptibility suggesting underlying vasculitis. This could be the source of hemorrhage. Aneurysmal bleed is not excluded. 8. Right MCA aneurysm clip in place. 9. Susceptibility from the blood products without acute restricted diffusion, ischemic stroke. Critical Value/emergent results were called by telephone at the time of interpretation on 03/24/2019 at 7:02 am to Dr. Alfonse Spruce, who verbally acknowledged these results. Electronically Signed   By: San Morelle M.D.   On: 03/24/2019 07:02    Review of Systems  Unable to perform ROS: Dementia   Blood pressure (!) 202/95, pulse 72, temperature 99.1 F (37.3 C), temperature source  Axillary, resp. rate 18, height 5'  7" (1.702 m), weight 75.9 kg, SpO2 95 %. Physical Exam  Constitutional: He appears well-developed and well-nourished.  HENT:  Head: Normocephalic and atraumatic.  Eyes: Pupils are equal, round, and reactive to light. Conjunctivae are normal.  GI: Soft. Bowel sounds are normal.  Neurological:  Mental status reveals that he will open his right eye and focus on you.  He will follow about the room to a modest degree.  He is nonverbal at the current time but appears to be reasonably alert.  I cannot elicit any following of commands.  He appears plegic in the left side though he does move the right side spontaneously in the upper and lower extremity.  Skin: Skin is warm and dry.    Assessment/Plan: Subarachnoid and subdural hemorrhage related to sepsis.  Plan: I would advise medical management of this process with controlled blood pressure and stopping antiplatelet agents.  No surgical lesion is identified at this time.  Blanchie Dessert Satine Hausner 03/24/2019, 2:35 PM

## 2019-03-24 NOTE — Consult Note (Signed)
NAME:  [name], MRN:  [id number], DOB:  July 03, 1941, LOS: 4 ADMISSION DATE:  03/20/2019, CONSULTATION DATE:  03/24/19 REFERRING MD:  Dr. [name], CHIEF COMPLAINT:  [facility] / [place]    Brief History   78 y/o M admitted 6/5 with concern for possible PNA.  Developed worsening mental status 6/8 with subsequent MRI showing acute / subacute subarachnoid & subdural hemorrhage.   History of present illness   78 y/o M who was admitted on 6/5 from a SNF with hypertension, fever, tachypnea, hypoxemia and concern for RLL PNA based on CXR. He was sent to [facility] for evaluation 6/5 and admitted with concern for sepsis secondary to aspiration PNA.  Initial work up notable for hypernatremia, AKI, leukocytosis and mild anemia. The patient was admitted for further evaluation per IMTS.  He was treated with IV antibiotics for HCAP.  On the am of 6/8, he was noted to be altered initially thought related to sepsis.  BP was also significantly elevated and and MRI was ordered.  MRI showed an acute / subacute subarachnoid & subdural hemorrhage.  The patient was evaluated by Neurology and transferred to ICU.    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 Subdural blood most evident in the left sylvian fissure & prepontine space.  Subdural blood most evident posteriorly R>L.  Layering blood products within the lateral ventricles bilaterally w/o hydrocephalus, additional subarachnoid blood and possible parenchymal petechial hemorrhage int he anteromedial frontal lobes  bilaterally, R MCA aneurysm clip in place CTA Head / Neck 6/9 >> intraventricular hemorrhage bilaterally with ventricular enlargement, likely due to acute hemorrhage, SAH in the sylvian fissures bilaterally, pattern not typical of aneurysm fupture, surgical clipping of R MCA aneurysm, atrophy with chronic microvascular ischemic changes   Micro Data:  BCx2 6/5 >> 1/2 diptheroids  BCID 6/5 >> negative   Antimicrobials:  Unasyn 6/5 >> 6/6 Azithro 6/7 >> 6/9  Zosyn 6/5 >> 6/9   Interim history/subjective:  As above   Objective   Blood pressure (!) 177/61, pulse 68, temperature 99.1 F (37.3 C), temperature source Axillary, resp. rate 16, height 5\' 7"  (1.702 m), weight 75.9 kg, SpO2 98 %.        Intake/Output Summary (Last 24 hours) at 03/24/2019 1345 Last data filed at 03/23/2019 1426 Gross per 24 hour  Intake -  Output 300 ml  Net -300 ml   Filed Weights   03/20/19 2240 03/24/19 0457  Weight: 74.3 kg 75.9 kg    Examination: General: chronically ill appearing male in NAD HEENT: MM pink/moist, pupils 2-3 mm reactive Neuro: Awakens to voice, follows simple commands on right, flicker on left with painful stimuli  CV: s1s2 rrr, SR with frequent PVC's PULM: even/non-labored, lungs bilaterally clear  GI: soft, non-tender, bsx4 hyperactive, suprapubic catheter in place  Extremities: warm/dry, no edema  Skin: no rashes or lesions  Resolved Hospital Problem list      Assessment & Plan:   Subarachnoid hemorrhage with intraventricular extension Subdural hemorrhage  Hypertensive Crisis - suspected traumatic etiology, ? Fall PTA - SBP goal 140-161mmHg - Clevidipine infusion for BP goal - Holding antiplatelet agents - Plan per neurology / NSGY  HCAP vs Aspiration (SNF resident) - DC ABX after 5 day course zosyn and azithro - Will need swallowing evaluation once mental status improves. SLP following.  - Follow cultures - NPO - Place Cortrak for nutrition   DM2 - CBG  monitoring and SSI  Dementia  CVA history with residual Left hemiparesis. Unable to perform ADLs or even move his wheelchair.  - Supportive care - Palliative care has been consulted by primary team.  - continue aricept, namenda, zoloft for now  HTN HLD CHF  - Cleviprex gtt as above  Suprapubic Catheter  -catheter care per protocol  -continue myrbetriq   OSA  - not on CPAP  - consider CPAP once mental status improves   Best practice:  Diet: NPO Pain/Anxiety/Delirium protocol (if indicated): NA VAP protocol (if indicated): NA DVT prophylaxis: SCD GI prophylaxis: PPI Glucose control: SSI Mobility: BR Code Status: FULL Family Communication: Wife ([name]) updated on patients status in detail 6/9. Introduced concept of CPR / short term ventilation.  Recommended no CPR in the event of cardiac arrest due to underlying comorbid conditions.  Consider short term ventilation if Neurology feels appropriate and patient were to need. Family would like to discuss and think about his plan.  Difficult for them to make decisions as they have not seen him since March due to [place].   Disposition: ICU for antihypertensive infusion   Labs   CBC: Recent Labs  Lab 03/20/19 0827 03/21/19 0600 03/22/19 0659 03/23/19 0803 03/24/19 0723  WBC 14.8* 14.6* 13.4* 11.3* 13.0*  NEUTROABS 12.4*  --  10.9* 8.6* 10.5*  HGB 12.4* 11.6* 12.2* 11.1* 11.4*  HCT 42.7 39.4 40.7 37.6* 39.2  MCV 86.3 84.0 83.4 84.1 85.6  PLT 272 263 282 270 585    Basic Metabolic Panel: Recent Labs  Lab 03/21/19 1958 03/22/19 0659 03/22/19 1859 03/23/19 0803 03/24/19 0723  NA 145 150* 143 144 146*  K 3.5 3.4* 3.1* 3.3* 3.4*  CL 110 113* 107 108 107  CO2 32 29 28 28 30   GLUCOSE 140* 122* 197* 134* 100*  BUN 27* 23 21 17 16   CREATININE 1.26* 1.22 1.28* 1.16 1.32*  CALCIUM 9.2 9.4 8.9 8.9 9.2   GFR: Estimated Creatinine Clearance: 45.2 mL/min (A) (by C-G formula based on SCr of 1.32 mg/dL (H)). Recent Labs   Lab 03/20/19 0827 03/21/19 0600 03/22/19 0659 03/23/19 0803 03/24/19 0723  WBC 14.8* 14.6* 13.4* 11.3* 13.0*  LATICACIDVEN 1.7  --   --   --   --     Liver Function Tests: Recent Labs  Lab 03/20/19 0827  AST 13*  ALT 10  ALKPHOS 70  BILITOT 0.7  PROT 6.6  ALBUMIN 3.0*   No results for input(s): LIPASE, AMYLASE in the last 168 hours. No results for input(s): AMMONIA in the last 168 hours.  ABG    Component Value Date/Time   TCO2 26 08/29/2016 0014     Coagulation Profile: No results for input(s): INR, PROTIME in the last 168 hours.  Cardiac Enzymes: No results for input(s): CKTOTAL, CKMB, CKMBINDEX, TROPONINI in the last 168 hours.  HbA1C: Hemoglobin A1C  Date/Time Value Ref Range Status  06/07/2018 7.2  Final  02/04/2018 7.0  Final    CBG: Recent Labs  Lab 03/23/19 1209 03/23/19 1645 03/23/19 2042 03/24/19 0746 03/24/19 1210  GLUCAP 101* 96 90 86 91    Review of Systems:   Unable to complete as patient is altered.   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   Surgical History    Past Surgical History:  Procedure Laterality Date  . CARDIAC CATHETERIZATION  07/2010  . CRANIOTOMY  05/2008   for clipping of an incidental asymptomatic unruptured/notes 02/13/2011  . KNEE ARTHROSCOPY Left   . 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 SHOULDER ARTHROSCOPY W/ ROTATOR CUFF REPAIR Right      Social History   reports that he quit smoking about 35 years ago. His smoking use included cigarettes. He has a 40.00 pack-year smoking history. He has never used smokeless tobacco. He reports that he does not drink alcohol or use drugs.   Family History   His family history  includes Diabetes in his brother, paternal aunt, and sister; Pneumonia in his father; Stroke in his mother. There is no history of Colon cancer.   Allergies Allergies  Allergen Reactions  . Pioglitazone Other (See Comments)    Edema   . Rosiglitazone Maleate Other (See Comments)    edema     Home Medications  Prior to Admission medications   Medication Sig Start Date End Date Taking? Authorizing Provider  amLODipine (NORVASC) 10 MG tablet Take 10 mg by mouth every morning.    Yes [provider]  carvedilol (COREG) 12.5 MG tablet Take 1 tablet (12.5 mg total) by mouth 2 (two) times daily. 01/06/15  Yes Aldine Contes, MD  cefTRIAXone (ROCEPHIN) 1 g injection Inject 1 g into the muscle daily. Started on 03-18-19 Duration 6 days   Yes [provider]  Cholecalciferol (VITAMIN D) 125 MCG (5000 UT) CAPS Take 5,000 Units by mouth daily.    Yes [provider]  clopidogrel (PLAVIX) 75 MG tablet Take 75 mg by mouth at bedtime.   Yes [provider]  diclofenac sodium (VOLTAREN) 1 % GEL Apply 4 g topically 4 (four) times daily. Apply to Bilateral Knees   Yes [provider]  DOCUSATE SODIUM PO Take 1 capsule by mouth every 12 (twelve) hours as needed.   Yes [provider]  donepezil (ARICEPT) 10 MG tablet Take 1 tablet (10 mg total) by mouth at bedtime. 06/01/15  Yes Dennie Bible, NP  hydrALAZINE (APRESOLINE) 50 MG tablet Take 75 mg by mouth 4 (four) times daily.    Yes [provider]  insulin glargine (LANTUS) 100 UNIT/ML injection Inject 6 Units into the skin daily.    Yes [provider]  insulin lispro (HUMALOG) 100 UNIT/ML injection Inject 0-5 Units into the skin 3 (three) times daily before meals. Sliding scale 0-70= 0 units, 71-150= 0 units, 151-300= 3 units, 301-399=5 units, 400+ above call MD   Yes [provider]  ipratropium-albuterol (DUONEB) 0.5-2.5 (3) MG/3ML SOLN Take 1 mL by nebulization  every 6 (six) hours as needed (shortness of breath).   Yes [provider]  LACTOBACILLUS PO Take 1 capsule by mouth 2 (two) times daily. X 10 days, started on 03-17-19   Yes [provider]  lisinopril (ZESTRIL) 40 MG tablet Take 40 mg by mouth daily.    Yes [provider]  memantine (NAMENDA XR)  28 MG CP24 24 hr capsule Take 1 capsule (28 mg total) by mouth daily. 06/01/15  Yes Dennie Bible, NP  metFORMIN (GLUCOPHAGE) 500 MG tablet Take 500 mg by mouth daily.   Yes [provider]  mirabegron ER (MYRBETRIQ) 50 MG TB24 tablet Take 50 mg by mouth daily.   Yes [provider]  nitroGLYCERIN (NITROSTAT) 0.4 MG SL tablet Place 0.4 mg under the tongue every 5 (five) minutes as needed for chest pain.   Yes [provider]  Nutritional Supplements (NUTRITIONAL SUPPLEMENT PO) Low Concentrated Sweets Diet - regular texture, Regular / thin consistency   Yes [provider]  Polyethyl Glycol-Propyl Glycol (SYSTANE) 0.4-0.3 % SOLN Apply 1 drop to eye 2 (two) times daily. Both eyes   Yes [provider]  polyethylene glycol (MIRALAX / GLYCOLAX) packet Take 17 g by mouth daily.   Yes [provider]  prednisoLONE acetate (PRED FORTE) 1 % ophthalmic suspension Place 1 drop into the left eye 4 (four) times daily.   Yes [provider]  senna (SENOKOT) 8.6 MG tablet Take 1 tablet by mouth every 12 (twelve) hours as needed for constipation.   Yes [provider]  sertraline (ZOLOFT) 50 MG tablet Take 50 mg by mouth daily.  06/27/18  Yes [provider]  simvastatin (ZOCOR) 20 MG tablet Take 1 tablet (20 mg total) by mouth at bedtime. 05/29/16  Yes Aldine Contes, MD  tamsulosin (FLOMAX) 0.4 MG CAPS capsule Take 0.4 mg by mouth at bedtime.   Yes [provider]  tiZANidine (ZANAFLEX) 2 MG tablet Take 2 mg by mouth 2 (two) times daily. 02/06/18  Yes [provider]  torsemide (DEMADEX)  10 MG tablet Take 10 mg by mouth daily.   Yes [provider]  traMADol (ULTRAM) 50 MG tablet Take 1 tablet (50 mg total) by mouth every 6 (six) hours as needed for moderate pain or severe pain. 02/12/18  Yes Gildardo Cranker, DO     Critical care time: 60 minutes    Noe Gens, NP-C [facility] Pgr: [phone] or if no answer [phone] 03/24/2019, 1:45 PM

## 2019-03-24 NOTE — Progress Notes (Signed)
Per neurology recommendations, will transfer to neuro icu to start nicardipine drip. Critical care Dr. Windell Norfolk has been notified and accepted admission. Family notified of MRI results. Palliative care consult placed for goals of care discussion as patient remains full code.   Lars Mage, MD Internal Medicine PGY2 AQVOH:[phone] 03/24/2019, 12:26 PM

## 2019-03-24 NOTE — Consult Note (Addendum)
Neurology Consultation  Reason for Consult: Subdural and subarachnoid hemorrhage Referring Physician: [name]  CC: Subarachnoid hemorrhage and subdural hemorrhage  History is obtained from: Notes  HPI: [name] is a 78 y.o. male with history of type 2 diabetes, TIA, left thalamic infarct with residual left upper extremity paraparesis, hypertension, hyperlipidemia, CHF, cerebral aneurysm without rupture-right MCA aneurysm status post clipping.  Per chart patient was his normal self at the SNF approximately 03/16/2019.  Next morning he was found to have shortness of breath and had an x-ray that showed right lower lobe pneumonia.  Patient was admitted on 03/20/2019 for sepsis secondary to aspiration pneumonia.  On 03/23/2019 patient was noted to be more encephalopathic, initially thought to be metabolic encephalopathy with underlying sepsis.  However due to his significantly high blood pressures patient had a MRI.  MRI revealed acute/subacute subarachnoid and subdural hemorrhage along with petechial parenchymal hemorrhage in the left sylvian fissure and left frontal operculum.  Other areas of cortical/sub-arachnoid susceptibility are noted along the anterior cerebral falx bilaterally.  Subarachnoid blood was present anterior to the brainstem right greater than left.  For this reason neurology was consulted  Patient is still somewhat somnolent but apparently more awake than usual per nurse  LKW: 03/23/2019 tpa given?: no, intracranial hemorrhage Premorbid modified Rankin scale (mRS): 4 NIH stroke scale 15 ICH Score: 2   ROS:  Unable to obtain due to altered mental status.   Past Medical History:  Diagnosis Date  . Arthritis    "maybe"  . 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 CHF (congestive heart failure) ([place])   . Depression   . 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 Hyperlipemia   . Hypertension   . Sleep apnea    "my machine [place]; got to get me another one" (09/01/2014)  . Stroke [facility]) 2009   left thalamic infarct, residual LUE weakness  . TIA (transient ischemic attack)    multiple in past, most recently in 2011  . Type II diabetes mellitus (H[facility])       Family History  Problem Relation Age of Onset  . Stroke Mother   . Pneumonia Father   . Diabetes Sister   . Diabetes Brother   . Diabetes Paternal Aunt   . Colon cancer Neg Hx    Social History:   reports that he quit smoking about 35 years ago. His smoking use included cigarettes. He has a 40.00 pack-year smoking history. He has never used smokeless tobacco. He reports that he does not drink alcohol or use drugs.  Medications  Current Facility-Administered Medications:  .  0.9 %  sodium chloride infusion, , Intravenous, PRN, Aldine Contes, MD, Last Rate: 10 mL/hr at 03/21/19 0646, 250 mL at 03/21/19 0646 .  acetaminophen (TYLENOL) tablet 650 mg, 650 mg, Oral, Q6H PRN **OR** acetaminophen (TYLENOL) suppository 650 mg, 650 mg, Rectal, Q6H PRN, Alphonzo Grieve, MD, 650 mg at 03/23/19 1815 .  [COMPLETED] azithromycin (ZITHROMAX) 500 mg in sodium chloride 0.9 % 250 mL IVPB, 500 mg, Intravenous, Once, Last Rate: 250 mL/hr at 03/22/19 1420, 500 mg at 03/22/19 1420 **FOLLOWED BY** azithromycin (ZITHROMAX) 250 mg in dextrose 5 % 125 mL IVPB, 250 mg, Intravenous, Q24H, Chundi, Vahini, MD, Last Rate: 125 mL/hr at 03/23/19 1235, 250 mg at 03/23/19 1235 .  donepezil (ARICEPT)  tablet 10 mg, 10 mg, Oral, QHS, Svalina, Gorica, MD .  insulin aspart (novoLOG) injection 0-9 Units, 0-9 Units, Subcutaneous, TID WC, Alphonzo Grieve, MD, 1 Units at 03/23/19 0848 .  labetalol (NORMODYNE) injection 10 mg, 10 mg, Intravenous, Q2H, [name], MD, 10 mg at 03/24/19 1140 .  MEDLINE mouth rinse, 15 mL, Mouth Rinse, BID, Narendra, Nischal, MD, 15 mL at 03/24/19 1137 .   memantine (NAMENDA XR) 24 hr capsule 28 mg, 28 mg, Oral, Daily, Svalina, Gorica, MD, 28 mg at 03/24/19 1131 .  mirabegron ER (MYRBETRIQ) tablet 50 mg, 50 mg, Oral, Daily, Svalina, Gorica, MD, 50 mg at 03/24/19 1131 .  piperacillin-tazobactam (ZOSYN) IVPB 3.375 g, 3.375 g, Intravenous, Q8H, Narendra, Nischal, MD, Last Rate: 12.5 mL/hr at 03/24/19 1123, 3.375 g at 03/24/19 1123 .  polyethylene glycol (MIRALAX / GLYCOLAX) packet 17 g, 17 g, Oral, Daily, Svalina, Gorica, MD, 17 g at 03/24/19 1132 .  polyvinyl alcohol (LIQUIFILM TEARS) 1.4 % ophthalmic solution 1 drop, 1 drop, Both Eyes, BID, Svalina, Gorica, MD, 1 drop at 03/23/19 2107 .  prednisoLONE acetate (PRED FORTE) 1 % ophthalmic suspension 1 drop, 1 drop, Left Eye, TID AC & HS, Alphonzo Grieve, MD, 1 drop at 03/24/19 0654 .  senna (SENOKOT) tablet 8.6 mg, 1 tablet, Oral, Q12H PRN, Jari Favre, Gorica, MD .  sertraline (ZOLOFT) tablet 50 mg, 50 mg, Oral, Daily, Svalina, Gorica, MD, 50 mg at 03/24/19 1132 .  sodium chloride flush (NS) 0.9 % injection 10-40 mL, 10-40 mL, Intracatheter, Q12H, [name], Nischal, MD, 10 mL at 03/23/19 2103 .  sodium chloride flush (NS) 0.9 % injection 10-40 mL, 10-40 mL, Intracatheter, PRN, [name], Nischal, MD .  sodium chloride flush (NS) 0.9 % injection 10-40 mL, 10-40 mL, Intracatheter, Q12H, Narendra, Nischal, MD .  sodium chloride flush (NS) 0.9 % injection 10-40 mL, 10-40 mL, Intracatheter, PRN, Aldine Contes, MD   Exam: Current vital signs: BP (!) 198/82 (BP Location: Right Leg)   Pulse 78   Temp 99.1 F (37.3 C) (Axillary)   Resp (!) 21   Ht 5\' 7"  (1.702 m)   Wt 75.9 kg   SpO2 100%   BMI 26.21 kg/m  Vital signs in last 24 hours: Temp:  [98.9 F (37.2 C)-102 F (38.9 C)] 99.1 F (37.3 C) (06/09 0441) Pulse Rate:  [56-79] 78 (06/09 1135) Resp:  [15-21] 21 (06/09 1135) BP: (137-201)/(58-82) 198/82 (06/09 1135) SpO2:  [95 %-100 %] 100 % (06/09 1135) Weight:  [75.9 kg] 75.9 kg (06/09  0457)  Physical Exam  Constitutional: Appears well-developed and well-nourished.  Psych: Abulic Eyes: No scleral injection HENT: No OP obstrucion Head: Normocephalic.  Cardiovascular: Normal rate and regular rhythm.  Respiratory: Effort normal, non-labored breathing GI: Soft.  No distension. There is no tenderness.  Skin: WDI  Neuro: Mental Status: Patient is somnolent, he is minimally verbal.  Given that he is not oriented to place that I could understand he was able to follow simple commands such as squeezing my hand with his right hand, raising his right arm, following my finger with his eyes. Cranial Nerves: II: Visual Fields are full.  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  Pupils were 2 mm and reactive bilaterally  V: Facial sensation is symmetric to temperature VII: Facial movement is symmetric.  VIII: hearing is intact to voice X: Palat elevates symmetrically XI: Shoulder shrug is decreased on the left XII: tongue is  midline without atrophy or fasciculations.  Motor: Right upper extremity was 5/5, right lower extremity has significant pain however he was able to move it against gravity.  Left upper extremity had plegia but he was able to squeeze my hand.  Left leg showed paralysis and increased tone on left arm and leg. Sensory: Sensation is symmetric to light touch per patient Deep Tendon Reflexes: 2+ on right arm and right patella.  Left arm and left patella had increased reflex Plantars: Upgoing toe on the left Cerebellar: Unable to understand and difficulty doing finger-to-nose with the right hand    Labs I have reviewed labs in epic and the results pertinent to this consultation are:   CBC    Component Value Date/Time   WBC 13.0 (H) 03/24/2019 0723   RBC 4.58 03/24/2019 0723   HGB 11.4 (L) 03/24/2019 0723   HCT 39.2 03/24/2019 0723   PLT 262 03/24/2019 0723   MCV 85.6  03/24/2019 0723   MCH 24.9 (L) 03/24/2019 0723   MCHC 29.1 (L) 03/24/2019 0723   RDW 16.6 (H) 03/24/2019 0723   RDW 16.5 (H) 02/17/2014 1524   LYMPHSABS 1.5 03/24/2019 0723   LYMPHSABS 2.1 02/17/2014 1524   MONOABS 1.0 03/24/2019 0723   EOSABS 0.0 03/24/2019 0723   EOSABS 0.1 02/17/2014 1524   BASOSABS 0.0 03/24/2019 0723   BASOSABS 0.0 02/17/2014 1524    CMP     Component Value Date/Time   NA 146 (H) 03/24/2019 0723   NA 148 (A) 06/07/2018   K 3.4 (L) 03/24/2019 0723   CL 107 03/24/2019 0723   CO2 30 03/24/2019 0723   GLUCOSE 100 (H) 03/24/2019 0723   BUN 16 03/24/2019 0723   BUN 19 06/07/2018   CREATININE 1.32 (H) 03/24/2019 0723   CREATININE 1.15 01/06/2015 1024   CALCIUM 9.2 03/24/2019 0723   PROT 6.6 03/20/2019 0827   PROT 6.7 02/17/2014 1524   ALBUMIN 3.0 (L) 03/20/2019 0827   ALBUMIN 4.1 02/17/2014 1524   AST 13 (L) 03/20/2019 0827   ALT 10 03/20/2019 0827   ALKPHOS 70 03/20/2019 0827   BILITOT 0.7 03/20/2019 0827   GFRNONAA 52 (L) 03/24/2019 0723   GFRNONAA 64 01/06/2015 1024   GFRAA >60 03/24/2019 0723   GFRAA 74 01/06/2015 1024    Lipid Panel     Component Value Date/Time   CHOL 153 06/07/2018   TRIG 94 06/07/2018   TRIG 133 03/28/2007 1347   HDL 39 06/07/2018   CHOLHDL 2.2 06/08/2014 1622   VLDL 9 06/08/2014 1622   LDLCALC 95 06/07/2018     Imaging I have reviewed the images obtained:  CTA of head and neck- intraventricular hemorrhage bilaterally with ventricular enlargement compared with 2017.  This is likely due to the hydrocephalus given the acute hemorrhage.  Subarachnoid hemorrhage in the sylvian fissure bilaterally.  No hemorrhage in the basilar cisterns.  Hemorrhage pattern is not typical for aneurysmal rupture and may be due to trauma and or coagulopathy.  MRI examination of the brain-Acute/subacute subarachnoid and subdural hemorrhage is described. Subdural blood is most evident in the left sylvian fissure and prepontine space.  Subdural  blood is most evident posteriorly, right greater than left.Layering blood products within the lateral ventricles bilaterally without hydrocephalus.Petechial parenchymal hemorrhage is present in the left frontal operculum and to a lesser extent posterior right frontal operculum. Additional subarachnoid blood and possible parenchymal petechial hemorrhage is noted in the anteromedial frontal lobes bilaterally along the falx.  Multiple punctate foci of  susceptibility suggesting underlying vasculitis. This could be the source of hemorrhage. Aneurysmal bleed is not excluded. Right MCA aneurysm clip in place. Susceptibility from the blood products without acute restricted diffusion, ischemic stroke.  Etta Quill PA-C [facility] [phone]  M-F  (9:00 am- 5:00 PM)  03/24/2019, 11:45 AM   I have seen the patient and reviewed the above note.  He is a 78 year old who presented with altered mental status and was found to have extensive subarachnoid and subdural and intraventricular hematomas.  Per his wife, he has fallen several times at the nursing home, and I think it is possible that this could be related, though coagulopathy would also be possible.  Assessment:  78 year old male with intraventricular, subdural, subarachnoid hemorrhage.  Possibly traumatic, possibly related to sepsis per neurosurgery.  I think vasculitis is much less likely.  Recommendations: [place] -Stop all antiplatelets and use SCDs for DVT prophylaxis - Patient's blood pressure systolically should be no more than 160, patient likely will need a IV antihypertensive as he continues to be systolically in the 353I and has not dropped below 150s. --Check INR, PTT, correct if INR is greater than 1.5 --Stroke team to follow  Roland Rack, MD [facility] [phone]  If 7pm- 7am, please page neurology on call as listed in [place].

## 2019-03-24 NOTE — Progress Notes (Signed)
Pharmacy Antibiotic Note  [name] is a 78 y.o. male admitted on 03/20/2019 with pneumonia. Patient has been on Unasyn but continues to be febrile with elevated WBC. Pharmacy has been consulted for Zosyn dosing. Scr 1.32, estimated CrCl ~45 ml/min.  Plan: Zosyn 3.375g IV q8h F/u clinical status, C&S, renal function, de-escalation, LOT  Height: 5\' 7"  (170.2 cm) Weight: 167 lb 5.3 oz (75.9 kg) IBW/kg (Calculated) : 66.1  Temp (24hrs), Avg:99.8 F (37.7 C), Min:98.5 F (36.9 C), Max:102 F (38.9 C)  Recent Labs  Lab 03/20/19 0827 03/20/19 0914  03/21/19 0600 03/21/19 1958 03/22/19 0659 03/22/19 1859 03/23/19 0803 03/24/19 0723  WBC 14.8*  --   --  14.6*  --  13.4*  --  11.3* 13.0*  CREATININE 1.68*  --    < > 1.31* 1.26* 1.22 1.28* 1.16 1.32*  LATICACIDVEN 1.7  --   --   --   --   --   --   --   --   VANCORANDOM  --  <4  --   --   --   --   --   --   --    < > = values in this interval not displayed.    Estimated Creatinine Clearance: 45.2 mL/min (A) (by C-G formula based on SCr of 1.32 mg/dL (H)).    Allergies  Allergen Reactions  . Pioglitazone Other (See Comments)    Edema   . Rosiglitazone Maleate Other (See Comments)    edema   Antimicrobials this admission: Zosyn 6/5 x1, 6/6 >> Unasyn 6/5 >> 6/6 Vancomycin 6/5 x1  Microbiology results: 6/5 MRSA PCR: neg 6/5 COVID: neg 6/5 BCx: NG < 24h  Yamil Dougher A. Levada Dy, PharmD, [place] Please utilize Amion for appropriate phone number to reach the unit pharmacist ([place])   03/24/2019 8:50 AM

## 2019-03-24 NOTE — Progress Notes (Addendum)
Neuroradiologist called with MRI findings of multiple subdural, arachnoid,and parenchymal bleeds noted.   Thought to be traumatic etiology vs vasculitis.   -Placed order for CTA head and neck  -Discontinuing plavix and lovenox -Consulted neurosurgery and neurology, Dr.Elsner and Dr.Kirkpatrick to follow   Lars Mage, MD Internal Medicine PGY2 Pager:[phone] 03/24/2019, 9:09 AM

## 2019-03-24 NOTE — Progress Notes (Signed)
   Subjective:    Patient is able to wake up and say "hey". He is able to follow commands of opening eyes and squeeze provider with right hand, Able to lift right leg.   Objective:  Vital signs in last 24 hours: Vitals:   03/24/19 0539 03/24/19 0702 03/24/19 0715 03/24/19 0801  BP: (!) 186/66 (!) 181/74  (!) 201/70  Pulse:  62 70 66  Resp:  16  16  Temp:      TempSrc:      SpO2:  97%  98%  Weight:      Height:       General: Lying in bed in no acute distress Neuro: He arrouses to verbal and tactile stimuli. He is able to say "hey" and follow commands. 3/5 right grip strength. 3/5 RLE strength. 0/5 strength LUE and LLE. Pinpoint pupils bilaterally, minimally reactive to light.   Assessment/Plan:  Principal Problem:   Sepsis ([place]) Active Problems:   AKI (acute kidney injury) ([place])   Community acquired pneumonia   Hypertensive urgency   Aspiration pneumonia ([facility])   Somnolence   [name] is a 78yo male with hyperaldosteronism, HTN, T2DM, neurogenic bladder s/p suprapubic catheter, h/o CVA with left sided hemiparesis,andvascular dementiawhois admitted for acute encephalopathy and treatment for aspiration pneumonia.  Acute Encephalopathy, Brain hemorrhage: Initially thought to be metabolic encephalopathy 2/2 pneumonia. Although his leukocytosis and repeat CXR had much improved he continued to be encephalopathic with periodic fevers. Brain MRI performed showed acute/subacute subarachnoid and subdural hemorrhage as well as several punctate hemorrhages and blood in the lateral ventricles. Unclear if this maybe contributed to his acute encephalopathy. We have consulted both neurology and neurosurgery.  - Ordered CTA head and neck  - Follow-up neurology and neurosurgery recs  Sepsis2/2Aspiration PNA:He is currently on Unasyn and azithromycin.  His leukocytosis continues to improve.  He does however continue to have fevers which maybe due to his brain bleed. -Continue  Unasyn and azithromycin -Blood cultures no growth to date -Continue supplemental oxygen as needed. -Tylenol PRN pain and fever - Failed SLP eval due to being somnolent.  Will keep n.p.o. for now. -Continuous cardiac monitoring  Hypertensive urgency:He continues to be hypertensive despite use of labetalol. Will hold off on changing antihypertensive regiment for now till he sees neurology and neurosurgery.  - Continue labetalol 10 mg every 2 hours.  Acute kidney injury: Creatinineback to baseline with IV fluids.  We will continue to monitor.  Type 2 diabetes:BS within acceptable limits. -Hold Lantus since he is n.p.o. - ContinueNovoLog SSI 3 times daily with meals  Dispo: Anticipated discharge pending clinical improvement.   [name], MD 03/24/2019, 9:43 AM Pager: Pager: [phone]

## 2019-03-24 NOTE — Progress Notes (Signed)
  Speech Language Pathology Treatment: Dysphagia  Patient Details Name: [name] MRN: [id number] DOB: 06/22/1941 Today's Date: 03/24/2019 Time: 6945-0388 SLP Time Calculation (min) (ACUTE ONLY): 26 min  Assessment / Plan / Recommendation Clinical Impression  Pt today seen to determine readiness for po intake.  Today he is alert and responding verbally or nonverbally approximately 25% of opportunities.  He has a congested cough at baseline that did not clear despite cued effortful coughing.    Intake of puree, nectar and thin provided with findings consistent with possible delayed swallowing- at times multiple swallows. Delayed cough noted after all intake with discoordinated swallow with 3 ounce Yale water test attempts. Suspect possible cricopharyngeal deficits given his audible multiple swallows with post=swallow throat clearing/belching.   Pt admits to h/o "food/drink coming back up" with direct question cue.  Note he continues to have fevers at this time.    Plan to transfer to ICU per pt's RN.  Recommend conduct MBS next am to allow instrumental assessment/viewing of anatomy/physiology of swallowing and to determine LRD and effective compensation strategies especially given pt's asp pna, h/o dementia/neuro diagnosis.  For today, recommend only tsps of clears from floor stock and medications with puree *crushed*.  Pt and RN informed of recommendations and sign posted at foot of bed to allow to transfer with pt.   Of note, pt also appeared during session to clench his teeth tightly intermittently *more on left than right* concerning for possible focal seizure activity vs dementia related.  RN made aware and he advised neuro was going to come back to see pt.      HPI HPI: 78yo male with PMH of hyperaldosteronism, HTN, T2DM, neurogenic bladder s/p suprapubic catheter, h/o CVA with left sided hemiparesis, vascular dementia presenting from [facility] due to nonresolving pneumonia. CT  head did not reveal any acute abnormality. MR Brain ordered on 6/8 but not completed at the time of this update to HPI.      SLP Plan  Continue with current plan of care;MBS(consider MBS if pt plans indicate necessary)       Recommendations  Diet recommendations: Other(comment)(clears via tsp, medicine with puree - crushed) Liquids provided via: Teaspoon Medication Administration: Crushed with puree Supervision: Full supervision/cueing for compensatory strategies Compensations: Slow rate;Small sips/bites Postural Changes and/or Swallow Maneuvers: Seated upright 90 degrees;Upright 30-60 min after meal                Oral Care Recommendations: Oral care QID Follow up Recommendations: Other (comment)(tbd) SLP Visit Diagnosis: Dysphagia, oropharyngeal phase (R13.12) Plan: Continue with current plan of care;MBS(consider MBS if pt plans indicate necessary)       GO                Macario Golds 03/24/2019, 1:39 PM   Luanna Salk, [facility] SLP Acute Rehab Services Pager [phone] Office [phone]

## 2019-03-24 NOTE — Progress Notes (Signed)
MD. [name] notify of pt IV site infiltration, new IV site in place. CT notify of IV placement and orders for transport, swat nurse on board to take pt to CT. Vitals BP: 182/65, HR 70, rr 15, oxygen sat 97 on 2L [place].

## 2019-03-24 NOTE — Progress Notes (Signed)
Labetalol due at 0430. BP 184/66, HR 56. MD on call paged.  RN instructed to hold labetalol and new order for hydralazine placed.

## 2019-03-24 NOTE — Progress Notes (Signed)
Pt unable to take metoprolol PO due to lethargy and AMS. Labetalol scheduled every 2 hr. BP 167/68, HR borderline 60. MD on call paged. RN instructed to hold labetalol scheduled for 0230.

## 2019-03-25 ENCOUNTER — Inpatient Hospital Stay (HOSPITAL_COMMUNITY): Payer: Medicare Other

## 2019-03-25 DIAGNOSIS — I609 Nontraumatic subarachnoid hemorrhage, unspecified: Secondary | ICD-10-CM

## 2019-03-25 DIAGNOSIS — R627 Adult failure to thrive: Secondary | ICD-10-CM

## 2019-03-25 LAB — GLUCOSE, CAPILLARY
Glucose-Capillary: 137 mg/dL — ABNORMAL HIGH (ref 70–99)
Glucose-Capillary: 118 mg/dL — ABNORMAL HIGH (ref 70–99)
Glucose-Capillary: 130 mg/dL — ABNORMAL HIGH (ref 70–99)
Glucose-Capillary: 98 mg/dL (ref 70–99)
Glucose-Capillary: 129 mg/dL — ABNORMAL HIGH (ref 70–99)

## 2019-03-25 LAB — CBC
HCT: 37.7 % — ABNORMAL LOW (ref 39.0–52.0)
Hemoglobin: 11.3 g/dL — ABNORMAL LOW (ref 13.0–17.0)
MCH: 25.1 pg — ABNORMAL LOW (ref 26.0–34.0)
MCHC: 30 g/dL (ref 30.0–36.0)
MCV: 83.6 fL (ref 80.0–100.0)
Platelets: 307 10*3/uL (ref 150–400)
RBC: 4.51 MIL/uL (ref 4.22–5.81)
RDW: 16.5 % — ABNORMAL HIGH (ref 11.5–15.5)
WBC: 13.3 10*3/uL — ABNORMAL HIGH (ref 4.0–10.5)
nRBC: 0 % (ref 0.0–0.2)

## 2019-03-25 LAB — CULTURE, BLOOD (ROUTINE X 2)
Culture: NO GROWTH
Special Requests: ADEQUATE

## 2019-03-25 LAB — BASIC METABOLIC PANEL
Anion gap: 12 (ref 5–15)
BUN: 15 mg/dL (ref 8–23)
CO2: 26 mmol/L (ref 22–32)
Calcium: 9.2 mg/dL (ref 8.9–10.3)
Chloride: 105 mmol/L (ref 98–111)
Creatinine, Ser: 1.05 mg/dL (ref 0.61–1.24)
GFR calc Af Amer: >60 mL/min (ref >60)
GFR calc non Af Amer: >60 mL/min (ref >60)
Glucose, Bld: 161 mg/dL — ABNORMAL HIGH (ref 70–99)
Potassium: 3.3 mmol/L — ABNORMAL LOW (ref 3.5–5.1)
Sodium: 143 mmol/L (ref 135–145)

## 2019-03-25 LAB — PHOSPHORUS: Phosphorus: 2.3 mg/dL — ABNORMAL LOW (ref 2.5–4.6)

## 2019-03-25 LAB — MAGNESIUM: Magnesium: 2 mg/dL (ref 1.7–2.4)

## 2019-03-25 MED ORDER — AMLODIPINE BESYLATE 10 MG PO TABS
10.0000 mg | ORAL_TABLET | Freq: Every day | ORAL | Status: DC
  Administered 2019-03-25 – 2019-04-02 (×9): 10 mg via ORAL
  Filled 2019-03-25 (×9): qty 1

## 2019-03-25 MED ORDER — LABETALOL HCL 5 MG/ML IV SOLN
10.0000 mg | INTRAVENOUS | Status: DC | PRN
  Administered 2019-03-25 – 2019-03-27 (×14): 20 mg via INTRAVENOUS
  Administered 2019-03-27: 10 mg via INTRAVENOUS
  Administered 2019-03-27 – 2019-03-28 (×6): 20 mg via INTRAVENOUS
  Filled 2019-03-25 (×15): qty 4

## 2019-03-25 MED ORDER — LISINOPRIL 40 MG PO TABS
40.0000 mg | ORAL_TABLET | Freq: Every day | ORAL | Status: DC
  Administered 2019-03-25 – 2019-04-02 (×9): 40 mg via ORAL
  Filled 2019-03-25: qty 2
  Filled 2019-03-25 (×2): qty 1
  Filled 2019-03-25 (×5): qty 2
  Filled 2019-03-25: qty 1

## 2019-03-25 MED ORDER — POTASSIUM CHLORIDE 20 MEQ PO PACK
40.0000 meq | PACK | Freq: Two times a day (BID) | ORAL | Status: AC
  Administered 2019-03-25: 23:00:00 40 meq via ORAL
  Filled 2019-03-25: qty 2

## 2019-03-25 MED ORDER — GERHARDT'S BUTT CREAM
TOPICAL_CREAM | Freq: Three times a day (TID) | CUTANEOUS | Status: DC
  Administered 2019-03-25: 13:00:00 via TOPICAL
  Administered 2019-03-25 (×2): 1 via TOPICAL
  Administered 2019-03-26 – 2019-03-28 (×8): via TOPICAL
  Administered 2019-03-28: 1 via TOPICAL
  Administered 2019-03-29 – 2019-03-30 (×5): via TOPICAL
  Administered 2019-03-30: 1 via TOPICAL
  Administered 2019-03-31 – 2019-04-02 (×7): via TOPICAL
  Filled 2019-03-25: qty 1

## 2019-03-25 MED ORDER — CARVEDILOL 12.5 MG PO TABS
12.5000 mg | ORAL_TABLET | Freq: Two times a day (BID) | ORAL | Status: DC
  Administered 2019-03-25 – 2019-03-28 (×6): 12.5 mg via ORAL
  Filled 2019-03-25 (×6): qty 1

## 2019-03-25 NOTE — Consult Note (Signed)
NAME:  [name], MRN:  [id number], DOB:  May 26, 1941, LOS: 5 ADMISSION DATE:  03/20/2019, CONSULTATION DATE:  03/24/19 REFERRING MD:  Dr. [name], CHIEF COMPLAINT:  [facility] / [place]    Brief History   78 y/o M admitted 6/5 with concern for possible PNA.  Developed worsening mental status 6/8 with subsequent MRI showing acute / subacute subarachnoid & subdural hemorrhage.   History of present illness   78 y/o M who was admitted on 6/5 from a SNF with hypertension, fever, tachypnea, hypoxemia and concern for RLL PNA based on CXR. He was sent to [facility] for evaluation 6/5 and admitted with concern for sepsis secondary to aspiration PNA.  Initial work up notable for hypernatremia, AKI, leukocytosis and mild anemia. The patient was admitted for further evaluation per IMTS.  He was treated with IV antibiotics for HCAP.  On the am of 6/8, he was noted to be altered initially thought related to sepsis.  BP was also significantly elevated and and MRI was ordered.  MRI showed an acute / subacute subarachnoid & subdural hemorrhage.  The patient was evaluated by Neurology and transferred to ICU.    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 Subdural blood most evident in the left sylvian fissure & prepontine space.  Subdural blood most evident posteriorly R>L.  Layering blood products within the lateral ventricles bilaterally w/o hydrocephalus, additional subarachnoid blood and possible parenchymal petechial hemorrhage int he anteromedial frontal lobes  bilaterally, R MCA aneurysm clip in place CTA Head / Neck 6/9 >> intraventricular hemorrhage bilaterally with ventricular enlargement, likely due to acute hemorrhage, SAH in the sylvian fissures bilaterally, pattern not typical of aneurysm fupture, surgical clipping of R MCA aneurysm, atrophy with chronic microvascular ischemic changes   Micro Data:  BCx2 6/5 >> 1/2 diptheroids  BCID 6/5 >> negative   Antimicrobials:  Unasyn 6/5 >> 6/6 Azithro 6/7 >> 6/9  Zosyn 6/5 >> 6/9   Interim history/subjective:  Calm but disoriented. Limited verbal responses to questioning.  Objective   Blood pressure (!) 132/29, pulse 90, temperature (!) 100.4 F (38 C), temperature source Axillary, resp. rate 16, height 5\' 7"  (1.702 m), weight 77.1 kg, SpO2 96 %.        Intake/Output Summary (Last 24 hours) at 03/25/2019 1347 Last data filed at 03/25/2019 1200 Gross per 24 hour  Intake 828.94 ml  Output 450 ml  Net 378.94 ml   Filed Weights   03/24/19 0457 03/24/19 1430 03/25/19 0500  Weight: 75.9 kg 77 kg 77.1 kg    Examination: General: chronically ill appearing male in NAD HEENT: MM pink/moist, pupils 2-3 mm reactive Neuro: Awakens to voice, follows simple commands on right, flicker on left with painful stimuli  CV: s1s2 rrr, SR with frequent PVC's PULM: even/non-labored, lungs bilaterally clear  GI: soft, non-tender, bsx4 hyperactive, suprapubic catheter in place  Extremities: warm/dry, no edema  Skin: no rashes or lesions  Resolved Hospital Problem list      Assessment &  Plan:   Subarachnoid hemorrhage with intraventricular extension Subdural hemorrhage  Hypertensive Crisis - suspected traumatic etiology, ? Fall PTA - SBP goal 140-133mmHg - Clevidipine infusion for BP goal - Hold antiplatelet agents - Transition to enteral anti-HTN medications.  HCAP vs Aspiration (SNF resident) - Stop antibiotics as no radiographic or microbiologic evidence to suggest pneumonia.   DM2 - CBG  monitoring and SSI  Dementia  CVA history with residual Left hemiparesis. Unable to perform ADLs or even move his wheelchair.  - Supportive care - Palliative care has been consulted by primary team.  - continue aricept, namenda, zoloft for now  HTN HLD CHF  - Cleviprex gtt as above  Suprapubic Catheter  -catheter care per protocol  -continue myrbetriq   OSA  - not on CPAP  - consider CPAP once mental status improves   Best practice:  Diet: NPO pending swallow evaluation.  Pain/Anxiety/Delirium protocol (if indicated): NA VAP protocol (if indicated): NA DVT prophylaxis: SCD GI prophylaxis: PPI Glucose control: SSI Mobility: BR Code Status: FULL Family Communication: Wife ([name]) updated on patients status in detail 6/9. Introduced concept of CPR / short term ventilation.  Recommended no CPR in the event of cardiac arrest due to underlying comorbid conditions.  Consider short term ventilation if Neurology feels appropriate and patient were to need. Family would like to discuss and think about his plan.  Difficult for them to make decisions as they have not seen him since March due to [place].   Dysphagia and feeding tube placement should factor into decision making. Disposition: ICU for antihypertensive infusion   Labs   CBC: Recent Labs  Lab 03/20/19 0827 03/21/19 0600 03/22/19 0659 03/23/19 0803 03/24/19 0723 03/25/19 0524  WBC 14.8* 14.6* 13.4* 11.3* 13.0* 13.3*  NEUTROABS 12.4*  --  10.9* 8.6* 10.5*  --   HGB 12.4* 11.6* 12.2* 11.1* 11.4* 11.3*  HCT 42.7 39.4 40.7 37.6* 39.2 37.7*  MCV 86.3 84.0 83.4 84.1 85.6 83.6  PLT 272 263 282 270 262 299    Basic Metabolic Panel: Recent Labs  Lab 03/22/19 0659 03/22/19 1859 03/23/19 0803 03/24/19 0723 03/25/19 0524  NA 150* 143 144 146* 143  K 3.4* 3.1* 3.3* 3.4* 3.3*  CL 113* 107 108 107 105  CO2 29 28 28 30 26   GLUCOSE 122* 197* 134* 100* 161*  BUN 23 21 17 16 15   CREATININE 1.22 1.28* 1.16 1.32* 1.05   CALCIUM 9.4 8.9 8.9 9.2 9.2  MG  --   --   --   --  2.0  PHOS  --   --   --   --  2.3*   GFR: Estimated Creatinine Clearance: 56.8 mL/min (by C-G formula based on SCr of 1.05 mg/dL). Recent Labs  Lab 03/20/19 0827  03/22/19 0659 03/23/19 0803 03/24/19 0723 03/25/19 0524  WBC 14.8*   < > 13.4* 11.3* 13.0* 13.3*  LATICACIDVEN 1.7  --   --   --   --   --    < > = values in this interval not displayed.    Liver Function Tests: Recent Labs  Lab 03/20/19 0827  AST 13*  ALT 10  ALKPHOS 70  BILITOT 0.7  PROT 6.6  ALBUMIN 3.0*   No results for input(s): LIPASE, AMYLASE in the last 168 hours. No results for input(s): AMMONIA in the last 168 hours.  ABG    Component Value Date/Time   TCO2 26 08/29/2016 0014     Coagulation Profile: Recent Labs  Lab 03/24/19 2143  INR 1.2    Cardiac Enzymes: No results for input(s): CKTOTAL, CKMB, CKMBINDEX, TROPONINI in the last 168 hours.  HbA1C: Hemoglobin A1C  Date/Time Value Ref Range Status  06/07/2018 7.2  Final  02/04/2018 7.0  Final    CBG: Recent Labs  Lab 03/24/19 1210 03/24/19 1652 03/24/19 2224 03/25/19 0822 03/25/19 1153  GLUCAP 91 149* 121* [place], MD [facility] ICU Physician [place]  Pager: [phone] Mobile: [phone] After hours: [phone].  03/25/2019, 1:56 PM       03/25/2019, 1:47 PM

## 2019-03-25 NOTE — Progress Notes (Addendum)
STROKE TEAM PROGRESS NOTE   INTERVAL HISTORY I have reviewed history of presenting illness the patient.  I also spoke to the patient's wife and daughter at the bedside and answered questions.  He has a history of baseline dementia nursing home for several months now because of recent falls at home and inability of the family to care for him.  As per his wife he has had several falls in a nursing home as well.  Vitals:   03/25/19 0700 03/25/19 0800 03/25/19 0900 03/25/19 1000  BP: (!) 146/65 (!) 155/59 (!) 147/65 (!) 160/68  Pulse: 78 78 82 84  Resp: 15 19 18 17   Temp:  98.8 F (37.1 C)    TempSrc:  Oral    SpO2: 100% 100% 100% 96%  Weight:      Height:        CBC:  Recent Labs  Lab 03/23/19 0803 03/24/19 0723 03/25/19 0524  WBC 11.3* 13.0* 13.3*  NEUTROABS 8.6* 10.5*  --   HGB 11.1* 11.4* 11.3*  HCT 37.6* 39.2 37.7*  MCV 84.1 85.6 83.6  PLT 270 262 916    Basic Metabolic Panel:  Recent Labs  Lab 03/24/19 0723 03/25/19 0524  NA 146* 143  K 3.4* 3.3*  CL 107 105  CO2 30 26  GLUCOSE 100* 161*  BUN 16 15  CREATININE 1.32* 1.05  CALCIUM 9.2 9.2  MG  --  2.0  PHOS  --  2.3*   Lipid Panel:     Component Value Date/Time   CHOL 153 06/07/2018   TRIG 94 06/07/2018   TRIG 133 03/28/2007 1347   HDL 39 06/07/2018   CHOLHDL 2.2 06/08/2014 1622   VLDL 9 06/08/2014 1622   LDLCALC 95 06/07/2018   HgbA1c:  Lab Results  Component Value Date   HGBA1C 7.2 06/07/2018   Urine Drug Screen:     Component Value Date/Time   LABOPIA NONE DETECTED 08/06/2012 1900   COCAINSCRNUR NONE DETECTED 08/06/2012 1900   COCAINSCRNUR NEGATIVE 03/11/2009 0532   LABBENZ NONE DETECTED 08/06/2012 1900   LABBENZ NEGATIVE 03/11/2009 0532   AMPHETMU NONE DETECTED 08/06/2012 1900   THCU NONE DETECTED 08/06/2012 1900   LABBARB NONE DETECTED 08/06/2012 1900    Alcohol Level     Component Value Date/Time   ETH <11 08/06/2012 1705    IMAGING Ct Code Stroke Cta Head W/wo  Contrast  Result Date: 03/24/2019 CLINICAL DATA:  Intracranial hemorrhage. History of aneurysm clipping EXAM: CT ANGIOGRAPHY HEAD AND NECK TECHNIQUE: Multidetector CT imaging of the head and neck was performed using the standard protocol during bolus administration of intravenous contrast. Multiplanar CT image reconstructions and MIPs were obtained to evaluate the vascular anatomy. Carotid stenosis measurements (when applicable) are obtained utilizing NASCET criteria, using the distal internal carotid diameter as the denominator. CONTRAST:  70mL OMNIPAQUE IOHEXOL 350 MG/ML SOLN COMPARISON:  MRI head 03/24/2019, CT head 05/23/2016 FINDINGS: CT HEAD FINDINGS Brain: Acute/subacute hemorrhage is present in the subarachnoid space bilaterally. Layering blood products in the occipital horns bilaterally. No significant hemorrhage in the basilar cisterns. Small subdural hemorrhages posteriorly are better seen on the MRI today. Moderate atrophy. Ventricular enlargement is present which has progressed since 2017 and may represent hydrocephalus or progression of atrophy. Chronic ischemic changes throughout the white matter and basal ganglia. No acute ischemic infarct. No mass lesion. Vascular: Clipping of right MCA aneurysm. Negative for hyperdense vessel Skull: Right frontal craniotomy for aneurysm clipping Sinuses: Negative Orbits: Bilateral cataract surgery. Review of  the MIP images confirms the above findings CTA NECK FINDINGS Aortic arch: Mild atherosclerotic disease aortic arch without aneurysm or dissection. Proximal great vessels widely patent Right carotid system: Mild atherosclerotic disease right carotid bifurcation without stenosis Left carotid system: Atherosclerotic disease left carotid bifurcation. 20% diameter stenosis due to calcified and noncalcified plaque in the proximal left internal carotid artery. Vertebral arteries: Both vertebral arteries patent to the basilar without significant stenosis. Skeleton:  Cervical spondylosis without acute skeletal abnormality. Other neck: Negative Upper chest: Mild airspace disease in the upper lobes bilaterally. Probable atelectasis. Review of the MIP images confirms the above findings CTA HEAD FINDINGS Anterior circulation: Atherosclerotic calcification in the cavernous carotid bilaterally. Moderate stenosis right supraclinoid internal carotid artery and mild stenosis left cavernous carotid. Anterior and middle cerebral arteries patent bilaterally without significant stenosis Aneurysm clip right MCA bifurcation without recurrent aneurysm. Posterior circulation: Both vertebral arteries patent to the basilar without stenosis. Right PICA patent. Left PICA not visualized. Left AICA patent. Superior cerebellar and posterior cerebral arteries patent bilaterally. Venous sinuses: Normal venous enhancement Anatomic variants: None Delayed phase: Not perform Review of the MIP images confirms the above findings IMPRESSION: 1. Intraventricular hemorrhage bilaterally with ventricular enlargement compared with 2017. This is likely due to hydrocephalus given the acute hemorrhage. 2. Subarachnoid hemorrhage in the sylvian fissures bilaterally. No hemorrhage in the basilar cisterns. Hemorrhage pattern is not typical for aneurysm rupture and may be due to trauma or coagulopathy. 3. 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 4. Atrophy with chronic microvascular ischemic change. Electronically Signed   By: Franchot Gallo M.D.   On: 03/24/2019 11:43   Ct Code Stroke Cta Neck W/wo Contrast  Result Date: 03/24/2019 CLINICAL DATA:  Intracranial hemorrhage. History of aneurysm clipping EXAM: CT ANGIOGRAPHY HEAD AND NECK TECHNIQUE: Multidetector CT imaging of the head and neck was performed using the standard protocol during bolus administration of intravenous contrast. Multiplanar CT image reconstructions and MIPs were obtained to evaluate the vascular anatomy. Carotid  stenosis measurements (when applicable) are obtained utilizing NASCET criteria, using the distal internal carotid diameter as the denominator. CONTRAST:  3mL OMNIPAQUE IOHEXOL 350 MG/ML SOLN COMPARISON:  MRI head 03/24/2019, CT head 05/23/2016 FINDINGS: CT HEAD FINDINGS Brain: Acute/subacute hemorrhage is present in the subarachnoid space bilaterally. Layering blood products in the occipital horns bilaterally. No significant hemorrhage in the basilar cisterns. Small subdural hemorrhages posteriorly are better seen on the MRI today. Moderate atrophy. Ventricular enlargement is present which has progressed since 2017 and may represent hydrocephalus or progression of atrophy. Chronic ischemic changes throughout the white matter and basal ganglia. No acute ischemic infarct. No mass lesion. Vascular: Clipping of right MCA aneurysm. Negative for hyperdense vessel Skull: Right frontal craniotomy for aneurysm clipping Sinuses: Negative Orbits: Bilateral cataract surgery. Review of the MIP images confirms the above findings CTA NECK FINDINGS Aortic arch: Mild atherosclerotic disease aortic arch without aneurysm or dissection. Proximal great vessels widely patent Right carotid system: Mild atherosclerotic disease right carotid bifurcation without stenosis Left carotid system: Atherosclerotic disease left carotid bifurcation. 20% diameter stenosis due to calcified and noncalcified plaque in the proximal left internal carotid artery. Vertebral arteries: Both vertebral arteries patent to the basilar without significant stenosis. Skeleton: Cervical spondylosis without acute skeletal abnormality. Other neck: Negative Upper chest: Mild airspace disease in the upper lobes bilaterally. Probable atelectasis. Review of the MIP images confirms the above findings CTA HEAD FINDINGS Anterior circulation: Atherosclerotic calcification in the cavernous carotid bilaterally. Moderate stenosis right supraclinoid  internal carotid artery and  mild stenosis left cavernous carotid. Anterior and middle cerebral arteries patent bilaterally without significant stenosis Aneurysm clip right MCA bifurcation without recurrent aneurysm. Posterior circulation: Both vertebral arteries patent to the basilar without stenosis. Right PICA patent. Left PICA not visualized. Left AICA patent. Superior cerebellar and posterior cerebral arteries patent bilaterally. Venous sinuses: Normal venous enhancement Anatomic variants: None Delayed phase: Not perform Review of the MIP images confirms the above findings IMPRESSION: 1. Intraventricular hemorrhage bilaterally with ventricular enlargement compared with 2017. This is likely due to hydrocephalus given the acute hemorrhage. 2. Subarachnoid hemorrhage in the sylvian fissures bilaterally. No hemorrhage in the basilar cisterns. Hemorrhage pattern is not typical for aneurysm rupture and may be due to trauma or coagulopathy. 3. 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 4. Atrophy with chronic microvascular ischemic change. Electronically Signed   By: Franchot Gallo M.D.   On: 03/24/2019 11:43   Mr Brain Wo Contrast  Result Date: 03/24/2019 CLINICAL DATA:  Dementia, vascular etiology suspected. Shortness of breath at nursing home. Fever. Altered mental state. EXAM: MRI HEAD WITHOUT CONTRAST TECHNIQUE: Multiplanar, multiecho pulse sequences of the brain and surrounding structures were obtained without intravenous contrast. COMPARISON:  CT head without contrast 05/23/2016 FINDINGS: Brain: T1 hyperintense subdural fluid is evident about the right parietal and occipital lobe. There is extension along the interhemispheric falx. There is some T1 hyperintense fluid posterior to the left occipital pole as well. This creates artifact on the diffusion-weighted images. Additional subarachnoid and petechial parenchymal hemorrhage is present in the left sylvian fissure and left frontal operculum. A smaller area  of focal susceptibility is present in the posterior right frontal operculum. Other areas of cortical/subarachnoid susceptibility are noted along the anterior cerebral falx bilaterally. Blood products layer within the lateral ventricles bilaterally. There is no hydrocephalus. Multiple punctate subcortical foci of susceptibility are evident bilaterally. Right MCA bifurcation aneurysm clip creates significant susceptibility artifact. Subarachnoid blood is present anterior to the brainstem, right greater than left. Remote lacunar infarcts are present in the basal ganglia bilaterally, right greater than left. Chronic ischemic changes are present in the thalami. White matter changes extend into the brainstem. Vascular: Flow is present in the major intracranial arteries. Skull and upper cervical spine: The craniocervical junction is normal. Degenerative changes are noted in the cervical spine at the C3-4 level. Sinuses/Orbits: Mild mucosal thickening is present in the inferior maxillary sinuses and scattered in the ethmoid air cells bilaterally. No fluid levels are present. Bilateral lens replacements are noted. Globes and orbits are otherwise unremarkable. IMPRESSION: 1. Acute/subacute subarachnoid and subdural hemorrhage is described. Subdural blood is most evident in the left sylvian fissure and prepontine space. 2. Subdural blood is most evident posteriorly, right greater than left. 3. Layering blood products within the lateral ventricles bilaterally without hydrocephalus. 4. No significant midline shift. 5. Petechial parenchymal hemorrhage is present in the left frontal operculum and to a lesser extent posterior right frontal operculum. 6. Additional subarachnoid blood and possible parenchymal petechial hemorrhage is noted in the anteromedial frontal lobes bilaterally along the falx. 7. Multiple punctate foci of susceptibility suggesting underlying vasculitis. This could be the source of hemorrhage. Aneurysmal bleed  is not excluded. 8. Right MCA aneurysm clip in place. 9. Susceptibility from the blood products without acute restricted diffusion, ischemic stroke. Critical Value/emergent results were called by telephone at the time of interpretation on 03/24/2019 at 7:02 am to Dr. Alfonse Spruce, who verbally acknowledged these results. Electronically Signed  By: San Morelle M.D.   On: 03/24/2019 07:02   Dg Chest Port 1 View  Result Date: 03/25/2019 CLINICAL DATA:  Respiratory failure, fever and cough EXAM: PORTABLE CHEST 1 VIEW COMPARISON:  03/22/2019 FINDINGS: Cardiac shadows within normal limits. The lungs are well aerated bilaterally. No focal infiltrate or sizable effusion is noted. No bony abnormality is seen. IMPRESSION: No active disease. Electronically Signed   By: Inez Catalina M.D.   On: 03/25/2019 08:04    PHYSICAL EXAM Elderly African-American male not in distress. . Afebrile. Head is nontraumatic. Neck is supple without bruit.    Cardiac exam no murmur or gallop. Lungs are clear to auscultation. Distal pulses are well felt. Neurological Exam :  Awake alert globally aphasic speaks only few words but not sentences.  Follows only occasional commands.  Blinks to threat bilaterally.  Extraocular movements show restriction of left gaze possibly left 6th nerve palsy.  Mild left lower facial weakness.  Tongue midline.  Motor system exam shows spastic left hemiplegia with non-fixed flexion contracture left wrist and fingers.  Tone is increased on the left side compared to the right.  Left upper extremity strength is 3/5 left lower extremity strength 4/5.  And appears to have normal strength on the right side.  Left plantar is upgoing right is downgoing.  Sensation appears to be preserved bilaterally.  Gait not tested.  ASSESSMENT/PLAN Mr. IMRAAN WENDELL is a 78 y.o. 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 Admitted 03/20/19 w/ sepsis d/t aspiration w/ metabolic encephalopathy. D/t extreme HTN, MRI was done which showed a acute/subacute SAH and SDH w/ petechial parenchymal hmg in L sylvian fissure and L frontal operculum, SAH B anterior cerebral falx and R > L anterior to brainstem. Family reports falls at [facility].  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 SDH in L sylvian fissure and prepontine space. SDH posterior R>L. Layering B IVH. No sign shift. Petechial hmg L frontal operculum, less posterior R frontal operculum. SAH and possible IPH anteromedial B frontal lobes along falx. Mult punctate foci suggesting underlying vasculitis. R MCA aneurysm clip in place.  CTA head & neck B IVH w/ hydrocephalus. B SAH sylvian fissure. R MCA aneurysmal surgical clip in place w/o recurrent aneurysm. Atrophy w/ SVD.  Check EEG pending   SCDs for VTE prophylaxis  clopidogrel 75 mg daily prior to admission, now on No antithrombotic given hmg  Therapy recommendations:  pending   Palliative care to meet with wife today r/t goals of care  Disposition:  pending   Hypertension  SBP goal 120-160 . Long-term BP goal normotensive  Hyperlipidemia  Home meds:  No statin  LDL not checked  Long-term goal < 70 given prior ischemic stroke/TIA  No statin at this time given hmg  Consider lipid panel baseline on plan of care and statin as appropriate  Diabetes type II   CBGs  SSI  Dysphagia  NPO  Other Stroke Risk Factors  Advanced age  Former Cigarette smoker  Hx stroke/TIA  06/2010 - R brain TIA  Reported hx - L thalamic infarct w/ residual LUE HP  Family hx stroke (mother)  Obstructive sleep apnea, on CPAP at home  Hx Congestive heart failure  Other Active Problems  Hx cerebral aneurysm  05/2008 w/o rupture s/p crani  and clipping w/ resultant L face, arm and leg numbness  Cognitive impairment  Hospital day # 5  I have personally obtained history,examined this patient, reviewed notes, independently viewed imaging studies, participated in medical decision making and plan of care.ROS completed by me personally and pertinent positives fully documented  I have made any additions or clarifications directly to the above note.  Patient presented with sepsis and dehydration altered mental status and brain imaging shows small chronic subdural hematomas as well as acute subarachnoid hemorrhage which may be traumatic given recent history of multiple falls.  CT angiogram was negative for any aneurysm but he does have remote history of right MCA aneurysm which has been surgically clipped.  Does have baseline vascular dementia which appears to be quite advanced.  I had a long discussion of the bedside with the patient's wife and daughter palliative care team nurse practitioner about patient's baseline poor prognosis dementia as well as gait regular swallowing difficulties are likely to get worse regimen for palliative care approach systolic goal below 726.  Speech therapy to check swallow eval. check EEG for seizure activity.  Discussed with Dr. Lynetta Mare critical care team This patient is critically ill and at significant risk of neurological worsening, death and care requires constant monitoring of vital signs, hemodynamics,respiratory and cardiac monitoring, extensive review of multiple databases, frequent neurological assessment, discussion with family, other specialists and medical decision making of high complexity.I have made any additions or clarifications directly to the above note.This critical care time does not reflect procedure time, or teaching time or supervisory time of PA/NP/Med Resident etc but could involve care discussion time.  I spent 30 minutes of neurocritical care time  in the care of  this patient.      Antony Contras, MD Medical Director [facility] Pager: [phone] 03/25/2019 4:41 PM   To contact Stroke Continuity provider, please refer to [email or web address] After hours, contact General Neurology

## 2019-03-25 NOTE — Progress Notes (Signed)
Patient ID: [name], male   DOB: Mar 25, 1941, 78 y.o.   MRN: [id number]  This NP visited patient at the bedside as a follow up to  yesterday's [place], for palliative medicine needs and emotional support.  As planned I met with the patient's wife and daughter/[name] and escorted them to the patient's room for a visit.  Family has not seen the patient for over 3 months.  It was important for them to lay eyes on the patient in order to process the situation and enhance their ability to make decisions related to plan of care.   We had a continued conversation regarding diagnosis, prognosis, goals of care, end-of-life wishes, disposition and options.  We discussed the concept of human mortality and failure to thrive, we discussed the limitations of medical interventions to prolong quality of life within the context of failure to thrive.  A detailed discussion was had today regarding advanced directives.  Concepts specific to code status, artifical feeding and hydration, continued IV antibiotics and rehospitalization was had.  The difference between a aggressive medical intervention path  and a palliative comfort care path for this patient at this time was had.  Values and goals of care important to patient and family were attempted to be elicited.  MOST form introduced and Hard Choices booklet is left for review.  Plan of Care: -Full code (family strongly encouraged to consider DNR/DNI status knowing poor outcomes in similar patients) -Speech therapy follow-up, hopefully patient will be able to tolerate a diet successfully. -Urology to evaluate suprapubic tube -Treatment plan for the next 24 hours as family process and gather to discuss plan of care -In morning at  0900 family will revisit and ongoing GOCs meeting to be had, they are leaning toward a less aggressive appraoch  Family understand the seriousness of the situation.  Dr Leonie Man updated family from a neurology standpoint at the  bedside.  Discussed with family  the importance of ensuring decisions are within the context of the patients values and GOCs.  Questions and concerns addressed   Discussed with bedside RN/Susan  Total time spent on the unit was 60 minutes  Greater than 50% of the time was spent in counseling and coordination of care  Wadie Lessen NP  Palliative Medicine Team Team Phone # [phone] Pager [phone]

## 2019-03-25 NOTE — Procedures (Signed)
History: 78 year old male being evaluated for encephalopathy in the setting of recent ICH  Sedation: None  Technique: This is a 21 channel routine scalp EEG performed at the bedside with bipolar and monopolar montages arranged in accordance to the international 10/20 system of electrode placement. One channel was dedicated to EKG recording.    Background: There is a posterior dominant rhythm of 7 to 8 Hz which is moderately well sustained.  In addition, there is generalized irregular delta and theta range activities intruding into the background throughout the waking state.  There is no epileptiform activity seen.  Photic stimulation: Physiologic driving is not performed  EEG Abnormalities: 1) generalized irregular slow activity 2) slow PDR  Clinical Interpretation: This EEG is consistent with a generalized nonspecific cerebral dysfunction (encephalopathy). There was no seizure or seizure predisposition recorded on this study. Please note that lack of epileptiform activity on EEG does not preclude the possibility of epilepsy.   Roland Rack, MD [facility] [phone]  If 7pm- 7am, please page neurology on call as listed in [place].

## 2019-03-25 NOTE — Progress Notes (Signed)
  Speech Language Pathology Treatment: Dysphagia  Patient Details Name: [name] MRN: [id number] DOB: 21-Mar-1941 Today's Date: 03/25/2019 Time: 8127-5170 SLP Time Calculation (min) (ACUTE ONLY): 25 min  Assessment / Plan / Recommendation Clinical Impression  Patient seen to address dysphagia goals with trials of thin liquids. Patient is more alert and attentive today as compared to earlier in the week and he was able to maintain alertness and active participation for duration of session without significant cues. SLP provided small, controlled straw sips of thin liquids (approximately 8 ounces total) and although patient did exhibit delayed swallow initiation and suspected discoordination of swallow, he did not exhibit any overt s/s of aspiration or penetration and voice remained clear throughout session. SLP spoke with patient's RN regarding swallow safety and reviewed recommendations from SLP who saw patient yesterday. Some family members were here today to see patient and speak with nursing and palliative care team and plan is for further discussion with more family tomorrow at 9am.    HPI HPI: 78yo male with PMH of hyperaldosteronism, HTN, T2DM, neurogenic bladder s/p suprapubic catheter, h/o CVA with left sided hemiparesis, vascular dementia presenting from [place] [facility] due to nonresolving pneumonia. CT head did not reveal any acute abnormality. MR Brain ordered on 6/8 but not completed at the time of this update to HPI.      SLP Plan  Continue with current plan of care       Recommendations  Diet recommendations: (clears-thin liquids via cup or small straw sips with floor stock only. ) Liquids provided via: Cup;Straw Medication Administration: Crushed with puree Supervision: Full supervision/cueing for compensatory strategies Compensations: Slow rate;Small sips/bites Postural Changes and/or Swallow Maneuvers: Seated upright 90 degrees;Upright 30-60 min after meal               Oral Care Recommendations: Oral care QID Follow up Recommendations: Other (comment)(TBD ) SLP Visit Diagnosis: Dysphagia, oropharyngeal phase (R13.12) Plan: Continue with current plan of care       GO                [name] 03/25/2019, 5:06 PM   Sonia Baller, MA, [facility] Pager: [phone]

## 2019-03-25 NOTE — Evaluation (Signed)
Physical Therapy Evaluation Patient Details Name: [name] MRN: [id number] DOB: 10/31/1940 Today's Date: 03/25/2019   History of Present Illness   [name] is a 78yo male with PMH of hyperaldosteronism, HTN, T2DM, neurogenic bladder s/p suprapubic catheter, h/o CVA with left sided hemiparesis, vascular dementia presenting from [place] [facility] due to nonresolving pneumonia. Pt w/c bound and dependent for all ADLs.  Clinical Impression  Pt admitted with above. Pt was w/c bound and dependent for all ADLs PTA. Per chart pt was verbal PTA however pt non-verbal today. Pt did follow simple commands 50% of time. Pt sat EOB with maxA x6 min for bath. Pt safe to return to SNF once medically stable. Acute PT to cont to follow.    Follow Up Recommendations SNF;Supervision/Assistance - 24 hour    Equipment Recommendations  None recommended by PT    Recommendations for Other Services       Precautions / Restrictions Precautions Precautions: Fall Precaution Comments: L hemiparesis Restrictions Weight Bearing Restrictions: No      Mobility  Bed Mobility Overal bed mobility: Needs Assistance Bed Mobility: Rolling;Sidelying to Sit;Sit to Supine Rolling: Total assist;+2 for physical assistance Sidelying to sit: Total assist;+2 for physical assistance   Sit to supine: Total assist;+2 for physical assistance   General bed mobility comments: pt with no initiation of task  Transfers                 General transfer comment: pt w/c bound  Ambulation/Gait             General Gait Details: pt non-ambulatory  Stairs            Wheelchair Mobility    Modified Rankin (Stroke Patients Only) Modified Rankin (Stroke Patients Only) Pre-Morbid Rankin Score: Severe disability Modified Rankin: Severe disability     Balance Overall balance assessment: Needs assistance Sitting-balance support: Feet supported;Single extremity supported Sitting balance-Leahy  Scale: Poor Sitting balance - Comments: pt with strong anterior lean, dependent to maintain EOB balance                                     Pertinent Vitals/Pain Pain Assessment: Faces Faces Pain Scale: Hurts even more Pain Location: L arm with movement Pain Descriptors / Indicators: Grimacing Pain Intervention(s): Monitored during session    Home Living Family/patient expects to be discharged to:: Skilled nursing facility                 Additional Comments: from [place] [facility]    Prior Function Level of Independence: Needs assistance   Gait / Transfers Assistance Needed: pt w/c bound  ADL's / Homemaking Assistance Needed: dependent for all ADLs        Hand Dominance   Dominant Hand: Right    Extremity/Trunk Assessment   Upper Extremity Assessment Upper Extremity Assessment: LUE deficits/detail LUE Deficits / Details: pt with flexion contracture, no active movement LUE Sensation: (hypersensitivty to touch)    Lower Extremity Assessment Lower Extremity Assessment: Generalized weakness(able to initiate LAQ at EOB bilat LE otherwise no active mvt)    Cervical / Trunk Assessment Cervical / Trunk Assessment: Kyphotic(weak neck extensors)  Communication   Communication: Expressive difficulties(pt non-verbal today, per chart pt is verbal)  Cognition Arousal/Alertness: Awake/alert Behavior During Therapy: WFL for tasks assessed/performed Overall Cognitive Status: History of cognitive impairments - at baseline  General Comments: pt with advanced dementia      General Comments General comments (skin integrity, edema, etc.): pt with skin break down on bottom, RN aware, pt dependent for hygiene s/p bowel incontinence    Exercises     Assessment/Plan    PT Assessment Patient needs continued PT services  PT Problem List         PT Treatment Interventions Functional mobility training;Therapeutic  activities;Therapeutic exercise;Balance training    PT Goals (Current goals can be found in the Care Plan section)  Acute Rehab PT Goals PT Goal Formulation: Patient unable to participate in goal setting Time For Goal Achievement: 04/08/19 Potential to Achieve Goals: Poor    Frequency Min 2X/week   Barriers to discharge        Co-evaluation               AM-PAC PT "6 Clicks" Mobility  Outcome Measure Help needed turning from your back to your side while in a flat bed without using bedrails?: Total Help needed moving from lying on your back to sitting on the side of a flat bed without using bedrails?: Total Help needed moving to and from a bed to a chair (including a wheelchair)?: Total Help needed standing up from a chair using your arms (e.g., wheelchair or bedside chair)?: Total Help needed to walk in hospital room?: Total Help needed climbing 3-5 steps with a railing? : Total 6 Click Score: 6    End of Session   Activity Tolerance: Patient tolerated treatment well Patient left: in bed;with call bell/phone within reach;with bed alarm set Nurse Communication: Mobility status PT Visit Diagnosis: Hemiplegia and hemiparesis;Muscle weakness (generalized) (M62.81) Hemiplegia - Right/Left: Left Hemiplegia - dominant/non-dominant: Non-dominant Hemiplegia - caused by: Cerebral infarction    Time: 1228-1252 PT Time Calculation (min) (ACUTE ONLY): 24 min   Charges:   PT Evaluation $PT Eval Moderate Complexity: 1 Mod PT Treatments $Therapeutic Activity: 8-22 mins        Kittie Plater, PT, DPT Acute Rehabilitation Services Pager #: [phone] Office #: [phone]   Berline Lopes 03/25/2019, 1:35 PM

## 2019-03-25 NOTE — Consult Note (Signed)
[facility] Nurse wound consult note Reason for Consult: bilateral buttock lesions, PI vs MASD Wound type:Moisture plus friction and pressure  Stage 2 PI Pressure Injury POA: Yes Measurement: two areas measuring 2cm x 2.5cm x 0.1cm.  Areas of maceration and partial thickness tissue loss secondary to fecal incontinence and urinary incontinence Wound bed: pink, moist Drainage (amount, consistency, odor) scant serous Periwound:macerated Dressing procedure/placement/frequency: Patient is on a mattress replacement with low air loss feature, is being turned and repositioned.  I will add Gerhart's Butt cream for the MASD and Stage 2 PI, a compounded 1:1:1 hydrocortisone/zinc oxide/lotrimin preparation. I will provide pressure redistribution heel boots and a chair cushion for downstream use.  [place] nursing team will not follow, but will remain available to this patient, the nursing and medical teams.  Please re-consult if needed. Thanks, Maudie Flakes, MSN, RN, [place], Arther Abbott  Pager# [phone]

## 2019-03-25 NOTE — Progress Notes (Signed)
Patient ID: [name], male   DOB: 04-Sep-1941, 78 y.o.   MRN: [id number] Vital signs are stable Patient's level of consciousness is improved somewhat this morning Nurses note that it is variable during the shift where he is alert and talkative versus other times when he is difficult to arouse and noncommunicative.  Left hemiparesis remains significant.  I will sign off at this time as I do not see a neurosurgical lesion evolving or present.  Continues medical treatment.  Please call us if we can be of further assistance

## 2019-03-25 NOTE — Progress Notes (Addendum)
Nutrition Follow-up   RD working remotely.  DOCUMENTATION CODES:   Not applicable  INTERVENTION:  Diet advancement pending SLP evaluation.   If unable to advance diet,  Recommend enteral nutrition using Jevity 1.2 @ 25 ml/hr and increase by 10 ml every 4 hours to goal rate of 65 ml/hr.   30 ml Prostat daily.    Tube feeding regimen provides 1972 kcal (100% of needs), 101 grams of protein, and 1264 ml of H2O.   Cortrak tube placement team only available on Mondays and Fridays. IR may place Cortrak NGT prior to Friday.   NUTRITION DIAGNOSIS:   Inadequate oral intake related to inability to eat as evidenced by NPO status; ongoing  GOAL:   Patient will meet greater than or equal to 90% of their needs; not met  MONITOR:   Diet advancement, Labs, Weight trends, Skin, I & O's  REASON FOR ASSESSMENT:   Low Braden    ASSESSMENT:   Mr. Siever is a 78yo male with hyperaldosteronism, HTN, T2DM, neurogenic bladder s/p suprapubic catheter, h/o CVA with left sided hemiparesis,andvascular dementiawho is being treated for aspiration pneumonia. Pt admitted with sepsis secondary to aspiration pneumonia. Developed worsening mental status 6/8 with subsequent MRI showing acute / subacute subarachnoid & subdural hemorrhage.   Per RN, TF initiation on hold for today. No NGT in place. Per Palliative care discussion with family today, family desires full code. Plans for repeat SLP swallow evaluation today prior to decision regarding enteral nutrition. Family hopeful for successful diet advancement. Tube feeding recommendations stated above if diet unable to be advanced. Noted Cortrak tube placement team only available on Mondays and Fridays. IR may place Cortrak NGT prior to Friday.   Labs and medications reviewed. Cleviprex at 18 ml/hr.  Diet Order:   Diet Order            Diet NPO time specified Except for: Sips with Meds  Diet effective now              EDUCATION NEEDS:    Not appropriate for education at this time  Skin:  Skin Assessment: Skin Integrity Issues: Skin Integrity Issues:: Other (Comment) Other: non pressure wound to L buttocks  Last BM:  Unknown  Height:   Ht Readings from Last 1 Encounters:  03/24/19 5' 7" (1.702 m)    Weight:   Wt Readings from Last 1 Encounters:  03/25/19 77.1 kg    Ideal Body Weight:  67.3 kg  BMI:  Body mass index is 26.62 kg/m.  Estimated Nutritional Needs:   Kcal:  1850-2050  Protein:  95-110 grams  Fluid:  > 1.8 L    Corrin Parker, MS, RD, LDN Pager # [phone] After hours/ weekend pager # [phone]

## 2019-03-25 NOTE — Progress Notes (Signed)
EEG complete - results pending 

## 2019-03-26 DIAGNOSIS — R627 Adult failure to thrive: Secondary | ICD-10-CM

## 2019-03-26 DIAGNOSIS — Z66 Do not resuscitate: Secondary | ICD-10-CM

## 2019-03-26 LAB — GLUCOSE, CAPILLARY
Glucose-Capillary: 141 mg/dL — ABNORMAL HIGH (ref 70–99)
Glucose-Capillary: 145 mg/dL — ABNORMAL HIGH (ref 70–99)
Glucose-Capillary: 136 mg/dL — ABNORMAL HIGH (ref 70–99)
Glucose-Capillary: 117 mg/dL — ABNORMAL HIGH (ref 70–99)
Glucose-Capillary: 217 mg/dL — ABNORMAL HIGH (ref 70–99)
Glucose-Capillary: 217 mg/dL — ABNORMAL HIGH (ref 70–99)

## 2019-03-26 LAB — TRIGLYCERIDES: Triglycerides: 290 mg/dL — ABNORMAL HIGH (ref <150)

## 2019-03-26 NOTE — Consult Note (Signed)
NAME:  [name], MRN:  [id number], DOB:  07/28/1941, LOS: 6 ADMISSION DATE:  03/20/2019, CONSULTATION DATE:  03/24/19 REFERRING MD:  Dr. [name], CHIEF COMPLAINT:  SDH / [place]    Brief History   78 y/o M admitted 6/5 with concern for possible PNA.  Developed worsening mental status 6/8 with subsequent MRI showing acute / subacute subarachnoid & subdural hemorrhage.   History of present illness   78 y/o M who was admitted on 6/5 from a SNF with hypertension, fever, tachypnea, hypoxemia and concern for RLL PNA based on CXR. He was sent to [facility] for evaluation 6/5 and admitted with concern for sepsis secondary to aspiration PNA.  Initial work up notable for hypernatremia, AKI, leukocytosis and mild anemia. The patient was admitted for further evaluation per IMTS.  He was treated with IV antibiotics for HCAP.  On the am of 6/8, he was noted to be altered initially thought related to sepsis.  BP was also significantly elevated and and MRI was ordered.  MRI showed an acute / subacute subarachnoid & subdural hemorrhage.  The patient was evaluated by Neurology and transferred to ICU.    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 Subdural blood most evident in the left sylvian fissure & prepontine space.  Subdural blood most evident posteriorly R>L.  Layering blood products within the lateral ventricles bilaterally w/o hydrocephalus, additional subarachnoid blood and possible parenchymal petechial hemorrhage int he anteromedial frontal lobes  bilaterally, R MCA aneurysm clip in place CTA Head / Neck 6/9 >> intraventricular hemorrhage bilaterally with ventricular enlargement, likely due to acute hemorrhage, SAH in the sylvian fissures bilaterally, pattern not typical of aneurysm fupture, surgical clipping of R MCA aneurysm, atrophy with chronic microvascular ischemic changes   Micro Data:  BCx2 6/5 >> 1/2 diptheroids  BCID 6/5 >> negative   Antimicrobials:  Unasyn 6/5 >> 6/6 Azithro 6/7 >> 6/9  Zosyn 6/5 >> 6/9   Interim history/subjective:  Calm but disoriented. Limited verbal responses to questioning.  Objective   Blood pressure (!) 158/67, pulse 79, temperature 99.5 F (37.5 C), temperature source Axillary, resp. rate 16, height 5\' 7"  (1.702 m), weight 79.9 kg, SpO2 94 %.        Intake/Output Summary (Last 24 hours) at 03/26/2019 1035 Last data filed at 03/26/2019 0600 Gross per 24 hour  Intake 382.85 ml  Output 1025 ml  Net -642.15 ml   Filed Weights   03/24/19 1430 03/25/19 0500 03/26/19 0500  Weight: 77 kg 77.1 kg 79.9 kg    Examination: General: chronically ill appearing male in NAD HEENT: MM pink/moist, pupils 2-3 mm reactive Neuro: Awakens to voice, follows simple commands on right, flicker on left with painful stimuli  CV: s1s2 rrr, SR with frequent PVC's PULM: even/non-labored, lungs bilaterally clear  GI: soft, non-tender, bsx4 hyperactive, suprapubic catheter in place  Extremities: warm/dry, no edema  Skin: no rashes or lesions  Resolved Hospital Problem list      Assessment &  Plan:   Subarachnoid hemorrhage with intraventricular extension Subdural hemorrhage  Hypertensive Crisis - suspected traumatic etiology, ? Fall PTA - SBP goal 140-182mmHg - Clevidipine infusion for BP goal - Hold antiplatelet agents - Transition to enteral anti-HTN medications.  HCAP vs Aspiration (SNF resident) - Stop antibiotics as no radiographic or microbiologic evidence to suggest pneumonia.   DM2 - CBG  monitoring and SSI  Dementia  CVA history with residual Left hemiparesis. Unable to perform ADLs or even move his wheelchair.  - Supportive care - Palliative care has been consulted by primary team.  - continue aricept, namenda, zoloft for now  HTN HLD CHF  - Cleviprex gtt as above  Suprapubic Catheter  -will change out today -continue myrbetriq   OSA  - not on CPAP  - consider CPAP once mental status improves   Best practice:  Diet: NPO pending swallow evaluation.  Pain/Anxiety/Delirium protocol (if indicated): NA VAP protocol (if indicated): NA DVT prophylaxis: SCD GI prophylaxis: PPI Glucose control: SSI Mobility: BR Code Status: FULL Family Communication: Wife ([name]) updated on patients status in detail 6/9. Introduced concept of CPR / short term ventilation.  Recommended no CPR in the event of cardiac arrest due to underlying comorbid conditions.  Consider short term ventilation if Neurology feels appropriate and patient were to need. Family would like to discuss and think about his plan.  Difficult for them to make decisions as they have not seen him since March due to [place].   Dysphagia and feeding tube placement should factor into decision making. Disposition: transfer once off Cleveprex infusion.   Labs   CBC: Recent Labs  Lab 03/20/19 0827 03/21/19 0600 03/22/19 0659 03/23/19 0803 03/24/19 0723 03/25/19 0524  WBC 14.8* 14.6* 13.4* 11.3* 13.0* 13.3*  NEUTROABS 12.4*  --  10.9* 8.6* 10.5*  --   HGB 12.4* 11.6* 12.2* 11.1* 11.4* 11.3*  HCT 42.7 39.4 40.7 37.6* 39.2 37.7*  MCV 86.3 84.0 83.4 84.1 85.6 83.6  PLT 272 263 282 270 262 010    Basic Metabolic Panel: Recent Labs  Lab 03/22/19 0659 03/22/19 1859 03/23/19 0803 03/24/19 0723 03/25/19 0524  NA 150* 143 144 146* 143  K 3.4* 3.1* 3.3* 3.4* 3.3*  CL 113* 107 108 107 105  CO2 29 28 28 30 26   GLUCOSE 122* 197* 134* 100* 161*  BUN 23 21 17 16 15   CREATININE 1.22 1.28* 1.16 1.32* 1.05   CALCIUM 9.4 8.9 8.9 9.2 9.2  MG  --   --   --   --  2.0  PHOS  --   --   --   --  2.3*   GFR: Estimated Creatinine Clearance: 61.6 mL/min (by C-G formula based on SCr of 1.05 mg/dL). Recent Labs  Lab 03/20/19 0827  03/22/19 0659 03/23/19 0803 03/24/19 0723 03/25/19 0524  WBC 14.8*   < > 13.4* 11.3* 13.0* 13.3*  LATICACIDVEN 1.7  --   --   --   --   --    < > = values in this interval not displayed.    Liver Function Tests: Recent Labs  Lab 03/20/19 0827  AST 13*  ALT 10  ALKPHOS 70  BILITOT 0.7  PROT 6.6  ALBUMIN 3.0*   No results for input(s): LIPASE, AMYLASE in the last 168 hours. No results for input(s): AMMONIA in the last 168 hours.  ABG    Component Value Date/Time   TCO2 26 08/29/2016 0014     Coagulation Profile: Recent Labs  Lab 03/24/19 2143  INR 1.2    Cardiac Enzymes: No results for input(s): CKTOTAL, CKMB, CKMBINDEX, TROPONINI in the last 168 hours.  HbA1C: Hemoglobin A1C  Date/Time Value Ref Range Status  06/07/2018 7.2  Final  02/04/2018 7.0  Final    CBG: Recent Labs  Lab 03/25/19 1651 03/25/19 2034 03/25/19 2321 03/26/19 0333 03/26/19 0741  GLUCAP 130* 98 129* 141* 145*   Kipp Brood, MD [facility] ICU Physician [place]  Pager: [phone] Mobile: [phone] After hours: [phone].  03/26/2019, 10:35 AM       03/26/2019, 10:35 AM

## 2019-03-26 NOTE — Progress Notes (Signed)
STROKE TEAM PROGRESS NOTE   INTERVAL HISTORY Patient remains neurologically stable.  Blood pressure adequately controlled.  EEG shows generalized mild slowing but no definite epileptiform activity.  Patient `a sisters visited during am rounds and I  met with them discussed patient's status and answered questions. Vitals:   03/26/19 0915 03/26/19 0930 03/26/19 1200 03/26/19 1300  BP: (!) 152/69 (!) 158/67 (!) 160/77 (!) 185/91  Pulse: 74 79  73  Resp: _0 Temp:   99.1 F (37.3 C)   TempSrc:   Axillary   SpO2: 97% 94%  97%  Weight:      Height:        CBC:  Recent Labs  Lab 03/23/19 0803 03/24/19 0723 03/25/19 0524  WBC 11.3* 13.0* 13.3*  NEUTROABS 8.6* 10.5*  --   HGB 11.1* 11.4* 11.3*  HCT 37.6* 39.2 37.7*  MCV 84.1 85.6 83.6  PLT 270 262 778    Basic Metabolic Panel:  Recent Labs  Lab 03/24/19 0723 03/25/19 0524  NA 146* 143  K 3.4* 3.3*  CL 107 105  CO2 30 26  GLUCOSE 100* 161*  BUN 16 15  CREATININE 1.32* 1.05  CALCIUM 9.2 9.2  MG  --  2.0  PHOS  --  2.3*   Lipid Panel:     Component Value Date/Time   CHOL 153 06/07/2018   TRIG 290 (H) 03/26/2019 0424   TRIG 133 03/28/2007 1347   HDL 39 06/07/2018   CHOLHDL 2.2 06/08/2014 1622   VLDL 9 06/08/2014 1622   LDLCALC 95 06/07/2018   HgbA1c:  Lab Results  Component Value Date   HGBA1C 7.2 06/07/2018   Urine Drug Screen:     Component Value Date/Time   LABOPIA NONE DETECTED 08/06/2012 1900   COCAINSCRNUR NONE DETECTED 08/06/2012 1900   COCAINSCRNUR NEGATIVE 03/11/2009 0532   LABBENZ NONE DETECTED 08/06/2012 1900   LABBENZ NEGATIVE 03/11/2009 0532   AMPHETMU NONE DETECTED 08/06/2012 1900   THCU NONE DETECTED 08/06/2012 1900   LABBARB NONE DETECTED 08/06/2012 1900    Alcohol Level     Component Value Date/Time   ETH <11 08/06/2012 1705    IMAGING Dg Chest Port 1 View  Result Date: 03/25/2019 CLINICAL DATA:  Respiratory failure, fever and cough EXAM: PORTABLE CHEST 1 VIEW COMPARISON:   03/22/2019 FINDINGS: Cardiac shadows within normal limits. The lungs are well aerated bilaterally. No focal infiltrate or sizable effusion is noted. No bony abnormality is seen. IMPRESSION: No active disease. Electronically Signed   By: Inez Catalina M.D.   On: 03/25/2019 08:04    PHYSICAL EXAM Elderly African-American male not in distress. . Afebrile. Head is nontraumatic. Neck is supple without bruit.    Cardiac exam no murmur or gallop. Lungs are clear to auscultation. Distal pulses are well felt. Neurological Exam :  Awake alert globally aphasic speaks only few words but not sentences.  Follows only occasional commands.  Blinks to threat bilaterally.  Extraocular movements show restriction of left gaze possibly left 6th nerve palsy.  Mild left lower facial weakness.  Tongue midline.  Motor system exam shows spastic left hemiplegia with non-fixed flexion contracture left wrist and fingers.  Tone is increased on the left side compared to the right.  Left upper extremity strength is 3/5 left lower extremity strength 4/5.  And appears to have normal strength on the right side.  Left plantar is upgoing right is downgoing.  Sensation appears to be preserved bilaterally.  Gait not tested.  ASSESSMENT/PLAN [name] is a 78 y.o. 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 Admitted 03/20/19 w/ sepsis d/t aspiration w/ metabolic encephalopathy. D/t extreme HTN, MRI was done which showed a acute/subacute SAH and SDH w/ petechial parenchymal hmg in L sylvian fissure and L frontal operculum, SAH B anterior cerebral falx and R > L anterior to brainstem. Family reports falls at [facility].  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 SDH in L  sylvian fissure and prepontine space. SDH posterior R>L. Layering B IVH. No sign shift. Petechial hmg L frontal operculum, less posterior R frontal operculum. SAH and possible IPH anteromedial B frontal lobes along falx. Mult punctate foci suggesting underlying vasculitis. R MCA aneurysm clip in place.  CTA head & neck B IVH w/ hydrocephalus. B SAH sylvian fissure. R MCA aneurysmal surgical clip in place w/o recurrent aneurysm. Atrophy w/ SVD.  EEG mild generalized slowing.  No definite epileptiform activity.  SCDs for VTE prophylaxis  clopidogrel 75 mg daily prior to admission, now on No antithrombotic given hmg  Therapy recommendations: SNF   palliative care to meet with wife today r/t goals of care  Disposition:  pending   Hypertension  SBP goal 120-160 . Long-term BP goal normotensive  Hyperlipidemia  Home meds:  No statin  LDL not checked  Long-term goal < 70 given prior ischemic stroke/TIA  No statin at this time given hmg  Consider lipid panel baseline on plan of care and statin as appropriate  Diabetes type II   CBGs  SSI  Dysphagia  NPO  Other Stroke Risk Factors  Advanced age  Former Cigarette smoker  Hx stroke/TIA  06/2010 - R brain TIA  Reported hx - L thalamic infarct w/ residual LUE HP  Family hx stroke (mother)  Obstructive sleep apnea, on CPAP at home  Hx Congestive heart failure  Other Active Problems  Hx cerebral aneurysm 05/2008 w/o rupture s/p crani and clipping w/ resultant L face, arm and leg numbness  Cognitive impairment  Hospital day # 6  .  Patient presented with sepsis and dehydration altered mental status and brain imaging shows small chronic subdural hematomas as well as acute subarachnoid hemorrhage which may be traumatic given recent history of multiple falls.  CT angiogram was negative for any aneurysm but he does have remote history of right MCA aneurysm which has been surgically clipped.  Does have baseline vascular  dementia which appears to be quite advanced.  I had a long discussion of the bedside with the patient's 2 sisters and  palliative care team nurse practitioner about patient's baseline poor prognosis dementia as well as gait regular swallowing difficulties are likely to get worse and to consider   palliative care approach  , Mobilize out of bed.  Therapy consults.  Transfer to facility.  Discussed with Dr. Lynetta Mare critical care team  I have spent a total of  25 minutes with the patient reviewing hospital notes,  test results, labs and examining the patient as well as establishing an assessment and plan that was discussed personally with the patient.  > 50% of time was spent in direct patient care.         Henry Contras, MD Medical Director [facility] Pager: [phone] 03/26/2019 1:54  PM   To contact Stroke Continuity provider, please refer to [email or web address] After hours, contact General Neurology

## 2019-03-26 NOTE — Progress Notes (Addendum)
eLink Physician-Brief Progress Note Patient Name: [name] DOB: 1940-11-14 MRN: [id number]   Date of Service  03/26/2019  HPI/Events of Note  Diarrhea - Frequent loose BM's with skin breakdown. Request for Flexiseal.   eICU Interventions  Will order: 1. Place Flexiseal.      Intervention Category Major Interventions: Other:  [name] 03/26/2019, 12:12 AM

## 2019-03-26 NOTE — Progress Notes (Signed)
Nutrition Follow-up   RD working remotely.  DOCUMENTATION CODES:   Not applicable  INTERVENTION:   Magic cup TID with meals, each supplement provides 290 kcal and 9 grams of protein  NUTRITION DIAGNOSIS:   Inadequate oral intake related to inability to eat as evidenced by NPO status; ongoing Ongoing.   GOAL:   Patient will meet greater than or equal to 90% of their needs; not met Progressing.   MONITOR:   Diet advancement, Labs, Weight trends, Skin, I & O's  REASON FOR ASSESSMENT:   Low Braden    ASSESSMENT:   [name] is a 78yo male with hyperaldosteronism, HTN, T2DM, neurogenic bladder s/p suprapubic catheter, h/o CVA with left sided hemiparesis,andvascular dementiawho is being treated for aspiration pneumonia.  Pt admitted with sepsis secondary to aspiration pneumonia. Developed worsening mental status 6/8 with subsequent MRI showing acute / subacute subarachnoid & subdural hemorrhage.   Per record review family does not want to pursue a PEG. SLP evaluated today and started dysphagia 1 with thin liquids.   Labs and medications reviewed. Cleviprex at 13 ml/hr, miralax  Diet Order:   Diet Order            DIET - DYS 1 Room service appropriate? Yes; Fluid consistency: Thin  Diet effective now              EDUCATION NEEDS:   Not appropriate for education at this time  Skin:  Skin Assessment: Skin Integrity Issues: Skin Integrity Issues:: Other (Comment) Other: non pressure wound to L buttocks  Last BM:  6/11 large type 7 BM today via rectal tube  Height:   Ht Readings from Last 1 Encounters:  03/24/19 '5\' 7"'  (1.702 m)    Weight:   Wt Readings from Last 1 Encounters:  03/26/19 79.9 kg    Ideal Body Weight:  67.3 kg  BMI:  Body mass index is 27.59 kg/m.  Estimated Nutritional Needs:   Kcal:  1850-2050  Protein:  95-110 grams  Fluid:  > 1.8 L  Maylon Peppers RD, LDN, CNSC [phone] Pager [phone] After Hours Pager

## 2019-03-26 NOTE — Progress Notes (Signed)
Patient ID: [name], male   DOB: 06-19-1941, 78 y.o.   MRN: [id number]  This NP visited patient at the bedside as a follow up for palliative medicine needs and emotional support, and to re-meet with family for ongoing discussion regarding GOCs.   I met with the patient's wife and daughter/[name], and the patient's two sisters.  We had a continued conversation regarding diagnosis, prognosis, goals of care, end-of-life wishes, disposition and options.  We discussed the concept of human mortality and failure to thrive, we discussed the limitations of medical interventions to prolong quality of life within the context of failure to thrive.  Again a  detailed discussion was had  regarding advanced directives.  Concepts specific to code status, artifical feeding and hydration, continued IV antibiotics and rehospitalization was had.  The difference between a aggressive medical intervention path  and a palliative comfort care path for this patient at this time was had.  Values and goals of care important to patient and family were attempted to be elicited.  Created space and opportunity for family to explore their thoughts and feelings regarding current medical situation.  They all shared stories about [name], his hard work ethic, his ability to "fix anything", and the importance of his personal appearance and dignity.   All agree that they do not want him to suffer.  They speak to his continue physical, functional and cognitive decline over the past few years. They are shifting to a less aggressive treatment plan.  Hope is for comfort and dignity.    MOST form completed.           A Hard Choices booklet is left for review.  Plan of Care: - DNR/DNI -no artifical feeding or hydration now or in the future, speech will  make diet recommendation, family understands the risk of aspiration, and the possibility that he will not be able to support himself nutrionally long term - continue mediations  as tolerlated by mouth to treat chronic illnesses; ie BP meds - Focus of care is comfort and dignity; avoid rehospitalization, no further diagnostics/labs, no further scans or x-rays -Urology to evaluate suprapubic tube -Transition patient to [place] floor for ongoing observation.  Transition of care will be dependent on outcomes;      SNF with hospice versus residential hospice.  Residential hospice is preferred (Prognosis will be dependent on patient's ability to tolerate oral intake) -Evaluate patient in 24 to 48 hours to make decision regarding transition of care  Family understand the seriousness of the situation.  Dr Leonie Man updated family from a neurology standpoint at the bedside again today)  I shared with the family and the attending that I would not be back in the hospital again until Monday.  However a provider from the palliative medicine team will follow patient through the weekend for needs.  Family request daily updates   Questions and concerns addressed   Discussed with bedside RN/Brooke and Dr Lynetta Mare  Total time spent on the unit was 60 minutes  Greater than 50% of the time was spent in counseling and coordination of care  Wadie Lessen NP  Palliative Medicine Team Team Phone # [phone] Pager [phone]

## 2019-03-26 NOTE — Evaluation (Signed)
Occupational Therapy Evaluation Patient Details Name: [name] MRN: [id number] DOB: August 09, 1941 Today's Date: 03/26/2019    History of Present Illness  [name] is a 78yo male with PMH of hyperaldosteronism, HTN, T2DM, neurogenic bladder s/p suprapubic catheter, h/o CVA with left sided hemiparesis, vascular dementia presenting from [place] [facility] due to nonresolving pneumonia. [name] w/c bound and dependent for all ADLs.   Clinical Impression   [name] admitted with above. He demonstrates the below listed deficits and will benefit from continued OT to maximize safety and independence with BADLs.  [name] currently lethargic and minimally responsive.  He will briefly open eyes, and vocalized one time to state he was cold.  He does not follow commands, and was unable to assist with simple grooming when hand over hand assist provided.  Per chart review, [name] is a long term care resident of SNF, was w/c bound, and dependent with all ADLs except possibly self feeding.   Will see [name] for a trial of OT with focus on feeding goals to see if [name] able to benefit and progress.  Recommend Return to LTC.        Follow Up Recommendations  SNF    Equipment Recommendations  None recommended by OT    Recommendations for Other Services       Precautions / Restrictions Precautions Precautions: Fall Precaution Comments: L hemiparesis      Mobility Bed Mobility Overal bed mobility: Needs Assistance Bed Mobility: Rolling Rolling: Total assist         General bed mobility comments: unable to assist   Transfers                 General transfer comment: [name] w/c bound PTA.  Unable to safely attempt     Balance                                           ADL either performed or assessed with clinical judgement   ADL Overall ADL's : Needs assistance/impaired Eating/Feeding: Total assistance;Bed level   Grooming: Wash/dry face;Total assistance;Bed level Grooming Details  (indicate cue type and reason): attempted hand over hand assist to wash face.  [name] resists movement  Upper Body Bathing: Total assistance;Bed level   Lower Body Bathing: Total assistance;Bed level   Upper Body Dressing : Total assistance;Bed level   Lower Body Dressing: Total assistance;Bed level   Toilet Transfer: Total assistance   Toileting- Clothing Manipulation and Hygiene: Total assistance;Bed level       Functional mobility during ADLs: Total assistance       Vision   Additional Comments: [name] keeps eyes closed.  Will briefly open them to position change      Perception Perception Perception Tested?: No   Praxis      Pertinent Vitals/Pain Pain Assessment: Faces Faces Pain Scale: No hurt     Hand Dominance Right   Extremity/Trunk Assessment Upper Extremity Assessment Upper Extremity Assessment: LUE deficits/detail;RUE deficits/detail RUE Deficits / Details: [name] with minimal active movement.  Rigidity noted with active resistance with attemtps at hand over hand assist for grooming  RUE Coordination: decreased gross motor;decreased fine motor LUE Deficits / Details: [name] with flexion and ulnar deviation contractures of Lt wrist.  No active movement noted Lt UE LUE Coordination: decreased fine motor;decreased gross motor   Lower Extremity Assessment Lower Extremity Assessment: Defer to [name] evaluation  Cervical / Trunk Assessment Cervical / Trunk Assessment: Kyphotic   Communication Communication Communication: Other (comment)(difficult to assess )   Cognition Arousal/Alertness: Lethargic Behavior During Therapy: Flat affect Overall Cognitive Status: Impaired/Different from baseline Area of Impairment: Attention                   Current Attention Level: Focused           General Comments: [name] with h/o advanced dementia.  He followed no commnands.  He vocalized one time to state he was cold    General Comments       Exercises     Shoulder  Instructions      Home Living Family/patient expects to be discharged to:: Skilled nursing facility                                 Additional Comments: from [place]      Prior Functioning/Environment Level of Independence: Needs assistance  Gait / Transfers Assistance Needed: [name] w/c bound ADL's / Homemaking Assistance Needed: dependent for all ADLs except possibly self feeding             OT Problem List: Decreased strength;Decreased activity tolerance;Impaired balance (sitting and/or standing);Impaired vision/perception;Decreased coordination;Decreased cognition;Decreased safety awareness;Impaired tone;Impaired UE functional use      OT Treatment/Interventions: Self-care/ADL training;Neuromuscular education;DME and/or AE instruction;Therapeutic activities;Patient/family education    OT Goals(Current goals can be found in the care plan section) Acute Rehab OT Goals OT Goal Formulation: Patient unable to participate in goal setting Time For Goal Achievement: 04/09/19 Potential to Achieve Goals: Fair ADL Goals [name] Will Perform Eating: with mod assist;sitting;with adaptive utensils  OT Frequency: Min 2X/week(trial )   Barriers to D/C: Decreased caregiver support          Co-evaluation              AM-PAC OT "6 Clicks" Daily Activity     Outcome Measure Help from another person eating meals?: Total Help from another person taking care of personal grooming?: Total Help from another person toileting, which includes using toliet, bedpan, or urinal?: Total Help from another person bathing (including washing, rinsing, drying)?: Total Help from another person to put on and taking off regular upper body clothing?: Total Help from another person to put on and taking off regular lower body clothing?: Total 6 Click Score: 6   End of Session Nurse Communication: Mobility status  Activity Tolerance: Patient limited by lethargy Patient left: in bed;with  call bell/phone within reach  OT Visit Diagnosis: Cognitive communication deficit (R41.841);Hemiplegia and hemiparesis Symptoms and signs involving cognitive functions: Cerebral infarction Hemiplegia - Right/Left: Left Hemiplegia - dominant/non-dominant: Non-Dominant                Time: 0973-5329 OT Time Calculation (min): 21 min Charges:  OT General Charges $OT Visit: 1 Visit OT Evaluation $OT Eval Moderate Complexity: 1 Mod  Lucille Passy, OTR/L Acute Rehabilitation Services Pager [phone] Office [phone]   Lucille Passy M 03/26/2019, 5:00 PM

## 2019-03-26 NOTE — Plan of Care (Signed)
SpO2 93-95% on RA. Resp e/u.

## 2019-03-26 NOTE — Progress Notes (Signed)
  Speech Language Pathology Treatment: Dysphagia  Patient Details Name: [name] MRN: [id number] DOB: 1941-03-14 Today's Date: 03/26/2019 Time: 0940-1010 SLP Time Calculation (min) (ACUTE ONLY): 30 min  Assessment / Plan / Recommendation Clinical Impression  Spoke with Dr. Leonie Man, RN and palliative care team about [place]. Family does not want to pursue a PEG. Trials of puree and thin liquid presented to assess for recommended diet. Patient was alert and aware of food presentation. Oral phase was observed to be normal with good bolus control and a timely swallow initiation. He verbally expressed desire for more to eat. He had no cough or throat clearing with purees or thin liquids (presented by cup and straw). When presented with a cracker he turned his head. Recommend a diet of purees (Dsy 1) and thin liquids. Medications crushed in purees. Speech therapy to follow for diet tolerance and diet upgrade.     HPI HPI: 78yo male with PMH of hyperaldosteronism, HTN, T2DM, neurogenic bladder s/p suprapubic catheter, h/o CVA with left sided hemiparesis, vascular dementia presenting from [facility] due to nonresolving pneumonia. CT head did not reveal any acute abnormality. MR Brain ordered on 6/8 but not completed at the time of this update to HPI.      SLP Plan  Continue with current plan of care       Recommendations  Diet recommendations: Dysphagia 1 (puree);Thin liquid Liquids provided via: Cup;Straw Medication Administration: Crushed with puree Supervision: Full supervision/cueing for compensatory strategies Compensations: Slow rate;Small sips/bites Postural Changes and/or Swallow Maneuvers: Seated upright 90 degrees;Upright 30-60 min after meal                Plan: Continue with current plan of care       [place], MA, CCC-SLP 03/26/2019 10:23 AM

## 2019-03-27 LAB — GLUCOSE, CAPILLARY
Glucose-Capillary: 169 mg/dL — ABNORMAL HIGH (ref 70–99)
Glucose-Capillary: 147 mg/dL — ABNORMAL HIGH (ref 70–99)
Glucose-Capillary: 170 mg/dL — ABNORMAL HIGH (ref 70–99)
Glucose-Capillary: 134 mg/dL — ABNORMAL HIGH (ref 70–99)
Glucose-Capillary: 219 mg/dL — ABNORMAL HIGH (ref 70–99)

## 2019-03-27 MED ORDER — TORSEMIDE 20 MG PO TABS
20.0000 mg | ORAL_TABLET | Freq: Every day | ORAL | Status: DC
  Administered 2019-03-28 – 2019-04-02 (×6): 20 mg via ORAL
  Filled 2019-03-27 (×6): qty 1

## 2019-03-27 MED ORDER — ENOXAPARIN SODIUM 40 MG/0.4ML ~~LOC~~ SOLN
40.0000 mg | SUBCUTANEOUS | Status: DC
  Administered 2019-03-27 – 2019-03-31 (×5): 40 mg via SUBCUTANEOUS
  Filled 2019-03-27 (×5): qty 0.4

## 2019-03-27 MED ORDER — CLEVIDIPINE BUTYRATE 0.5 MG/ML IV EMUL
0.0000 mg/h | INTRAVENOUS | Status: DC
  Administered 2019-03-28: 10 mg/h via INTRAVENOUS
  Administered 2019-03-28: 01:00:00 2 mg/h via INTRAVENOUS
  Administered 2019-03-28: 5 mg/h via INTRAVENOUS
  Administered 2019-03-28: 6 mg/h via INTRAVENOUS
  Administered 2019-03-29: 4 mg/h via INTRAVENOUS
  Filled 2019-03-27 (×8): qty 50

## 2019-03-27 MED ORDER — HYDRALAZINE HCL 50 MG PO TABS
50.0000 mg | ORAL_TABLET | Freq: Three times a day (TID) | ORAL | Status: DC
  Administered 2019-03-27 – 2019-03-28 (×4): 50 mg via ORAL
  Filled 2019-03-27 (×4): qty 1

## 2019-03-27 MED ORDER — HYDRALAZINE HCL 20 MG/ML IJ SOLN
10.0000 mg | INTRAMUSCULAR | Status: DC | PRN
  Administered 2019-03-27: 10 mg via INTRAVENOUS
  Filled 2019-03-27: qty 1

## 2019-03-27 NOTE — Progress Notes (Signed)
NAME:  [name], MRN:  [id number], DOB:  13-Mar-1941, LOS: 7 ADMISSION DATE:  03/20/2019, CONSULTATION DATE:  03/24/19 REFERRING MD:  Dr. [name], CHIEF COMPLAINT:  [facility] / [place]    Brief History   78 y/o M admitted 6/5 with concern for possible PNA.  Developed worsening mental status 6/8 with subsequent MRI showing acute / subacute subarachnoid & subdural hemorrhage.   History of present illness   78 y/o M who was admitted on 6/5 from a SNF with hypertension, fever, tachypnea, hypoxemia and concern for RLL PNA based on CXR. He was sent to [facility] for evaluation 6/5 and admitted with concern for sepsis secondary to aspiration PNA.  Initial work up notable for hypernatremia, AKI, leukocytosis and mild anemia. The patient was admitted for further evaluation per IMTS.  He was treated with IV antibiotics for HCAP.  On the am of 6/8, he was noted to be altered initially thought related to sepsis.  BP was also significantly elevated and and MRI was ordered.  MRI showed an acute / subacute subarachnoid & subdural hemorrhage.  The patient was evaluated by Neurology and transferred to ICU.    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 Subdural blood most evident in the left sylvian fissure & prepontine space.  Subdural blood most evident posteriorly R>L.  Layering blood products within the lateral ventricles bilaterally w/o hydrocephalus, additional subarachnoid blood and possible parenchymal petechial hemorrhage int he anteromedial frontal lobes  bilaterally, R MCA aneurysm clip in place CTA Head / Neck 6/9 >> intraventricular hemorrhage bilaterally with ventricular enlargement, likely due to acute hemorrhage, SAH in the sylvian fissures bilaterally, pattern not typical of aneurysm fupture, surgical clipping of R MCA aneurysm, atrophy with chronic microvascular ischemic changes   Micro Data:  BCx2 6/5 >> 1/2 diptheroids  BCID 6/5 >> negative   Antimicrobials:  Unasyn 6/5 >> 6/6 Azithro 6/7 >> 6/9  Zosyn 6/5 >> 6/9   Interim history/subjective:  Calm but disoriented. Limited verbal responses to questioning.  Extensive discussions with palliative care, patient is now DNR and if declines further will transition to hospice.  Objective   Blood pressure (!) 156/75, pulse 64, temperature 98.7 F (37.1 C), temperature source Axillary, resp. rate 18, height 5\' 7"  (1.702 m), weight 79.3 kg, SpO2 99 %.        Intake/Output Summary (Last 24 hours) at 03/27/2019 1007 Last data filed at 03/27/2019 0900 Gross per 24 hour  Intake 1047.44 ml  Output 675 ml  Net 372.44 ml   Filed Weights   03/25/19 0500 03/26/19 0500 03/27/19 0500  Weight: 77.1 kg 79.9 kg 79.3 kg    Examination: General: chronically ill appearing male in NAD HEENT: MM pink/moist, pupils 2-3 mm reactive Neuro: Awakens to voice, follows simple commands on right, flicker on left with painful stimuli  CV: s1s2 rrr, SR with frequent PVC's PULM: even/non-labored, lungs bilaterally clear  GI: soft, non-tender, bsx4 hyperactive, suprapubic catheter in place  Extremities: warm/dry, no edema  Skin: no rashes or lesions  Resolved Hospital Problem list      Assessment & Plan:   Subarachnoid hemorrhage with intraventricular extension Subdural hemorrhage  Hypertensive Crisis - suspected traumatic etiology, ? Fall PTA - SBP goal 140-153mmHg - Acceptable BP control with oral agents. - Hold antiplatelet agents   Dementia  CVA history with residual Left hemiparesis.  Unable to perform ADLs or even move his wheelchair.  - Supportive care - Palliative care has been consulted by primary team.  - continue aricept, namenda, zoloft for now  Suprapubic Catheter  -will change out today -continue myrbetriq   Best practice:  Diet: supervised oral diet..  Pain/Anxiety/Delirium protocol (if indicated): NA VAP protocol (if indicated): NA DVT prophylaxis: SCD GI prophylaxis: PPI Glucose control: SSI Mobility: BR Code Status: DNRDNI Family Communication: Wife ([name]) updated on patients status in detail 6/9. Introduced concept of CPR / short term ventilation.  Recommended no CPR in the event of cardiac arrest due to underlying comorbid conditions.  Consider short term ventilation if Neurology feels appropriate and patient were to need. Family would like to discuss and think about his plan.  Difficult for them to make decisions as they have not seen him since March due to [place].   Dysphagia and feeding tube placement should factor into decision making. Disposition: transfer to IMTS.   Labs   CBC: Recent Labs  Lab 03/21/19 0600 03/22/19 0659 03/23/19 0803 03/24/19 0723 03/25/19 0524  WBC 14.6* 13.4* 11.3* 13.0* 13.3*  NEUTROABS  --  10.9* 8.6* 10.5*  --   HGB 11.6* 12.2* 11.1* 11.4* 11.3*  HCT 39.4 40.7 37.6* 39.2 37.7*  MCV 84.0 83.4 84.1 85.6 83.6  PLT 263 282 270 262 195    Basic Metabolic Panel: Recent Labs  Lab 03/22/19 0659 03/22/19 1859 03/23/19 0803 03/24/19 0723 03/25/19 0524  NA 150* 143 144 146* 143  K 3.4* 3.1* 3.3* 3.4* 3.3*  CL 113* 107 108 107 105  CO2 29 28 28 30 26   GLUCOSE 122* 197* 134* 100* 161*  BUN 23 21 17 16 15   CREATININE 1.22 1.28* 1.16 1.32* 1.05  CALCIUM 9.4 8.9 8.9 9.2 9.2  MG  --   --   --   --  2.0  PHOS  --   --   --   --  2.3*   GFR: Estimated Creatinine Clearance: 56.8 mL/min (by C-G formula based on SCr of 1.05 mg/dL). Recent Labs  Lab 03/22/19 0659 03/23/19 0803 03/24/19 0723 03/25/19 0524   WBC 13.4* 11.3* 13.0* 13.3*    Liver Function Tests: No results for input(s): AST, ALT, ALKPHOS, BILITOT, PROT, ALBUMIN in the last 168 hours. No results for input(s): LIPASE, AMYLASE in the last 168 hours. No results for input(s): AMMONIA in the last 168 hours.  ABG    Component Value Date/Time   TCO2 26 08/29/2016 0014     Coagulation Profile: Recent Labs  Lab 03/24/19 2143  INR 1.2    Cardiac Enzymes: No results for input(s): CKTOTAL, CKMB, CKMBINDEX, TROPONINI in the last 168 hours.  HbA1C: Hemoglobin A1C  Date/Time Value Ref Range Status  06/07/2018 7.2  Final  02/04/2018 7.0  Final    CBG: Recent Labs  Lab 03/26/19 1604 03/26/19 1956 03/26/19 2329 03/27/19 0332 03/27/19 0758  GLUCAP 117* 217* 217* 169* 147*    35 min with >50% in counseling and coordination of care.  Kipp Brood, MD [facility] ICU Physician [place]  Pager: [phone] Mobile: [phone]  After hours: [phone].  03/27/2019, 10:07 AM

## 2019-03-27 NOTE — Progress Notes (Signed)
Physical Therapy Treatment Patient Details Name: [name] MRN: [id number] DOB: 19-Feb-1941 Today's Date: 03/27/2019    History of Present Illness  Mr. Owensby is a 78yo male with PMH of hyperaldosteronism, HTN, T2DM, neurogenic bladder s/p suprapubic catheter, h/o CVA with left sided hemiparesis, vascular dementia presenting from [place] [facility] due to nonresolving pneumonia. Pt w/c bound and dependent for all ADLs.    PT Comments    Pt remains lethargic, will arouse to stimulation but quickly falls back asleep. Did state name and "not much," when asked if he was in any pain. Session focused on placing bed in chair position to promote upright and passive range of motion to extremities. Pt with spontaneous movements of RUE but unable to elicit with hand over hand guidance. Frequency decreased based on lack of progress and considering pt baseline.     Follow Up Recommendations  SNF;Supervision/Assistance - 24 hour     Equipment Recommendations  None recommended by PT    Recommendations for Other Services       Precautions / Restrictions Precautions Precautions: Fall Precaution Comments: L hemiparesis Restrictions Weight Bearing Restrictions: No    Mobility  Bed Mobility Overal bed mobility: Needs Assistance Bed Mobility: Rolling Rolling: Total assist         General bed mobility comments: partial roll towards right for placement of pillow, does not assist  Transfers                 General transfer comment: Pt w/c bound PTA.  Unable to safely attempt   Ambulation/Gait                 Stairs             Wheelchair Mobility    Modified Rankin (Stroke Patients Only) Modified Rankin (Stroke Patients Only) Pre-Morbid Rankin Score: Severe disability Modified Rankin: Severe disability     Balance                                            Cognition Arousal/Alertness: Lethargic Behavior During Therapy: Flat  affect Overall Cognitive Status: Difficult to assess Area of Impairment: Attention                   Current Attention Level: Focused           General Comments: pt able to open eyes briefly to stimulation. unable to follow commands except stating name once and "not much," in response to if he was in pain.      Exercises General Exercises - Upper Extremity Shoulder Flexion: 5 reps;Both;Supine;PROM Shoulder Extension: PROM;Both;5 reps;Supine Shoulder ABduction: PROM;5 reps;Supine;Both Elbow Flexion: PROM;Both;5 reps;Seated Elbow Extension: PROM;Both;5 reps;Supine Digit Composite Flexion: PROM;10 reps;Both;Supine Composite Extension: PROM;Both;10 reps;Supine General Exercises - Lower Extremity Ankle Circles/Pumps: PROM;Both;10 reps;Supine Heel Slides: PROM;10 reps;Both;Supine Hip Flexion/Marching: 10 reps;Both;PROM;Supine    General Comments        Pertinent Vitals/Pain Pain Assessment: Faces Faces Pain Scale: Hurts a little bit Pain Location: grimacing with bed going into chair position initially Pain Descriptors / Indicators: Grimacing Pain Intervention(s): Monitored during session    Home Living                      Prior Function            PT Goals (current goals can now be found in the  care plan section) Acute Rehab PT Goals Patient Stated Goal: unable PT Goal Formulation: Patient unable to participate in goal setting Time For Goal Achievement: 04/08/19 Potential to Achieve Goals: Poor Progress towards PT goals: Not progressing toward goals - comment    Frequency    Min 1X/week      PT Plan Frequency needs to be updated    Co-evaluation              AM-PAC PT "6 Clicks" Mobility   Outcome Measure  Help needed turning from your back to your side while in a flat bed without using bedrails?: Total Help needed moving from lying on your back to sitting on the side of a flat bed without using bedrails?: Total Help needed moving  to and from a bed to a chair (including a wheelchair)?: Total Help needed standing up from a chair using your arms (e.g., wheelchair or bedside chair)?: Total Help needed to walk in hospital room?: Total Help needed climbing 3-5 steps with a railing? : Total 6 Click Score: 6    End of Session   Activity Tolerance: Patient limited by lethargy Patient left: in bed;with call bell/phone within reach;with bed alarm set Nurse Communication: Mobility status PT Visit Diagnosis: Hemiplegia and hemiparesis;Muscle weakness (generalized) (M62.81) Hemiplegia - Right/Left: Left Hemiplegia - dominant/non-dominant: Non-dominant Hemiplegia - caused by: Cerebral infarction     Time: 0177-9390 PT Time Calculation (min) (ACUTE ONLY): 25 min  Charges:  $Therapeutic Exercise: 23-37 mins                     Ellamae Sia, PT, DPT Acute Rehabilitation Services Pager [phone] Office [phone]    Willy Eddy 03/27/2019, 5:43 PM

## 2019-03-27 NOTE — Progress Notes (Signed)
2330- BP still 185/86 after all PRN medicine given. Notified CCM again, orders being placed to transfer patient back to ICU for celviprex gtt.

## 2019-03-27 NOTE — Progress Notes (Signed)
STROKE TEAM PROGRESS NOTE   INTERVAL HISTORY Patient remains neurologically stable.  Patient passed swallow eval yesterday and is on a diet.  Blood pressure adequately controlled.  Family has agreed to DNR but want to continue medical care and transfer to nursing home. Vitals:   03/27/19 0830 03/27/19 0900 03/27/19 0930 03/27/19 1130  BP: (!) 160/61 (!) 156/64 (!) 156/75 (!) 190/90  Pulse: 65 66 64 65  Resp: 15 16 18 15   Temp:      TempSrc:      SpO2: 98% 98% 99% 93%  Weight:      Height:        CBC:  Recent Labs  Lab 03/23/19 0803 03/24/19 0723 03/25/19 0524  WBC 11.3* 13.0* 13.3*  NEUTROABS 8.6* 10.5*  --   HGB 11.1* 11.4* 11.3*  HCT 37.6* 39.2 37.7*  MCV 84.1 85.6 83.6  PLT 270 262 469    Basic Metabolic Panel:  Recent Labs  Lab 03/24/19 0723 03/25/19 0524  NA 146* 143  K 3.4* 3.3*  CL 107 105  CO2 30 26  GLUCOSE 100* 161*  BUN 16 15  CREATININE 1.32* 1.05  CALCIUM 9.2 9.2  MG  --  2.0  PHOS  --  2.3*   Lipid Panel:     Component Value Date/Time   CHOL 153 06/07/2018   TRIG 290 (H) 03/26/2019 0424   TRIG 133 03/28/2007 1347   HDL 39 06/07/2018   CHOLHDL 2.2 06/08/2014 1622   VLDL 9 06/08/2014 1622   LDLCALC 95 06/07/2018   HgbA1c:  Lab Results  Component Value Date   HGBA1C 7.2 06/07/2018   Urine Drug Screen:     Component Value Date/Time   LABOPIA NONE DETECTED 08/06/2012 1900   COCAINSCRNUR NONE DETECTED 08/06/2012 1900   COCAINSCRNUR NEGATIVE 03/11/2009 0532   LABBENZ NONE DETECTED 08/06/2012 1900   LABBENZ NEGATIVE 03/11/2009 0532   AMPHETMU NONE DETECTED 08/06/2012 1900   THCU NONE DETECTED 08/06/2012 1900   LABBARB NONE DETECTED 08/06/2012 1900    Alcohol Level     Component Value Date/Time   ETH <11 08/06/2012 1705    IMAGING No results found.  PHYSICAL EXAM Elderly African-American male not in distress. . Afebrile. Head is nontraumatic. Neck is supple without bruit.    Cardiac exam no murmur or gallop. Lungs are clear to  auscultation. Distal pulses are well felt. Neurological Exam :  Awake alert globally aphasic speaks only few words but not sentences.  Follows only occasional commands.  Blinks to threat bilaterally.  Extraocular movements show restriction of left gaze possibly left 6th nerve palsy.  Mild left lower facial weakness.  Tongue midline.  Motor system exam shows spastic left hemiplegia with non-fixed flexion contracture left wrist and fingers.  Tone is increased on the left side compared to the right.  Left upper extremity strength is 3/5 left lower extremity strength 4/5.  And appears to have normal strength on the right side.  Left plantar is upgoing right is downgoing.  Sensation appears to be preserved bilaterally.  Gait not tested.  ASSESSMENT/PLAN [name] is a 78 y.o. 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 Admitted 03/20/19 w/ sepsis d/t aspiration w/ metabolic encephalopathy. D/t extreme HTN, MRI was done which showed a acute/subacute SAH and SDH w/ petechial parenchymal hmg in  L sylvian fissure and L frontal operculum, SAH B anterior cerebral falx and R > L anterior to brainstem. Family reports falls at [facility].  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 SDH in L sylvian fissure and prepontine space. SDH posterior R>L. Layering B IVH. No sign shift. Petechial hmg L frontal operculum, less posterior R frontal operculum. SAH and possible IPH anteromedial B frontal lobes along falx. Mult punctate foci suggesting underlying vasculitis. R MCA aneurysm clip in place.  CTA head & neck B IVH w/ hydrocephalus. B SAH sylvian fissure. R MCA aneurysmal surgical clip in place w/o recurrent aneurysm. Atrophy w/ SVD.  EEG mild  generalized slowing.  No definite epileptiform activity.  SCDs for VTE prophylaxis  clopidogrel 75 mg daily prior to admission, now on No antithrombotic given hmg  Therapy recommendations: SNF   palliative care to meet with wife today r/t goals of care  Disposition:  pending   Hypertension  SBP goal 120-160 . Long-term BP goal normotensive  Hyperlipidemia  Home meds:  No statin  LDL not checked  Long-term goal < 70 given prior ischemic stroke/TIA  No statin at this time given hmg  Consider lipid panel baseline on plan of care and statin as appropriate  Diabetes type II   CBGs  SSI  Dysphagia  NPO  Other Stroke Risk Factors  Advanced age  Former Cigarette smoker  Hx stroke/TIA  06/2010 - R brain TIA  Reported hx - L thalamic infarct w/ residual LUE HP  Family hx stroke (mother)  Obstructive sleep apnea, on CPAP at home  Hx Congestive heart failure  Other Active Problems  Hx cerebral aneurysm 05/2008 w/o rupture s/p crani and clipping w/ resultant L face, arm and leg numbness  Cognitive impairment  Hospital day # 7  Continue strict blood pressure control.  Mobilize out of bed.  Therapy consults.  Transfer to neurology floor bed.  Transfer to nursing facility for rehab when bed available over the next few days.  Stroke team will sign off.  Kindly call for questions.  Discussed with Dr. Lynetta Mare critical care team  I have spent a total of  25 minutes with the patient reviewing hospital notes,  test results, labs and examining the patient as well as establishing an assessment and plan that was discussed personally with the patient.  > 50% of time was spent in direct patient care.         Antony Contras, MD Medical Director [facility] Pager: [phone] 03/27/2019 12:41 PM   To contact Stroke Continuity provider, please refer to [email or web address] After hours, contact General Neurology

## 2019-03-27 NOTE — Progress Notes (Addendum)
Checked patient's BP at 2137 before scheduled hydralazine dose. BP was 236/98. Scheduled dose given and PRN labetalol 20mg  given at 2139. Recheck BP was still above BP goal of 160, so another dose of labetalol given at 2155. A third dose was given at 2205 for BP 183/87. Recheck BP was 172/82, still above goal of 160 SBP. Internal medicine paged at 2220 because HR now 62 and another dose of labetalol would make HR go lower. Per Dr. Laural Golden, Internal medicine is not taking over care until tomorrow, suggested calling CCM. CCM called at 2225, they suggested calling neurology for their opinion. Neurology paged at 2230, but neurology signed off 6/12 day shift. Dr. Cheral Marker with neurology to get in touch with CCM MD to make suggestion. New order for IV hydralazine 10mg  every 4h PRN. Med given at 2151. Will continue to monitor BP

## 2019-03-28 DIAGNOSIS — A419 Sepsis, unspecified organism: Principal | ICD-10-CM

## 2019-03-28 DIAGNOSIS — N179 Acute kidney failure, unspecified: Secondary | ICD-10-CM

## 2019-03-28 DIAGNOSIS — R6520 Severe sepsis without septic shock: Secondary | ICD-10-CM

## 2019-03-28 MED ORDER — HYDRALAZINE HCL 50 MG PO TABS
100.0000 mg | ORAL_TABLET | Freq: Three times a day (TID) | ORAL | Status: DC
  Administered 2019-03-28 – 2019-04-02 (×16): 100 mg via ORAL
  Filled 2019-03-28 (×16): qty 2

## 2019-03-28 MED ORDER — CARVEDILOL 25 MG PO TABS
25.0000 mg | ORAL_TABLET | Freq: Two times a day (BID) | ORAL | Status: DC
  Administered 2019-03-28 – 2019-04-02 (×9): 25 mg via ORAL
  Filled 2019-03-28: qty 1
  Filled 2019-03-28 (×2): qty 2
  Filled 2019-03-28: qty 1
  Filled 2019-03-28 (×2): qty 2
  Filled 2019-03-28 (×2): qty 1
  Filled 2019-03-28: qty 2
  Filled 2019-03-28: qty 1

## 2019-03-28 MED ORDER — CHLORHEXIDINE GLUCONATE CLOTH 2 % EX PADS
6.0000 | MEDICATED_PAD | Freq: Every day | CUTANEOUS | Status: DC
  Administered 2019-03-28 – 2019-04-02 (×6): 6 via TOPICAL

## 2019-03-28 MED ORDER — HYDRALAZINE HCL 20 MG/ML IJ SOLN
10.0000 mg | INTRAMUSCULAR | Status: DC | PRN
  Administered 2019-03-28 – 2019-03-29 (×3): 20 mg via INTRAVENOUS
  Administered 2019-03-29: 10 mg via INTRAVENOUS
  Administered 2019-03-30 – 2019-03-31 (×3): 20 mg via INTRAVENOUS
  Filled 2019-03-28 (×6): qty 1

## 2019-03-28 NOTE — Progress Notes (Signed)
BP >859 systolic, cleviprex gtt increased.

## 2019-03-28 NOTE — Progress Notes (Signed)
NAME:  [name], MRN:  [id number], DOB:  [birth year], LOS: 8 ADMISSION DATE:  03/20/2019, CONSULTATION DATE:  6/9 REFERRING MD:  [name], CHIEF COMPLAINT:  confusion   Brief History   78 y/o male admitted on 6/5 with concern for pneumonia, later developed confusion and found to have intracranial bleeding.   Past Medical History  DM2 HTN OSA Depression Dementia Hypertension HLD CHF Cerebral aneurysm SNF  Significant Hospital Events   6/05  Admit from SNF with sepsis in setting of aspiration PNA 6/08  Lethargy  6/08  Tx to ICU with [facility] / SDH 6/11 Palliative consultation> no escalation of care but continue medical management, DNR  Consults:  PCCM Neurology Palliative  Procedures:   Significant Diagnostic Tests:  MRI 6/9 >> acute / subacute subarachnoid & subdural hemorrhage. Subdural blood most evident in the left sylvian fissure & prepontine space.  Subdural blood most evident posteriorly R>L.  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 CTA Head / Neck 6/9 >> intraventricular hemorrhage bilaterally with ventricular enlargement, likely due to acute hemorrhage, SAH in the sylvian fissures bilaterally, pattern not typical of aneurysm fupture, surgical clipping of R MCA aneurysm, atrophy with chronic microvascular ischemic changes   Micro Data:  Blood culture 6/5 >    Antimicrobials:  Unasyn 6/5 >> 6/6 Azithro 6/7 >> 6/9  Zosyn 6/5 >> 6/9   Interim history/subjective:  Transferred out of ICU overnight, but then moved back due to severely elevated BP (831 systolic) and placed on cleviprex again. Per nursing no change in mental status with BP change.  Taking by mouth this morning, wakes to voice  Objective   Blood pressure (!) 151/64, pulse 64, temperature 98.2 F (36.8 C),  temperature source Axillary, resp. rate (!) 24, height 5\' 7"  (1.702 m), weight 84.1 kg, SpO2 93 %.        Intake/Output Summary (Last 24 hours) at 03/28/2019 0941 Last data filed at 03/28/2019 0700 Gross per 24 hour  Intake 597.32 ml  Output 1350 ml  Net -752.68 ml   Filed Weights   03/27/19 0500 03/27/19 1200 03/28/19 0030  Weight: 79.3 kg 78.2 kg 84.1 kg    Examination:  General:  Resting comfortably in bed HENT: NCAT OP clear PULM: CTA B, normal effort CV: RRR, no mgr GI: BS+, soft, nontender MSK: normal bulk and tone Neuro: sleepy but will open eyes to voice   Resolved Hospital Problem list     Assessment & Plan:  Subarachnoid hemorrhage with intraventricular extension Subdural hemmorhage Hypertensive emergency Goal SBP < 160 Wean off of cleviprex Increase hydralazine to 100mg  po tid Increase coreg to 25mg  bid Continue lisinopril Continue amlodipine  Dementia History of stroke Continue aricept, naemda, zoloft  Has suprapubic catheter Care per routine  PCCM happy to be primary service if patient remains in ICU  Best practice:  Diet: dysphagia Pain/Anxiety/Delirium protocol (if indicated): n/a VAP protocol (if indicated): n/a DVT prophylaxis: SCD GI prophylaxis: PPI Glucose control: SSI Mobility: bed rest Code Status: DNR Family Communication: updated at length on 6/12, no 6/13 answer when I called, left message Disposition: remain in ICU until off cleviprex, then back to IMTS  Labs   CBC: Recent Labs  Lab 03/22/19 0659 03/23/19 0803 03/24/19 0723 03/25/19 0524  WBC 13.4* 11.3* 13.0* 13.3*  NEUTROABS 10.9* 8.6* 10.5*  --   HGB 12.2* 11.1*  11.4* 11.3*  HCT 40.7 37.6* 39.2 37.7*  MCV 83.4 84.1 85.6 83.6  PLT 282 270 262 063    Basic Metabolic Panel: Recent Labs  Lab 03/22/19 0659 03/22/19 1859 03/23/19 0803 03/24/19 0723 03/25/19 0524  NA 150* 143 144 146* 143  K 3.4* 3.1* 3.3* 3.4* 3.3*  CL 113* 107 108 107 105  CO2 29 28 28 30  26   GLUCOSE 122* 197* 134* 100* 161*  BUN 23 21 17 16 15   CREATININE 1.22 1.28* 1.16 1.32* 1.05  CALCIUM 9.4 8.9 8.9 9.2 9.2  MG  --   --   --   --  2.0  PHOS  --   --   --   --  2.3*   GFR: Estimated Creatinine Clearance: 63 mL/min (by C-G formula based on SCr of 1.05 mg/dL). Recent Labs  Lab 03/22/19 0659 03/23/19 0803 03/24/19 0723 03/25/19 0524  WBC 13.4* 11.3* 13.0* 13.3*    Liver Function Tests: No results for input(s): AST, ALT, ALKPHOS, BILITOT, PROT, ALBUMIN in the last 168 hours. No results for input(s): LIPASE, AMYLASE in the last 168 hours. No results for input(s): AMMONIA in the last 168 hours.  ABG    Component Value Date/Time   TCO2 26 08/29/2016 0014     Coagulation Profile: Recent Labs  Lab 03/24/19 2143  INR 1.2    Cardiac Enzymes: No results for input(s): CKTOTAL, CKMB, CKMBINDEX, TROPONINI in the last 168 hours.  HbA1C: Hemoglobin A1C  Date/Time Value Ref Range Status  06/07/2018 7.2  Final  02/04/2018 7.0  Final    CBG: Recent Labs  Lab 03/27/19 0332 03/27/19 0758 03/27/19 1306 03/27/19 1715 03/27/19 2002  GLUCAP 169* 147* 170* 134* 219*     Critical care time: 31 minutes    Roselie Awkward, MD [place] [facility] Pager: [phone] Cell: [phone] If no response, call [phone]

## 2019-03-28 NOTE — Progress Notes (Addendum)
Per Wadie Lessen NP pt may have 1 visitor under current visitation policy as he lacks decisional capacity. This had been previously relayed to family, so that order will be honored.

## 2019-03-29 DIAGNOSIS — I160 Hypertensive urgency: Secondary | ICD-10-CM

## 2019-03-29 LAB — GLUCOSE, CAPILLARY
Glucose-Capillary: 225 mg/dL — ABNORMAL HIGH (ref 70–99)
Glucose-Capillary: 171 mg/dL — ABNORMAL HIGH (ref 70–99)

## 2019-03-29 LAB — BASIC METABOLIC PANEL
Anion gap: 12 (ref 5–15)
BUN: 11 mg/dL (ref 8–23)
CO2: 26 mmol/L (ref 22–32)
Calcium: 9.1 mg/dL (ref 8.9–10.3)
Chloride: 98 mmol/L (ref 98–111)
Creatinine, Ser: 0.88 mg/dL (ref 0.61–1.24)
GFR calc Af Amer: >60 mL/min (ref >60)
GFR calc non Af Amer: >60 mL/min (ref >60)
Glucose, Bld: 225 mg/dL — ABNORMAL HIGH (ref 70–99)
Potassium: 3 mmol/L — ABNORMAL LOW (ref 3.5–5.1)
Sodium: 136 mmol/L (ref 135–145)

## 2019-03-29 LAB — TRIGLYCERIDES: Triglycerides: 122 mg/dL (ref <150)

## 2019-03-29 MED ORDER — INSULIN ASPART 100 UNIT/ML ~~LOC~~ SOLN
0.0000 [IU] | Freq: Every day | SUBCUTANEOUS | Status: DC
  Administered 2019-03-30: 2 [IU] via SUBCUTANEOUS

## 2019-03-29 MED ORDER — INSULIN ASPART 100 UNIT/ML ~~LOC~~ SOLN
0.0000 [IU] | Freq: Three times a day (TID) | SUBCUTANEOUS | Status: DC
  Administered 2019-03-29 – 2019-03-30 (×2): 5 [IU] via SUBCUTANEOUS
  Administered 2019-03-30 (×2): 3 [IU] via SUBCUTANEOUS
  Administered 2019-03-31: 5 [IU] via SUBCUTANEOUS
  Administered 2019-03-31: 2 [IU] via SUBCUTANEOUS

## 2019-03-29 MED ORDER — POTASSIUM CHLORIDE CRYS ER 20 MEQ PO TBCR
40.0000 meq | EXTENDED_RELEASE_TABLET | Freq: Two times a day (BID) | ORAL | Status: AC
  Administered 2019-03-29 (×2): 40 meq via ORAL
  Filled 2019-03-29 (×2): qty 2

## 2019-03-29 NOTE — Progress Notes (Signed)
NAME:  [name], MRN:  [id number], DOB:  05/01/1941, LOS: 9 ADMISSION DATE:  03/20/2019, CONSULTATION DATE:  6/9 REFERRING MD:  [name], CHIEF COMPLAINT:  confusion   Brief History   78 y/o male admitted on 6/5 with concern for pneumonia, later developed confusion and found to have intracranial bleeding.   Past Medical History  DM2 HTN OSA Depression Dementia Hypertension HLD CHF Cerebral aneurysm SNF  Significant Hospital Events   6/05  Admit from SNF with sepsis in setting of aspiration PNA 6/08  Lethargy  6/08  Tx to ICU with [facility] / SDH 6/11 Palliative consultation> no escalation of care but continue medical management, DNR 6./13 - Transferred out of ICU overnight, but then moved back due to severely elevated BP (782 systolic) and placed on cleviprex again. Per nursing no change in mental status with BP change.  Taking by mouth this morning, wakes to voice   Consults:  PCCM Neurology Palliative  Procedures:   Significant Diagnostic Tests:  MRI 6/9 >> acute / subacute subarachnoid & subdural hemorrhage. Subdural blood most evident in the left sylvian fissure & prepontine space.  Subdural blood most evident posteriorly R>L.  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 CTA Head / Neck 6/9 >> intraventricular hemorrhage bilaterally with ventricular enlargement, likely due to acute hemorrhage, SAH in the sylvian fissures bilaterally, pattern not typical of aneurysm fupture, surgical clipping of R MCA aneurysm, atrophy with chronic microvascular ischemic changes   Micro Data:  Blood culture 6/5 >    Antimicrobials:  Unasyn 6/5 >> 6/6 Azithro 6/7 >> 6/9  Zosyn 6/5 >> 6/9   Interim history/subjective:   6/14- almost off cleviprex. SBP 140s with MAP 98 now. Not on vent.  Getting bath by RN  Objective   Blood pressure (!) 142/60, pulse (!) 49, temperature 97.7 F (36.5 C), temperature source Oral, resp. rate (!) 21, height 5\' 7"  (1.702 m), weight 84.1 kg, SpO2 94 %.        Intake/Output Summary (Last 24 hours) at 03/29/2019 1108 Last data filed at 03/29/2019 1000 Gross per 24 hour  Intake 629.27 ml  Output 1650 ml  Net -1020.73 ml   Filed Weights   03/27/19 0500 03/27/19 1200 03/28/19 0030  Weight: 79.3 kg 78.2 kg 84.1 kg    General Appearance:  Looks chronic unwell Head:  Normocephalic, without obvious abnormality, atraumatic Eyes:  PERRL - yes, conjunctiva/corneas - muddy     Ears:  Normal external ear canals, both ears Nose:  G tube - no Throat:  ETT TUBE - no , OG tube - no Neck:  Supple,  No enlargement/tenderness/nodules Lungs: Clear to auscultation bilaterally, Heart:  S1 and S2 normal, no murmur, CVP - no.  Pressors - no Abdomen:  Soft, no masses, no organomegaly Genitalia / Rectal:  Looks normal Extremities:  Extremities- intac Skin:  ntact in exposed areas . Sacral area - not examined Neurologic:  Sedation - none -> RASS - 0 . Moves all 4s - yes. CAM-ICU - not evaluated . Orientation - awake      LABS    PULMONARY No results for input(s): PHART, PCO2ART, PO2ART, HCO3, TCO2, O2SAT in the last 168 hours.  Invalid input(s): PCO2, PO2  CBC Recent Labs  Lab 03/23/19 0803 03/24/19 0723 03/25/19 0524  HGB 11.1* 11.4* 11.3*  HCT 37.6* 39.2 37.7*  WBC  11.3* 13.0* 13.3*  PLT 270 262 307    COAGULATION Recent Labs  Lab 03/24/19 2143  INR 1.2    CARDIAC  No results for input(s): TROPONINI in the last 168 hours. No results for input(s): PROBNP in the last 168 hours.   CHEMISTRY Recent Labs  Lab 03/22/19 1859 03/23/19 0803 03/24/19 0723 03/25/19 0524 03/29/19 0601  NA 143 144 146* 143 136  K 3.1* 3.3* 3.4* 3.3* 3.0*  CL 107 108 107 105 98  CO2 28 28 30 26 26   GLUCOSE 197* 134* 100* 161* 225*  BUN 21 17 16  15 11   CREATININE 1.28* 1.16 1.32* 1.05 0.88  CALCIUM 8.9 8.9 9.2 9.2 9.1  MG  --   --   --  2.0  --   PHOS  --   --   --  2.3*  --    Estimated Creatinine Clearance: 75.2 mL/min (by C-G formula based on SCr of 0.88 mg/dL).   LIVER Recent Labs  Lab 03/24/19 2143  INR 1.2     INFECTIOUS No results for input(s): LATICACIDVEN, PROCALCITON in the last 168 hours.   ENDOCRINE CBG (last 3)  Recent Labs    03/27/19 1306 03/27/19 1715 03/27/19 2002  GLUCAP 170* 134* 219*         IMAGING x48h  - image(s) personally visualized  -   highlighted in bold No results found.   Resolved Hospital Problem list     Assessment & Plan:  Subarachnoid hemorrhage with intraventricular extension Subdural hemmorhage Hypertensive emergency  03/29/2019  - coming off cleviprex  PLAN Goal SBP 120-160 per neuro Hydralazine to 100mg  po tid Coreg to 25mg  bid Continue lisinopril Continue amlodipine cleviprex as needed after off  Dementia History of stroke Continue aricept, naemda, zoloft  Has suprapubic catheter Care per routine    Best practice:  Diet: dysphagia Pain/Anxiety/Delirium protocol (if indicated): n/a VAP protocol (if indicated): n/a DVT prophylaxis: SCD GI prophylaxis: PPI Glucose control: SSI Mobility: bed rest Code Status: DNR Family Communication: updated at length on 6/12, no 6/13 answer when I called, left message  Disposition: remain in ICU  Aim to move out of icu 03/30/2019  After ensuring off cleviprex for 12-24h    ATTESTATION & SIGNATURE   The patient [name] is critically ill with multiple organ systems failure and requires high complexity decision making for assessment and support, frequent evaluation and titration of therapies, application of advanced monitoring technologies and extensive interpretation of multiple databases.   Critical Care Time devoted to patient care services described in this note is  30  Minutes. This time  reflects time of care of this signee Dr Brand Males. This critical care time does not reflect procedure time, or teaching time or supervisory time of PA/NP/Med student/Med Resident etc but could involve care discussion time     Dr. Brand Males, M.D., [facility].C.P Pulmonary and Critical Care Medicine Staff Physician [place] [facility] Pager: [phone], If no answer or between  15:00h - 7:00h: call [phone]  03/29/2019 11:09 AM

## 2019-03-30 DIAGNOSIS — I609 Nontraumatic subarachnoid hemorrhage, unspecified: Secondary | ICD-10-CM

## 2019-03-30 LAB — GLUCOSE, CAPILLARY
Glucose-Capillary: 164 mg/dL — ABNORMAL HIGH (ref 70–99)
Glucose-Capillary: 201 mg/dL — ABNORMAL HIGH (ref 70–99)
Glucose-Capillary: 178 mg/dL — ABNORMAL HIGH (ref 70–99)
Glucose-Capillary: 162 mg/dL — ABNORMAL HIGH (ref 70–99)
Glucose-Capillary: 247 mg/dL — ABNORMAL HIGH (ref 70–99)

## 2019-03-30 NOTE — Progress Notes (Signed)
NAME:  [name], MRN:  [id number], DOB:  19-Dec-1940, LOS: 33 ADMISSION DATE:  03/20/2019, CONSULTATION DATE:  6/9 REFERRING MD:  [name], CHIEF COMPLAINT:  confusion   Brief History   78 y/o male admitted on 6/5 with concern for pneumonia, later developed confusion and found to have intracranial bleeding.   Past Medical History  DM2 HTN OSA Depression Dementia Hypertension HLD CHF Cerebral aneurysm SNF  Significant Hospital Events   6/05  Admit from SNF with sepsis in setting of aspiration PNA 6/08  Lethargy  6/08  Tx to ICU with [facility] / SDH 6/11 Palliative consultation> no escalation of care but continue medical management, DNR 6./13 - Transferred out of ICU overnight, but then moved back due to severely elevated BP (127 systolic) and placed on cleviprex again.    Consults:  PCCM Neurology Palliative  Procedures:   Significant Diagnostic Tests:  MRI 6/9 >> acute / subacute subarachnoid & subdural hemorrhage. Subdural blood most evident in the left sylvian fissure & prepontine space.  Subdural blood most evident posteriorly R>L.  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 CTA Head / Neck 6/9 >> intraventricular hemorrhage bilaterally with ventricular enlargement, likely due to acute hemorrhage, SAH in the sylvian fissures bilaterally, pattern not typical of aneurysm fupture, surgical clipping of R MCA aneurysm, atrophy with chronic microvascular ischemic changes   Micro Data:  Blood culture 6/5 > 1 of 2 corynebacterium SARS CoV2 6/5 >> negative  Antimicrobials:  Unasyn 6/5 >> 6/6 Azithro 6/7 >> 6/9  Zosyn 6/5 >> 6/9   Interim history/subjective:   Cleviprex has been off since 6/14 am No significant issues reported last 24h  Objective   Blood pressure (!) 174/75,  pulse 66, temperature 98.7 F (37.1 C), temperature source Axillary, resp. rate (!) 23, height 5\' 7"  (1.702 m), weight 84.1 kg, SpO2 94 %.        Intake/Output Summary (Last 24 hours) at 03/30/2019 1014 Last data filed at 03/30/2019 0900 Gross per 24 hour  Intake 903.2 ml  Output 1700 ml  Net -796.8 ml   Filed Weights   03/27/19 0500 03/27/19 1200 03/28/19 0030  Weight: 79.3 kg 78.2 kg 84.1 kg    General Appearance: Chronically ill-appearing, no distress Head: Normocephalic Eyes: Pupils equal, no icterus Throat: No upper airway noise or secretions evident Neck: No lymphadenopathy, no stridor Lungs: Decreased at both bases, otherwise clear, no wheezing Heart: Regular, no murmur Abdomen: Soft, nondistended, positive bowel sounds Extremities: Foot drop boots in place, no edema Skin: No rash Neurologic: Wakes easily.  Nodded to questions.  Moves his upper extremities.  Very slow to respond but does so.  Did not phonate for me    LABS    PULMONARY No results for input(s): PHART, PCO2ART, PO2ART, HCO3, TCO2, O2SAT in the last 168 hours.  Invalid input(s): PCO2, PO2  CBC Recent Labs  Lab 03/24/19 0723 03/25/19 0524  HGB 11.4* 11.3*  HCT 39.2 37.7*  WBC 13.0* 13.3*  PLT 262 307    COAGULATION Recent Labs  Lab 03/24/19 2143  INR 1.2    CARDIAC  No results for input(s): TROPONINI in the last 168 hours. No results for input(s): PROBNP in the last 168 hours.   CHEMISTRY Recent Labs  Lab 03/24/19 0723 03/25/19 0524 03/29/19 0601  NA 146* 143 136  K 3.4* 3.3* 3.0*  CL 107 105 98  CO2 30 26 26   GLUCOSE 100* 161* 225*  BUN 16 15 11   CREATININE 1.32* 1.05 0.88  CALCIUM 9.2 9.2 9.1  MG  --  2.0  --   PHOS  --  2.3*  --    Estimated Creatinine Clearance: 75.2 mL/min (by C-G formula based on SCr of 0.88 mg/dL).   LIVER Recent Labs  Lab 03/24/19 2143  INR 1.2     INFECTIOUS No results for input(s): LATICACIDVEN, PROCALCITON in the last 168 hours.    ENDOCRINE CBG (last 3)  Recent Labs    03/29/19 1641 03/29/19 2114 03/30/19 0759  GLUCAP 225* 171* 164*    Resolved Hospital Problem list     Assessment & Plan:  Subarachnoid hemorrhage with intraventricular extension Subdural hemmorhage Hypertensive emergency PLAN Cleviprex has been discontinued with blood pressure at goal, goal SBP 120-160 as per neurology recommendations Suspect that he did not receive all of his scheduled oral medications when transitioned out of the ICU as it is sometimes challenging to administer Continue hydralazine 100mg  3 times daily Carvedilol 25 twice daily Continue lisinopril, amlodipine as ordered  Dementia History of stroke Aricept, Namenda, Zoloft  Has suprapubic catheter Care per routine    Best practice:  Diet: dysphagia Pain/Anxiety/Delirium protocol (if indicated): n/a VAP protocol (if indicated): n/a DVT prophylaxis: SCD GI prophylaxis: PPI Glucose control: SSI Mobility: bed rest Code Status: DNR Family Communication: Palliative care to meet with family again on 6/15 Disposition: Transition out of the intensive care unit 6/15, plan to transition to internal medicine teaching service.   Baltazar Apo, MD, PhD 03/30/2019, 10:22 AM [facility] [phone] or if no answer [phone]

## 2019-03-30 NOTE — Progress Notes (Signed)
  Speech Language Pathology Treatment: Dysphagia  Patient Details Name: [name] MRN: [id number] DOB: Feb 26, 1941 Today's Date: 03/30/2019 Time: 1450-1505 SLP Time Calculation (min) (ACUTE ONLY): 15 min  Assessment / Plan / Recommendation Clinical Impression  Patient seen to address dysphagia goals with current diet consistencies of thin liquids and puree solids. Per RN, he has been eating most all of his meal trays with full supervision and feeding assistance. During today's session, patient drank thin liquids (water) via straw sips ate gelatin without overt s/s of aspiration or penetration, but continues with suspected delayed swallow initiation and prolonged bolus formation with puree solids/gelatin, etc. Patient had trace residuals in oral cavity post swallows but allowed for SLP to provide oral suctioning.     HPI HPI: 78yo male with PMH of hyperaldosteronism, HTN, T2DM, neurogenic bladder s/p suprapubic catheter, h/o CVA with left sided hemiparesis, vascular dementia presenting from [place] [facility] due to nonresolving pneumonia. CT head did not reveal any acute abnormality. MR Brain ordered on 6/8 but not completed at the time of this update to HPI.      SLP Plan  Continue with current plan of care       Recommendations  Diet recommendations: Dysphagia 1 (puree);Thin liquid Liquids provided via: Cup;Straw Medication Administration: Crushed with puree Supervision: Full supervision/cueing for compensatory strategies Compensations: Slow rate;Small sips/bites;Minimize environmental distractions Postural Changes and/or Swallow Maneuvers: Seated upright 90 degrees;Upright 30-60 min after meal                Oral Care Recommendations: Oral care BID Follow up Recommendations: Skilled Nursing facility SLP Visit Diagnosis: Dysphagia, oropharyngeal phase (R13.12) Plan: Continue with current plan of care       GO                Dannial Monarch 03/30/2019,  4:36 PM   Sonia Baller, MA, [place] [facility] Pager: [phone]

## 2019-03-30 NOTE — Progress Notes (Signed)
Patient ID: [name], male   DOB: 07/23/1941, 78 y.o.   MRN: [id number]  This NP visited patient at the bedside with [name] for palliative medicine  Needs, emotional support, and to re-meet with family for ongoing discussion regarding GOCs.  We had a continued conversation regarding diagnosis, prognosis, goals of care, end-of-life wishes, disposition and options.  As was discussed last week the focus of care is comfort and dignity.  Unfortunately the patient was transferred back to the ICU over the week-end.    We again discussed the  difference between a aggressive medical intervention path  and a palliative comfort care path for this patient at this time was had.  Values and goals of care important to patient and family were attempted to be elicited.    MOST form completed last week and continues to reflect plan of care for comfort.       Plan of Care: - DNR/DNI -no artifical feeding or hydration now or in the future, speech will  make diet recommendation, family understands the risk of aspiration, and the possibility that he will not be able to support himself nutrionally long term - continue mediations as tolerlated by mouth to treat chronic illnesses; ie BP meds--do not escalate level of care - Focus of care is comfort and dignity; avoid rehospitalization, no further diagnostics/labs, no further scans or x-rays -Transition patient to MedSurg floor for ongoing observation.  Transition of care will be dependent on outcomes;  SNF with hospice versus residential hospice.  (Prognosis will be dependent on patient's ability to tolerate oral intake) -Evaluate patient in 24 to 48 hours to make decision regarding transition of care   Questions and concerns addressed   Discussed with  Dr Malvin Johns  Total time spent on the unit was 35 minutes  Greater than 50% of the time was spent in counseling and coordination of care  Wadie Lessen NP  Palliative Medicine Team Team Phone # [phone] Pager [phone]

## 2019-03-31 DIAGNOSIS — J9601 Acute respiratory failure with hypoxia: Secondary | ICD-10-CM

## 2019-03-31 DIAGNOSIS — R1310 Dysphagia, unspecified: Secondary | ICD-10-CM

## 2019-03-31 LAB — GLUCOSE, CAPILLARY
Glucose-Capillary: 128 mg/dL — ABNORMAL HIGH (ref 70–99)
Glucose-Capillary: 232 mg/dL — ABNORMAL HIGH (ref 70–99)
Glucose-Capillary: 166 mg/dL — ABNORMAL HIGH (ref 70–99)
Glucose-Capillary: 189 mg/dL — ABNORMAL HIGH (ref 70–99)

## 2019-03-31 LAB — BASIC METABOLIC PANEL
Anion gap: 9 (ref 5–15)
BUN: 13 mg/dL (ref 8–23)
CO2: 30 mmol/L (ref 22–32)
Calcium: 9 mg/dL (ref 8.9–10.3)
Chloride: 96 mmol/L — ABNORMAL LOW (ref 98–111)
Creatinine, Ser: 0.99 mg/dL (ref 0.61–1.24)
GFR calc Af Amer: >60 mL/min (ref >60)
GFR calc non Af Amer: >60 mL/min (ref >60)
Glucose, Bld: 133 mg/dL — ABNORMAL HIGH (ref 70–99)
Potassium: 3.3 mmol/L — ABNORMAL LOW (ref 3.5–5.1)
Sodium: 135 mmol/L (ref 135–145)

## 2019-03-31 LAB — MAGNESIUM: Magnesium: 1.6 mg/dL — ABNORMAL LOW (ref 1.7–2.4)

## 2019-03-31 MED ORDER — MAGNESIUM SULFATE 2 GM/50ML IV SOLN
2.0000 g | Freq: Once | INTRAVENOUS | Status: AC
  Administered 2019-03-31: 2 g via INTRAVENOUS
  Filled 2019-03-31: qty 50

## 2019-03-31 MED ORDER — POTASSIUM CHLORIDE 20 MEQ PO PACK
40.0000 meq | PACK | Freq: Once | ORAL | Status: AC
  Administered 2019-03-31: 40 meq via ORAL
  Filled 2019-03-31: qty 2

## 2019-03-31 MED ORDER — CLONIDINE HCL 0.1 MG/24HR TD PTWK
0.1000 mg | MEDICATED_PATCH | TRANSDERMAL | Status: DC
  Administered 2019-03-31: 0.1 mg via TRANSDERMAL
  Filled 2019-03-31: qty 1

## 2019-03-31 NOTE — Progress Notes (Signed)
  Speech Language Pathology Treatment: Dysphagia  Patient Details Name: [name] MRN: [id number] DOB: 1941-04-16 Today's Date: 03/31/2019 Time: 1455-1510 SLP Time Calculation (min) (ACUTE ONLY): 15 min  Assessment / Plan / Recommendation Clinical Impression  Patient seen to address dysphagia goals with thin liquids. He continues to require frequency verbal and tactile cues for alertness and does not typically open eyes even when alerted. He consumed 8 ounces thin liquids via straw sips without any overt s/s of aspiration or penetration. He continues to exhibit suspected delayed swallow initiation. When asked what he wants to drink he replies "Scotch". Patient appears very eager to drink liquids, and pushes out lips when wanting more. Dys 1, thin liquids continues to be the safest PO diet for this patient.       HPI: 78yo male with PMH of hyperaldosteronism, HTN, T2DM, neurogenic bladder s/p suprapubic catheter, h/o CVA with left sided hemiparesis, vascular dementia presenting from [place] [facility] due to nonresolving pneumonia. CT head did not reveal any acute abnormality. MR Brain ordered on 6/8 but not completed at the time of this update to HPI.      SLP Plan  Discharge SLP treatment due to (comment);Other (Comment)(patient discharged secondary discharging to SNF with Hospice)       Recommendations  Diet recommendations: Dysphagia 1 (puree);Thin liquid Liquids provided via: Cup;Straw Medication Administration: Whole meds with puree Supervision: Full supervision/cueing for compensatory strategies;Trained caregiver to feed patient Compensations: Minimize environmental distractions;Small sips/bites;Slow rate Postural Changes and/or Swallow Maneuvers: Seated upright 90 degrees;Upright 30-60 min after meal                Oral Care Recommendations: Oral care BID Follow up Recommendations: Skilled Nursing facility SLP Visit Diagnosis: Dysphagia, oropharyngeal phase  (R13.12) Plan: Discharge SLP treatment due to (comment);Other (Comment)(patient discharged secondary discharging to SNF with Hospice)       [place], [name] 03/31/2019, 3:14 PM   Sonia Baller, MA, [place] [facility] Pager: [phone]

## 2019-03-31 NOTE — Progress Notes (Signed)
Patient ID: KOLBEY TEICHERT, male   DOB: 03-08-1941, 78 y.o.   MRN: [id number]  This NP visited patient at the bedside  for palliative medicine needs and  emotional support.  Patient appears comfortable, remains non-verbal and unable to follow commands, he continues to take sips and bites.    As discussed with family focus of care is comfort and dignity.    MOST form completed last week and continues to reflect plan of care for comfort.      Plan of Care: - DNR/DNI -no artifical feeding or hydration now or in the future, speech will  make diet recommendation, family understands the risk of aspiration, and the possibility that he will not be able to support himself nutrionally long term - continue mediations as tolerlated by mouth to treat chronic illnesses; ie BP meds--do not escalate level of care, no IV fluids or IV medications  - Focus of care is comfort and dignity; avoid rehospitalization, no further diagnostics/labs, no further scans or x-rays -  Transition to SNF with hospice    Prognosis is likely less than 3 months  Placed  call to daughter/[name] and left an update.  Left my contact information for her to call with questions or concerns.   Discussed with  Dr [name]  Total time spent on the unit was 35 minutes  Greater than 50% of the time was spent in counseling and coordination of care  Wadie Lessen NP  Palliative Medicine Team Team Phone # [phone] Pager [phone]

## 2019-03-31 NOTE — Care Management Important Message (Signed)
Important Message  Patient Details  Name: [name] MRN: [id number] Date of Birth: [birth year]   Medicare Important Message Given:  Yes    Orbie Pyo 03/31/2019, 1:37 PM

## 2019-03-31 NOTE — Progress Notes (Signed)
Transfer summary: Mr. Jewel is a 78yo male with PMH of HTN 2/2 hyperaldosteronism, T2DM, neurogenic bladder s/p suprapubic catheter, h/o CVA with left sided hemiparesis, vascular dementia who initially presented on 03/20/19 from his SNF [facility]) for nonresolving pneumonia. Patient was witnessed to have choked on food 2 days prior to admission and had been treated with aztreonam then ceftriaxone and flagyl, however he continued to spike fevers and had hypoxia prompting the transfer to [facility] for further evaluation. On admission, patient continued on treatment for aspiration pneumonia with unasyn, then had azithromycin added due to unresolving fevers. Patient then had worsening of his baseline mentation (could feed and occasionally answer simple questions, but otherwise dependent on care); MRI brain was obtained revealing diffuse intracranial bleeding. Patient was transferred to MICU for monitoring and BP management with Cleviprex drip. Patient has had slow progress but has been off of Cleviprex drip since 6/14, however still requiring PRN IV hydralazine for BP control. Palliative care is involved for discussion with family about goals of care. Patient has been made DNR and goals are directed towards comfort with decision to not pursue artificial feeding or aggressive treatment. Speech therapy has recommended dysphagia 1 diet; family is aware that he is still a very high aspiration risk and agree to continue feeds for comfort. Palliative care plans to continue evaluating patient over the next 24-48 hours to help delineate appropriate disposition, SNF with hospice vs residential hospice.  Diagnostic tests: MRI 6/9 >>acute / subacute subarachnoid &subdural hemorrhage. Subdural blood most evident in the left sylvian fissure &prepontine space. Subdural blood most evident posteriorly R>L. 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 CTA Head / Neck 6/9 >>intraventricular hemorrhage bilaterally with ventricular enlargement, likely due to acute hemorrhage, SAH in the sylvian fissures bilaterally, pattern not typical of aneurysm fupture, surgical clipping of R MCA aneurysm, atrophy with chronic microvascular ischemic changes   Significant events: 6/05 Admit from SNF with sepsis in setting of aspiration PNA 6/08 Lethargy  6/08 Tx to ICU with [facility] / SDH 6/11 Palliative consultation> no escalation of care but continue medical management, DNR 6/13 - Transferred out of ICU overnight, but then moved back due to severely elevated BP (509 systolic) and placed on cleviprex again.  6/15 - transfer out of ICU  Subjective: Patient is minimally verbal. Does not    Objective:  Vital signs in last 24 hours: Vitals:   03/30/19 2228 03/31/19 0532 03/31/19 0643 03/31/19 0659  BP: (!) 156/66 (!) 184/67 (!) 195/66 (!) 164/71  Pulse: 64 61  67  Resp: 18     Temp: 98.4 F (36.9 C)     TempSrc: Oral     SpO2: 95%     Weight:      Height:       Ph/E:  General: Appears comfortable Pulm: No respiratory distress, clear to auscultation on ant chest exam Abdomen: Soft and non tender Neuro: Alert, non verbal. Obey some of commands  Assessment/Plan:  Principal Problem:   Sepsis ([place]) Active Problems:   DNR (do not resuscitate) discussion   Healthcare-associated pneumonia   AKI (acute kidney injury) ([place])   Community acquired pneumonia   Hypertensive urgency   Aspiration pneumonia (HCC)   Somnolence   Intracranial bleeding ([place])   Palliative care by specialist   Lethargy   Subarachnoid hemorrhage   Adult failure to thrive  DNR (do not resuscitate)  Sepsis 2/2 aspiration PNA: Acute hypoxic respiratory failure: Received Azithromycin and Unazyn for aspiration PNA. Currently off of antibiotic.    Hypertesive urgency, SAH, SDH BP improved. Today BP 164/71 No indication for surgery for intra cranial bleeding. Remained minimaly verbal.  -Pt is DNR-DNI -Palliative care consulted and goal of care discussed with family and planned to focus on comfort care. -Ct with Lisinopril, Amlodipine, Clonidine patch, Torsemide, hydralazine for SBP goal of 140-160 -Hold anti Plt -Appreciate SW follow up for placement   CVA with residual left hemiparesia Dementia: -Continue with aricept, namenda, zoloft  -Hold anti plt  Diet: Dysphagia 1 IV fluid: None VTE ppx: SCDs Code status: DNR-DNI  Dispo: Anticipated discharge in 1-2 days  Alphonzo Grieve, MD 03/31/2019, 7:35 AM Pager [phone]

## 2019-03-31 NOTE — Progress Notes (Signed)
Occupational Therapy Treatment Patient Details Name: [name] MRN: [id number] DOB: Feb 06, 1941 Today's Date: 03/31/2019    History of present illness  Mr. Kessner is a 78yo male with PMH of hyperaldosteronism, HTN, T2DM, neurogenic bladder s/p suprapubic catheter, h/o CVA with left sided hemiparesis, vascular dementia presenting from [place] [facility] due to nonresolving pneumonia. Pt w/c bound and dependent for all ADLs.   OT comments  Session this date focused on maximizing pt's independence with self-feeding. Upon arrival NT cleaning pt, provided assistance to roll pt L<>R for completion of pericare. Pt engaged in self-feeding with built up utensil and maxA. RUE supported at elbow, OT scooped food and provided pt with utensil, pt required multi-modal cues and "take another bite" with extended time to initiate bringing spoon to mouth. Educated NT to encourage pt to participate in feeding as tolerated. Pt will continue to benefit from skilled OT services to maximize safety and independence with self-feeding and grooming. Will continue to follow acutely.    Follow Up Recommendations  SNF    Equipment Recommendations  None recommended by OT    Recommendations for Other Services      Precautions / Restrictions Precautions Precautions: Fall Precaution Comments: L hemiparesis Restrictions Weight Bearing Restrictions: No       Mobility Bed Mobility Overal bed mobility: Needs Assistance Bed Mobility: Rolling Rolling: Total assist         General bed mobility comments: roll for pericare  Transfers                 General transfer comment: Pt w/c bound PTA.  Unable to safely attempt     Balance                                           ADL either performed or assessed with clinical judgement   ADL Overall ADL's : Needs assistance/impaired Eating/Feeding: Maximal assistance;Sitting;Bed level Eating/Feeding Details (indicate cue type and  reason): pt engaged in self-feeding with builtup spoon and increased time for processing;supported RUE at elbow, scooped food on spoon for pt and prompted pt to "take a bite" pt would inconsistently follow command     Upper Body Bathing: Total assistance;Bed level   Lower Body Bathing: Total assistance;Bed level               Toileting- Clothing Manipulation and Hygiene: Total assistance;Bed level         General ADL Comments: upon arrival NT bathing pt;assisted NT to roll pt in bed for thorough pericare     Vision   Additional Comments: pt intermittently kept eyes closed during session, would open them with taking a bite and keep closed while chewing/drinking   Perception     Praxis      Cognition Arousal/Alertness: Lethargic Behavior During Therapy: Flat affect Overall Cognitive Status: No family/caregiver present to determine baseline cognitive functioning Area of Impairment: Attention                   Current Attention Level: Focused           General Comments: pt with limited verbal engagment with OT;requires increased time to process information;during feeding pt aske "what is it" referring to the mashed potatoes;pt would engage in self-feeding when prompted to "take another bite" with food in spoon        Exercises     Shoulder  Instructions       General Comments Pt with skin break down on bottom. RN and NT aware.     Pertinent Vitals/ Pain       Pain Assessment: Faces Faces Pain Scale: Hurts a little bit Pain Location: grimacing with pericare Pain Descriptors / Indicators: Grimacing Pain Intervention(s): Limited activity within patient's tolerance;Monitored during session  Home Living                                          Prior Functioning/Environment              Frequency  Min 2X/week        Progress Toward Goals  OT Goals(current goals can now be found in the care plan section)  Progress towards OT  goals: Progressing toward goals  Acute Rehab OT Goals Patient Stated Goal: pt did not state OT Goal Formulation: Patient unable to participate in goal setting Time For Goal Achievement: 04/09/19 Potential to Achieve Goals: Fair ADL Goals Pt Will Perform Eating: with mod assist;sitting;with adaptive utensils  Plan Discharge plan remains appropriate    Co-evaluation                 AM-PAC OT "6 Clicks" Daily Activity     Outcome Measure   Help from another person eating meals?: A Lot Help from another person taking care of personal grooming?: A Lot Help from another person toileting, which includes using toliet, bedpan, or urinal?: Total Help from another person bathing (including washing, rinsing, drying)?: Total Help from another person to put on and taking off regular upper body clothing?: Total Help from another person to put on and taking off regular lower body clothing?: Total 6 Click Score: 8    End of Session    OT Visit Diagnosis: Cognitive communication deficit (R41.841);Hemiplegia and hemiparesis Symptoms and signs involving cognitive functions: Cerebral infarction Hemiplegia - Right/Left: Left Hemiplegia - dominant/non-dominant: Non-Dominant   Activity Tolerance Patient tolerated treatment well   Patient Left in bed;with call bell/phone within reach;with bed alarm set   Nurse Communication Mobility status        Time: 8828-0034 OT Time Calculation (min): 41 min  Charges: OT General Charges $OT Visit: 1 Visit OT Treatments $Self Care/Home Management : 38-52 mins  Dorinda Hill OTR/L Acute Rehabilitation Services Office: [place] 03/31/2019, 1:38 PM

## 2019-04-01 LAB — GLUCOSE, CAPILLARY
Glucose-Capillary: 153 mg/dL — ABNORMAL HIGH (ref 70–99)
Glucose-Capillary: 201 mg/dL — ABNORMAL HIGH (ref 70–99)
Glucose-Capillary: 196 mg/dL — ABNORMAL HIGH (ref 70–99)
Glucose-Capillary: 186 mg/dL — ABNORMAL HIGH (ref 70–99)

## 2019-04-01 NOTE — Progress Notes (Signed)
   Subjective:  Patient is non verbal and does not answer our questions.   Objective:  Vital signs in last 24 hours: Vitals:   03/31/19 1626 03/31/19 2150 04/01/19 0030 04/01/19 0733  BP: (!) 182/61 (!) 126/97 (!) 181/64 (!) 158/67  Pulse: 63 62 60 61  Resp: 19 20 20 17   Temp: 99 F (37.2 C) 98.6 F (37 C) 97.6 F (36.4 C) 97.7 F (36.5 C)  TempSrc: Oral Oral Oral Oral  SpO2: 98% 100% 92% 100%  Weight:      Height:       General: Resting in bed with no distress. Appears comfortable. Arousable Pulm: No respiratory distress, clear to auscultation on ant chest exam Abdomen: Soft and non tender Neuro: Alert, non verbal.   Assessment/Plan:  Principal Problem:   Sepsis ([place]) Active Problems:   DNR (do not resuscitate) discussion   Healthcare-associated pneumonia   AKI (acute kidney injury) ([place])   Community acquired pneumonia   Hypertensive urgency   Aspiration pneumonia ([place])   Somnolence   Intracranial bleeding ([place])   Palliative care by specialist   Lethargy   Subarachnoid hemorrhage   Adult failure to thrive   DNR (do not resuscitate)  Sepsis 2/2 aspiration PNA: Acute hypoxic respiratory failure: Received Azithromycin and Unazyn for aspiration PNA. Currently off of antibiotic.   Hypertesive urgency, SAH, SDH BP ranges 140-180/60s-80s Palliative care consulted and goal of care discussed with family and planned to focus on comfort care. But will avoid escalating medications. Pt is DNR-DNI Awaitinbg SNf placement with Hospice.  Ct with chronic meds: Lisinopril, Amlodipine, Clonidine patch, Torsemide, hydralazine for SBP goal of 140-160. -Hold anti Plt -Appreciate SW follow up for placement  -COVID-19 sent for SNF placement  CVA with residual left hemiparesia Dementia: -Continue with aricept, namenda, zoloft  -Hold anti plt  Diet: Dysphagia 1 IV fluid: None VTE ppx: SCDs Code status: DNR-DNI  Dispo: Discharge pending SNF placement  Dewayne Hatch, MD 04/01/2019, 2:36 PM Pager: [phone]

## 2019-04-01 NOTE — Discharge Summary (Addendum)
Name: [name] MRN: [id number] DOB: 06/11/1941 78 y.o. PCP: Patient, No Pcp Per  Date of Admission: 03/20/2019  8:14 AM Date of Discharge: 04/02/2019 Attending Physician: Aldine Contes, MD  Discharge Diagnosis: 1. Principal Problem:   Sepsis ([place]) Active Problems:   DNR (do not resuscitate) discussion   Healthcare-associated pneumonia   AKI (acute kidney injury) ([place])   Community acquired pneumonia   Hypertensive urgency   Aspiration pneumonia ([place])   Somnolence   Intracranial bleeding ([place])   Palliative care by specialist   Lethargy   Subarachnoid hemorrhage   Adult failure to thrive   DNR (do not resuscitate)  Discharge Medications: Allergies as of 04/02/2019      Reactions   Pioglitazone Other (See Comments)   Edema   Rosiglitazone Maleate Other (See Comments)   edema      Medication List    STOP taking these medications   clopidogrel 75 MG tablet Commonly known as: PLAVIX   insulin glargine 100 UNIT/ML injection Commonly known as: LANTUS   insulin lispro 100 UNIT/ML injection Commonly known as: HUMALOG   LACTOBACILLUS PO   NUTRITIONAL SUPPLEMENT PO   Rocephin 1 g injection Generic drug: cefTRIAXone   simvastatin 20 MG tablet Commonly known as: ZOCOR     TAKE these medications   amLODipine 10 MG tablet Commonly known as: NORVASC Take 10 mg by mouth every morning.   carvedilol 12.5 MG tablet Commonly known as: COREG Take 1 tablet (12.5 mg total) by mouth 2 (two) times daily.   cloNIDine 0.1 mg/24hr patch Commonly known as: CATAPRES - Dosed in mg/24 hr Place 1 patch (0.1 mg total) onto the skin once a week. Start taking on: April 07, 2019   diclofenac sodium 1 % Gel Commonly known as: VOLTAREN Apply 4 g topically 4 (four) times daily. Apply to Bilateral Knees   DOCUSATE SODIUM PO Take 1 capsule by mouth every 12 (twelve) hours as needed.   donepezil 10 MG tablet Commonly known as: ARICEPT Take 1 tablet (10 mg total) by mouth  at bedtime.   hydrALAZINE 50 MG tablet Commonly known as: APRESOLINE Take 75 mg by mouth 4 (four) times daily.   ipratropium-albuterol 0.5-2.5 (3) MG/3ML Soln Commonly known as: DUONEB Take 1 mL by nebulization every 6 (six) hours as needed (shortness of breath).   lisinopril 40 MG tablet Commonly known as: ZESTRIL Take 40 mg by mouth daily.   memantine 28 MG Cp24 24 hr capsule Commonly known as: Namenda XR Take 1 capsule (28 mg total) by mouth daily.   metFORMIN 500 MG tablet Commonly known as: GLUCOPHAGE Take 500 mg by mouth daily.   mirabegron ER 50 MG Tb24 tablet Commonly known as: MYRBETRIQ Take 50 mg by mouth daily.   nitroGLYCERIN 0.4 MG SL tablet Commonly known as: NITROSTAT Place 0.4 mg under the tongue every 5 (five) minutes as needed for chest pain.   polyethylene glycol 17 g packet Commonly known as: MIRALAX / GLYCOLAX Take 17 g by mouth daily.   prednisoLONE acetate 1 % ophthalmic suspension Commonly known as: PRED FORTE Place 1 drop into the left eye 4 (four) times daily.   senna 8.6 MG tablet Commonly known as: SENOKOT Take 1 tablet by mouth every 12 (twelve) hours as needed for constipation.   sertraline 50 MG tablet Commonly known as: ZOLOFT Take 50 mg by mouth daily.   Systane 0.4-0.3 % Soln Generic drug: Polyethyl Glycol-Propyl Glycol Apply 1 drop to eye 2 (two) times daily.  Both eyes   tamsulosin 0.4 MG Caps capsule Commonly known as: FLOMAX Take 0.4 mg by mouth at bedtime.   tiZANidine 2 MG tablet Commonly known as: ZANAFLEX Take 2 mg by mouth 2 (two) times daily.   torsemide 10 MG tablet Commonly known as: DEMADEX Take 10 mg by mouth daily.   traMADol 50 MG tablet Commonly known as: ULTRAM Take 1 tablet (50 mg total) by mouth every 6 (six) hours as needed for up to 5 days for moderate pain or severe pain.   Vitamin D 125 MCG (5000 UT) Caps Take 5,000 Units by mouth daily.       Disposition and follow-up:   Mr.Donyell L  Dirr was discharged from [facility] in stable condition.  At the hospital follow up visit please address:  1.  Palliative care team discussed goal of care with family. Family focus of care, is comfort and dignity.  2. Since then, we continued current medications for chronic diseases but avoided escalating medications since goal of care changed to Hospice. 3. Speech therapy has recommended dysphagia 1 diet. Family is aware that he is still a very high aspiration risk and agree to continue feeds for comfort. Patient is discharged with regular diet with thin and soft food.  3. His BG was acceptable without any antihyperglycemic treatment. We DC home Insulin at discharge. Can resume if needed and if family prefer to do so.   Labs / imaging needed at time of follow-up: None  Pending labs/ test needing follow-up: None  Follow-up Appointments:  Hospital Course by problem list: 1. Sepsis and Acute hypoxic respiratory failure 2/2 aspiration pneumonia:  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 2 days before admission, he was witnessed to had choked on food and received Aztreonam and Ceftriaxone and Flagile before admission. Since he was still febrile, and as he developed hypoxia, he was transferred to [facility] for further management.  Here, he received complete course of Zosyn and Azithromycin for aspiration pneumonia. He then developed some mental status changes and found to have hypertensive urgency and intracranial bleeding.  2. SAH and SDH: likely traumatic Hypertensive crisis:  On day 4 of admission and despite antibiotic therapy, patient got lethargic and hypertensive. Further work up including brain MRI performed that showed acute/subacute  subarachnoid and subdural hemorrhage along with petechial parenchymal hemorrhage in the left sylvian fissure and left frontal operculum. He transferred to [facility] ICU. Neurosurgery did not recommend surgery. Per family members, he has had multiple falls before, and neurology commented as, his intracranial bleeding is likely traumatic and 2/2 falls. He stayed 2 days in ICU, His BP controled with Cleviprex gtt and he transferred back to internal medicine service. He had some initial no further changes mental status but unfortunately his mental status remained unchanged. He opens his eyes with voice but is nonverbal and does not obey commands. Palliative care discussed goal of care with family and they chose Hospice care for patient. Patient is now DNI DNR. Family does not want IV fluids nor IV medications nor artificial nutrition. Prior to discharge, we continued current PO anti HTN medications but avoided escalation of medication, IV medications or  At discharge, patient opens eyes to voice but is not verbal and does not answer questions. He rarely obey some of  commands.  Hx of CVA with residual left hemiparesia Dementia: Anti Plt held due to intracranial bleeding. Continued witharicept, namenda, zoloft.  Discharge Vitals:   BP (!) 172/69 (BP Location: Left Arm)   Pulse (!) 57   Temp 97.7 F (36.5 C) (Oral)   Resp 14   Ht 5\' 7"  (1.702 m)   Wt 84.1 kg   SpO2 95%   BMI 29.04 kg/m   Pertinent Labs, Studies, and Procedures:  Head CT 6/9 IMPRESSION: 1. Intraventricular hemorrhage bilaterally with ventricular enlargement compared with 2017. This is likely due to hydrocephalus given the acute hemorrhage. 2. Subarachnoid hemorrhage in the sylvian fissures bilaterally. No hemorrhage in the basilar cisterns. Hemorrhage pattern is not typical for aneurysm rupture and may be due to trauma or coagulopathy. 3. 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 4. Atrophy with chronic  microvascular ischemic change.  Brain MRI 03/24/2019  IMPRESSION: 1. Acute/subacute subarachnoid and subdural hemorrhage is described. Subdural blood is most evident in the left sylvian fissure and prepontine space. 2. Subdural blood is most evident posteriorly, right greater than left. 3. Layering blood products within the lateral ventricles bilaterally without hydrocephalus. 4. No significant midline shift. 5. Petechial parenchymal hemorrhage is present in the left frontal operculum and to a lesser extent posterior right frontal operculum. 6. Additional subarachnoid blood and possible parenchymal petechial hemorrhage is noted in the anteromedial frontal lobes bilaterally along the falx. 7. Multiple punctate foci of susceptibility suggesting underlying vasculitis. This could be the source of hemorrhage. Aneurysmal bleed is not excluded. 8. Right MCA aneurysm clip in place. 9. Susceptibility from the blood products without acute restricted diffusion, ischemic stroke.    CT angio neck 03/24/2019 IMPRESSION: 1. Intraventricular hemorrhage bilaterally with ventricular enlargement compared with 2017. This is likely due to hydrocephalus given the acute hemorrhage. 2. Subarachnoid hemorrhage in the sylvian fissures bilaterally. No hemorrhage in the basilar cisterns. Hemorrhage pattern is not typical for aneurysm rupture and may be due to trauma or coagulopathy. 3. 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 4. Atrophy with chronic microvascular ischemic change.  CBC Latest Ref Rng & Units 03/25/2019 03/24/2019 03/23/2019  WBC 4.0 - 10.5 K/uL 13.3(H) 13.0(H) 11.3(H)  Hemoglobin 13.0 - 17.0 g/dL 11.3(L) 11.4(L) 11.1(L)  Hematocrit 39.0 - 52.0 % 37.7(L) 39.2 37.6(L)  Platelets 150 - 400 K/uL 307 262 270   CMP Latest Ref Rng & Units 03/31/2019 03/29/2019 03/25/2019  Glucose 70 - 99 mg/dL 133(H) 225(H) 161(H)  BUN 8 - 23 mg/dL 13 11 15   Creatinine 0.61 - 1.24 mg/dL 0.99 0.88 1.05   Sodium 135 - 145 mmol/L 135 136 143  Potassium 3.5 - 5.1 mmol/L 3.3(L) 3.0(L) 3.3(L)  Chloride 98 - 111 mmol/L 96(L) 98 105  CO2 22 - 32 mmol/L 30 26 26   Calcium 8.9 - 10.3 mg/dL 9.0 9.1 9.2  Total Protein 6.5 - 8.1 g/dL - - -  Total Bilirubin 0.3 - 1.2 mg/dL - - -  Alkaline Phos 38 - 126 U/L - - -  AST 15 - 41 U/L - - -  ALT 0 - 44 U/L - - -   CXR on arrival 03/20/2019 IMPRESSION: Diffuse consolidation throughout the right lung compatible with pneumonia. Discharge Instructions: Discharge Instructions    Diet - low sodium heart healthy   Complete by: As directed    Discharge instructions   Complete by: As directed    For allowing [place] taking care of you at [facility].  We understand and respect your decision regarding goals of care, and discharge you to nursing facility to focus on hospice care for you per you and your family wishes.  We wish you the best. Can reach [place] at [phone] if you have any questions or concern. Thank you   Increase activity slowly   Complete by: As directed       Signed: Dewayne Hatch, MD 04/02/2019, 2:15 PM   Pager: [phone]

## 2019-04-01 NOTE — TOC Progression Note (Signed)
Transition of Care [facility]) - Progression Note    Patient Details  Name: [name] MRN: [id number] Date of Birth: 12/26/1940  Transition of Care [facility]) CM/SW Contact  Wandra Feinstein Alexis, [place] Phone Number: 04/01/2019, 11:19 AM  Clinical Narrative:   EMR reviewed. Per Palliative note, plan is for return to SNF with hospice services. Spoke to Tammy in admissions at [facility] who confirmed pt able to return with hospice but will need negative COVID-19 test result within 48 hours of admission to SNF.          Expected Discharge Plan and Services                                                 Social Determinants of Health (SDOH) Interventions    Readmission Risk Interventions No flowsheet data found.

## 2019-04-01 NOTE — Progress Notes (Signed)
Nutrition Brief Note  Chart reviewed. Pt now transitioning to comfort care per Palliative care note. No further nutrition interventions warranted at this time.   Clayton Bibles, MS, RD, [place] Dietitian Pager: [phone] After Hours Pager: [phone]

## 2019-04-02 LAB — NOVEL CORONAVIRUS, NAA (HOSP ORDER, SEND-OUT TO REF LAB; TAT 18-24 HRS): SARS-CoV-2, NAA: NOT DETECTED

## 2019-04-02 LAB — GLUCOSE, CAPILLARY
Glucose-Capillary: 148 mg/dL — ABNORMAL HIGH (ref 70–99)
Glucose-Capillary: 187 mg/dL — ABNORMAL HIGH (ref 70–99)

## 2019-04-02 MED ORDER — CLONIDINE 0.1 MG/24HR TD PTWK: 0.1000 mg | MEDICATED_PATCH | TRANSDERMAL | 12 refills | Status: AC

## 2019-04-02 MED ORDER — TRAMADOL HCL 50 MG PO TABS: 50.0000 mg | ORAL_TABLET | Freq: Four times a day (QID) | ORAL | 0 refills | Status: AC | PRN

## 2019-04-02 NOTE — TOC Transition Note (Signed)
Transition of Care [facility]) - CM/SW Discharge Note   Patient Details  Name: [name] MRN: [id number] Date of Birth: [birth year]  Transition of Care [facility]) CM/SW Contact:  Geralynn Ochs, LCSW Phone Number: 04/02/2019, 2:45 PM   Clinical Narrative:  Nurse to call report to [phone], Room 102     Final next level of care: Skilled Nursing Facility Barriers to Discharge: Barriers Resolved   Patient Goals and CMS Choice        Discharge Placement              Patient chooses bed at: [facility]) Patient to be transferred to facility by: [place] Name of family member notified: [name] Patient and family notified of of transfer: 04/02/19  Discharge Plan and Services                                     Social Determinants of Health (SDOH) Interventions     Readmission Risk Interventions No flowsheet data found.

## 2019-04-02 NOTE — Care Management Important Message (Signed)
Important Message  Patient Details  Name: [name] MRN: [id number] Date of Birth: September 15, 1941   Medicare Important Message Given:  Yes    Orbie Pyo 04/02/2019, 11:33 AM

## 2019-04-02 NOTE — Progress Notes (Signed)
   Subjective: Patient resting in bed in no acute distress.  He opens his eye to voices.  He is verbal today.  Objective:  Vital signs in last 24 hours: Vitals:   04/01/19 0733 04/01/19 1459 04/01/19 1715 04/02/19 0809  BP: (!) 158/67 (!) 176/69  (!) 172/69  Pulse: 61  75 (!) 57  Resp: 17   14  Temp: 97.7 F (36.5 C)   97.7 F (36.5 C)  TempSrc: Oral   Oral  SpO2: 100%   95%  Weight:      Height:       General: Resting in bed with no distress. Appears comfortable. Arousable Pulm: No respiratory distress, clear to auscultation on ant chest exam Abdomen: Soft and non tender Neuro: Alert, non verbal.  Assessment/Plan:  Principal Problem:   Sepsis ([place]) Active Problems:   DNR (do not resuscitate) discussion   Healthcare-associated pneumonia   AKI (acute kidney injury) ([place])   Community acquired pneumonia   Hypertensive urgency   Aspiration pneumonia ([place])   Somnolence   Intracranial bleeding ([place])   Palliative care by specialist   Lethargy   Subarachnoid hemorrhage   Adult failure to thrive   DNR (do not resuscitate)   Sepsis 2/2 aspiration PNA: Acute hypoxic respiratory failure: Resolved Received Azithromycin and Unazyn for aspiration PNA. Currently off of antibiotic. No evidence of reinfection  Hypertesive urgency, SAH, SDH BP ranges 50s-170s over 60s -Discussed charge to SNF to continue current meds. - avoid escalating medications considering being hospice care -Hold anti Plt -COVID-19 sent out test came back negative. -Discharge to SNF with hospice care today  CVA with residual left hemiparesia Dementia: -Continue witharicept, namenda, zoloft -Hold anti plt  Dispo: Anticipate discharge to SNF today  Dewayne Hatch, MD 04/02/2019, 2:27 PM Pager: [phone]

## 2019-04-02 NOTE — NC FL2 (Addendum)
[place] LEVEL OF CARE SCREENING TOOL     IDENTIFICATION  Patient Name: [name] Birthdate: [birth year] Sex: male Admission Date (Current Location): 03/20/2019  [facility] and [place] Number:  Herbalist and Address:  [facility] [place] [address], [place], [place] [address]      Provider Number: [id number]  Attending Physician Name and Address:  Aldine Contes, MD  Relative Name and Phone Number:       Current Level of Care: Hospital Recommended Level of Care: [place] Prior Approval Number:    Date Approved/Denied:   PASRR Number:    Discharge Plan: SNF    Current Diagnoses: Patient Active Problem List   Diagnosis Date Noted  . DNR (do not resuscitate)   . Subarachnoid hemorrhage 03/25/2019  . Adult failure to thrive   . Intracranial bleeding ([place])   . Palliative care by specialist   . Lethargy   . Somnolence   . Community acquired pneumonia 03/20/2019  . Sepsis ([place]) 03/20/2019  . Hypertensive urgency 03/20/2019  . Aspiration pneumonia ([place]) 03/20/2019  . Acute mid back pain 09/06/2017  . Bilateral primary osteoarthritis of knee 12/30/2016  . UTI (urinary tract infection) due to urinary indwelling catheter ([place]) 07/07/2017  . Suprapubic catheter dysfunction ([place]) 06/20/2017  . Loss of appetite 02/27/2017  . Hemiparesis of left dominant side due to cerebral infarction 09/02/2016  . Healthcare-associated pneumonia 08/29/2016  . AKI (acute kidney injury) ([place]) 08/29/2016  . Urinary retention   . Neurogenic bladder 07/14/2016  . DNR (do not resuscitate) discussion 04/18/2016  . Cerebral infarction ([place]) 03/26/2013  . Adjustment disorder with mixed anxiety and depressed mood 12/24/2012  . Vascular dementia ([place]) 10/24/2012  . Cerebral artery occlusion with cerebral infarction ([place]) 08/18/2008  . Hyperlipidemia associated with type 2 diabetes mellitus ([place]) 02/12/2008  . DEPENDENT EDEMA,  LEGS, BILATERAL 01/14/2007  . G E R D 10/28/2006  . HYPERALDOSTERONISM 10/16/2006  . Type II diabetes mellitus with neurological manifestations ([place]) 09/23/2006  . Essential hypertension 09/23/2006    Orientation RESPIRATION BLADDER Height & Weight     Self  Normal Incontinent Weight: 185 lb 6.5 oz (84.1 kg) Height:  5\' 7"  (170.2 cm)  BEHAVIORAL SYMPTOMS/MOOD NEUROLOGICAL BOWEL NUTRITION STATUS      Incontinent Diet(puree solids)  AMBULATORY STATUS COMMUNICATION OF NEEDS Skin   Total Care Verbally PU Stage and Appropriate Care PU Stage 1 Dressing: (butt wound, foam dressing change every 3 days)                     Personal Care Assistance Level of Assistance  Bathing, Feeding, Dressing Bathing Assistance: Maximum assistance Feeding assistance: Maximum assistance Dressing Assistance: Maximum assistance     Functional Limitations Info  Sight, Hearing, Speech Sight Info: Adequate Hearing Info: Adequate Speech Info: Impaired    SPECIAL CARE FACTORS FREQUENCY                       Contractures Contractures Info: Not present    Additional Factors Info  Code Status, Allergies, Psychotropic Code Status Info: DNR Allergies Info: Pioglitazone, Rosiglitazone Maleate Psychotropic Info: Aricept 10mg  daily at bed; Zoloft 50mg  daily         Current Medications (04/02/2019):  This is the current hospital active medication list Current Facility-Administered Medications  Medication Dose Route Frequency Provider Last Rate Last Dose  . 0.9 %  sodium chloride infusion   Intravenous PRN Byrum,  Rose Fillers, MD   Stopped at 03/27/19 1928  . acetaminophen (TYLENOL) tablet 650 mg  650 mg Oral Q6H PRN Collene Gobble, MD   650 mg at 03/27/19 1306   Or  . acetaminophen (TYLENOL) suppository 650 mg  650 mg Rectal Q6H PRN Collene Gobble, MD   650 mg at 03/23/19 1815  . amLODipine (NORVASC) tablet 10 mg  10 mg Oral Daily Collene Gobble, MD   10 mg at 04/02/19 0933  . carvedilol  (COREG) tablet 25 mg  25 mg Oral BID WC Collene Gobble, MD   25 mg at 04/02/19 0931  . chlorhexidine (PERIDEX) 0.12 % solution 15 mL  15 mL Mouth Rinse BID Collene Gobble, MD   15 mL at 04/02/19 0930  . Chlorhexidine Gluconate Cloth 2 % PADS 6 each  6 each Topical Daily Collene Gobble, MD   6 each at 04/01/19 1017  . cloNIDine (CATAPRES - Dosed in mg/24 hr) patch 0.1 mg  0.1 mg Transdermal Weekly Alphonzo Grieve, MD   0.1 mg at 03/31/19 1727  . donepezil (ARICEPT) tablet 10 mg  10 mg Oral QHS Collene Gobble, MD   10 mg at 04/01/19 2205  . Gerhardt's butt cream   Topical TID Collene Gobble, MD      . hydrALAZINE (APRESOLINE) tablet 100 mg  100 mg Oral Q8H Collene Gobble, MD   100 mg at 04/02/19 [phone]  . lisinopril (ZESTRIL) tablet 40 mg  40 mg Oral Daily Collene Gobble, MD   40 mg at 04/02/19 0932  . MEDLINE mouth rinse  15 mL Mouth Rinse q12n4p Collene Gobble, MD   15 mL at 04/01/19 1506  . memantine (NAMENDA XR) 24 hr capsule 28 mg  28 mg Oral Daily Collene Gobble, MD   28 mg at 04/02/19 0931  . mirabegron ER (MYRBETRIQ) tablet 50 mg  50 mg Oral Daily Collene Gobble, MD   50 mg at 04/02/19 0932  . polyethylene glycol (MIRALAX / GLYCOLAX) packet 17 g  17 g Oral Daily Collene Gobble, MD   17 g at 04/02/19 0930  . polyvinyl alcohol (LIQUIFILM TEARS) 1.4 % ophthalmic solution 1 drop  1 drop Both Eyes BID Collene Gobble, MD   1 drop at 04/02/19 0934  . prednisoLONE acetate (PRED FORTE) 1 % ophthalmic suspension 1 drop  1 drop Left Eye TID AC & HS Collene Gobble, MD   1 drop at 04/02/19 0933  . senna (SENOKOT) tablet 8.6 mg  1 tablet Oral Q12H PRN Collene Gobble, MD      . sertraline (ZOLOFT) tablet 50 mg  50 mg Oral Daily Collene Gobble, MD   50 mg at 04/02/19 0942  . torsemide (DEMADEX) tablet 20 mg  20 mg Oral Daily Collene Gobble, MD   20 mg at 04/02/19 8366     Discharge Medications: Please see discharge summary for a list of discharge medications.  Relevant Imaging  Results:  Relevant Lab Results:   Additional Information SS#: [id number]; Hospice to be set up at the facility  [facility], [place]

## 2019-04-27 ENCOUNTER — Encounter (INDEPENDENT_AMBULATORY_CARE_PROVIDER_SITE_OTHER): Payer: Medicare Other | Admitting: Ophthalmology

## 2019-07-16 DEATH — deceased

## 2020-07-15 DEATH — deceased
